# Patient Record
Sex: Male | Born: 1945 | Race: White | Hispanic: No | Marital: Married | Smoking: Current every day smoker
Health system: Southern US, Community
[De-identification: ages and names within clinical notes are randomized; demographics above are authoritative.]

## PROBLEM LIST (undated history)

## (undated) DIAGNOSIS — I2510 Atherosclerotic heart disease of native coronary artery without angina pectoris: Secondary | ICD-10-CM

## (undated) DIAGNOSIS — E785 Hyperlipidemia, unspecified: Secondary | ICD-10-CM

## (undated) DIAGNOSIS — R011 Cardiac murmur, unspecified: Secondary | ICD-10-CM

## (undated) DIAGNOSIS — I739 Peripheral vascular disease, unspecified: Secondary | ICD-10-CM

## (undated) DIAGNOSIS — R42 Dizziness and giddiness: Secondary | ICD-10-CM

## (undated) DIAGNOSIS — I10 Essential (primary) hypertension: Secondary | ICD-10-CM

## (undated) DIAGNOSIS — J449 Chronic obstructive pulmonary disease, unspecified: Secondary | ICD-10-CM

## (undated) DIAGNOSIS — R251 Tremor, unspecified: Secondary | ICD-10-CM

## (undated) DIAGNOSIS — M1990 Unspecified osteoarthritis, unspecified site: Secondary | ICD-10-CM

## (undated) DIAGNOSIS — T8859XA Other complications of anesthesia, initial encounter: Secondary | ICD-10-CM

## (undated) DIAGNOSIS — C61 Malignant neoplasm of prostate: Secondary | ICD-10-CM

## (undated) DIAGNOSIS — G629 Polyneuropathy, unspecified: Secondary | ICD-10-CM

## (undated) DIAGNOSIS — L570 Actinic keratosis: Secondary | ICD-10-CM

## (undated) HISTORY — DX: Actinic keratosis: L57.0

## (undated) HISTORY — DX: Dizziness and giddiness: R42

## (undated) HISTORY — DX: Tremor, unspecified: R25.1

## (undated) HISTORY — DX: Atherosclerotic heart disease of native coronary artery without angina pectoris: I25.10

## (undated) HISTORY — DX: Essential (primary) hypertension: I10

## (undated) HISTORY — PX: CORONARY ARTERY BYPASS GRAFT: SHX141

## (undated) HISTORY — DX: Unspecified osteoarthritis, unspecified site: M19.90

## (undated) HISTORY — DX: Peripheral vascular disease, unspecified: I73.9

## (undated) HISTORY — DX: Hyperlipidemia, unspecified: E78.5

## (undated) HISTORY — DX: Chronic obstructive pulmonary disease, unspecified: J44.9

## (undated) HISTORY — DX: Malignant neoplasm of prostate: C61

---

## 1998-11-06 ENCOUNTER — Ambulatory Visit (HOSPITAL_COMMUNITY): Admission: RE | Admit: 1998-11-06 | Discharge: 1998-11-06 | Payer: Self-pay | Admitting: Gastroenterology

## 1998-11-06 ENCOUNTER — Encounter: Payer: Self-pay | Admitting: Gastroenterology

## 2000-11-19 ENCOUNTER — Emergency Department (HOSPITAL_COMMUNITY): Admission: EM | Admit: 2000-11-19 | Discharge: 2000-11-19 | Payer: Self-pay | Admitting: Emergency Medicine

## 2000-11-19 ENCOUNTER — Encounter: Payer: Self-pay | Admitting: Emergency Medicine

## 2002-04-26 ENCOUNTER — Ambulatory Visit (HOSPITAL_COMMUNITY): Admission: RE | Admit: 2002-04-26 | Discharge: 2002-04-26 | Payer: Self-pay | Admitting: Pulmonary Disease

## 2002-04-26 ENCOUNTER — Encounter: Payer: Self-pay | Admitting: Pulmonary Disease

## 2002-06-20 ENCOUNTER — Inpatient Hospital Stay (HOSPITAL_COMMUNITY): Admission: AD | Admit: 2002-06-20 | Discharge: 2002-07-02 | Payer: Self-pay | Admitting: Internal Medicine

## 2002-06-20 ENCOUNTER — Encounter: Payer: Self-pay | Admitting: Internal Medicine

## 2002-06-27 ENCOUNTER — Encounter: Payer: Self-pay | Admitting: Cardiothoracic Surgery

## 2002-06-28 ENCOUNTER — Encounter: Payer: Self-pay | Admitting: Cardiothoracic Surgery

## 2002-06-29 ENCOUNTER — Encounter: Payer: Self-pay | Admitting: Cardiothoracic Surgery

## 2002-06-30 ENCOUNTER — Encounter: Payer: Self-pay | Admitting: Cardiothoracic Surgery

## 2002-10-02 ENCOUNTER — Emergency Department (HOSPITAL_COMMUNITY): Admission: EM | Admit: 2002-10-02 | Discharge: 2002-10-03 | Payer: Self-pay | Admitting: Emergency Medicine

## 2002-10-03 ENCOUNTER — Encounter: Payer: Self-pay | Admitting: Emergency Medicine

## 2002-10-19 ENCOUNTER — Encounter: Payer: Self-pay | Admitting: Emergency Medicine

## 2002-10-19 ENCOUNTER — Emergency Department (HOSPITAL_COMMUNITY): Admission: EM | Admit: 2002-10-19 | Discharge: 2002-10-19 | Payer: Self-pay | Admitting: Emergency Medicine

## 2003-12-15 ENCOUNTER — Ambulatory Visit (HOSPITAL_COMMUNITY): Admission: RE | Admit: 2003-12-15 | Discharge: 2003-12-15 | Payer: Self-pay | Admitting: Internal Medicine

## 2003-12-28 ENCOUNTER — Encounter: Admission: RE | Admit: 2003-12-28 | Discharge: 2003-12-28 | Payer: Self-pay | Admitting: Gastroenterology

## 2004-01-05 ENCOUNTER — Emergency Department (HOSPITAL_COMMUNITY): Admission: EM | Admit: 2004-01-05 | Discharge: 2004-01-05 | Payer: Self-pay | Admitting: Emergency Medicine

## 2004-01-31 ENCOUNTER — Ambulatory Visit (HOSPITAL_COMMUNITY): Admission: RE | Admit: 2004-01-31 | Discharge: 2004-01-31 | Payer: Self-pay | Admitting: Gastroenterology

## 2004-02-15 ENCOUNTER — Ambulatory Visit (HOSPITAL_COMMUNITY): Admission: RE | Admit: 2004-02-15 | Discharge: 2004-02-16 | Payer: Self-pay | Admitting: Neurosurgery

## 2004-03-04 ENCOUNTER — Encounter: Admission: RE | Admit: 2004-03-04 | Discharge: 2004-03-04 | Payer: Self-pay | Admitting: Neurosurgery

## 2004-07-07 ENCOUNTER — Ambulatory Visit: Payer: Self-pay | Admitting: Internal Medicine

## 2005-02-18 ENCOUNTER — Ambulatory Visit: Payer: Self-pay | Admitting: Cardiology

## 2005-06-24 ENCOUNTER — Ambulatory Visit: Payer: Self-pay | Admitting: Gastroenterology

## 2005-07-21 ENCOUNTER — Encounter (INDEPENDENT_AMBULATORY_CARE_PROVIDER_SITE_OTHER): Payer: Self-pay | Admitting: Specialist

## 2005-07-21 ENCOUNTER — Ambulatory Visit: Payer: Self-pay | Admitting: Gastroenterology

## 2005-08-05 ENCOUNTER — Ambulatory Visit: Payer: Self-pay | Admitting: Cardiology

## 2005-10-01 ENCOUNTER — Ambulatory Visit: Payer: Self-pay | Admitting: Endocrinology

## 2005-10-12 ENCOUNTER — Ambulatory Visit: Payer: Self-pay | Admitting: Internal Medicine

## 2005-10-21 ENCOUNTER — Ambulatory Visit: Payer: Self-pay | Admitting: Internal Medicine

## 2006-02-10 ENCOUNTER — Ambulatory Visit: Payer: Self-pay | Admitting: Internal Medicine

## 2006-02-25 ENCOUNTER — Ambulatory Visit: Payer: Self-pay | Admitting: Internal Medicine

## 2006-03-20 ENCOUNTER — Emergency Department (HOSPITAL_COMMUNITY): Admission: EM | Admit: 2006-03-20 | Discharge: 2006-03-20 | Payer: Self-pay | Admitting: Emergency Medicine

## 2006-04-08 ENCOUNTER — Ambulatory Visit: Payer: Self-pay | Admitting: Internal Medicine

## 2006-07-01 ENCOUNTER — Ambulatory Visit: Payer: Self-pay | Admitting: Internal Medicine

## 2006-08-11 ENCOUNTER — Ambulatory Visit: Payer: Self-pay | Admitting: Cardiology

## 2006-10-04 ENCOUNTER — Ambulatory Visit: Payer: Self-pay | Admitting: Internal Medicine

## 2007-01-05 ENCOUNTER — Ambulatory Visit: Payer: Self-pay | Admitting: Internal Medicine

## 2007-01-05 ENCOUNTER — Ambulatory Visit: Payer: Self-pay | Admitting: Gastroenterology

## 2007-01-05 LAB — CONVERTED CEMR LAB
AST: 21 units/L (ref 0–37)
BUN: 9 mg/dL (ref 6–23)
CO2: 29 meq/L (ref 19–32)
Calcium: 9.5 mg/dL (ref 8.4–10.5)
Chloride: 109 meq/L (ref 96–112)
Cholesterol: 242 mg/dL (ref 0–200)
Creatinine, Ser: 1 mg/dL (ref 0.4–1.5)
Direct LDL: 184.3 mg/dL
GFR calc non Af Amer: 82 mL/min
HDL: 33.9 mg/dL — ABNORMAL LOW (ref >39.0)
PSA: 1.88 ng/mL (ref 0.10–4.00)
Potassium: 4.6 meq/L (ref 3.5–5.1)
TSH: 1.04 microintl units/mL (ref 0.35–5.50)
Triglycerides: 180 mg/dL — ABNORMAL HIGH (ref 0–149)
VLDL: 36 mg/dL (ref 0–40)

## 2007-01-07 ENCOUNTER — Ambulatory Visit: Payer: Self-pay | Admitting: Internal Medicine

## 2007-04-27 ENCOUNTER — Ambulatory Visit: Payer: Self-pay | Admitting: Cardiology

## 2007-05-04 ENCOUNTER — Ambulatory Visit: Payer: Self-pay

## 2007-05-11 ENCOUNTER — Encounter: Payer: Self-pay | Admitting: *Deleted

## 2007-05-11 DIAGNOSIS — J309 Allergic rhinitis, unspecified: Secondary | ICD-10-CM | POA: Insufficient documentation

## 2007-05-11 DIAGNOSIS — J449 Chronic obstructive pulmonary disease, unspecified: Secondary | ICD-10-CM

## 2007-05-11 DIAGNOSIS — M1990 Unspecified osteoarthritis, unspecified site: Secondary | ICD-10-CM | POA: Insufficient documentation

## 2007-05-16 ENCOUNTER — Ambulatory Visit: Payer: Self-pay | Admitting: Internal Medicine

## 2007-05-18 ENCOUNTER — Ambulatory Visit: Payer: Self-pay

## 2007-06-30 ENCOUNTER — Ambulatory Visit: Payer: Self-pay | Admitting: Internal Medicine

## 2007-06-30 ENCOUNTER — Telehealth: Payer: Self-pay | Admitting: Internal Medicine

## 2007-06-30 DIAGNOSIS — L570 Actinic keratosis: Secondary | ICD-10-CM

## 2007-06-30 DIAGNOSIS — J209 Acute bronchitis, unspecified: Secondary | ICD-10-CM

## 2007-09-12 ENCOUNTER — Ambulatory Visit: Payer: Self-pay | Admitting: Internal Medicine

## 2007-09-12 DIAGNOSIS — R0989 Other specified symptoms and signs involving the circulatory and respiratory systems: Secondary | ICD-10-CM

## 2007-09-12 DIAGNOSIS — G250 Essential tremor: Secondary | ICD-10-CM

## 2007-09-12 DIAGNOSIS — I739 Peripheral vascular disease, unspecified: Secondary | ICD-10-CM | POA: Insufficient documentation

## 2007-09-12 DIAGNOSIS — G5600 Carpal tunnel syndrome, unspecified upper limb: Secondary | ICD-10-CM

## 2007-09-12 DIAGNOSIS — R0609 Other forms of dyspnea: Secondary | ICD-10-CM

## 2007-09-12 DIAGNOSIS — R42 Dizziness and giddiness: Secondary | ICD-10-CM

## 2007-09-12 DIAGNOSIS — I2510 Atherosclerotic heart disease of native coronary artery without angina pectoris: Secondary | ICD-10-CM

## 2007-10-10 ENCOUNTER — Ambulatory Visit: Payer: Self-pay | Admitting: Internal Medicine

## 2007-10-10 DIAGNOSIS — F411 Generalized anxiety disorder: Secondary | ICD-10-CM | POA: Insufficient documentation

## 2007-10-28 ENCOUNTER — Ambulatory Visit: Payer: Self-pay | Admitting: Cardiology

## 2007-11-09 ENCOUNTER — Ambulatory Visit: Payer: Self-pay

## 2007-12-12 ENCOUNTER — Encounter (INDEPENDENT_AMBULATORY_CARE_PROVIDER_SITE_OTHER): Payer: Self-pay | Admitting: *Deleted

## 2007-12-12 ENCOUNTER — Ambulatory Visit: Payer: Self-pay | Admitting: Internal Medicine

## 2007-12-12 DIAGNOSIS — Z860100 Personal history of colon polyps, unspecified: Secondary | ICD-10-CM | POA: Insufficient documentation

## 2008-01-02 ENCOUNTER — Ambulatory Visit: Payer: Self-pay | Admitting: Internal Medicine

## 2008-01-02 DIAGNOSIS — K625 Hemorrhage of anus and rectum: Secondary | ICD-10-CM

## 2008-01-02 DIAGNOSIS — R198 Other specified symptoms and signs involving the digestive system and abdomen: Secondary | ICD-10-CM

## 2008-01-24 ENCOUNTER — Encounter: Payer: Self-pay | Admitting: Internal Medicine

## 2008-01-24 ENCOUNTER — Ambulatory Visit: Payer: Self-pay | Admitting: Internal Medicine

## 2008-01-29 ENCOUNTER — Encounter: Payer: Self-pay | Admitting: Internal Medicine

## 2008-03-02 ENCOUNTER — Encounter: Payer: Self-pay | Admitting: Internal Medicine

## 2008-03-09 ENCOUNTER — Ambulatory Visit: Payer: Self-pay | Admitting: Internal Medicine

## 2008-03-09 LAB — CONVERTED CEMR LAB
ALT: 28 units/L (ref 0–53)
AST: 23 units/L (ref 0–37)
Albumin: 3.8 g/dL (ref 3.5–5.2)
Basophils Absolute: 0.1 10*3/uL (ref 0.0–0.1)
Basophils Relative: 1.1 % (ref 0.0–3.0)
Bilirubin Urine: NEGATIVE
Chloride: 103 meq/L (ref 96–112)
Eosinophils Absolute: 0.3 10*3/uL (ref 0.0–0.7)
Eosinophils Relative: 3.8 % (ref 0.0–5.0)
Folate: 7.2 ng/mL
GFR calc Af Amer: 88 mL/min
GFR calc non Af Amer: 73 mL/min
HDL: 32.1 mg/dL — ABNORMAL LOW (ref >39.0)
LDL Cholesterol: 141 mg/dL — ABNORMAL HIGH (ref 0–99)
Lymphocytes Relative: 32.4 % (ref 12.0–46.0)
MCV: 91.9 fL (ref 78.0–100.0)
Monocytes Relative: 8.2 % (ref 3.0–12.0)
Neutrophils Relative %: 54.5 % (ref 43.0–77.0)
Nitrite: NEGATIVE
RBC: 4.91 M/uL (ref 4.22–5.81)
RDW: 12.3 % (ref 11.5–14.6)
Sodium: 137 meq/L (ref 135–145)
Specific Gravity, Urine: >=1.03 (ref 1.000–1.03)
TSH: 0.44 microintl units/mL (ref 0.35–5.50)
Total Bilirubin: 1.1 mg/dL (ref 0.3–1.2)
Total Protein: 6.9 g/dL (ref 6.0–8.3)
Triglycerides: 103 mg/dL (ref 0–149)
Urine Glucose: NEGATIVE mg/dL
Urobilinogen, UA: 0.2 (ref 0.0–1.0)
Vit D, 1,25-Dihydroxy: 25 — ABNORMAL LOW (ref 30–89)

## 2008-03-13 ENCOUNTER — Ambulatory Visit: Payer: Self-pay | Admitting: Internal Medicine

## 2008-03-13 DIAGNOSIS — E559 Vitamin D deficiency, unspecified: Secondary | ICD-10-CM | POA: Insufficient documentation

## 2008-03-13 DIAGNOSIS — M2550 Pain in unspecified joint: Secondary | ICD-10-CM

## 2008-06-18 ENCOUNTER — Ambulatory Visit: Payer: Self-pay | Admitting: Internal Medicine

## 2008-06-20 ENCOUNTER — Ambulatory Visit: Payer: Self-pay | Admitting: Cardiology

## 2008-06-21 LAB — CONVERTED CEMR LAB
Calcium: 8.9 mg/dL (ref 8.4–10.5)
Chloride: 106 meq/L (ref 96–112)
Creatinine, Ser: 1.1 mg/dL (ref 0.4–1.5)
Sodium: 139 meq/L (ref 135–145)

## 2008-10-15 ENCOUNTER — Ambulatory Visit: Payer: Self-pay | Admitting: Internal Medicine

## 2008-10-15 DIAGNOSIS — I10 Essential (primary) hypertension: Secondary | ICD-10-CM

## 2008-10-17 LAB — CONVERTED CEMR LAB
Basophils Relative: 0.6 % (ref 0.0–3.0)
CO2: 27 meq/L (ref 19–32)
Creatinine, Ser: 1 mg/dL (ref 0.4–1.5)
Eosinophils Relative: 2.8 % (ref 0.0–5.0)
GFR calc non Af Amer: 81 mL/min
Glucose, Bld: 104 mg/dL — ABNORMAL HIGH (ref 70–99)
HCT: 44.4 % (ref 39.0–52.0)
Hemoglobin: 15.7 g/dL (ref 13.0–17.0)
Lymphocytes Relative: 33.7 % (ref 12.0–46.0)
MCHC: 35.5 g/dL (ref 30.0–36.0)
MCV: 93 fL (ref 78.0–100.0)
Monocytes Relative: 6.9 % (ref 3.0–12.0)
Neutrophils Relative %: 56 % (ref 43.0–77.0)
RBC: 4.77 M/uL (ref 4.22–5.81)
RDW: 12.5 % (ref 11.5–14.6)
Sodium: 137 meq/L (ref 135–145)
Vitamin B-12: 291 pg/mL (ref 211–911)

## 2008-10-23 ENCOUNTER — Encounter: Payer: Self-pay | Admitting: Internal Medicine

## 2009-01-17 ENCOUNTER — Telehealth: Payer: Self-pay | Admitting: Cardiology

## 2009-02-18 ENCOUNTER — Ambulatory Visit: Payer: Self-pay | Admitting: Internal Medicine

## 2009-02-18 LAB — CONVERTED CEMR LAB
ALT: 25 units/L (ref 0–53)
Chloride: 108 meq/L (ref 96–112)
Cholesterol: 191 mg/dL (ref 0–200)
GFR calc non Af Amer: 80.84 mL/min (ref >60)
Glucose, Bld: 102 mg/dL — ABNORMAL HIGH (ref 70–99)
LDL Cholesterol: 136 mg/dL — ABNORMAL HIGH (ref 0–99)
Potassium: 4.2 meq/L (ref 3.5–5.1)
Total CHOL/HDL Ratio: 6
VLDL: 21.2 mg/dL (ref 0.0–40.0)
Vit D, 25-Hydroxy: 33 ng/mL (ref 30–89)

## 2009-02-21 ENCOUNTER — Ambulatory Visit: Payer: Self-pay | Admitting: Internal Medicine

## 2009-02-21 DIAGNOSIS — E538 Deficiency of other specified B group vitamins: Secondary | ICD-10-CM

## 2009-07-03 ENCOUNTER — Ambulatory Visit: Payer: Self-pay | Admitting: Internal Medicine

## 2009-07-03 LAB — CONVERTED CEMR LAB
Albumin: 3.8 g/dL (ref 3.5–5.2)
Alkaline Phosphatase: 55 units/L (ref 39–117)
BUN: 8 mg/dL (ref 6–23)
CO2: 24 meq/L (ref 19–32)
Calcium: 8.9 mg/dL (ref 8.4–10.5)
GFR calc non Af Amer: 80.74 mL/min (ref >60)
Sodium: 137 meq/L (ref 135–145)
TSH: 0.36 microintl units/mL (ref 0.35–5.50)

## 2009-07-08 ENCOUNTER — Ambulatory Visit: Payer: Self-pay | Admitting: Internal Medicine

## 2009-07-08 DIAGNOSIS — Z87891 Personal history of nicotine dependence: Secondary | ICD-10-CM

## 2009-10-04 ENCOUNTER — Ambulatory Visit: Payer: Self-pay | Admitting: Internal Medicine

## 2009-10-04 LAB — CONVERTED CEMR LAB
ALT: 27 units/L (ref 0–53)
Albumin: 4.1 g/dL (ref 3.5–5.2)
Creatinine, Ser: 0.9 mg/dL (ref 0.4–1.5)
Eosinophils Relative: 2.6 % (ref 0.0–5.0)
Glucose, Bld: 93 mg/dL (ref 70–99)
HCT: 46.5 % (ref 39.0–52.0)
HDL: 40.4 mg/dL (ref >39.00)
Hemoglobin: 16.2 g/dL (ref 13.0–17.0)
MCHC: 34.7 g/dL (ref 30.0–36.0)
MCV: 94.9 fL (ref 78.0–100.0)
Monocytes Relative: 6.4 % (ref 3.0–12.0)
Neutrophils Relative %: 54.4 % (ref 43.0–77.0)
Platelets: 260 10*3/uL (ref 150.0–400.0)
RBC: 4.9 M/uL (ref 4.22–5.81)
Sodium: 137 meq/L (ref 135–145)
Total Bilirubin: 1.1 mg/dL (ref 0.3–1.2)
Total Protein: 7 g/dL (ref 6.0–8.3)
Triglycerides: 93 mg/dL (ref 0.0–149.0)
VLDL: 18.6 mg/dL (ref 0.0–40.0)
WBC: 6 10*3/uL (ref 4.5–10.5)

## 2009-10-07 ENCOUNTER — Ambulatory Visit: Payer: Self-pay | Admitting: Internal Medicine

## 2009-10-07 DIAGNOSIS — E291 Testicular hypofunction: Secondary | ICD-10-CM | POA: Insufficient documentation

## 2010-01-08 ENCOUNTER — Ambulatory Visit: Payer: Self-pay | Admitting: Internal Medicine

## 2010-01-09 LAB — CONVERTED CEMR LAB
ALT: 28 units/L (ref 0–53)
AST: 19 units/L (ref 0–37)
Alkaline Phosphatase: 50 units/L (ref 39–117)
Bilirubin, Direct: 0.1 mg/dL (ref 0.0–0.3)
CO2: 27 meq/L (ref 19–32)
Creatinine, Ser: 1 mg/dL (ref 0.4–1.5)
Hgb A1c MFr Bld: 5.7 % (ref 4.6–6.5)
Potassium: 4.7 meq/L (ref 3.5–5.1)
Sodium: 140 meq/L (ref 135–145)
TSH: 1.23 microintl units/mL (ref 0.35–5.50)
Total Bilirubin: 0.5 mg/dL (ref 0.3–1.2)
Total Protein: 6.8 g/dL (ref 6.0–8.3)
Vitamin B-12: 472 pg/mL (ref 211–911)

## 2010-05-05 ENCOUNTER — Ambulatory Visit: Payer: Self-pay | Admitting: Internal Medicine

## 2010-05-05 LAB — CONVERTED CEMR LAB
ALT: 29 units/L (ref 0–53)
AST: 24 units/L (ref 0–37)
Albumin: 4.1 g/dL (ref 3.5–5.2)
Alkaline Phosphatase: 49 units/L (ref 39–117)
BUN: 14 mg/dL (ref 6–23)
Basophils Absolute: 0 10*3/uL (ref 0.0–0.1)
Basophils Relative: 0.6 % (ref 0.0–3.0)
Bilirubin, Direct: 0.1 mg/dL (ref 0.0–0.3)
CO2: 28 meq/L (ref 19–32)
Calcium: 9.8 mg/dL (ref 8.4–10.5)
Chloride: 104 meq/L (ref 96–112)
Creatinine, Ser: 1 mg/dL (ref 0.4–1.5)
Eosinophils Absolute: 0.2 10*3/uL (ref 0.0–0.7)
Eosinophils Relative: 2.8 % (ref 0.0–5.0)
GFR calc non Af Amer: 77.82 mL/min (ref >60)
Glucose, Bld: 93 mg/dL (ref 70–99)
HCT: 44.4 % (ref 39.0–52.0)
Hemoglobin: 15.8 g/dL (ref 13.0–17.0)
Lymphocytes Relative: 37.4 % (ref 12.0–46.0)
Lymphs Abs: 2.8 10*3/uL (ref 0.7–4.0)
MCHC: 35.4 g/dL (ref 30.0–36.0)
MCV: 94.3 fL (ref 78.0–100.0)
Monocytes Absolute: 0.6 10*3/uL (ref 0.1–1.0)
Monocytes Relative: 7.9 % (ref 3.0–12.0)
Neutro Abs: 3.8 10*3/uL (ref 1.4–7.7)
Neutrophils Relative %: 51.3 % (ref 43.0–77.0)
Platelets: 236 10*3/uL (ref 150.0–400.0)
Potassium: 4.8 meq/L (ref 3.5–5.1)
RBC: 4.72 M/uL (ref 4.22–5.81)
RDW: 13.6 % (ref 11.5–14.6)
Sodium: 139 meq/L (ref 135–145)
Total Bilirubin: 1 mg/dL (ref 0.3–1.2)
Total Protein: 6.9 g/dL (ref 6.0–8.3)
Vit D, 25-Hydroxy: 40 ng/mL (ref 30–89)
Vitamin B-12: 640 pg/mL (ref 211–911)
WBC: 7.4 10*3/uL (ref 4.5–10.5)

## 2010-06-08 ENCOUNTER — Encounter: Payer: Self-pay | Admitting: Internal Medicine

## 2010-06-11 ENCOUNTER — Telehealth: Payer: Self-pay | Admitting: Internal Medicine

## 2010-06-19 ENCOUNTER — Encounter: Payer: Self-pay | Admitting: Internal Medicine

## 2010-06-19 ENCOUNTER — Ambulatory Visit: Payer: Self-pay | Admitting: Internal Medicine

## 2010-06-19 DIAGNOSIS — R209 Unspecified disturbances of skin sensation: Secondary | ICD-10-CM

## 2010-06-19 DIAGNOSIS — R51 Headache: Secondary | ICD-10-CM | POA: Insufficient documentation

## 2010-06-19 DIAGNOSIS — M25539 Pain in unspecified wrist: Secondary | ICD-10-CM

## 2010-06-19 DIAGNOSIS — S060X9A Concussion with loss of consciousness of unspecified duration, initial encounter: Secondary | ICD-10-CM | POA: Insufficient documentation

## 2010-06-19 DIAGNOSIS — M79609 Pain in unspecified limb: Secondary | ICD-10-CM | POA: Insufficient documentation

## 2010-06-19 DIAGNOSIS — R519 Headache, unspecified: Secondary | ICD-10-CM | POA: Insufficient documentation

## 2010-06-19 DIAGNOSIS — M542 Cervicalgia: Secondary | ICD-10-CM | POA: Insufficient documentation

## 2010-06-20 ENCOUNTER — Encounter: Payer: Self-pay | Admitting: Internal Medicine

## 2010-06-24 ENCOUNTER — Encounter: Payer: Self-pay | Admitting: Internal Medicine

## 2010-07-16 ENCOUNTER — Ambulatory Visit: Payer: Self-pay | Admitting: Internal Medicine

## 2010-07-16 DIAGNOSIS — M25519 Pain in unspecified shoulder: Secondary | ICD-10-CM

## 2010-07-28 ENCOUNTER — Encounter: Payer: Self-pay | Admitting: Internal Medicine

## 2010-08-01 ENCOUNTER — Ambulatory Visit: Payer: Self-pay | Admitting: Internal Medicine

## 2010-08-08 ENCOUNTER — Ambulatory Visit: Payer: Self-pay | Admitting: Internal Medicine

## 2010-08-08 DIAGNOSIS — L259 Unspecified contact dermatitis, unspecified cause: Secondary | ICD-10-CM

## 2010-08-08 DIAGNOSIS — D485 Neoplasm of uncertain behavior of skin: Secondary | ICD-10-CM

## 2010-08-28 ENCOUNTER — Ambulatory Visit
Admission: RE | Admit: 2010-08-28 | Discharge: 2010-08-28 | Payer: Self-pay | Source: Home / Self Care | Attending: Internal Medicine | Admitting: Internal Medicine

## 2010-09-25 NOTE — Letter (Signed)
Summary: [facility]  [facility]   Imported By: Lennie Odor 07/09/2010 11:16:21  _____________________________________________________________________  External Attachment:    Type:   Image     Comment:   External Document
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
## 2010-09-25 NOTE — Assessment & Plan Note (Signed)
Summary: 3 MO ROV /NWS  #   Vital Signs:  Patient profile:   65 year old male Weight:      248 pounds Temp:     98 degrees F oral Pulse rate:   85 / minute BP sitting:   106 / 60  (left arm)  Vitals Entered By: Tora Perches (October 07, 2009 8:15 AM) CC: f/u Is Patient Diabetic? No   Primary Care Provider:  Ransom Nickson  CC:  f/u.  History of Present Illness: The patient presents for a follow up of hypertension, OA, hyperlipidemia   Preventive Screening-Counseling & Management  Alcohol-Tobacco     Smoking Status: quit  Current Medications (verified): 1)  Benicar 20 Mg  Tabs (Olmesartan Medoxomil) .... Take One Tablet Daily 2)  Aciphex 20 Mg  Tbec (Rabeprazole Sodium) .... Take One Tablet Once Daily 3)  Combivent 103-18 Mcg/act  Aero (Albuterol-Ipratropium) .... Use As Directed 4)  Zyrtec Allergy 10 Mg  Tabs (Cetirizine Hcl) .... Take As Needed 5)  Meclizine Hcl 25 Mg  Tabs (Meclizine Hcl) .... Take 1/2-3 Tabs By Mouth Qd 6)  Adult Aspirin Low Strength 81 Mg  Tbdp (Aspirin) .... Take 1 By Mouth Qd 7)  Diazepam 5 Mg Tabs (Diazepam) .... 1/2 or 1 By Mouth Two Times A Day - Three Times A Day As Needed Tremor or Insomnia 8)  Vitamin B-6 500 Mg Tabs (Pyridoxine Hcl) .... Once Daily 9)  Vitamin D3 1000 Unit  Tabs (Cholecalciferol) .[name].. 1 By Mouth Daily 10)  Norvasc 5 Mg Tabs (Amlodipine Besylate) .... Once Daily 11)  Vitamin B-12 1000 Mcg Tabs (Cyanocobalamin) .[name].. 1 By Mouth Qd 12)  Miralax   Powd (Polyethylene Glycol 3350) .[name].. 17g By Mouth Once Daily As Needed Constipation 13)  Ibuprofen 600 Mg  Tabs (Ibuprofen) .[name].. 1 By Mouth Two Times A Day As Needed Pain  Allergies: 1)  ! Pepcid 2)  ! * Nexium 3)  ! Ticlid 4)  ! * Flu Vaccination 5)  ! Axid 6)  ! Lipitor 7)  ! 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 OSA/snorring Colonic polyps, hx of  Dr Leone Payor Vit D def Low Vit B12 2009 Hypertension Migraines w/aura  Social History: Last updated: 01/02/2008 Retired Married Current Smoker Patient has been counseled to quit.  Alcohol Use - no  Physical Exam  General:  overweight-appearing.   Nose:  External nasal examination shows no deformity or inflammation. Nasal mucosa are pink and moist without lesions or exudates. Mouth:  Oral mucosa and oropharynx without lesions or exudates.  Teeth in good repair. Heart:  Normal rate and regular rhythm. S1 and S2 normal without gallop, murmur, click, rub or other extra sounds.   Impression & Recommendations:  Problem # 1:  HYPERTENSION (ICD-401.9)  His updated medication list for this problem includes:    Benicar 20 Mg Tabs (Olmesartan medoxomil) .[name]... Take one tablet daily    Norvasc 5 Mg Tabs (Amlodipine besylate) ..... Once daily  Problem # 2:  HYPOGONADISM (ICD-257.2) Assessment: Comment Only Discussed. Not interested in Rx now - will try to loose wt  Problem # 3:  ARTHRALGIA (ICD-719.40) Assessment: Unchanged  On prescription therapy   Problem # 4:  ANXIETY (ICD-300.00) Assessment: Unchanged  His updated medication list for this problem includes:    Diazepam 5 Mg Tabs (Diazepam) .[name]... 1/2 or 1 by mouth two times a day - three times a day as needed tremor or insomnia  Problem # 5:  OSTEOARTHRITIS (ICD-715.90) Assessment: Unchanged  His updated medication list for this problem includes:    Adult Aspirin Low Strength 81 Mg Tbdp (Aspirin) .[name]... Take 1 by mouth qd    Ibuprofen 600 Mg Tabs (Ibuprofen) .[name]... 1 by mouth two times a day as needed pain  Complete Medication List: 1)  Benicar 20 Mg Tabs (Olmesartan medoxomil) .... Take  one tablet daily 2)  Combivent 103-18 Mcg/act Aero (Albuterol-ipratropium) .... Use as directed 3)  Zyrtec Allergy 10 Mg Tabs (Cetirizine hcl) .... Take as needed 4)  Meclizine Hcl 25 Mg Tabs (Meclizine hcl) .... Take 1/2-3 tabs by mouth qd 5)  Adult Aspirin Low Strength 81 Mg Tbdp (Aspirin) .... Take 1 by mouth qd 6)  Diazepam 5 Mg Tabs (Diazepam) .... 1/2 or 1 by mouth two times a day - three times a day as needed tremor or insomnia 7)  Vitamin B-6 500 Mg Tabs (Pyridoxine hcl) .... Once daily 8)  Vitamin D3 1000 Unit Tabs (Cholecalciferol) .[name].. 1 by mouth daily 9)  Norvasc 5 Mg Tabs (Amlodipine besylate) .... Once daily 10)  Vitamin B-12 1000 Mcg Tabs (Cyanocobalamin) .[name].. 1 by mouth qd 11)  Miralax Powd (Polyethylene glycol 3350) .[name].. 17g by mouth once daily as needed constipation 12)  Ibuprofen 600 Mg Tabs (Ibuprofen) .[name].. 1 by mouth two times a day as needed pain 13)  Ranitidine Hcl 300 Mg Tabs (Ranitidine hcl) .[name].. 1 by mouth once daily for indigestion  Patient Instructions: 1)  Please schedule a follow-up appointment in 3 months. 2)  BMP prior to visit, ICD-9: 3)  testost 4)  Vit B12 266.20 995.20  780.79  Contraindications/Deferment of Procedures/Staging:    Test/Procedure: FLU VAX    Reason for deferment: patient declined     Test/Procedure: Pneumovax vaccine    Reason for deferment: patient declined  Prescriptions: RANITIDINE HCL 300 MG TABS (RANITIDINE HCL) 1 by mouth once daily for indigestion  #90 x 3   Entered and Authorized by:   Tresa Garter MD   Signed by:   Tresa Garter MD on 10/08/2009   Method used:   Electronically to        [facility]  [address] [phone]* (retail)       [address]       [place], [place]  [address]       Ph: [phone] or [phone]       Fax: [phone]   RxID:   [id number]
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
## 2010-09-25 NOTE — Assessment & Plan Note (Signed)
Summary: SKIN BX/NWS  #   Vital Signs:  Patient profile:   65 year old male Height:      69 inches Weight:      271 pounds BMI:     40.16 Temp:     97.8 degrees F oral Pulse rate:   80 / minute Pulse rhythm:   regular Resp:     16 per minute BP sitting:   140 / 60  (left arm) Cuff size:   regular  Vitals Entered By: Lanier Prude, CMA(AAMA) (August 28, 2010 9:23 AM)  Procedure Note Last Tetanus: Tdap (06/19/2010)  Mole Biopsy/Removal: The patient complains of changing mole. Indication: suspicious lesion Consent signed: yes  Procedure # 1: shave biopsy    Size (in cm): 0.5 x 0.4    Region: anterior    Location: under L eye    Comment: Risks including but not limited by incomplete procedure, bleeding, infection, recurrence were discussed with the patient. Consent form was signed. Tolerated well. Complicatons - none.     Instrument used: dermablade    Anesthesia: 0.5 ml 1% lidocaine w/epinephrine  Procedure # 2: shave biopsy    Size (in cm): 0.6 x 0.5    Region: lateral    Location: R temple    Comment: The wounds were treated aggressively w/a hyfercator    Instrument used: same    Anesthesia: 0.5 ml 1% lidocaine w/epinephrine  Cleaned and prepped with: alcohol and betadine Wound dressing: neosporin and bandaid Instructions: daily dressing changes  CC: multiple skin lesions Is Patient Diabetic? No   Primary Care Provider:  Liron Eissler  CC:  multiple skin lesions.  History of Present Illness: He is here for skin biopsy and cryo  Current Medications (verified): 1)  Benicar 20 Mg  Tabs (Olmesartan Medoxomil) .... Take One Tablet Daily 2)  Norvasc 5 Mg Tabs (Amlodipine Besylate) .... Once Daily 3)  Combivent 103-18 Mcg/act  Aero (Albuterol-Ipratropium) .... Use As Directed 4)  Zyrtec Allergy 10 Mg  Tabs (Cetirizine Hcl) .... Take As Needed 5)  Meclizine Hcl 25 Mg  Tabs (Meclizine Hcl) .... Take 1/2-3 Tabs By Mouth Qd 6)  Ranitidine Hcl 300 Mg Tabs (Ranitidine  Hcl) .[name].. 1 By Mouth Once Daily For Indigestion 7)  Adult Aspirin Low Strength 81 Mg  Tbdp (Aspirin) .... Take 1 By Mouth Qd 8)  Vitamin D3 1000 Unit  Tabs (Cholecalciferol) .[name].. 1 By Mouth Daily 9)  Vitamin B-12 1000 Mcg Tabs (Cyanocobalamin) .... 1.5 Tabs  By Mouth Once Daily 10)  Vitamin B-6 500 Mg Tabs (Pyridoxine Hcl) .... Once Daily 11)  Ibuprofen 600 Mg  Tabs (Ibuprofen) .[name].. 1 By Mouth Two Times A Day As Needed Pain 12)  Diazepam 5 Mg Tabs (Diazepam) .... 1/2 or 1 By Mouth Two Times A Day - Three Times A Day As Needed Tremor or Insomnia 13)  Tramadol Hcl 50 Mg Tabs (Tramadol Hcl) .[name].. 1 Once Daily As Needed Pain 14)  Amitiza 24 Mcg Caps (Lubiprostone) .[name].. 1 By Mouth Once Daily As Needed Constipation 15)  Triamcinolone Acetonide 0.5 % Crea (Triamcinolone Acetonide) .... Use Two Times A Day Prn  Allergies (verified): 1)  ! Pepcid 2)  ! * Nexium 3)  ! Ticlid 4)  ! * Flu Vaccination 5)  ! Axid 6)  ! Lipitor 7)  ! * Vytorin 8)  Allegra-D 24 Hour 9)  Codeine Phosphate (Codeine Phosphate) 10)  Nabumetone (Nabumetone) 11)  Primidone (Primidone) 12)  Hydrocodone 13)  Oxycodone Hcl  Physical Exam  General:  NAD, overweight-appearing.   Skin:  AKs on L hand, forearm and the scull skin 2 moles on face   Impression & Recommendations:  Problem # 1:  NEOPLASM OF UNCERTAIN BEHAVIOR OF SKIN (ICD-238.2) Assessment New  Orders: Shave Skin Lesion 0.6-1.0cm face/ears/eyelids/nose/lips/mm (11311) Shave Skin Lesion < 0.5cm face/ears/eyelids/nose/lips/mm (11310)  Problem # 2:  ACTINIC KERATOSIS (ICD-702.0) Assessment: New  Procedure: cryo Indication: AK(s) Risks incl. scar(s), incomplete removal, ect.  and benefits discussed      2 lesion(s) on L dorsal hand, 1 on L dorsal forearm and 2 on the scalp was/were treated with liqid N2 in usual fasion.  Tolerated well. Compl. none. Wound care instructions given.   Orders: Cryotherapy/Destruction benign or premalignant lesion (1st  lesion)  (17000) Cryotherapy/Destruction benign or premalignant lesion (2nd-14th lesions) (17003)  Complete Medication List: 1)  Benicar 20 Mg Tabs (Olmesartan medoxomil) .... Take one tablet daily 2)  Norvasc 5 Mg Tabs (Amlodipine besylate) .... Once daily 3)  Combivent 103-18 Mcg/act Aero (Albuterol-ipratropium) .... Use as directed 4)  Zyrtec Allergy 10 Mg Tabs (Cetirizine hcl) .... Take as needed 5)  Meclizine Hcl 25 Mg Tabs (Meclizine hcl) .... Take 1/2-3 tabs by mouth qd 6)  Ranitidine Hcl 300 Mg Tabs (Ranitidine hcl) .[name].. 1 by mouth once daily for indigestion 7)  Adult Aspirin Low Strength 81 Mg Tbdp (Aspirin) .... Take 1 by mouth qd 8)  Vitamin D3 1000 Unit Tabs (Cholecalciferol) .[name].. 1 by mouth daily 9)  Vitamin B-12 1000 Mcg Tabs (Cyanocobalamin) .... 1.5 tabs  by mouth once daily 10)  Vitamin B-6 500 Mg Tabs (Pyridoxine hcl) .... Once daily 11)  Ibuprofen 600 Mg Tabs (Ibuprofen) .[name].. 1 by mouth two times a day as needed pain 12)  Diazepam 5 Mg Tabs (Diazepam) .... 1/2 or 1 by mouth two times a day - three times a day as needed tremor or insomnia 13)  Tramadol Hcl 50 Mg Tabs (Tramadol hcl) .[name].. 1 once daily as needed pain 14)  Amitiza 24 Mcg Caps (Lubiprostone) .[name].. 1 by mouth once daily as needed constipation 15)  Triamcinolone Acetonide 0.5 % Crea (Triamcinolone acetonide) .... Use two times a day prn   Orders Added: 1)  Shave Skin Lesion 0.6-1.0cm face/ears/eyelids/nose/lips/mm [11311] 2)  Shave Skin Lesion < 0.5cm face/ears/eyelids/nose/lips/mm [11310] 3)  Cryotherapy/Destruction benign or premalignant lesion (1st lesion)  [17000] 4)  Cryotherapy/Destruction benign or premalignant lesion (2nd-14th lesions) [17003]
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
## 2010-09-25 NOTE — Progress Notes (Signed)
  Phone Note Call from Patient Call back at Home Phone [phone]   Summary of Call: Patient wants a call back, left vm regarding medical records request.  Initial call taken by: Lamar Sprinkles, CMA,  June 11, 2010 1:58 PM  Follow-up for Phone Call        Left detailed vm for pt that he must sign release form to have records sent Follow-up by: Lamar Sprinkles, CMA,  June 11, 2010 5:21 PM

## 2010-09-25 NOTE — Consult Note (Signed)
Summary: [facility]  [facility]   Imported By: Lester Strathmere 07/08/2010 10:18:27  _____________________________________________________________________  External Attachment:    Type:   Image     Comment:   External Document

## 2010-09-25 NOTE — Miscellaneous (Signed)
Summary: Multiple Skin Bx/[facility]  Multiple Skin Bx/[facility]   Imported By: Sherian Rein 09/02/2010 14:50:57  _____________________________________________________________________  External Attachment:    Type:   Image     Comment:   External Document

## 2010-09-25 NOTE — Assessment & Plan Note (Signed)
Summary: 3 MO ROV /NWS  #  RS'D PER PT/NWS   Vital Signs:  Patient profile:   65 year old male Height:      69 inches Weight:      258 pounds BMI:     38.24 O2 Sat:      94 % on Room air Temp:     97.3 degrees F oral Pulse rate:   74 / minute Pulse rhythm:   regular Resp:     16 per minute BP sitting:   108 / 66  (left arm) Cuff size:   regular  Vitals Entered By: Lanier Prude, CMA(AAMA) (May 05, 2010 7:58 AM)  O2 Flow:  Room air CC: f/u c/o constipation, joint pain and Lt knee swelling Is Patient Diabetic? No Comments pt is not taking Primidone 50 mg   Primary Care Provider:  Plotnikov  CC:  f/u c/o constipation and joint pain and Lt knee swelling.  History of Present Illness: The patient presents for a follow up of hypertension, OA, LBP, hyperlipidemia   Current Medications (verified): 1)  Benicar 20 Mg  Tabs (Olmesartan Medoxomil) .... Take One Tablet Daily 2)  Norvasc 5 Mg Tabs (Amlodipine Besylate) .... Once Daily 3)  Combivent 103-18 Mcg/act  Aero (Albuterol-Ipratropium) .... Use As Directed 4)  Zyrtec Allergy 10 Mg  Tabs (Cetirizine Hcl) .... Take As Needed 5)  Meclizine Hcl 25 Mg  Tabs (Meclizine Hcl) .... Take 1/2-3 Tabs By Mouth Qd 6)  Ranitidine Hcl 300 Mg Tabs (Ranitidine Hcl) .[name].. 1 By Mouth Once Daily For Indigestion 7)  Adult Aspirin Low Strength 81 Mg  Tbdp (Aspirin) .... Take 1 By Mouth Qd 8)  Vitamin D3 1000 Unit  Tabs (Cholecalciferol) .[name].. 1 By Mouth Daily 9)  Vitamin B-12 1000 Mcg Tabs (Cyanocobalamin) .... 1.5 Tabs  By Mouth Once Daily 10)  Vitamin B-6 500 Mg Tabs (Pyridoxine Hcl) .... Once Daily 11)  Ibuprofen 600 Mg  Tabs (Ibuprofen) .[name].. 1 By Mouth Two Times A Day As Needed Pain 12)  Diazepam 5 Mg Tabs (Diazepam) .... 1/2 or 1 By Mouth Two Times A Day - Three Times A Day As Needed Tremor or Insomnia 13)  Tramadol Hcl 50 Mg Tabs (Tramadol Hcl) .[name].. 1 Once Daily As Needed Pain 14)  Primidone 50 Mg Tabs (Primidone) .... Start With 1/2 Tab  At Bedtime, Then Take 1 At Bedtime If Tolerated For Tremor  Allergies (verified): 1)  ! Pepcid 2)  ! * Nexium 3)  ! Ticlid 4)  ! * Flu Vaccination 5)  ! Axid 6)  ! Lipitor 7)  ! 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 OSA/snorring Colonic polyps, hx of  Dr Leone Payor Vit D def Low Vit B12 2009 Hypertension Migraines w/aura Tremor  Social History: Last updated: 01/02/2008 Retired Married Current Smoker Patient has been counseled to quit.  Alcohol Use - no  Review of Systems  The patient denies fever, weight loss, weight gain, chest pain, abdominal pain, melena, and depression.    Physical Exam  General:  overweight-appearing.   Nose:  External nasal examination shows no deformity or inflammation. Nasal mucosa are pink and moist without lesions or exudates. Mouth:  Oral mucosa and oropharynx without lesions  or exudates.  Teeth in good repair. Lungs:  Normal respiratory effort, chest expands symmetrically. Lungs are clear to auscultation, no crackles or wheezes. Heart:  Normal rate and regular rhythm. S1 and S2 normal without gallop, murmur, click, rub or other extra sounds. Abdomen:  Bowel sounds positive,abdomen soft and non-tender without masses, organomegaly or hernias noted. Msk:  Lumbar-sacral spine is tender to palpation over paraspinal muscles and painfull with the ROM   B knees tender w/ROM Extremities:  No edema B Neurologic:  No cranial nerve deficits noted. Station and gait are normal. Plantar reflexes are down-going bilaterally. DTRs are symmetrical throughout. Sensory, motor and coordinative functions appear intact. Tremor is  present Skin:  Intact without suspicious lesions or rashes Psych:  Oriented X3.  normally interactive and good eye contact.     Impression & Recommendations:  Problem # 1:  HYPERTENSION (ICD-401.9) Assessment Improved  His updated medication list for this problem includes:    Benicar 20 Mg Tabs (Olmesartan medoxomil) .[name]... Take one tablet daily    Norvasc 5 Mg Tabs (Amlodipine besylate) ..... Once daily  BP today: 108/66 Prior BP: 142/72 (01/08/2010)  Labs Reviewed: K+: 4.7 (01/08/2010) Creat: : 1.0 (01/08/2010)   Chol: 206 (10/04/2009)   HDL: 40.40 (10/04/2009)   LDL: 136 (02/18/2009)   TG: 93.0 (10/04/2009)  Orders: T-Vitamin D (25-Hydroxy) (63875-64332) TLB-B12, Serum-Total ONLY (95188-C16) TLB-BMP (Basic Metabolic Panel-BMET) (80048-METABOL) TLB-CBC Platelet - w/Differential (85025-CBCD) TLB-Hepatic/Liver Function Pnl (80076-HEPATIC)  Problem # 2:  VITAMIN B12 DEFICIENCY (ICD-266.2) Assessment: Unchanged  On the regimen of medicine(s) reflected in the chart    Orders: T-Vitamin D (25-Hydroxy) (60630-16010) TLB-B12, Serum-Total ONLY (93235-T73) TLB-BMP (Basic Metabolic Panel-BMET) (80048-METABOL) TLB-CBC Platelet - w/Differential (85025-CBCD) TLB-Hepatic/Liver Function Pnl (80076-HEPATIC)  Problem # 3:  ARTHRALGIA (ICD-719.40) Assessment: Unchanged On the regimen of medicine(s) reflected in the chart    Problem # 4:  CORONARY ARTERY DISEASE (ICD-414.00) Assessment: Unchanged  His updated medication list for this problem includes:    Benicar 20 Mg Tabs (Olmesartan medoxomil) .[name]... Take one tablet daily    Norvasc 5 Mg Tabs (Amlodipine besylate) ..... Once daily    Adult Aspirin Low Strength 81 Mg Tbdp (Aspirin) .[name]... Take 1 by mouth qd  Problem # 5:  TREMOR, ESSENTIAL (ICD-333.1) Assessment: Unchanged D/c Primidone de to side effects  Complete Medication List: 1)  Benicar 20 Mg Tabs (Olmesartan medoxomil) .... Take one tablet daily 2)  Norvasc 5 Mg Tabs  (Amlodipine besylate) .... Once daily 3)  Combivent 103-18 Mcg/act Aero (Albuterol-ipratropium) .... Use as directed 4)  Zyrtec Allergy 10 Mg Tabs (Cetirizine hcl) .... Take as needed 5)  Meclizine Hcl 25 Mg Tabs (Meclizine hcl) .... Take 1/2-3 tabs by mouth qd 6)  Ranitidine Hcl 300 Mg Tabs (Ranitidine hcl) .[name].. 1 by mouth once daily for indigestion 7)  Adult Aspirin Low Strength 81 Mg Tbdp (Aspirin) .... Take 1 by mouth qd 8)  Vitamin D3 1000 Unit Tabs (Cholecalciferol) .[name].. 1 by mouth daily 9)  Vitamin B-12 1000 Mcg Tabs (Cyanocobalamin) .... 1.5 tabs  by mouth once daily 10)  Vitamin B-6 500 Mg Tabs (Pyridoxine hcl) .... Once daily 11)  Ibuprofen 600 Mg Tabs (Ibuprofen) .[name].. 1 by mouth two times a day as needed pain 12)  Diazepam 5 Mg Tabs (Diazepam) .... 1/2 or 1 by mouth two times a day - three times a day as needed tremor or insomnia 13)  Tramadol Hcl 50 Mg Tabs (Tramadol hcl) .[name].. 1 once daily as needed pain  Other Orders: Flu Vaccine 30yrs + MEDICARE PATIENTS (U9811) Administration Flu vaccine - MCR (B1478)  Patient Instructions: 1)  Please schedule a follow-up appointment in 3 months well w/labs.  Marland Kitchenlbmedflu   Flu Vaccine Consent Questions     Do you have a history of severe allergic reactions to this vaccine? no    Any prior history of allergic reactions to egg and/or gelatin? no    Do you have a sensitivity to the preservative Thimersol? no    Do you have a past history of Guillan-Barre Syndrome? no    Do you currently have an acute febrile illness? no    Have you ever had a severe reaction to latex? no    Vaccine information given and explained to patient? yes    Are you currently pregnant? no    Lot Number:[id number]   Exp Date:02/21/2011   Site: Left Deltoid IM Lanier Prude, [facility])  May 05, 2010 8:36 AM

## 2010-09-25 NOTE — Assessment & Plan Note (Signed)
Summary: 3 MTH FU  STC   Vital Signs:  Patient profile:   65 year old male Weight:      258 pounds BMI:     38.24 Pulse rate:   62 / minute BP sitting:   142 / 72  (left arm) Cuff size:   regular  Vitals Entered By: Lamar Sprinkles, CMA (Jan 08, 2010 8:02 AM) CC: 3 mth f/u. [place]are[place] Seat off a house recently, only c/o soreness and bruising. Also had "food posioning" w/in the past week. Does not take valium, wants to talk about xanax.   Comments Pt states he has rx for tramadol, causes sleepyness but takes as needed pain. Tramadol removed from allergy list.   Primary Care Provider:  Javier Mamone  CC:  3 mth f/u. [place] Seat off a house recently, only c/o soreness and bruising. Also had "food posioning" w/in the past week. Does not take valium, and wants to talk about xanax.  [name]  History of Present Illness: The patient presents for a follow up of hypertension, diabetes, hyperlipidemia C/o fall 16 ft 2 wks ago cleaning gutters - hit his back and head - no LOC He had food poisoning the other wk C/o CTS and trembling  Current Medications (verified): 1)  Benicar 20 Mg  Tabs (Olmesartan Medoxomil) .... Take One Tablet Daily 2)  Norvasc 5 Mg Tabs (Amlodipine Besylate) .... Once Daily 3)  Combivent 103-18 Mcg/act  Aero (Albuterol-Ipratropium) .... Use As Directed 4)  Zyrtec Allergy 10 Mg  Tabs (Cetirizine Hcl) .... Take As Needed 5)  Meclizine Hcl 25 Mg  Tabs (Meclizine Hcl) .... Take 1/2-3 Tabs By Mouth Qd 6)  Ranitidine Hcl 300 Mg Tabs (Ranitidine Hcl) .[name].. 1 By Mouth Once Daily For Indigestion 7)  Adult Aspirin Low Strength 81 Mg  Tbdp (Aspirin) .... Take 1 By Mouth Qd 8)  Vitamin D3 1000 Unit  Tabs (Cholecalciferol) .[name].. 1 By Mouth Daily 9)  Vitamin B-12 1000 Mcg Tabs (Cyanocobalamin) .... 1.5 Tabs  By Mouth Once Daily 10)  Vitamin B-6 500 Mg Tabs (Pyridoxine Hcl) .... Once Daily 11)  Ibuprofen 600 Mg  Tabs (Ibuprofen) .[name].. 1 By Mouth Two Times A Day As Needed Pain 12)  Diazepam 5 Mg Tabs (Diazepam) ....  1/2 or 1 By Mouth Two Times A Day - Three Times A Day As Needed Tremor or Insomnia 13)  Tramadol Hcl 50 Mg Tabs (Tramadol Hcl) .[name].. 1 Once Daily As Needed Pain  Allergies: 1)  ! Pepcid 2)  ! * Nexium 3)  ! Ticlid 4)  ! * Flu Vaccination 5)  ! Axid 6)  ! Lipitor 7)  ! * Vytorin 8)  Allegra-D 24 Hour (Fexofenadine-Pseudoephedrine) 9)  Codeine Phosphate (Codeine Phosphate) 10)  Nabumetone (Nabumetone)  Past History:  Social History: Last updated: 01/02/2008 Retired Married Current Smoker Patient has been counseled to quit.  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 OSA/snorring Colonic polyps, hx of  Dr Leone Payor Vit D def Low Vit B12 2009 Hypertension Migraines w/aura Tremor  Review of Systems       The patient complains of weight gain.  The patient denies fever and abdominal pain.    Physical Exam  General:  overweight-appearing.   Nose:  External nasal examination shows no deformity or inflammation. Nasal mucosa are pink and moist without lesions or exudates. Mouth:  Oral mucosa and oropharynx  without lesions or exudates.  Teeth in good repair. Lungs:  Normal respiratory effort, chest expands symmetrically. Lungs are clear to auscultation, no crackles or wheezes. Heart:  Normal rate and regular rhythm. S1 and S2 normal without gallop, murmur, click, rub or other extra sounds. Abdomen:  Bowel sounds positive,abdomen soft and non-tender without masses, organomegaly or hernias noted. Msk:  No deformity or scoliosis noted of thoracic or lumbar spine.  Lumbar-sacral spine is tender to palpation over paraspinal muscles and painfull with the ROM  B knees tender w/ROM Neurologic:  No cranial nerve deficits noted. Station and gait are normal. Plantar reflexes are down-going bilaterally. DTRs are symmetrical  throughout. Sensory, motor and coordinative functions appear intact. Skin:  Intact without suspicious lesions or rashes Psych:  Oriented X3.  normally interactive and good eye contact.     Impression & Recommendations:  Problem # 1:  VITAMIN B12 DEFICIENCY (ICD-266.2) Assessment Improved  Orders: TLB-B12, Serum-Total ONLY (16109-U04) TLB-BMP (Basic Metabolic Panel-BMET) (80048-METABOL) TLB-Hepatic/Liver Function Pnl (80076-HEPATIC) TLB-A1C / Hgb A1C (Glycohemoglobin) (83036-A1C) TLB-TSH (Thyroid Stimulating Hormone) (84443-TSH)  Problem # 2:  HYPERTENSION (ICD-401.9) Assessment: Deteriorated  His updated medication list for this problem includes:    Benicar 20 Mg Tabs (Olmesartan medoxomil) .[name]... Take one tablet daily    Norvasc 5 Mg Tabs (Amlodipine besylate) ..... Once daily  BP today: 142/72 Prior BP: 106/60 (10/07/2009)  Labs Reviewed: K+: 3.9 (10/04/2009) Creat: : 0.9 (10/04/2009)   Chol: 206 (10/04/2009)   HDL: 40.40 (10/04/2009)   LDL: 136 (02/18/2009)   TG: 93.0 (10/04/2009)  Orders: TLB-B12, Serum-Total ONLY (54098-J19) TLB-BMP (Basic Metabolic Panel-BMET) (80048-METABOL) TLB-Hepatic/Liver Function Pnl (80076-HEPATIC) TLB-A1C / Hgb A1C (Glycohemoglobin) (83036-A1C) TLB-TSH (Thyroid Stimulating Hormone) (84443-TSH)  Problem # 3:  ANXIETY (ICD-300.00) Assessment: Unchanged  His updated medication list for this problem includes:    Diazepam 5 Mg Tabs (Diazepam) .[name]... 1/2 or 1 by mouth two times a day - three times a day as needed tremor or insomnia  Problem # 4:  OSTEOARTHRITIS (ICD-715.90) Assessment: Deteriorated  His updated medication list for this problem includes:    Adult Aspirin Low Strength 81 Mg Tbdp (Aspirin) .[name]... Take 1 by mouth qd    Ibuprofen 600 Mg Tabs (Ibuprofen) .[name]... 1 by mouth two times a day as needed pain    Tramadol Hcl 50 Mg Tabs (Tramadol hcl) .[name]... 1 once daily as needed pain  Orders: TLB-B12, Serum-Total ONLY (14782-N56) TLB-BMP  (Basic Metabolic Panel-BMET) (80048-METABOL) TLB-Hepatic/Liver Function Pnl (80076-HEPATIC) TLB-A1C / Hgb A1C (Glycohemoglobin) (83036-A1C) TLB-TSH (Thyroid Stimulating Hormone) (84443-TSH)  Problem # 5:  CORONARY ARTERY DISEASE (ICD-414.00) Assessment: Unchanged  His updated medication list for this problem includes:    Benicar 20 Mg Tabs (Olmesartan medoxomil) .[name]... Take one tablet daily    Norvasc 5 Mg Tabs (Amlodipine besylate) ..... Once daily    Adult Aspirin Low Strength 81 Mg Tbdp (Aspirin) .[name]... Take 1 by mouth qd  Problem # 6:  ALLERGIC RHINITIS (ICD-477.9) Assessment: Unchanged  His updated medication list for this problem includes:    Zyrtec Allergy 10 Mg Tabs (Cetirizine hcl) .[name]... Take as needed  Complete Medication List: 1)  Benicar 20 Mg Tabs (Olmesartan medoxomil) .... Take one tablet daily 2)  Norvasc 5 Mg Tabs (Amlodipine besylate) .... Once daily 3)  Combivent 103-18 Mcg/act Aero (Albuterol-ipratropium) .... Use as directed 4)  Zyrtec Allergy 10 Mg Tabs (Cetirizine hcl) .... Take as needed 5)  Meclizine Hcl 25 Mg Tabs (Meclizine hcl) .... Take 1/2-3 tabs by mouth qd  6)  Ranitidine Hcl 300 Mg Tabs (Ranitidine hcl) .[name].. 1 by mouth once daily for indigestion 7)  Adult Aspirin Low Strength 81 Mg Tbdp (Aspirin) .... Take 1 by mouth qd 8)  Vitamin D3 1000 Unit Tabs (Cholecalciferol) .[name].. 1 by mouth daily 9)  Vitamin B-12 1000 Mcg Tabs (Cyanocobalamin) .... 1.5 tabs  by mouth once daily 10)  Vitamin B-6 500 Mg Tabs (Pyridoxine hcl) .... Once daily 11)  Ibuprofen 600 Mg Tabs (Ibuprofen) .[name].. 1 by mouth two times a day as needed pain 12)  Diazepam 5 Mg Tabs (Diazepam) .... 1/2 or 1 by mouth two times a day - three times a day as needed tremor or insomnia 13)  Tramadol Hcl 50 Mg Tabs (Tramadol hcl) .[name].. 1 once daily as needed pain 14)  Primidone 50 Mg Tabs (Primidone) .... Start with 1/2 tab at bedtime, then take 1 at bedtime if tolerated for tremor  Patient  Instructions: 1)  Please schedule a follow-up appointment in 3 months. Prescriptions: PRIMIDONE 50 MG TABS (PRIMIDONE) Start with 1/2 tab at bedtime, then take 1 at bedtime if tolerated for tremor  #30 x 6   Entered and Authorized by:   Tresa Garter MD   Signed by:   Tresa Garter MD on 01/08/2010   Method used:   Print then Give to Patient   RxID:   [id number] PRIMIDONE 50 MG TABS (PRIMIDONE) Start with 1/2 tab at bedtime, then take 1 at bedtime if Tolerated well. Complicatons - none. Good pain relief following the procedure. for tremor  #30 x 6   Entered and Authorized by:   Tresa Garter MD   Signed by:   Tresa Garter MD on 01/08/2010   Method used:   Print then Give to Patient   RxID:   [id number]

## 2010-10-23 ENCOUNTER — Ambulatory Visit (INDEPENDENT_AMBULATORY_CARE_PROVIDER_SITE_OTHER): Payer: Self-pay | Admitting: Internal Medicine

## 2010-10-23 ENCOUNTER — Encounter: Payer: Self-pay | Admitting: Internal Medicine

## 2010-10-23 DIAGNOSIS — K5909 Other constipation: Secondary | ICD-10-CM

## 2010-10-23 DIAGNOSIS — K5900 Constipation, unspecified: Secondary | ICD-10-CM | POA: Insufficient documentation

## 2010-10-23 DIAGNOSIS — J019 Acute sinusitis, unspecified: Secondary | ICD-10-CM

## 2010-10-23 DIAGNOSIS — M25539 Pain in unspecified wrist: Secondary | ICD-10-CM

## 2010-11-04 ENCOUNTER — Other Ambulatory Visit: Payer: Self-pay

## 2010-11-04 NOTE — Assessment & Plan Note (Signed)
Summary: 3 MO FU/NWS#   Vital Signs:  Patient profile:   65 year old male Height:      69 inches Weight:      275 pounds BMI:     40.76 Temp:     98.8 degrees F oral Pulse rate:   84 / minute Pulse rhythm:   regular Resp:     16 per minute BP sitting:   150 / 70  (left arm) Cuff size:   large  Vitals Entered By: Lanier Prude, CMA(AAMA) (October 23, 2010 8:01 AM) CC: 3 mo f/u c/o sinus pressure & drainage Is Patient Diabetic? No Comments pt is not taking Ranitidine or Amitiza   Primary Care Provider:  Jesusa Stenerson  CC:  3 mo f/u c/o sinus pressure & drainage.  History of Present Illness: The patient presents with complaints of sore throat, fever, cough, sinus congestion and drainge of several days duration. Not better with OTC meds.  Muscle aches are present.  The mucus is colored.  C/o L eye rednessand pain x 2 wks C/o R wrist pain and R hand cramping F/u LBP - better Knees a re swollen C/o constipation  Current Medications (verified): 1)  Benicar 20 Mg  Tabs (Olmesartan Medoxomil) .... Take One Tablet Daily 2)  Norvasc 5 Mg Tabs (Amlodipine Besylate) .... Once Daily 3)  Combivent 103-18 Mcg/act  Aero (Albuterol-Ipratropium) .... Use As Directed 4)  Zyrtec Allergy 10 Mg  Tabs (Cetirizine Hcl) .... Take As Needed 5)  Meclizine Hcl 25 Mg  Tabs (Meclizine Hcl) .... Take 1/2-3 Tabs By Mouth Qd 6)  Ranitidine Hcl 300 Mg Tabs (Ranitidine Hcl) .[name].. 1 By Mouth Once Daily For Indigestion 7)  Adult Aspirin Low Strength 81 Mg  Tbdp (Aspirin) .... Take 1 By Mouth Qd 8)  Vitamin D3 1000 Unit  Tabs (Cholecalciferol) .[name].. 1 By Mouth Daily 9)  Vitamin B-12 1000 Mcg Tabs (Cyanocobalamin) .... 1.5 Tabs  By Mouth Once Daily 10)  Vitamin B-6 500 Mg Tabs (Pyridoxine Hcl) .... Once Daily 11)  Ibuprofen 600 Mg  Tabs (Ibuprofen) .[name].. 1 By Mouth Two Times A Day As Needed Pain 12)  Diazepam 5 Mg Tabs (Diazepam) .... 1/2 or 1 By Mouth Two Times A Day - Three Times A Day As Needed Tremor or  Insomnia 13)  Tramadol Hcl 50 Mg Tabs (Tramadol Hcl) .[name].. 1 Once Daily As Needed Pain 14)  Amitiza 24 Mcg Caps (Lubiprostone) .[name].. 1 By Mouth Once Daily As Needed Constipation 15)  Triamcinolone Acetonide 0.5 % Crea (Triamcinolone Acetonide) .... Use Two Times A Day Prn  Allergies (verified): 1)  ! Pepcid 2)  ! * Nexium 3)  ! Ticlid 4)  ! * Flu Vaccination 5)  ! Axid 6)  ! Lipitor 7)  ! * Vytorin 8)  Allegra-D 24 Hour 9)  Codeine Phosphate (Codeine Phosphate) 10)  Nabumetone (Nabumetone) 11)  Primidone (Primidone) 12)  Hydrocodone 13)  Oxycodone Hcl 14)  Amitiza (Lubiprostone)  Past History:  Past Medical History: Last updated: 06/19/2010 Allergic rhinitis COPD Osteoarthritis Dr Renae Fickle [facility] Keratoses Peripheral vascular disease - carotids CTS B Coronary artery disease Vertigo Tremor Dr Jay Schlichter. OSA/snorring Colonic polyps, hx of  Dr Leone Payor Vit D def Low Vit B12 2009 Hypertension Migraines w/aura Tremor  Social History: Last updated: 01/02/2008 Retired Married Current Smoker Patient has been counseled to quit.  Alcohol Use - no  Review of Systems       The patient complains of fever and prolonged cough.  The patient denies chest pain and dyspnea on exertion.    Physical Exam  General:  NAD, overweight-appearing.   Eyes:  No corneal or conjunctival inflammation noted. EOMI. Perrla. Ears:  B TMs are retracted Nose:  External nasal examination shows no deformity or inflammation.  Mouth:  Erythematous throat and intranasal mucosa c/w URI  Neck:  No masses Lungs:  Normal respiratory effort, chest expands symmetrically. Lungs are clear to auscultation, no crackles or wheezes. Heart:  Normal rate and regular rhythm. S1 and S2 normal without gallop, murmur, click, rub or other extra sounds. Msk:  Cervical spine is  less tender to palpation over paraspinal muscles and with the ROM  Lumbar-sacral spine is less tender to palpation over paraspinal muscles and  less painfull with the ROM  B dorsal wrists are  less swollen dorsally, still stiff and painful w/ROM Hips are less tender B Knees are  tender B R>>L shoulder is tender w/ROM Neurologic:  No cranial nerve deficits noted. Station and gait are normal. Plantar reflexes are down-going bilaterally. DTRs are symmetrical throughout. Sensory, motor and coordinative functions appear intact. Tremor is present - mild Skin:  AKs on L hand, forearm and the scull skin 2 moles on face Psych:  Oriented X3.  normally interactive and good eye contact.     Impression & Recommendations:  Problem # 1:  HAND PAIN (ICD-729.5) Assessment Unchanged F/u w/ortho  Problem # 2:  SINUSITIS, ACUTE (ICD-461.9) Assessment: New  His updated medication list for this problem includes:    Augmentin 875-125 Mg Tabs (Amoxicillin-pot clavulanate) .[name]... 1 by mouth bid  Problem # 3:  HYPERTENSION (ICD-401.9) Assessment: Deteriorated  His updated medication list for this problem includes:    Benicar 20 Mg Tabs (Olmesartan medoxomil) .[name]... Take one tablet daily    Norvasc 5 Mg Tabs (Amlodipine besylate) ..... Once daily Risks of noncompliance with treatment discussed. Compliance encouraged.  BP today: 150/70 Prior BP: 140/60 (08/28/2010)  Labs Reviewed: K+: 4.8 (05/05/2010) Creat: : 1.0 (05/05/2010)   Chol: 206 (10/04/2009)   HDL: 40.40 (10/04/2009)   LDL: 136 (02/18/2009)   TG: 93.0 (10/04/2009)  Problem # 4:  CONSTIPATION, CHRONIC (ICD-564.09) - meds are contributing Assessment: Deteriorated  His updated medication list for this problem includes:    Senokot S 8.6-50 Mg Tabs (Sennosides-docusate sodium) .[name]... 1-2 a day  Complete Medication List: 1)  Benicar 20 Mg Tabs (Olmesartan medoxomil) .... Take one tablet daily 2)  Norvasc 5 Mg Tabs (Amlodipine besylate) .... Once daily 3)  Combivent 103-18 Mcg/act Aero (Albuterol-ipratropium) .... Use as directed 4)  Zyrtec Allergy 10 Mg Tabs (Cetirizine hcl) .... Take as  needed 5)  Meclizine Hcl 25 Mg Tabs (Meclizine hcl) .... Take 1/2-3 tabs by mouth qd 6)  Ranitidine Hcl 300 Mg Tabs (Ranitidine hcl) .[name].. 1 by mouth once daily for indigestion 7)  Adult Aspirin Low Strength 81 Mg Tbdp (Aspirin) .... Take 1 by mouth qd 8)  Vitamin D3 1000 Unit Tabs (Cholecalciferol) .[name].. 1 by mouth daily 9)  Vitamin B-12 1000 Mcg Tabs (Cyanocobalamin) .... 1.5 tabs  by mouth once daily 10)  Vitamin B-6 500 Mg Tabs (Pyridoxine hcl) .... Once daily 11)  Ibuprofen 600 Mg Tabs (Ibuprofen) .[name].. 1 by mouth two times a day as needed pain 12)  Diazepam 5 Mg Tabs (Diazepam) .... 1/2 or 1 by mouth two times a day - three times a day as needed tremor or insomnia 13)  Tramadol Hcl 50 Mg Tabs (Tramadol hcl) .Marland KitchenMarland KitchenMarland Kitchen 1  once daily as needed pain 14)  Amitiza 24 Mcg Caps (Lubiprostone) .[name].. 1 by mouth once daily as needed constipation 15)  Triamcinolone Acetonide 0.5 % Crea (Triamcinolone acetonide) .... Use two times a day prn 16)  Senokot S 8.6-50 Mg Tabs (Sennosides-docusate sodium) .[name].. 1-2 a day 17)  Augmentin 875-125 Mg Tabs (Amoxicillin-pot clavulanate) .[name].. 1 by mouth bid 18)  Align Caps (Probiotic product) .[name].. 1 by mouth once daily for your intesinal flora restoraion 19)  Optivar 0.05 % Soln (Azelastine hcl) .[name].. 1 gtt each eye two times a day for allergies  Patient Instructions: 1)  Please schedule a follow-up appointment in 3 months. 2)  BMP prior to visit, ICD-9: 3)  Hepatic Panel prior to visit, ICD-9: 4)  TSH prior to visit, ICD-9: 5)  CBC w/ Diff prior to visit, ICD-9:596.0 6)  PSA prior to visit, ICD-9: 7)  HbgA1C prior to visit, ICD-9:790.29  Prescriptions: OPTIVAR 0.05 % SOLN (AZELASTINE HCL) 1 gtt each eye two times a day for allergies  #1 x 6   Entered and Authorized by:   Tresa Garter MD   Signed by:   Tresa Garter MD on 10/23/2010   Method used:   Print then Give to Patient   RxID:   [id number] ALIGN  CAPS (PROBIOTIC PRODUCT) 1 by mouth once daily  for your intesinal flora restoraion  #30 x 1   Entered and Authorized by:   Tresa Garter MD   Signed by:   Tresa Garter MD on 10/23/2010   Method used:   Print then Give to Patient   RxID:   [id number] AUGMENTIN 875-125 MG TABS (AMOXICILLIN-POT CLAVULANATE) 1 by mouth bid  #20 x 0   Entered and Authorized by:   Tresa Garter MD   Signed by:   Tresa Garter MD on 10/23/2010   Method used:   Print then Give to Patient   RxID:   [id number] SENOKOT S 8.6-50 MG TABS (SENNOSIDES-DOCUSATE SODIUM) 1-2 a day  #100 x 3   Entered and Authorized by:   Tresa Garter MD   Signed by:   Tresa Garter MD on 10/23/2010   Method used:   Print then Give to Patient   RxID:   [id number]    Orders Added: 1)  Est. Patient Level IV [66063]

## 2010-11-25 ENCOUNTER — Other Ambulatory Visit: Payer: Self-pay | Admitting: Cardiology

## 2010-12-02 ENCOUNTER — Encounter: Payer: Self-pay | Admitting: Cardiology

## 2010-12-03 ENCOUNTER — Encounter: Payer: Self-pay | Admitting: Cardiology

## 2010-12-03 ENCOUNTER — Ambulatory Visit (INDEPENDENT_AMBULATORY_CARE_PROVIDER_SITE_OTHER): Payer: Medicare Other | Admitting: Cardiology

## 2010-12-03 DIAGNOSIS — I10 Essential (primary) hypertension: Secondary | ICD-10-CM

## 2010-12-03 DIAGNOSIS — Z720 Tobacco use: Secondary | ICD-10-CM | POA: Insufficient documentation

## 2010-12-03 DIAGNOSIS — E785 Hyperlipidemia, unspecified: Secondary | ICD-10-CM

## 2010-12-03 DIAGNOSIS — I2510 Atherosclerotic heart disease of native coronary artery without angina pectoris: Secondary | ICD-10-CM

## 2010-12-03 DIAGNOSIS — F17200 Nicotine dependence, unspecified, uncomplicated: Secondary | ICD-10-CM
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
## 2010-12-03 NOTE — Assessment & Plan Note (Signed)
Intolerant to statins. Continue diet. Lipids monitored by primary care.

## 2010-12-03 NOTE — Assessment & Plan Note (Signed)
Patient counseled on discontinuing. 

## 2010-12-03 NOTE — Assessment & Plan Note (Signed)
Continue aspirin and ARB. Intolerant to statins. Schedule Myoview for risk stratification.

## 2010-12-03 NOTE — Assessment & Plan Note (Signed)
Blood pressure controlled. Continue present medications. Renal function monitored by primary care.

## 2010-12-03 NOTE — Patient Instructions (Signed)
Your physician has requested that you have a lexiscan myoview. For further information please visit [email or web address] Please follow instruction sheet, as given.   Your physician recommends that you schedule a follow-up appointment in: one year

## 2010-12-22 ENCOUNTER — Ambulatory Visit: Payer: Medicare Other | Admitting: Cardiology

## 2011-01-06 NOTE — Letter (Signed)
October 11, 2008     RE:  JOSEALBERTO, MONTALTO  MRN:  [id number]  /  DOB:  1946/07/09   To Whom It May Concern:   Mr. Laufer is a patient of mine at [facility], [place],  [place].  He has a history of coronary artery disease status post  coronary bypass graft as well as hypertension.  He is doing well with  Cardizem for his antianginal.  He cannot take beta-blockers due to  severe COPD.  He requires a long-acting Cardizem CD instead of generic.  Please feel free to contact me for any concerns or questions about this  issue.    Sincerely,      Madolyn Frieze. Jens Som, MD, [facility]  Electronically Signed    BSC/MedQ  DD: 10/11/2008  DT: 10/12/2008  Job #: [id number]

## 2011-01-06 NOTE — Assessment & Plan Note (Signed)
[facility]                            CARDIOLOGY OFFICE NOTE   ARLEY, SALAMONE                     MRN:          [id number]  DATE:04/27/2007                            DOB:          [birth year]    Joe [name] is a 65 year old gentleman who is status post coronary  artery bypassing graft in 2003.  He had a LIMA to the LAD, saphenous  vein graft to the first obtuse marginal, and a sequential saphenous vein  graft to the PDA and posterolateral.  Since I last saw him, he is  complaining of significant sinus drainage.  He also is complaining of  occasional vertigo.  He also has had some pain in his chest with  coughing.  He has mild dyspnea on exertion.  There is no orthopnea or  PND.  He occasionally has mild pedal edema.   MEDICATIONS AT PRESENT:  1. Bayer aspirin 81 mg p.o. daily.  2. AcipHex.  3. Zyrtec.  4. Benicar 20 mg p.o. daily.  5. Cartia XT 180 mg p.o. daily.  6. Fish oil.  7. Combivent.   PHYSICAL EXAMINATION:  VITAL SIGNS:  Blood pressure 134/80, , pulse 70.  He weighs 264 pounds.  HEENT:  Normal.  NECK:  Supple, with no bruits.  CHEST:  Clear.  CARDIOVASCULAR:  Reveals a regular rate and rhythm.  ABDOMEN:  Shows some tenderness to deep palpation.  There is no bruit  noted.  EXTREMITIES:  Show trace edema.   Electrocardiogram shows a sinus rhythm at a rate of 65.  There is left  axis deviation, as well as an incomplete right bundle branch block.  There are no ST changes noted.   DIAGNOSES:  1. Chest pain.  His symptoms seem most consistent with musculoskeletal      pain related to his cough.  However, it has been 5 years since his      surgery, and we will plan to risk stratify with an adenosine      Myoview.  2. Coronary artery disease, status post coronary artery bypassing      graft.  We will continue with his aspirin, Cardizem, and Benicar.      Note, he has not tolerated statins in the past.  3. Hyperlipidemia.  He  will continue on his fish oil.  He has not      tolerated statins or Zetia in the past and is unwilling to try      these.  4. History of chronic obstructive pulmonary disease.  5. Hypertension.  His blood pressure is well controlled.  6. Gastroesophageal reflux disease.  7. Bronchitis.  He has requested a Z-PAK, and I have provided that      today.  8. Pain on deep palpation in his abdomen.  We will check an abdominal      ultrasound to exclude aneurysm.  9. Tobacco abuse.  We discussed the importance of discontinuing this,      as he has resumed.  10.I will see him back in 6 months.     Madolyn Frieze Jens Som, MD, [facility]  Electronically Signed  BSC/MedQ  DD: 04/27/2007  DT: 04/27/2007  Job #: [id number]

## 2011-01-06 NOTE — Assessment & Plan Note (Signed)
[facility]                            CARDIOLOGY OFFICE NOTE   BYRANT, VALENT                     MRN:          [id number]  DATE:10/28/2007                            DOB:          [birth year]    [name] is a very pleasant gentleman who has a history of coronary  disease status post bypassing graft.  When I last saw him, we scheduled  him to have a Myoview.  This was performed on May 04, 2007.  There  is no sign of scar or ischemia and ejection fraction was 78%.  He is  under significant amount of stress right now as his mother has terminal  cancer.  He does have some dyspnea on exertion.  There is no orthopnea  or PND.  There is no pedal edema.  Has not had exertional chest pain.  He does have some pain in his lower extremities.  This is in the thighs  with ambulation, but also in his knees.  His present medication include  fish oil, Cartia 180 mg daily, Benicar 20 mg p.o. daily, aspirin 81 mg  daily.   PHYSICAL EXAM:  Today shows a blood pressure 127/80 and his heart rate  71.  HEENT:  Normal.  NECK:  Supple.  CHEST:  Clear.  CARDIOVASCULAR:  Regular rate.  ABDOMEN:  Exam shows no tenderness.  EXTREMITIES:  Show no edema.  Note he has 2+ posterior tibial pulses  bilaterally.   His electrocardiogram shows a sinus rhythm at a rate of 71.  There is  left axis deviation.  There is poor R wave progression.   DIAGNOSES:  1. Coronary disease status post coronary artery bypass graft - Mr.      Heintzelman is not having significant exertional chest pain.  His      recent Myoview was negative.  We will continue with medical therapy      including his aspirin, Benicar, and Cartia.  He has not tolerated      statins in the past.  2. Hyperlipidemia - he will continue on his fish oil.  Has not      tolerated statins as a in the past.  3. Chronic obstructive pulmonary disease.  4. Hypertension - his blood pressure is adequately controlled.  5. Gastroesophageal reflux disease.  6. Tobacco abuse - we discussed importance of discontinuing this.  7. Possible lower extremity claudication - we will check ABIs with      Dopplers.   I will see him back in 9 months.  Of note, previous abdominal ultrasound  showed no aneurysm.     Madolyn Frieze Jens Som, MD, [facility]  Electronically Signed    BSC/MedQ  DD: 10/28/2007  DT: 10/28/2007  Job #: [id number]

## 2011-01-06 NOTE — Assessment & Plan Note (Signed)
[facility]                         GASTROENTEROLOGY OFFICE NOTE   DECARLO, RIVET                     MRN:          [id number]  DATE:01/05/2007                            DOB:          [birth year]    Alazar comes in today and says he has been doing all right, he just  wanted to see me before I left and get a referral.  He says his bowel  movements have been a little sluggish at times so we talked about some  prunes and Senokot at bedtime and the prunes in the morning.  He says he  has got a little bloating at times.  He is having problems with his  cholesterol.  We talked about his hyperlipidemia and statins and so on.  He has seen Shelby Dubin and all the cardiologists.  Unfortunately, this  has been a problem for him and he is going to follow up with Dr.  Posey Rea for this.  I told him dietary discretion, so on, and he says  he tries but sometimes he just cannot control himself, and I can  understand that.  In any case, he does have some IBS symptoms and he  takes AcipHex daily.   PHYSICAL EXAMINATION:  He weighed 204.  Blood pressure 132/72, pulse 84  and regular.  His oropharynx was negative, neck was negative, chest was  clear, heart revealed a regular rhythm.  Abdomen was obese, soft, no  mass or organomegaly, nontender.  Extremities unremarkable.   IMPRESSION:  1. Gastroesophageal reflux disease, controlled with proton pump      inhibitor.  2. Irritable bowel syndrome in a patient who has known previous polyps      of the colon and some mild diverticular disease.   RECOMMENDATIONS:  Continue on same medication, take the medications as  above, and he is to follow up with one of my colleagues in the near  future.     Ulyess Mort, MD  Electronically Signed    SML/MedQ  DD: 01/05/2007  DT: 01/05/2007  Job #: [id number]

## 2011-01-09 NOTE — Op Note (Signed)
NAME:  Joe [name], Joe [name] NO.:  [id number]   MEDICAL RECORD NO.:  [id number]                   PATIENT TYPE:  INP   LOCATION:  2307                                 FACILITY:  [facility]   PHYSICIAN:  Gwenith Daily. Tyrone Sage, M.D.            DATE OF BIRTH:  [birth year]   DATE OF PROCEDURE:  06/27/2002  DATE OF DISCHARGE:                                 OPERATIVE REPORT   PREOPERATIVE DIAGNOSES:  New onset of angina and three-vessel coronary  artery disease.   POSTOPERATIVE DIAGNOSES:  New onset of angina and three-vessel coronary  artery disease.   OPERATION PERFORMED:  Coronary artery bypass grafting times four with the  left internal mammary artery to the left anterior descending coronary  artery, sequential reversed saphenous vein graft to the posterior descending  and posterolateral branches of the right coronary artery, reversed saphenous  vein to the first obtuse marginal coronary artery with Endo vein harvesting  from the left thigh.   SURGEON:  Gwenith Daily. Tyrone Sage, M.D.   ASSISTANT:  Coral Ceo, P.A.   ANESTHESIA:   INDICATIONS FOR PROCEDURE:  The patient is a 65 year old male who presented  with new onset of anginal symptoms.  He underwent cardiac catheterization  which demonstrated evidence of ostial left main disease of 80% and disease  diffusely throughout the right coronary artery involving the takeoff of the  posterior descending coronary artery.  Overall ventricular function was  preserved.  Coronary artery bypass grafting was recommended to the patient,  who agreed and signed informed consent.   DESCRIPTION OF PROCEDURE:  With Swann-Ganz and arterial line monitors in  place, the patient underwent general endotracheal anesthesia without  incident.  The skin of the chest and legs was prepped with Betadine and  draped in the usual sterile manner.  Initially, attempt to harvest endo vein  from the right thigh was unsuccessful.  A small  incision was made in the  vicinity of the knee but vein was never located.  For this reason, we  performed incision at the left knee and used the Guidant Endo vein harvest  system to satisfactorily harvest left thigh vein.  A median sternotomy was  performed.  The left internal mammary artery was dissected down as a pedicle  graft.  The distal artery was divided and had good free flow.  The  pericardium was opened.  Overall ventricular function appeared preserved.  The patient was systemically heparinized.  The ascending aorta and the right  atrium were cannulated in the aortic root.  A bent cardioplegia needle was  introduced into the ascending aorta.  The patient was placed on  cardiopulmonary bypass at 2.4L per minute per meter squared.  Sites for  anastomosis were selected and dissected out of the epicardium.  Attention  was turned first to the circumflex.  This was divided into a larger first  obtuse marginal branch.  This vessel was opened.  The vessel itself was  relatively small, admitting a 1 mm probe distally, but not a 1.5.  Using a  segment of reversed saphenous vein graft, a distal anastomosis was  performed.  Attention was then turned to the posterior descending coronary  artery which was opened and admitted a 1.5 mm probe but was diffusely  diseased.  Using a short-Y, an end-to-side anastomosis was carried out.  The  distal extent of the same vein was carried a short distance through a  diffusely diseased posterolateral branch.  This vessel was opened and was a  thin-walled poor quality vessel but adequate for bypass.  Attention was then  turned to the left anterior descending coronary artery.  The LAD was a small  vessel and the proximal two thirds of the vessel were deeply  intramyocardial.  The vessel was located distally and opened.  A 1.5 mm  probe passed proximally and a 1 mm probe passed distally.  Using a running 8-  0 Prolene, the left internal mammary artery was  anastomosed to the left  anterior descending coronary artery.   With release of the [employer], there were several areas of bleeding.  Attempted repair of this was not successful, so we decided to redo the  anastomosis.  The bulldog was placed back on the mammary artery and the  anastomosis was taken down.  The end of the mammary was retrimmed and the  left anterior descending coronary artery anastomosis to the mammary artery  was then carried.  With release of the Edwards bulldog on the mammary  artery, there was appropriate rise in myocardial septal temperature.  Overall, the left anterior descending coronary artery was of poor quality.  The aortic crossclamp was removed.  Total crossclamp time was 86 minutes.  The patient required electrical defibrillation and initially returned in  complete heart block but developed sinus rhythm.  A partial occlusion clamp  was placed on the ascending aorta.  Two punch aortotomies were performed and  each of the two vein grafts were anastomosed to the ascending aorta.  Air  was evacuated from the grafts and the partial occlusion clamp was removed.  The sites of anastomosis were inspected and were free of bleeding.  The  patient was then ventilated and weaned from cardiopulmonary bypass without  difficulty.  He remained hemodynamically stable, was decannulated in the  usual fashion.  Protamine sulfate was administered.  With the operative  field hemostatic, two atrial and two ventricular pacing wires were applied.  Graft markers were applied.  A left pleural tube and two mediastinal tubes  were left in place.  Sternum was closed with #6 stainless steel wire.  Fascia closed with interrupted 0 Vicryl, running 3-0 Vicryl in the  subcutaneous tissues and 4-0 subcuticular stitch in the skin edges.  Dry  dressings were applied.  Sponge and needle counts were reported as correct  at the completion of the procedure.  The patient tolerated the  procedure without obvious complication and was transferred to the surgical intensive  care unit for further postoperative care.   Because of the patient's moderate obesity and especially because of the  diffuse nature of his distal disease, redo coronary artery bypass grafting  will be difficult.  Gwenith Daily Tyrone Sage, M.D.    EBG/MEDQ  D:  06/27/2002  T:  06/28/2002  Job:  [id number]   cc:   [facility]

## 2011-01-09 NOTE — Op Note (Signed)
NAME:  Joe [name], Joe [name] NO.:  [id number]   MEDICAL RECORD NO.:  [id number]                   PATIENT TYPE:  OIB   LOCATION:  3014                                 FACILITY:  [facility]   PHYSICIAN:  Reinaldo Meeker, M.D.              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   PROCEDURE:  C5-6 anterior cervical diskectomy with bone bank fusion,  followed by Zephyr anterior cervical plating.   SURGEON:  Reinaldo Meeker, M.D.   ASSISTANT:  Donalee Citrin, M.D.   PROCEDURE IN DETAIL:  After being placed in the supine position in 10 pounds  Holter traction, the patient's neck was prepped and draped in the usual  sterile fashion.  A localizing x-ray was taken prior to incision to identify  the appropriate level.  A transverse incision was made in the right anterior  neck starting at the midline and headed toward the medial aspect of the  sternocleidomastoid muscle.  The deep platysma muscle was then incised  transversely.  The natural fascial plane between the strap muscles medially  and the sternocleidomastoid laterally was identified and followed down to  the anterior aspect of the cervical spine.  The longus colli muscles were  identified and split in the midline and stripped away bilaterally with the  Optician, dispensing.  A second x-ray showed approach to the  appropriate level.  Using a 15 blade the annulus of the disk at C5-6 was  incised.  Using pituitary rongeurs and curettes, approximately 90% of disk  material was removed.  A high-speed drill was used to widen the interspace.  The microscope was draped and brought into the field and used for the  remainder of the case.  Using microdissection technique, the remainder of  disk material down to the posterior longitudinal ligament was removed.  The  ligament was then incised transversely.  A large fragment of disk was  identified pressing on the spinal cord just paracentral to the left.  This  was removed and the underlying dura was then found to be well-decompressed.  Proximal foraminal decompression was carried out bilaterally.  At this point  inspection was carried out in all directions for any evidence of residual  compression, and none could be identified.  Large amounts of irrigation were  carried out.  Bleeding was controlled with bipolar coagulation and Gelfoam.  Measurements were taken and an 8 mm plug was reconstituted and impacted  without difficulty.  Fluoroscopy showed it to be in good position.  Large  amounts of irrigation were then carried out at this time and any bleeding  controlled with bipolar coagulation.  The wound was then closed using  interrupted  Vicryl on the platysma muscle, inverted 5-0 PDS on the  subcuticular layer, and Steri-Strips on the skin.  A sterile dressing and  soft collar were applied and the patient was extubated, taken to the  recovery room in stable condition.                                               Reinaldo Meeker, M.D.    ROK/MEDQ  D:  02/15/2004  T:  02/17/2004  Job:  [id number]

## 2011-01-09 NOTE — Consult Note (Signed)
NAME:  Joe [name], Joe [name] NO.:  [id number]   MEDICAL RECORD NO.:  [id number]                   PATIENT TYPE:  EMS   LOCATION:  MAJO                                 FACILITY:  [facility]   PHYSICIAN:  Learta Codding, M.D. [facility]             DATE OF BIRTH:  [birth year]   DATE OF CONSULTATION:  10/19/2002  DATE OF DISCHARGE:                                   CONSULTATION   REFERRING PHYSICIAN:  Dr. Georgina Quint. Plotnikov.   CARDIOLOGIST:  Dr. Olga Millers.   CURRENT COMPLAINTS:  Referred to the ER after phone call to the office when  the patient complained of some nausea and tingling in the left arm.   HISTORY OF PRESENT ILLNESS:  The patient is a 65 year old white male with a  history of coronary artery disease, status post coronary artery bypass graft  on June 27, 2002.  The patient stated that for the last several weeks,  since September 28, 2002, he had had problems with vertigo and significant  sinus drainage and congestion.  He has been treated with antibiotics.  He  did feel significantly fatigued.  He also went back to work on February 1st,  but felt that his work was still too much for him.  At times he felt dizzy  and lightheaded, particularly with periods of nausea that appeared to be  related to significant sinus drainage.  He also reports symptoms of reflux  with heartburn.  Earlier today, he felt anxious and felt that his heart was  racing.  He was taking decongestants up until yesterday.  He called into the  office earlier today and reported some coughing associated with chest  tightness.  Recommendations were given for the patient to come to the  emergency room.  In the ER, the patient is quite comfortable.  He reports no  recurrent substernal chest pain.  His EKG shows no electrocardiographic  changes suggestive of ischemia.  He does relate to me significant symptoms  of reflux as well as nausea and vertigo.  He states that he has been taking  Prilosec over the counter but it did not appear to be working.   ALLERGIES:  AXID and PEPCID, STATINS.   MEDICATIONS:  1. Zetia 10 mg a day.  2. Aggrenox 200/25 mg one tablet a day.  3. Cartia XT 180 mg a day.  4. Enteric-coated aspirin 81 mg.  5. Altace 5 mg a day.  6. Prilosec over the counter.   PAST MEDICAL HISTORY:  1. PCI at [facility] in 1991.  2. Status post CABG on June 27, 2002 by Dr. Ramon Dredge B. Gerhardt with LIMA     to the LAD, saphenous vein graft to OM, question of saphenous vein graft     to PDA and PL.  3. COPD.  4. Hyperlipidemia.  5. Irritable bowel syndrome.  6. GERD.  7. Hypertension.  8.  Atherosclerotic peripheral vascular disease.   SOCIAL HISTORY:  The patient lives in [place], is divorced, has three  children, quit tobacco June 20, 2002, does not drink alcohol and works as  a Curator.   FAMILY HISTORY:  Mother is alive in her 61s.  Father died at age 63 of  coronary artery disease.  Mother has hypertension.   REVIEW OF SYSTEMS:  Occasional fever and chills and sweats, positive for  headache and symptoms of blurred vision and ringing in the ears, mild  shortness of breath, cough and wheezing.  No frequency or dysuria.  Positive  for nausea and diarrhea.  Positive for GERD.   PHYSICAL EXAMINATION:  VITAL SIGNS:  Blood pressure is 133/78, heart rate is  98.  GENERAL:  A 65 year old white male in no apparent distress.  HEENT/NECK:  No bruits or JVD.  HEART:  Heart is regular rate and rhythm, normal S1 and S2.  LUNGS:  Clear breath sounds.  SKIN:  Skin warm and dry.  ABDOMEN:  Abdomen soft and nontender.  GU:  Deferred.  RECTAL:  Deferred.  EXTREMITIES:  Pulses intact and no edema.   LABORATORY AND ACCESSORY CLINICAL DATA:  Chest x-ray:  Status post CABG but  no acute abnormalities.   EKG:  Normal sinus rhythm, rate 75 beats per minute, old septal MI and T  wave inversions which are not new.   CBC:  Hemoglobin 16.1,  white count is 8.3, platelet count 280.  BUN is 10,  creatinine is 0.9. Troponin is less than 0.01, CK-MB is 1.7.   IMPRESSION AND PLAN:  1. Nausea.  The patient appears to have symptoms of reflux disease.     Prilosec over the counter has not been helping him.  I have given him a     prescription for Prevacid, however, this does not appear to be an acute     coronary syndrome.  He has no electrocardiographic changes.  Cardiac     enzymes are negative.  2. Sinus infection.  The patient has significant sinus drainage and we have     given him a prescription for Augmentin.  3. Status post coronary artery bypass grafting.  Again, as outlined in #1,     no evidence of ischemia.  Chest pain is very atypical and appears to be     related to cough.  4. Chronic obstructive pulmonary disease, stable.    DISPOSITION:  The patient has been referred to our office for a followup  with a Cardiolite study.  He was also given recommendation if he has return  of substernal chest pain, that he can call Dr. Learta Codding or present to  the emergency room.  It does appear that his symptoms are mainly related to  sinus congestion and reflux.  He has been given the appropriate medications  outlined above.                                               Learta Codding, M.D. [facility]    GED/MEDQ  D:  10/19/2002  T:  10/20/2002  Job:  [id number]   cc:   Georgina Quint. Plotnikov, M.D. [facility]   Olga Millers, M.D. [facility]

## 2011-01-09 NOTE — H&P (Signed)
NAME:  Joe [name], Joe [name] NO.:  [id number]   MEDICAL RECORD NO.:  [id number]                   PATIENT TYPE:   LOCATION:                                       FACILITY:   PHYSICIAN:  Georgina Quint. Plotnikov, M.D. [facility]      DATE OF BIRTH:  21-Feb-1946   DATE OF ADMISSION:  06/20/2002  DATE OF DISCHARGE:                                HISTORY & PHYSICAL   CHIEF COMPLAINT:  Chest pain, sweats.   HISTORY OF PRESENT ILLNESS:  The patient is a 65 year old male who has been  having episodes of exertional chest pain with shortness of breath  accompanied by tingling and numbness in the left hand and left side of his  face and discomfort in the left axilla area. The episodes last for several  minutes, maybe accompanied by diaphoresis. He had the last episode earlier  this morning.   PAST MEDICAL HISTORY:  1. Coronary artery disease status post angioplasty.  2. History of peripheral vascular disease.  3. Hypertension.  4. COPD.  5. Hyperlipidemia.  6. Colon polyps.  7. GERD with esophagitis.   FAMILY HISTORY:  Positive for coronary disease.   SOCIAL HISTORY:  He continues to smoke 10 cigarettes a day. He is divorced.  No alcohol.   ALLERGIES:  Pepcid and Axid.   CURRENT MEDICATIONS:  1. Benicar 20 mg a day.  2. Meclizine p.r.n.  3. Cartia XT 80 mg daily.  4. Aspirin 81 mg daily.  5. Benicar 20 mg daily.  6. Aggrenox one twice a day.  7. Zetia 10 mg a day.  8. Baby aspirin one a day.   REVIEW OF SYMPTOMS:  Prior to this episode, no previous exertional symptoms.  No syncope. Chronic cough. Chronic GI problems. The rest is negative.   PHYSICAL EXAMINATION:  VITAL SIGNS:  Blood pressure 130/84, pulse 68,  temperature 98.7.  GENERAL:  The patient is no acute distress.  HEENT:  With moist mucosa.  NECK:  Supple, no bruit, no thyromegaly.  LUNGS:  Clear, no wheezes or rales. No dullness to percussion.  HEART:  S1 and S2, no gallop, no murmur.  ABDOMEN:   Obese, soft, nontender, no organomegaly, no masses felt.  EXTREMITIES:  Without edema, peripheral pulses symmetric.  NEUROLOGIC EXAM:  He is alert, oriented, and cooperative. Denies being  depressed.  SKIN:  Clear.   LABORATORY DATA:  EKG with normal sinus rhythm. On 05/16/02, he had EGD which  showed esophagitis and colonoscopy with colon polyps.   ASSESSMENT/PLAN:  1. Chest pain, likely unstable angina. Admit for subcu Lovenox, telemetry,     continue current therapy, cardiology consult to continue intervention.  2. Hypertension, seems to be in control. Continue with current therapy.  3. Hyperlipidemia with a history of intolerance to statins. Will continue     ____________, check lipids and homocystine.  4. Chronic obstructive pulmonary disease, continue current management. He     needs to stop smoking.  5. Esophagitis status post recent EGD by Dr. Victorino Dike.                                               Georgina Quint. Plotnikov, M.D. [facility]    AVP/MEDQ  D:  06/20/2002  T:  06/20/2002  Job:  [id number]   cc:   Ulyess Mort, M.D. [facility]

## 2011-01-09 NOTE — Discharge Summary (Signed)
NAME:  Joe [name], Joe [name] NO.:  [id number]   MEDICAL RECORD NO.:  [id number]                   PATIENT TYPE:  INP   LOCATION:  2032                                 FACILITY:  [facility]   PHYSICIAN:  Gwenith Daily. Tyrone Sage, M.D.            DATE OF BIRTH:  [birth year]   DATE OF ADMISSION:  06/20/2002  DATE OF DISCHARGE:  07/02/2002                                 DISCHARGE SUMMARY   PRIMARY ADMITTING DIAGNOSES:  1. Unstable angina.  2. Coronary artery disease with left main coronary disease.   ADDITIONAL/DISCHARGE DIAGNOSES:  1. Unstable angina.  2. Coronary artery disease with left main coronary disease.  3. Hypertension.  4. Peripheral vascular disease.  5. History of tobacco use.  6. Gastroesophageal reflux disease.   PROCEDURES PERFORMED:  1. Cardiac catheterization.  2. Coronary artery bypass grafting x4 (left internal mammary artery to the     left anterior descending, saphenous vein graft to the first obtuse     marginal, sequential saphenous vein graft to posterior descending and     posterolateral arteries).  3. Endoscopic vein harvest, left thigh.   HISTORY:  The patient is a 65 year old white male with a history of coronary  artery disease.  Approximately three days prior to this admission, he began  to develop intermittent substernal chest pain on exertion associated with  shortness of breath, nausea and diaphoresis.  He has been taking over-the-  counter Tums and aspirin at home without complete relief.  He was admitted  for further evaluation.   HOSPITAL COURSE:  He underwent cardiac catheterization which showed  significant coronary artery disease including left main stenosis on the  order of 75%.  He was not felt to be a good candidate for percutaneous  intervention.  Cardiothoracic surgery consultation was obtained and it was  felt that he would benefit from surgical revascularization.  He was  maintained on a heparin drip and remained  pain-free until the time of  surgery.  He was taken to the operating room on June 27, 2002 and  underwent CABG x4 with the above-noted grafts.  He tolerated the procedure  well and was transferred to the SICU in stable condition.  He was extubated  shortly after surgery.  He was hemodynamically stable and doing well, postop  day 1.  At that time, his dressings were removed and he was transferred to  the floor.  Postoperatively, he has done well with the exception of some  pulmonary difficulties.  He initially developed a low-grade fever as well as  productive cough with yellow sputum.  He was started empirically on Tequin.  His white blood count has gradually trended back downward and at present is  9.9.  He has remained afebrile and vital signs have been stable.  He has  been weaned off supplemental oxygen and is maintaining 02 saturations of  greater than 90% on room air.  His cough  has improved.  Otherwise, he has  done well.  He has been ambulating in the halls without difficulty.  He has  been diuresed back down to within 3 pounds of his preoperative weight.  His  hemoglobin and hematocrit have dropped slightly and at present are 8.6 and  24.3, respectively.  He has no active signs of bleeding and has been started  on iron replacement therapy.  His incisions are healing well.  He has  remained in normal sinus rhythm.  It is felt that if he continues to remain  stable, he will be ready for discharge home on July 02, 2002.   DISCHARGE MEDICATIONS:  1. Enteric-coated aspirin 325 mg every day.  2. Lopressor 25 mg b.i.d.  3. Tequin 400 mg every day x7 days.  4. Combivent metered-dose inhaler two puffs q.i.d.  5. Folic acid 1 mg every day.  6. Niferex 150 mg every day.  7. Colace 100 mg b.i.d.  8. Zetia 10 mg every day.  9. Altace 2.5 mg every day.  10.      Prilosec 20 mg b.i.d.  11.      Tylox one to two q.4h. p.r.n. for pain.   DISCHARGE INSTRUCTIONS:  He is to refrain from  driving, heavy lifting or  strenuous activity.   DIET:  He may continue a low-fat, low-sodium diet.   WOUND CARE:  He has been asked to shower daily and clean his incisions with  soap and water.   FOLLOWUP:  He will follow up with Dr. Jonelle Sidle in the office in  two weeks and have a chest x-ray at that time.  He will then see Dr. Ramon Dredge  B. Gerhardt in three weeks and our office will call and schedule this  appointment.  He is asked to bring his chest x-ray to the [facility] office.       Coral Ceo, P.A.                        Gwenith Daily Tyrone Sage, M.D.    GC/MEDQ  D:  07/01/2002  T:  07/03/2002  Job:  [id number]   cc:   Georgina Quint. Plotnikov, M.D. [facility]   Jonelle Sidle, M.D. [facility]

## 2011-01-09 NOTE — Cardiovascular Report (Signed)
NAME:  Joe [name], Joe [name] NO.:  [id number]   MEDICAL RECORD NO.:  [id number]                   PATIENT TYPE:  INP   LOCATION:  3709                                 FACILITY:  [facility]   PHYSICIAN:  Jonelle Sidle, M.D. [facility]        DATE OF BIRTH:  [birth year]   DATE OF PROCEDURE:  DATE OF DISCHARGE:                              CARDIAC CATHETERIZATION   [place] CARDIOLOGIST:  Learta Codding, M.D.   INDICATIONS FOR PROCEDURE:  The patient is a 65 year old male with a history  of hypertension, dyslipidemia, tobacco use, and known coronary artery  disease status post remote two-vessel angioplasty at [facility]  approximately 10 years ago.  The patient apparently underwent intervention  to the right coronary artery and potentially, the circumflex, although  records are sparse in this regard.  He had cardiac catheterization at this  facility in 1993 which revealed no significant restenosis with only minor  atherosclerotic burden.  He presents now with a two-day history of  intermittent chest discomfort suggestive of potential unstable angina and  has ruled out for myocardial infarction.  Coronary angiography is requested  to outline the coronary anatomy and assess for revascularization options.   PROCEDURE PERFORMED:  1. Left heart catheterization.  2. Selective coronary angiography.  3. Left ventriculography.   ACCESS AND EQUIPMENT:  The area about the right femoral artery was  anesthetized with 1% lidocaine and a #6 French sheath was placed in the  right femoral artery via the modified Seldinger technique.  Standard  preformed #6 [place] JL4, JL5, JL5 guide, and JR4 catheters were used for  selective coronary angiography.  A #6 French angled pigtail catheter was  used for left heart catheterization and left ventriculography.  All  exchanges were made over a wire.  There were no immediate complications.   HEMODYNAMICS:  1. Left ventricle:  120/18  mmHg.  2. Aorta:  120/71 mmHg.   ANGIOGRAPHIC FINDINGS:  1. The left main coronary artery has an ostial stenosis of approximately     75%.  Engagement of the left main ostium resulted in ventricularization     consistently despite use of a JL4 and JL5 catheter for more optimal     placement.  Intracoronary nitroglycerin was also given (150 mcg) with     persistence of this stenosis.  A cusp shot was also obtained using a JL5     guide with persistence of the stenosis as well following an additional     dose of sublingual nitroglycerin suggesting spasm is not a component.  2. The left anterior descending is a medium caliber vessel with two diagonal     branches.  There are 20% stenoses in this system without flow-limiting     obstruction.  3. The circumflex coronary artery is a medium caliber vessel as well with a     reasonable sized obtuse marginal branch.  There is 20% proximal narrowing  within the circumflex as well as a 40-50% bifurcation stenosis at the mid     vessel level and a 60-70% stenosis in the obtuse marginal branch.  4. The right coronary artery is dominant and there is 30% proximal narrowing     with a 50% ostial PDA stenosis and a 60-70% mid PDA stenosis.  5. A ventriculogram was performed in the RAO projection revealing an     ejection fraction of 60-65% with no focal wall motion abnormalities and     trace mitral regurgitation noted in the setting of ventricular ectopy.   DIAGNOSES:  1. Left main disease with an approximately 75% ostial stenosis that persists     following intracoronary and sublingual nitroglycerin and is noted as well     on cusp shot.  There is also moderate two-vessel coronary artery disease     involving the circumflex and RCA systems.  2. Normal left ventricular ejection fraction of 65%.   RECOMMENDATIONS:  I have initially reviewed the films with Dr. Riley Kill and  will plan to review these as well with Dr. Andee Lineman.  We will most likely ask   for CVTS input for coronary artery bypass grafting.  We will plan on  restarting heparin six hours after sheath has been removed.                                               Jonelle Sidle, M.D. [facility]    SGM/MEDQ  D:  06/22/2002  T:  06/22/2002  Job:  [id number]

## 2011-01-09 NOTE — Assessment & Plan Note (Signed)
[facility]                            CARDIOLOGY OFFICE NOTE   BRAZOS, SANDOVAL                     MRN:          [id number]  DATE:08/11/2006                            DOB:          Jun 23, 1946    Joe [name] is a pleasant gentleman, who has a history of coronary  disease, status post coronary artery bypass and graft.  Since I last saw  him, there is no dyspnea or chest pain.  He does occasionally have some  tingling in his hands and eyes and also complains of arthritis.  He has  not had other neurological symptoms.   MEDICATIONS INCLUDE:  1. Aspirin 81 mg daily.  2. AcipHex 40 mg p.o. daily.  3. Zyrtec 10 mg p.o. daily.  4. Benicar 20 mg p.o. daily.  5. Cartia XT 180 mg p.o. daily.  6. Potassium 20 mEq p.o. daily.  7. Asmanex.  8. Lasix 40 mg p.o. b.i.d.  9. Fish oil.  10.Zantac.   PHYSICAL EXAMINATION:  Physical exam today shows a blood pressure of  126/78 and his pulse is 70.  He weighs 267 pounds.  He is well-developed  and obese.  NECK:  His neck is supple with no bruits.  CHEST:  His chest is clear.  CARDIOVASCULAR EXAM:  Reveals a regular rate and rhythm.  EXTREMITIES:  Show trace edema.   His electrocardiogram shows a sinus rhythm at a rate of 70.  There is  left axis deviation and an RV conduction delay, but there are no ST  changes.   DIAGNOSES:  1. Coronary artery disease, status post coronary bypass and graft.  2. Preserved LV function.  3. Hyperlipidemia.  4. History of intolerance to statins and Zetia.  5. History of COPD.  6. Hypertension.  7. Gastroesophageal reflux disease.   PLAN:  Joe [name] is doing well from symptomatic standpoint.  We  discussed risk factor modification today, including diet and exercise.  He is not willing to try any other medications for  his cholesterol, as he has not tolerated statins or Zetia.  He did  discontinue his tobacco use.  I discussed the importance of exercise.  He will  see me back in nine months.     Joe [name] Joe Som, MD, [facility]  Electronically Signed    BSC/MedQ  DD: 08/11/2006  DT: 08/11/2006  Job #: [id number]

## 2011-01-16 ENCOUNTER — Other Ambulatory Visit: Payer: Self-pay | Admitting: Internal Medicine

## 2011-01-16 ENCOUNTER — Other Ambulatory Visit: Payer: Medicare Other

## 2011-01-16 DIAGNOSIS — T887XXA Unspecified adverse effect of drug or medicament, initial encounter: Secondary | ICD-10-CM

## 2011-01-16 DIAGNOSIS — E119 Type 2 diabetes mellitus without complications: Secondary | ICD-10-CM

## 2011-01-23 ENCOUNTER — Other Ambulatory Visit (INDEPENDENT_AMBULATORY_CARE_PROVIDER_SITE_OTHER): Payer: Medicare Other

## 2011-01-23 ENCOUNTER — Ambulatory Visit (INDEPENDENT_AMBULATORY_CARE_PROVIDER_SITE_OTHER): Payer: Medicare Other | Admitting: Internal Medicine

## 2011-01-23 ENCOUNTER — Encounter: Payer: Self-pay | Admitting: Internal Medicine

## 2011-01-23 DIAGNOSIS — E559 Vitamin D deficiency, unspecified: Secondary | ICD-10-CM

## 2011-01-23 DIAGNOSIS — M25539 Pain in unspecified wrist: Secondary | ICD-10-CM

## 2011-01-23 DIAGNOSIS — T887XXA Unspecified adverse effect of drug or medicament, initial encounter: Secondary | ICD-10-CM

## 2011-01-23 DIAGNOSIS — E119 Type 2 diabetes mellitus without complications: Secondary | ICD-10-CM

## 2011-01-23 DIAGNOSIS — M25519 Pain in unspecified shoulder: Secondary | ICD-10-CM

## 2011-01-23 DIAGNOSIS — Z125 Encounter for screening for malignant neoplasm of prostate: Secondary | ICD-10-CM

## 2011-01-23 DIAGNOSIS — E538 Deficiency of other specified B group vitamins: Secondary | ICD-10-CM

## 2011-01-23 LAB — CBC WITH DIFFERENTIAL/PLATELET
Basophils Relative: 0.4 % (ref 0.0–3.0)
Eosinophils Absolute: 0.2 10*3/uL (ref 0.0–0.7)
Lymphs Abs: 2.6 10*3/uL (ref 0.7–4.0)
MCHC: 35 g/dL (ref 30.0–36.0)
MCV: 93.9 fl (ref 78.0–100.0)
Monocytes Absolute: 0.6 10*3/uL (ref 0.1–1.0)
Neutrophils Relative %: 56.1 % (ref 43.0–77.0)
Platelets: 270 10*3/uL (ref 150.0–400.0)
RBC: 4.89 Mil/uL (ref 4.22–5.81)

## 2011-01-23 LAB — HEPATIC FUNCTION PANEL
ALT: 39 U/L (ref 0–53)
Alkaline Phosphatase: 55 U/L (ref 39–117)
Bilirubin, Direct: 0.2 mg/dL (ref 0.0–0.3)
Total Protein: 6.9 g/dL (ref 6.0–8.3)

## 2011-01-23 LAB — BASIC METABOLIC PANEL
CO2: 28 mEq/L (ref 19–32)
Chloride: 104 mEq/L (ref 96–112)
Sodium: 137 mEq/L (ref 135–145)

## 2011-01-23 LAB — HEMOGLOBIN A1C: Hgb A1c MFr Bld: 6.1 % (ref 4.6–6.5)

## 2011-01-23 NOTE — Assessment & Plan Note (Signed)
R shoulder is tender w/ROM>L

## 2011-01-23 NOTE — Progress Notes (Signed)
  Subjective:    Patient ID: Joe [name], male    DOB: Oct 27, 1945, 65 y.o.   MRN: [id number]  HPI  The patient presents for a follow-up of  chronic hypertension, chronic dyslipidemia, type 2 diabetes controlled with medicines and OA. He lost wt on diet. Pain in R arm is 6/10 Wt Readings from Last 3 Encounters:  01/23/11 270 lb (122.471 kg)  12/03/10 278 lb (126.1 kg)  10/23/10 275 lb (124.739 kg)       Review of Systems  Constitutional: Negative for chills and fatigue.  HENT: Positive for neck pain. Negative for ear pain and postnasal drip.   Eyes: Negative for itching.  Gastrointestinal: Negative for blood in stool.  Musculoskeletal: Positive for myalgias, back pain, joint swelling and arthralgias.  Neurological: Positive for numbness (hands). Negative for tremors, syncope and weakness.  Psychiatric/Behavioral: Positive for sleep disturbance. Negative for suicidal ideas and behavioral problems. The patient is nervous/anxious.        Objective:   Physical Exam  Constitutional: He is oriented to person, place, and time. He appears well-developed.  HENT:  Mouth/Throat: Oropharynx is clear and moist.  Eyes: Conjunctivae are normal. Pupils are equal, round, and reactive to light. Right eye exhibits discharge.  Neck: Normal range of motion. No JVD present. No thyromegaly present.  Cardiovascular: Normal rate, regular rhythm, normal heart sounds and intact distal pulses.  Exam reveals no gallop and no friction rub.   No murmur heard. Pulmonary/Chest: Effort normal and breath sounds normal. No respiratory distress. He has no wheezes. He has no rales. He exhibits no tenderness.  Abdominal: Soft. Bowel sounds are normal. He exhibits no distension and no mass. There is no tenderness. There is no rebound and no guarding.  Musculoskeletal: Normal range of motion. He exhibits edema (trace edema LE) and tenderness (R shoulder is very tender and w/decr ROM).       Cervical and LS spine  is tender w/ROM  Lymphadenopathy:    He has no cervical adenopathy.  Neurological: He is alert and oriented to person, place, and time. He has normal reflexes. No cranial nerve deficit. He exhibits normal muscle tone. Coordination normal.  Skin: Skin is warm and dry. No rash noted.  Psychiatric: He has a normal mood and affect. His behavior is normal. Judgment and thought content normal.          Assessment & Plan:

## 2011-01-23 NOTE — Assessment & Plan Note (Signed)
On Rx 

## 2011-01-23 NOTE — Progress Notes (Signed)
  Subjective:    Patient ID: Joe [name], male    DOB: Oct 11, 1945, 65 y.o.   MRN: [id number]  HPI    Review of Systems     Objective:   Physical Exam      Lab Results  Component Value Date   WBC 7.4 05/05/2010   HGB 15.8 05/05/2010   HCT 44.4 05/05/2010   PLT 236.0 05/05/2010   CHOL 206* 10/04/2009   TRIG 93.0 10/04/2009   HDL 40.40 10/04/2009   LDLDIRECT 152.5 10/04/2009   ALT 29 05/05/2010   AST 24 05/05/2010   NA 139 05/05/2010   K 4.8 05/05/2010   CL 104 05/05/2010   CREATININE 1.0 05/05/2010   BUN 14 05/05/2010   CO2 28 05/05/2010   TSH 1.23 01/08/2010   PSA 1.88 01/05/2007   HGBA1C 5.7 01/08/2010    Assessment & Plan:

## 2011-01-23 NOTE — Assessment & Plan Note (Signed)
On  Rx  Worse in R ulnar region

## 2011-03-27 ENCOUNTER — Other Ambulatory Visit: Payer: Self-pay | Admitting: *Deleted

## 2011-03-27 MED ORDER — AMLODIPINE BESYLATE 5 MG PO TABS: 5.0000 mg | ORAL_TABLET | Freq: Every day | ORAL | Status: DC

## 2011-05-01 ENCOUNTER — Ambulatory Visit (INDEPENDENT_AMBULATORY_CARE_PROVIDER_SITE_OTHER): Payer: Medicare Other | Admitting: Internal Medicine

## 2011-05-01 ENCOUNTER — Encounter: Payer: Self-pay | Admitting: Internal Medicine

## 2011-05-01 DIAGNOSIS — J449 Chronic obstructive pulmonary disease, unspecified: Secondary | ICD-10-CM

## 2011-05-01 DIAGNOSIS — I10 Essential (primary) hypertension: Secondary | ICD-10-CM

## 2011-05-01 DIAGNOSIS — F17200 Nicotine dependence, unspecified, uncomplicated: Secondary | ICD-10-CM

## 2011-05-01 DIAGNOSIS — I2510 Atherosclerotic heart disease of native coronary artery without angina pectoris: Secondary | ICD-10-CM

## 2011-05-01 DIAGNOSIS — J019 Acute sinusitis, unspecified: Secondary | ICD-10-CM | POA: Insufficient documentation

## 2011-05-01 DIAGNOSIS — E785 Hyperlipidemia, unspecified: Secondary | ICD-10-CM

## 2011-05-01 DIAGNOSIS — E291 Testicular hypofunction: Secondary | ICD-10-CM

## 2011-05-01 DIAGNOSIS — Z720 Tobacco use: Secondary | ICD-10-CM

## 2011-05-01 MED ORDER — AMOXICILLIN 500 MG PO CAPS: 1000.0000 mg | ORAL_CAPSULE | Freq: Two times a day (BID) | ORAL | Status: AC

## 2011-05-01 NOTE — Assessment & Plan Note (Signed)
He is on DHEA

## 2011-05-01 NOTE — Assessment & Plan Note (Signed)
Continue with current prescription therapy as reflected on the Med list.  

## 2011-05-01 NOTE — Assessment & Plan Note (Signed)
He restarted to smoke 2012

## 2011-05-01 NOTE — Assessment & Plan Note (Signed)
Restarted to smoke in 2012. Discussed

## 2011-05-01 NOTE — Progress Notes (Signed)
  Subjective:    Patient ID: Joe [name], male    DOB: 12-25-1945, 65 y.o.   MRN: [id number]  HPI  The patient presents for a follow-up of  chronic hypertension, chronic dyslipidemia, type 2 diabetes controlled with medicines  C/o sinus sx C/o sweats  Review of Systems  Constitutional: Negative for appetite change, fatigue and unexpected weight change.  HENT: Positive for congestion. Negative for nosebleeds, sore throat, sneezing, trouble swallowing and neck pain.   Eyes: Negative for itching and visual disturbance.  Respiratory: Negative for cough.   Cardiovascular: Negative for chest pain, palpitations and leg swelling.  Gastrointestinal: Negative for nausea, diarrhea, blood in stool and abdominal distention.  Genitourinary: Negative for frequency and hematuria.  Musculoskeletal: Positive for back pain and gait problem. Negative for joint swelling.  Skin: Negative for rash.  Neurological: Negative for dizziness, tremors, speech difficulty and weakness.  Psychiatric/Behavioral: Negative for sleep disturbance, dysphoric mood and agitation. The patient is not nervous/anxious.        Objective:   Physical Exam  Constitutional: He is oriented to person, place, and time. He appears well-developed.       Obese  HENT:  Mouth/Throat: Oropharynx is clear and moist.  Eyes: Conjunctivae are normal. Pupils are equal, round, and reactive to light.  Neck: Normal range of motion. No JVD present. No thyromegaly present.  Cardiovascular: Normal rate, regular rhythm, normal heart sounds and intact distal pulses.  Exam reveals no gallop and no friction rub.   No murmur heard. Pulmonary/Chest: Effort normal and breath sounds normal. No respiratory distress. He has no wheezes. He has no rales. He exhibits no tenderness.  Abdominal: Soft. Bowel sounds are normal. He exhibits no distension and no mass. There is no tenderness. There is no rebound and no guarding.  Musculoskeletal: Normal range of  motion. He exhibits tenderness. He exhibits no edema.       B SI joints and LS is tender  Lymphadenopathy:    He has no cervical adenopathy.  Neurological: He is alert and oriented to person, place, and time. He has normal reflexes. No cranial nerve deficit. He exhibits normal muscle tone. Coordination normal.  Skin: Skin is warm and dry. No rash noted.  Psychiatric: He has a normal mood and affect. His behavior is normal. Judgment and thought content normal.    Lab Results  Component Value Date   WBC 7.9 01/23/2011   HGB 16.0 01/23/2011   HCT 45.9 01/23/2011   PLT 270.0 01/23/2011   CHOL 206* 10/04/2009   TRIG 93.0 10/04/2009   HDL 40.40 10/04/2009   LDLDIRECT 152.5 10/04/2009   ALT 39 01/23/2011   AST 26 01/23/2011   NA 137 01/23/2011   K 4.6 01/23/2011   CL 104 01/23/2011   CREATININE 1.2 01/23/2011   BUN 8 01/23/2011   CO2 28 01/23/2011   TSH 0.94 01/23/2011   PSA 2.17 01/23/2011   HGBA1C 6.1 01/23/2011         Assessment & Plan:    He had a coccyx x ray by ortho

## 2011-05-01 NOTE — Assessment & Plan Note (Signed)
Amoxicillin x10d 

## 2011-07-12 ENCOUNTER — Other Ambulatory Visit: Payer: Self-pay | Admitting: Cardiology

## 2011-08-05 ENCOUNTER — Ambulatory Visit (INDEPENDENT_AMBULATORY_CARE_PROVIDER_SITE_OTHER): Payer: Medicare Other | Admitting: Internal Medicine

## 2011-08-05 ENCOUNTER — Encounter: Payer: Self-pay | Admitting: Internal Medicine

## 2011-08-05 ENCOUNTER — Other Ambulatory Visit (INDEPENDENT_AMBULATORY_CARE_PROVIDER_SITE_OTHER): Payer: Medicare Other

## 2011-08-05 VITALS — BP 142/76 | HR 80 | Temp 98.5°F | Resp 16 | Wt 274.0 lb

## 2011-08-05 DIAGNOSIS — J449 Chronic obstructive pulmonary disease, unspecified: Secondary | ICD-10-CM

## 2011-08-05 DIAGNOSIS — J4489 Other specified chronic obstructive pulmonary disease: Secondary | ICD-10-CM

## 2011-08-05 DIAGNOSIS — Z720 Tobacco use: Secondary | ICD-10-CM

## 2011-08-05 DIAGNOSIS — IMO0001 Reserved for inherently not codable concepts without codable children: Secondary | ICD-10-CM

## 2011-08-05 DIAGNOSIS — F17200 Nicotine dependence, unspecified, uncomplicated: Secondary | ICD-10-CM

## 2011-08-05 DIAGNOSIS — R61 Generalized hyperhidrosis: Secondary | ICD-10-CM

## 2011-08-05 DIAGNOSIS — J019 Acute sinusitis, unspecified: Secondary | ICD-10-CM

## 2011-08-05 DIAGNOSIS — E291 Testicular hypofunction: Secondary | ICD-10-CM

## 2011-08-05 DIAGNOSIS — R7309 Other abnormal glucose: Secondary | ICD-10-CM

## 2011-08-05 DIAGNOSIS — R739 Hyperglycemia, unspecified: Secondary | ICD-10-CM

## 2011-08-05 DIAGNOSIS — I10 Essential (primary) hypertension: Secondary | ICD-10-CM

## 2011-08-05 DIAGNOSIS — E538 Deficiency of other specified B group vitamins: Secondary | ICD-10-CM

## 2011-08-05 LAB — URINALYSIS
Bilirubin Urine: NEGATIVE
Hgb urine dipstick: NEGATIVE
Ketones, ur: NEGATIVE
Nitrite: NEGATIVE
Total Protein, Urine: NEGATIVE
Urine Glucose: NEGATIVE
pH: 6 (ref 5.0–8.0)

## 2011-08-05 LAB — BASIC METABOLIC PANEL
CO2: 26 mEq/L (ref 19–32)
Chloride: 104 mEq/L (ref 96–112)
Creatinine, Ser: 1 mg/dL (ref 0.4–1.5)
Glucose, Bld: 102 mg/dL — ABNORMAL HIGH (ref 70–99)

## 2011-08-05 LAB — CBC WITH DIFFERENTIAL/PLATELET
Basophils Absolute: 0.1 10*3/uL (ref 0.0–0.1)
Basophils Relative: 0.7 % (ref 0.0–3.0)
Eosinophils Absolute: 0.2 10*3/uL (ref 0.0–0.7)
MCHC: 34.7 g/dL (ref 30.0–36.0)
MCV: 94.7 fl (ref 78.0–100.0)
Monocytes Absolute: 0.6 10*3/uL (ref 0.1–1.0)
Neutro Abs: 4.3 10*3/uL (ref 1.4–7.7)
Neutrophils Relative %: 55.8 % (ref 43.0–77.0)
RBC: 4.68 Mil/uL (ref 4.22–5.81)
RDW: 13.5 % (ref 11.5–14.6)

## 2011-08-05 MED ORDER — AZITHROMYCIN 250 MG PO TABS
ORAL_TABLET | ORAL | Status: AC
Start: 2011-08-05 — End: 2011-08-10

## 2011-08-05 MED ORDER — IPRATROPIUM-ALBUTEROL 20-100 MCG/ACT IN AERS: 2.0000 | INHALATION_SPRAY | Freq: Three times a day (TID) | RESPIRATORY_TRACT | Status: AC

## 2011-08-05 NOTE — Assessment & Plan Note (Signed)
See meds - zpac

## 2011-08-05 NOTE — Assessment & Plan Note (Signed)
Restarted to smoke in 2012. Discussed 12/12 - 1/2 ppd Advised to stop

## 2011-08-05 NOTE — Progress Notes (Signed)
  Subjective:    Patient ID: Joe [name], male    DOB: 19-Apr-1946, 65 y.o.   MRN: [id number]  HPI  The patient presents for a follow-up of  chronic hypertension, chronic dyslipidemia, CAD, COPD controlled with medicines.   HPI  C/o URI sx's x  5 days. C/o ST, cough, weakness. Not better with OTC medicines. Actually, the patient is getting worse. The patient did not sleep last night due to cough.  Review of Systems  Constitutional: Positive for fever, chills and fatigue.  HENT: Positive for congestion, rhinorrhea, sneezing and postnasal drip.   Eyes: Positive for photophobia and pain. Negative for discharge and visual disturbance.  Respiratory: Positive for cough and wheezing.   Positive for chest pain.  Gastrointestinal: Negative for vomiting, abdominal pain, diarrhea and abdominal distention.  Genitourinary: Negative for dysuria and difficulty urinating.  Skin: Negative for rash.  Neurological: Positive for dizziness, weakness and light-headedness.        Review of Systems     Objective:   Physical Exam  Constitutional: He is oriented to person, place, and time. He appears well-developed.  HENT:       eryth throat  Eyes: Conjunctivae are normal. Pupils are equal, round, and reactive to light.  Neck: Normal range of motion. No JVD present. No thyromegaly present.  Cardiovascular: Normal rate, regular rhythm, normal heart sounds and intact distal pulses.  Exam reveals no gallop and no friction rub.   No murmur heard. Pulmonary/Chest: Effort normal and breath sounds normal. No respiratory distress. He has no wheezes. He has no rales. He exhibits no tenderness.  Abdominal: Soft. Bowel sounds are normal. He exhibits no distension and no mass. There is no tenderness. There is no rebound and no guarding.  Musculoskeletal: Normal range of motion. He exhibits no edema and no tenderness.  Lymphadenopathy:    He has no cervical adenopathy.  Neurological: He is alert and  oriented to person, place, and time. He has normal reflexes. No cranial nerve deficit. He exhibits normal muscle tone. Coordination normal.  Skin: Skin is warm and dry. No rash noted.  Psychiatric: He has a normal mood and affect. His behavior is normal. Judgment and thought content normal.          Assessment & Plan:

## 2011-08-05 NOTE — Assessment & Plan Note (Signed)
Continue with current prescription therapy as reflected on the Med list.  

## 2011-08-05 NOTE — Assessment & Plan Note (Signed)
Start Combivent Respimat I personally provided the respimat inhaler use teaching. After the teaching patient was able to demonstrate it's use effectively. All questions were answered

## 2011-08-05 NOTE — Assessment & Plan Note (Signed)
Recheck testosterone 

## 2011-08-30 ENCOUNTER — Other Ambulatory Visit: Payer: Self-pay | Admitting: Internal Medicine

## 2011-10-07 ENCOUNTER — Ambulatory Visit (INDEPENDENT_AMBULATORY_CARE_PROVIDER_SITE_OTHER)
Admission: RE | Admit: 2011-10-07 | Discharge: 2011-10-07 | Disposition: A | Discharge status: Home / Self Care | Payer: Medicare Other | Source: Ambulatory Visit | Attending: Internal Medicine | Admitting: Internal Medicine

## 2011-10-07 ENCOUNTER — Encounter: Payer: Self-pay | Admitting: Internal Medicine

## 2011-10-07 ENCOUNTER — Ambulatory Visit (INDEPENDENT_AMBULATORY_CARE_PROVIDER_SITE_OTHER): Payer: Medicare Other | Admitting: Internal Medicine

## 2011-10-07 VITALS — BP 130/90 | HR 80 | Temp 98.6°F | Resp 16 | Wt 274.0 lb

## 2011-10-07 DIAGNOSIS — I10 Essential (primary) hypertension: Secondary | ICD-10-CM

## 2011-10-07 DIAGNOSIS — E538 Deficiency of other specified B group vitamins: Secondary | ICD-10-CM

## 2011-10-07 DIAGNOSIS — R1032 Left lower quadrant pain: Secondary | ICD-10-CM | POA: Insufficient documentation

## 2011-10-07 DIAGNOSIS — E291 Testicular hypofunction: Secondary | ICD-10-CM

## 2011-10-07 MED ORDER — CELECOXIB 100 MG PO CAPS: 100.0000 mg | ORAL_CAPSULE | Freq: Two times a day (BID) | ORAL | Status: AC

## 2011-10-07 NOTE — Assessment & Plan Note (Signed)
2/13 - severe - strain and contusion Stretch X ray Vicodin prn

## 2011-10-07 NOTE — Progress Notes (Signed)
  Subjective:    Patient ID: Joe [name], male    DOB: Sep 17, 1945, 66 y.o.   MRN: [id number]  HPI  Tripped over dog carrying 60 lbs of tools and fell down the steps (4) on [place] C/o L groin pain  Review of Systems  Constitutional: Negative for appetite change, fatigue and unexpected weight change.  HENT: Negative for nosebleeds, congestion, sore throat, sneezing, trouble swallowing and neck pain.   Eyes: Negative for itching and visual disturbance.  Respiratory: Negative for cough.   Cardiovascular: Negative for chest pain, palpitations and leg swelling.  Gastrointestinal: Negative for nausea, diarrhea, blood in stool and abdominal distention.  Genitourinary: Negative for frequency and hematuria.  Musculoskeletal: Positive for back pain and gait problem. Negative for joint swelling.  Skin: Negative for rash.  Neurological: Negative for dizziness, tremors, speech difficulty and weakness.  Psychiatric/Behavioral: Negative for sleep disturbance, dysphoric mood and agitation. The patient is not nervous/anxious.        Objective:   Physical Exam  Constitutional: He is oriented to person, place, and time. He appears well-developed.  HENT:  Mouth/Throat: Oropharynx is clear and moist.  Eyes: Conjunctivae are normal. Pupils are equal, round, and reactive to light.  Neck: Normal range of motion. No JVD present. No thyromegaly present.  Cardiovascular: Normal rate, regular rhythm, normal heart sounds and intact distal pulses.  Exam reveals no gallop and no friction rub.   No murmur heard. Pulmonary/Chest: Effort normal and breath sounds normal. No respiratory distress. He has no wheezes. He has no rales. He exhibits no tenderness.  Abdominal: Soft. Bowel sounds are normal. He exhibits no distension and no mass. There is no tenderness. There is no rebound and no guarding.  Musculoskeletal: Normal range of motion. He exhibits tenderness (L groin pain w/palpation; pelvis and hip tender w/ROM;  LS is tender). He exhibits no edema.  Lymphadenopathy:    He has no cervical adenopathy.  Neurological: He is alert and oriented to person, place, and time. He has normal reflexes. No cranial nerve deficit. He exhibits normal muscle tone. Coordination normal.  Skin: Skin is warm and dry. No rash noted.  Psychiatric: He has a normal mood and affect. His behavior is normal. Judgment and thought content normal.          Assessment & Plan:

## 2011-10-09 NOTE — Assessment & Plan Note (Signed)
We discussed available prescription therapy

## 2011-10-09 NOTE — Assessment & Plan Note (Signed)
Continue with current prescription therapy as reflected on the Med list.  

## 2011-10-12 ENCOUNTER — Telehealth: Payer: Self-pay | Admitting: *Deleted

## 2011-10-12 NOTE — Telephone Encounter (Signed)
Message copied by Merrilyn Puma on Mon Oct 12, 2011  8:28 AM ------      Message from: Janeal Holmes      Created: Thu Oct 08, 2011  1:09 PM       Misty Stanley, please, inform patient that all labs are OK      Thank you!

## 2011-10-12 NOTE — Telephone Encounter (Signed)
Left detailed mess informing pt of below.  

## 2011-10-20 ENCOUNTER — Other Ambulatory Visit: Payer: Self-pay | Admitting: Cardiology

## 2011-11-02 ENCOUNTER — Ambulatory Visit (INDEPENDENT_AMBULATORY_CARE_PROVIDER_SITE_OTHER): Payer: Medicare Other | Admitting: Internal Medicine

## 2011-11-02 ENCOUNTER — Encounter: Payer: Self-pay | Admitting: Internal Medicine

## 2011-11-02 DIAGNOSIS — R1032 Left lower quadrant pain: Secondary | ICD-10-CM

## 2011-11-02 DIAGNOSIS — F17200 Nicotine dependence, unspecified, uncomplicated: Secondary | ICD-10-CM

## 2011-11-02 DIAGNOSIS — K5909 Other constipation: Secondary | ICD-10-CM

## 2011-11-02 DIAGNOSIS — E559 Vitamin D deficiency, unspecified: Secondary | ICD-10-CM

## 2011-11-02 DIAGNOSIS — E538 Deficiency of other specified B group vitamins: Secondary | ICD-10-CM

## 2011-11-02 DIAGNOSIS — Z720 Tobacco use: Secondary | ICD-10-CM

## 2011-11-02 MED ORDER — LINACLOTIDE 145 MCG PO CAPS: 1.0000 | ORAL_CAPSULE | ORAL | Status: DC

## 2011-11-02 MED ORDER — FUROSEMIDE 20 MG PO TABS: 20.0000 mg | ORAL_TABLET | Freq: Every day | ORAL | Status: DC | PRN

## 2011-11-02 NOTE — Assessment & Plan Note (Signed)
Start Linzess. 

## 2011-11-02 NOTE — Assessment & Plan Note (Signed)
Discussed again 

## 2011-11-02 NOTE — Assessment & Plan Note (Signed)
Re-start Rx 

## 2011-11-02 NOTE — Assessment & Plan Note (Signed)
Better - X ray - no fx

## 2011-11-02 NOTE — Progress Notes (Signed)
Patient ID: Joe [name], male   DOB: 31-Mar-1946, 66 y.o.   MRN: [id number]  Subjective:    Patient ID: Joe [name], male    DOB: [birth year], 66 y.o.   MRN: [id number]  HPI   C/o L groin pain following his fall 1 mo ago - better F/u HTN, OA, constipation. Amitiza did cause pain in lower abd. Senakot makes it too soft...  Review of Systems  Constitutional: Negative for appetite change, fatigue and unexpected weight change.  HENT: Negative for nosebleeds, congestion, sore throat, sneezing, trouble swallowing and neck pain.   Eyes: Negative for itching and visual disturbance.  Respiratory: Negative for cough.   Cardiovascular: Negative for chest pain, palpitations and leg swelling.  Gastrointestinal: Negative for nausea, diarrhea, blood in stool and abdominal distention.  Genitourinary: Negative for frequency and hematuria.  Musculoskeletal: Positive for back pain and gait problem. Negative for joint swelling.  Skin: Negative for rash.  Neurological: Negative for dizziness, tremors, speech difficulty and weakness.  Psychiatric/Behavioral: Negative for sleep disturbance, dysphoric mood and agitation. The patient is not nervous/anxious.    Wt Readings from Last 3 Encounters:  11/02/11 273 lb (123.832 kg)  10/07/11 274 lb (124.286 kg)  08/05/11 274 lb (124.286 kg)   BP Readings from Last 3 Encounters:  11/02/11 124/72  10/07/11 130/90  08/05/11 142/76        Objective:   Physical Exam  Constitutional: He is oriented to person, place, and time. He appears well-developed.  HENT:  Mouth/Throat: Oropharynx is clear and moist.  Eyes: Conjunctivae are normal. Pupils are equal, round, and reactive to light.  Neck: Normal range of motion. No JVD present. No thyromegaly present.  Cardiovascular: Normal rate, regular rhythm, normal heart sounds and intact distal pulses.  Exam reveals no gallop and no friction rub.   No murmur heard. Pulmonary/Chest: Effort normal and breath  sounds normal. No respiratory distress. He has no wheezes. He has no rales. He exhibits no tenderness.  Abdominal: Soft. Bowel sounds are normal. He exhibits no distension and no mass. There is no tenderness. There is no rebound and no guarding.  Musculoskeletal: Normal range of motion. He exhibits tenderness (L groin pain w/palpation; pelvis and hip tender w/ROM; LS is tender). He exhibits no edema.  Lymphadenopathy:    He has no cervical adenopathy.  Neurological: He is alert and oriented to person, place, and time. He has normal reflexes. No cranial nerve deficit. He exhibits normal muscle tone. Coordination normal.  Skin: Skin is warm and dry. No rash noted.  Psychiatric: He has a normal mood and affect. His behavior is normal. Judgment and thought content normal.   Lab Results  Component Value Date   WBC 7.8 08/05/2011   HGB 15.4 08/05/2011   HCT 44.4 08/05/2011   PLT 259.0 08/05/2011   GLUCOSE 102* 08/05/2011   CHOL 206* 10/04/2009   TRIG 93.0 10/04/2009   HDL 40.40 10/04/2009   LDLDIRECT 152.5 10/04/2009   LDLCALC 136* 02/18/2009   ALT 39 01/23/2011   AST 26 01/23/2011   NA 138 08/05/2011   K 4.2 08/05/2011   CL 104 08/05/2011   CREATININE 1.0 08/05/2011   BUN 13 08/05/2011   CO2 26 08/05/2011   TSH 0.94 01/23/2011   PSA 2.17 01/23/2011   HGBA1C 5.9 08/05/2011     X ray hip OA      Assessment & Plan:

## 2011-12-01 ENCOUNTER — Encounter: Payer: Self-pay | Admitting: Cardiology

## 2011-12-01 ENCOUNTER — Ambulatory Visit (INDEPENDENT_AMBULATORY_CARE_PROVIDER_SITE_OTHER): Payer: Medicare Other | Admitting: Cardiology

## 2011-12-01 VITALS — BP 119/70 | HR 74 | Ht 69.0 in | Wt 269.0 lb

## 2011-12-01 DIAGNOSIS — I10 Essential (primary) hypertension: Secondary | ICD-10-CM

## 2011-12-01 DIAGNOSIS — Z720 Tobacco use: Secondary | ICD-10-CM

## 2011-12-01 DIAGNOSIS — E785 Hyperlipidemia, unspecified: Secondary | ICD-10-CM

## 2011-12-01 DIAGNOSIS — F17200 Nicotine dependence, unspecified, uncomplicated: Secondary | ICD-10-CM

## 2011-12-01 DIAGNOSIS — I2510 Atherosclerotic heart disease of native coronary artery without angina pectoris: Secondary | ICD-10-CM
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
## 2011-12-01 NOTE — Assessment & Plan Note (Signed)
Continue diet. Intolerant to statins. 

## 2011-12-01 NOTE — Assessment & Plan Note (Signed)
Patient counseled on discontinuing. 

## 2011-12-01 NOTE — Assessment & Plan Note (Signed)
Continue aspirin. Intolerant to statins. We discussed a Myoview but he declined.

## 2011-12-01 NOTE — Assessment & Plan Note (Signed)
Blood pressure controlled. Continue present medications. Potassium and renal function monitored by primary care. 

## 2011-12-01 NOTE — Patient Instructions (Signed)
The current medical regimen is effective;  continue present plan and medications.  Follow up in 1 year with Dr Crenshaw.  You will receive a letter in the mail 2 months before you are due.  Please call us when you receive this letter to schedule your follow up appointment.  

## 2012-02-01 ENCOUNTER — Ambulatory Visit (INDEPENDENT_AMBULATORY_CARE_PROVIDER_SITE_OTHER): Payer: Medicare Other | Admitting: Internal Medicine

## 2012-02-01 ENCOUNTER — Encounter: Payer: Self-pay | Admitting: Internal Medicine

## 2012-02-01 VITALS — BP 130/64 | HR 84 | Temp 97.9°F | Resp 16 | Wt 274.0 lb

## 2012-02-01 DIAGNOSIS — E291 Testicular hypofunction: Secondary | ICD-10-CM

## 2012-02-01 DIAGNOSIS — M2550 Pain in unspecified joint: Secondary | ICD-10-CM

## 2012-02-01 DIAGNOSIS — E538 Deficiency of other specified B group vitamins: Secondary | ICD-10-CM

## 2012-02-01 DIAGNOSIS — E559 Vitamin D deficiency, unspecified: Secondary | ICD-10-CM

## 2012-02-01 DIAGNOSIS — I10 Essential (primary) hypertension: Secondary | ICD-10-CM

## 2012-02-01 DIAGNOSIS — M1990 Unspecified osteoarthritis, unspecified site: Secondary | ICD-10-CM

## 2012-02-01 DIAGNOSIS — I2510 Atherosclerotic heart disease of native coronary artery without angina pectoris: Secondary | ICD-10-CM

## 2012-02-01 NOTE — Assessment & Plan Note (Signed)
Not using testosterone 

## 2012-02-01 NOTE — Patient Instructions (Signed)
Try Amlodipine 1/2 tab a day

## 2012-02-01 NOTE — Assessment & Plan Note (Signed)
Continue with current prescription therapy as reflected on the Med list.  

## 2012-02-01 NOTE — Progress Notes (Signed)
  Subjective:    Patient ID: Joe [name], male    DOB: 02/17/1946, 66 y.o.   MRN: [id number]  HPI   C/o L groin pain following his fall 1 mo ago - resolved  F/u HTN, OA, constipation. Amitiza did cause pain in lower abd. Senakot makes it too soft.Marland KitchenMarland KitchenLinzess is working well.Marland KitchenMarland KitchenC/o edema lately  Review of Systems  Constitutional: Negative for appetite change, fatigue and unexpected weight change.  HENT: Negative for nosebleeds, congestion, sore throat, sneezing, trouble swallowing and neck pain.   Eyes: Negative for itching and visual disturbance.  Respiratory: Negative for cough.   Cardiovascular: Negative for chest pain, palpitations and leg swelling.  Gastrointestinal: Negative for nausea, diarrhea, blood in stool and abdominal distention.  Genitourinary: Negative for frequency and hematuria.  Musculoskeletal: Positive for back pain and gait problem. Negative for joint swelling.  Skin: Negative for rash.  Neurological: Negative for dizziness, tremors, speech difficulty and weakness.  Psychiatric/Behavioral: Negative for sleep disturbance, dysphoric mood and agitation. The patient is not nervous/anxious.    Wt Readings from Last 3 Encounters:  02/01/12 274 lb (124.286 kg)  12/01/11 269 lb (122.018 kg)  11/02/11 273 lb (123.832 kg)   BP Readings from Last 3 Encounters:  02/01/12 130/64  12/01/11 119/70  11/02/11 124/72        Objective:   Physical Exam  Constitutional: He is oriented to person, place, and time. He appears well-developed. No distress.       Obese   HENT:  Mouth/Throat: Oropharynx is clear and moist.  Eyes: Conjunctivae are normal. Pupils are equal, round, and reactive to light.  Neck: Normal range of motion. No JVD present. No thyromegaly present.  Cardiovascular: Normal rate, regular rhythm, normal heart sounds and intact distal pulses.  Exam reveals no gallop and no friction rub.   No murmur heard. Pulmonary/Chest: Effort normal and breath sounds  normal. No respiratory distress. He has no wheezes. He has no rales. He exhibits no tenderness.  Abdominal: Soft. Bowel sounds are normal. He exhibits no distension and no mass. There is no tenderness. There is no rebound and no guarding.  Musculoskeletal: Normal range of motion. He exhibits edema (1+ B ankles) and tenderness ( LS is tender w/ROM).  Lymphadenopathy:    He has no cervical adenopathy.  Neurological: He is alert and oriented to person, place, and time. He has normal reflexes. No cranial nerve deficit. He exhibits normal muscle tone. Coordination normal.  Skin: Skin is warm and dry. No rash noted.  Psychiatric: He has a normal mood and affect. His behavior is normal. Judgment and thought content normal.   Lab Results  Component Value Date   WBC 7.8 08/05/2011   HGB 15.4 08/05/2011   HCT 44.4 08/05/2011   PLT 259.0 08/05/2011   GLUCOSE 102* 08/05/2011   CHOL 206* 10/04/2009   TRIG 93.0 10/04/2009   HDL 40.40 10/04/2009   LDLDIRECT 152.5 10/04/2009   LDLCALC 136* 02/18/2009   ALT 39 01/23/2011   AST 26 01/23/2011   NA 138 08/05/2011   K 4.2 08/05/2011   CL 104 08/05/2011   CREATININE 1.0 08/05/2011   BUN 13 08/05/2011   CO2 26 08/05/2011   TSH 0.94 01/23/2011   PSA 2.17 01/23/2011   HGBA1C 5.9 08/05/2011          Assessment & Plan:

## 2012-03-29 ENCOUNTER — Other Ambulatory Visit: Payer: Self-pay | Admitting: *Deleted

## 2012-03-29 MED ORDER — OLMESARTAN MEDOXOMIL 20 MG PO TABS: 20.0000 mg | ORAL_TABLET | Freq: Every day | ORAL | Status: DC

## 2012-03-29 NOTE — Telephone Encounter (Signed)
Fax Received. Refill Completed. Josh Nicolosi Chowoe (R.M.A)   

## 2012-04-28 ENCOUNTER — Emergency Department (HOSPITAL_COMMUNITY)
Admission: EM | Admit: 2012-04-28 | Discharge: 2012-04-28 | Disposition: A | Discharge status: Home / Self Care | Payer: Medicare Other | Attending: Emergency Medicine | Admitting: Emergency Medicine

## 2012-04-28 ENCOUNTER — Encounter (HOSPITAL_COMMUNITY): Payer: Self-pay | Admitting: Emergency Medicine

## 2012-04-28 ENCOUNTER — Emergency Department (HOSPITAL_COMMUNITY): Payer: Medicare Other

## 2012-04-28 DIAGNOSIS — I2510 Atherosclerotic heart disease of native coronary artery without angina pectoris: Secondary | ICD-10-CM | POA: Insufficient documentation

## 2012-04-28 DIAGNOSIS — R109 Unspecified abdominal pain: Secondary | ICD-10-CM | POA: Insufficient documentation

## 2012-04-28 DIAGNOSIS — I10 Essential (primary) hypertension: Secondary | ICD-10-CM | POA: Insufficient documentation

## 2012-04-28 DIAGNOSIS — M1990 Unspecified osteoarthritis, unspecified site: Secondary | ICD-10-CM | POA: Insufficient documentation

## 2012-04-28 DIAGNOSIS — J449 Chronic obstructive pulmonary disease, unspecified: Secondary | ICD-10-CM | POA: Insufficient documentation

## 2012-04-28 DIAGNOSIS — R1013 Epigastric pain: Secondary | ICD-10-CM

## 2012-04-28 DIAGNOSIS — E785 Hyperlipidemia, unspecified: Secondary | ICD-10-CM | POA: Insufficient documentation

## 2012-04-28 DIAGNOSIS — J4489 Other specified chronic obstructive pulmonary disease: Secondary | ICD-10-CM | POA: Insufficient documentation

## 2012-04-28 DIAGNOSIS — F17200 Nicotine dependence, unspecified, uncomplicated: Secondary | ICD-10-CM | POA: Insufficient documentation

## 2012-04-28 LAB — CBC WITH DIFFERENTIAL/PLATELET
Basophils Absolute: 0.1 10*3/uL (ref 0.0–0.1)
Basophils Relative: 1 % (ref 0–1)
Eosinophils Absolute: 0.2 10*3/uL (ref 0.0–0.7)
Eosinophils Relative: 3 % (ref 0–5)
MCH: 32.2 pg (ref 26.0–34.0)
MCV: 92.1 fL (ref 78.0–100.0)
Neutrophils Relative %: 57 % (ref 43–77)
Platelets: 254 10*3/uL (ref 150–400)
RDW: 13.5 % (ref 11.5–15.5)

## 2012-04-28 LAB — BASIC METABOLIC PANEL
CO2: 26 mEq/L (ref 19–32)
Glucose, Bld: 104 mg/dL — ABNORMAL HIGH (ref 70–99)
Potassium: 4.4 mEq/L (ref 3.5–5.1)
Sodium: 136 mEq/L (ref 135–145)

## 2012-04-28 LAB — POCT I-STAT TROPONIN I: Troponin i, poc: 0.01 ng/mL (ref 0.00–0.08)

## 2012-04-28 NOTE — ED Notes (Signed)
PA at bedside.

## 2012-04-28 NOTE — ED Notes (Signed)
Pt ambulatory at discharge, alert and oriented x 4.

## 2012-04-28 NOTE — ED Notes (Signed)
Pt ambulated to restroom independently.

## 2012-04-28 NOTE — ED Notes (Signed)
MD at bedside. 
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
## 2012-04-28 NOTE — ED Notes (Signed)
PT. REPORTS EPIGASTRIC PAIN " BURNING" / " ACID"  ONSET THIS MORNING WITH NAUSEA AND DIAPHORESIS , STATES HISTORY OF CAD / CABG . DENIES SOB .

## 2012-04-28 NOTE — ED Notes (Signed)
Pt. Reports GERD pain. States "i have a hiatal hernia and sometimes I get acid reflux that mimics a MI. I get sweaty and burning chest pain. I know this is my acid reflux". Hx of GERD and MI.

## 2012-04-30 NOTE — ED Provider Notes (Signed)
Medical screening examination/treatment/procedure(s) were performed by non-physician practitioner and as supervising physician I was immediately available for consultation/collaboration.  Burlene Montecalvo, MD 04/30/12 0048 

## 2012-05-05 ENCOUNTER — Other Ambulatory Visit (INDEPENDENT_AMBULATORY_CARE_PROVIDER_SITE_OTHER): Payer: Medicare Other

## 2012-05-05 DIAGNOSIS — M1990 Unspecified osteoarthritis, unspecified site: Secondary | ICD-10-CM

## 2012-05-05 DIAGNOSIS — E291 Testicular hypofunction: Secondary | ICD-10-CM

## 2012-05-05 DIAGNOSIS — Z79899 Other long term (current) drug therapy: Secondary | ICD-10-CM

## 2012-05-05 DIAGNOSIS — I10 Essential (primary) hypertension: Secondary | ICD-10-CM

## 2012-05-05 DIAGNOSIS — E559 Vitamin D deficiency, unspecified: Secondary | ICD-10-CM

## 2012-05-05 DIAGNOSIS — I2510 Atherosclerotic heart disease of native coronary artery without angina pectoris: Secondary | ICD-10-CM

## 2012-05-05 DIAGNOSIS — E538 Deficiency of other specified B group vitamins: Secondary | ICD-10-CM

## 2012-05-05 DIAGNOSIS — Z7901 Long term (current) use of anticoagulants: Secondary | ICD-10-CM

## 2012-05-05 DIAGNOSIS — M2550 Pain in unspecified joint: Secondary | ICD-10-CM

## 2012-05-05 LAB — BASIC METABOLIC PANEL
BUN: 12 mg/dL (ref 6–23)
CO2: 24 mEq/L (ref 19–32)
Calcium: 9.3 mg/dL (ref 8.4–10.5)
Creatinine, Ser: 1.1 mg/dL (ref 0.4–1.5)
Glucose, Bld: 101 mg/dL — ABNORMAL HIGH (ref 70–99)

## 2012-05-09 ENCOUNTER — Ambulatory Visit (INDEPENDENT_AMBULATORY_CARE_PROVIDER_SITE_OTHER): Payer: Medicare Other | Admitting: Internal Medicine

## 2012-05-09 ENCOUNTER — Encounter: Payer: Self-pay | Admitting: Internal Medicine

## 2012-05-09 VITALS — BP 128/70 | HR 80 | Temp 98.4°F | Resp 16 | Wt 271.0 lb

## 2012-05-09 DIAGNOSIS — E538 Deficiency of other specified B group vitamins: Secondary | ICD-10-CM

## 2012-05-09 DIAGNOSIS — I10 Essential (primary) hypertension: Secondary | ICD-10-CM

## 2012-05-09 DIAGNOSIS — E559 Vitamin D deficiency, unspecified: Secondary | ICD-10-CM

## 2012-05-09 DIAGNOSIS — M79604 Pain in right leg: Secondary | ICD-10-CM | POA: Insufficient documentation

## 2012-05-09 DIAGNOSIS — M79609 Pain in unspecified limb: Secondary | ICD-10-CM

## 2012-05-09 DIAGNOSIS — F17200 Nicotine dependence, unspecified, uncomplicated: Secondary | ICD-10-CM

## 2012-05-09 DIAGNOSIS — M79605 Pain in left leg: Secondary | ICD-10-CM | POA: Insufficient documentation

## 2012-05-09 DIAGNOSIS — I2510 Atherosclerotic heart disease of native coronary artery without angina pectoris: Secondary | ICD-10-CM

## 2012-05-09 DIAGNOSIS — M1990 Unspecified osteoarthritis, unspecified site: Secondary | ICD-10-CM

## 2012-05-09 DIAGNOSIS — E291 Testicular hypofunction: Secondary | ICD-10-CM

## 2012-05-09 DIAGNOSIS — G250 Essential tremor: Secondary | ICD-10-CM

## 2012-05-09 DIAGNOSIS — M2550 Pain in unspecified joint: Secondary | ICD-10-CM

## 2012-05-09 DIAGNOSIS — M542 Cervicalgia: Secondary | ICD-10-CM

## 2012-05-09 DIAGNOSIS — Z720 Tobacco use: Secondary | ICD-10-CM

## 2012-05-09 MED ORDER — PRIMIDONE 50 MG PO TABS: 50.0000 mg | ORAL_TABLET | Freq: Three times a day (TID) | ORAL | Status: DC

## 2012-05-09 MED ORDER — GABAPENTIN 100 MG PO CAPS: ORAL_CAPSULE | ORAL | Status: DC

## 2012-05-09 NOTE — Assessment & Plan Note (Signed)
Continue with current prescription therapy as reflected on the Med list.  

## 2012-05-09 NOTE — Assessment & Plan Note (Signed)
Chronic. He has sweats. Not using testosterone

## 2012-05-09 NOTE — Assessment & Plan Note (Signed)
Needs to stop 

## 2012-05-09 NOTE — Assessment & Plan Note (Signed)
He is using Aleve prn

## 2012-05-09 NOTE — Assessment & Plan Note (Signed)
Will try Primidone 
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
## 2012-05-09 NOTE — Assessment & Plan Note (Signed)
9/13 - poss MSK. He declined arterial doppler at the moment. Stretch Tramadol prn

## 2012-05-09 NOTE — Assessment & Plan Note (Signed)
Worse on the L (9/13) Tramadol prn

## 2012-05-27 ENCOUNTER — Other Ambulatory Visit: Payer: Self-pay | Admitting: Cardiology

## 2012-05-30 NOTE — Telephone Encounter (Signed)
..   Requested Prescriptions   Pending Prescriptions Disp Refills  . amLODipine (NORVASC) 5 MG tablet [Pharmacy Med Name: AMLODIPINE BESYLATE 5 MG TAB] 30 tablet 6    Sig: TAKE 1 TABLET BY MOUTH EVERY DAY

## 2012-08-22 ENCOUNTER — Ambulatory Visit (INDEPENDENT_AMBULATORY_CARE_PROVIDER_SITE_OTHER): Payer: Medicare Other | Admitting: Internal Medicine

## 2012-08-22 ENCOUNTER — Encounter: Payer: Self-pay | Admitting: Internal Medicine

## 2012-08-22 VITALS — BP 120/60 | HR 76 | Temp 98.4°F | Resp 16 | Wt 267.0 lb

## 2012-08-22 DIAGNOSIS — I10 Essential (primary) hypertension: Secondary | ICD-10-CM

## 2012-08-22 DIAGNOSIS — L570 Actinic keratosis: Secondary | ICD-10-CM

## 2012-08-22 DIAGNOSIS — Z23 Encounter for immunization: Secondary | ICD-10-CM

## 2012-08-22 DIAGNOSIS — R51 Headache: Secondary | ICD-10-CM

## 2012-08-22 DIAGNOSIS — E538 Deficiency of other specified B group vitamins: Secondary | ICD-10-CM

## 2012-08-22 DIAGNOSIS — M542 Cervicalgia: Secondary | ICD-10-CM

## 2012-08-22 DIAGNOSIS — I2510 Atherosclerotic heart disease of native coronary artery without angina pectoris: Secondary | ICD-10-CM

## 2012-08-22 DIAGNOSIS — G252 Other specified forms of tremor: Secondary | ICD-10-CM

## 2012-08-22 DIAGNOSIS — J209 Acute bronchitis, unspecified: Secondary | ICD-10-CM

## 2012-08-22 DIAGNOSIS — G250 Essential tremor: Secondary | ICD-10-CM

## 2012-08-22 MED ORDER — AZITHROMYCIN 250 MG PO TABS: ORAL_TABLET | ORAL | Status: DC

## 2012-08-22 MED ORDER — DIAZEPAM 2 MG PO TABS: 2.0000 mg | ORAL_TABLET | Freq: Three times a day (TID) | ORAL | Status: DC | PRN

## 2012-08-22 MED ORDER — FUROSEMIDE 20 MG PO TABS: 20.0000 mg | ORAL_TABLET | Freq: Every day | ORAL | Status: DC | PRN

## 2012-08-22 NOTE — Assessment & Plan Note (Signed)
Cryo x 2

## 2012-08-22 NOTE — Assessment & Plan Note (Signed)
Continue with current prescription therapy as reflected on the Med list.  

## 2012-08-22 NOTE — Assessment & Plan Note (Signed)
No change 

## 2012-08-22 NOTE — Assessment & Plan Note (Signed)
Discussed - trial of Primidone has failed Trial of a low dose Valium prn

## 2012-08-22 NOTE — Patient Instructions (Addendum)
   Postprocedure instructions :     Keep the wounds clean. You can wash them with liquid soap and water. Pat dry with gauze or a Kleenex tissue  Before applying antibiotic ointment and a Band-Aid.   You need to report immediately  if  any signs of infection develop.    
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
## 2012-08-22 NOTE — Assessment & Plan Note (Signed)
See meds 

## 2012-08-22 NOTE — Assessment & Plan Note (Signed)
Worse due to sinusitis - treat sinusitis

## 2012-10-22 ENCOUNTER — Other Ambulatory Visit: Payer: Self-pay | Admitting: Cardiology

## 2012-11-21 ENCOUNTER — Ambulatory Visit (INDEPENDENT_AMBULATORY_CARE_PROVIDER_SITE_OTHER): Payer: Medicare Other | Admitting: Internal Medicine

## 2012-11-21 ENCOUNTER — Encounter: Payer: Self-pay | Admitting: Internal Medicine

## 2012-11-21 ENCOUNTER — Other Ambulatory Visit (INDEPENDENT_AMBULATORY_CARE_PROVIDER_SITE_OTHER): Payer: Medicare Other

## 2012-11-21 VITALS — BP 130/80 | HR 88 | Temp 98.0°F | Resp 16 | Wt 271.0 lb

## 2012-11-21 DIAGNOSIS — I2510 Atherosclerotic heart disease of native coronary artery without angina pectoris: Secondary | ICD-10-CM

## 2012-11-21 DIAGNOSIS — J329 Chronic sinusitis, unspecified: Secondary | ICD-10-CM

## 2012-11-21 DIAGNOSIS — E538 Deficiency of other specified B group vitamins: Secondary | ICD-10-CM

## 2012-11-21 DIAGNOSIS — G250 Essential tremor: Secondary | ICD-10-CM

## 2012-11-21 DIAGNOSIS — G252 Other specified forms of tremor: Secondary | ICD-10-CM

## 2012-11-21 DIAGNOSIS — M2550 Pain in unspecified joint: Secondary | ICD-10-CM

## 2012-11-21 DIAGNOSIS — E785 Hyperlipidemia, unspecified: Secondary | ICD-10-CM

## 2012-11-21 DIAGNOSIS — E559 Vitamin D deficiency, unspecified: Secondary | ICD-10-CM

## 2012-11-21 DIAGNOSIS — F411 Generalized anxiety disorder: Secondary | ICD-10-CM

## 2012-11-21 LAB — BASIC METABOLIC PANEL
BUN: 13 mg/dL (ref 6–23)
Calcium: 8.9 mg/dL (ref 8.4–10.5)
Creatinine, Ser: 0.9 mg/dL (ref 0.4–1.5)
GFR: 87.93 mL/min (ref >60.00)
Glucose, Bld: 101 mg/dL — ABNORMAL HIGH (ref 70–99)
Potassium: 4.1 mEq/L (ref 3.5–5.1)

## 2012-11-21 MED ORDER — CEFUROXIME AXETIL 500 MG PO TABS: 500.0000 mg | ORAL_TABLET | Freq: Two times a day (BID) | ORAL | Status: DC

## 2012-11-21 MED ORDER — METHYLPREDNISOLONE ACETATE 80 MG/ML IJ SUSP
80.0000 mg | Freq: Once | INTRAMUSCULAR | Status: AC
  Administered 2012-11-21: 80 mg via INTRAMUSCULAR

## 2012-11-21 NOTE — Assessment & Plan Note (Signed)
Re-start Vit D 

## 2012-11-21 NOTE — Assessment & Plan Note (Signed)
Continue with current prescription therapy as reflected on the Med list.  

## 2012-11-21 NOTE — Assessment & Plan Note (Signed)
Primidone caused sedation. Diazepam helped ok

## 2012-11-21 NOTE — Assessment & Plan Note (Signed)
Aleve prn Gabapentin prn Vit D Opioid intolerant

## 2012-11-21 NOTE — Assessment & Plan Note (Signed)
Chronic  Statin intolerant 
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
## 2012-11-21 NOTE — Assessment & Plan Note (Signed)
Diazepam prn 

## 2012-11-21 NOTE — Assessment & Plan Note (Signed)
Ceftin

## 2012-12-01 ENCOUNTER — Encounter: Payer: Self-pay | Admitting: Cardiology

## 2012-12-01 ENCOUNTER — Ambulatory Visit (INDEPENDENT_AMBULATORY_CARE_PROVIDER_SITE_OTHER): Payer: Medicare Other | Admitting: Cardiology

## 2012-12-01 VITALS — BP 121/71 | HR 84 | Ht 69.0 in | Wt 264.2 lb

## 2012-12-01 DIAGNOSIS — Z720 Tobacco use: Secondary | ICD-10-CM

## 2012-12-01 DIAGNOSIS — I2510 Atherosclerotic heart disease of native coronary artery without angina pectoris: Secondary | ICD-10-CM

## 2012-12-01 DIAGNOSIS — E785 Hyperlipidemia, unspecified: Secondary | ICD-10-CM

## 2012-12-01 DIAGNOSIS — F17200 Nicotine dependence, unspecified, uncomplicated: Secondary | ICD-10-CM

## 2012-12-01 DIAGNOSIS — I10 Essential (primary) hypertension: Secondary | ICD-10-CM

## 2012-12-01 NOTE — Assessment & Plan Note (Signed)
Continue diet. Intolerant to statins. 

## 2012-12-01 NOTE — Assessment & Plan Note (Signed)
BP controlled; continue present meds. 

## 2012-12-01 NOTE — Assessment & Plan Note (Signed)
Continue aspirin. Intolerant to statins. Schedule Myoview for risk stratification. 

## 2012-12-01 NOTE — Patient Instructions (Addendum)
Your physician wants you to follow-up in: ONE YEAR WITH DR CRENSHAW You will receive a reminder letter in the mail two months in advance. If you don't receive a letter, please call our office to schedule the follow-up appointment.   Your physician has requested that you have a lexiscan myoview. For further information please visit [email or web address]. Please follow instruction sheet, as given.   
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
## 2012-12-01 NOTE — Assessment & Plan Note (Signed)
Patient counseled on discontinuing. 

## 2012-12-13 ENCOUNTER — Ambulatory Visit (HOSPITAL_COMMUNITY): Payer: Medicare Other | Attending: Cardiovascular Disease | Admitting: Radiology

## 2012-12-13 VITALS — BP 118/66 | HR 63 | Ht 69.5 in | Wt 260.0 lb

## 2012-12-13 DIAGNOSIS — I739 Peripheral vascular disease, unspecified: Secondary | ICD-10-CM | POA: Insufficient documentation

## 2012-12-13 DIAGNOSIS — Z8249 Family history of ischemic heart disease and other diseases of the circulatory system: Secondary | ICD-10-CM | POA: Insufficient documentation

## 2012-12-13 DIAGNOSIS — I252 Old myocardial infarction: Secondary | ICD-10-CM | POA: Insufficient documentation

## 2012-12-13 DIAGNOSIS — I10 Essential (primary) hypertension: Secondary | ICD-10-CM | POA: Insufficient documentation

## 2012-12-13 DIAGNOSIS — I2510 Atherosclerotic heart disease of native coronary artery without angina pectoris: Secondary | ICD-10-CM

## 2012-12-13 DIAGNOSIS — I4510 Unspecified right bundle-branch block: Secondary | ICD-10-CM | POA: Insufficient documentation

## 2012-12-13 DIAGNOSIS — Z9861 Coronary angioplasty status: Secondary | ICD-10-CM | POA: Insufficient documentation

## 2012-12-13 DIAGNOSIS — R42 Dizziness and giddiness: Secondary | ICD-10-CM | POA: Insufficient documentation

## 2012-12-13 DIAGNOSIS — R0609 Other forms of dyspnea: Secondary | ICD-10-CM | POA: Insufficient documentation

## 2012-12-13 DIAGNOSIS — R55 Syncope and collapse: Secondary | ICD-10-CM | POA: Insufficient documentation

## 2012-12-13 DIAGNOSIS — Z951 Presence of aortocoronary bypass graft: Secondary | ICD-10-CM | POA: Insufficient documentation

## 2012-12-13 DIAGNOSIS — F17200 Nicotine dependence, unspecified, uncomplicated: Secondary | ICD-10-CM | POA: Insufficient documentation

## 2012-12-13 DIAGNOSIS — E785 Hyperlipidemia, unspecified: Secondary | ICD-10-CM | POA: Insufficient documentation

## 2012-12-13 DIAGNOSIS — R0989 Other specified symptoms and signs involving the circulatory and respiratory systems: Secondary | ICD-10-CM | POA: Insufficient documentation

## 2012-12-13 DIAGNOSIS — R0602 Shortness of breath: Secondary | ICD-10-CM

## 2012-12-13 MED ORDER — REGADENOSON 0.4 MG/5ML IV SOLN
0.4000 mg | Freq: Once | INTRAVENOUS | Status: AC
  Administered 2012-12-13: 0.4 mg via INTRAVENOUS

## 2012-12-13 MED ORDER — TECHNETIUM TC 99M SESTAMIBI GENERIC - CARDIOLITE
11.0000 | Freq: Once | INTRAVENOUS | Status: AC | PRN
  Administered 2012-12-13: 11 via INTRAVENOUS

## 2012-12-13 MED ORDER — TECHNETIUM TC 99M SESTAMIBI GENERIC - CARDIOLITE
33.0000 | Freq: Once | INTRAVENOUS | Status: AC | PRN
  Administered 2012-12-13: 33 via INTRAVENOUS

## 2012-12-13 NOTE — Progress Notes (Signed)
[facility] SITE 3 NUCLEAR MED [address] [place], [place] [address] [phone]    Cardiology Nuclear Med Study  Joe [name] is a 67 y.o. male     MRN : [id number]     DOB: [birth year]  Procedure Date: 12/13/2012  Nuclear Med Background Indication for Stress Test:  Evaluation for Ischemia and Graft Patency History:  '91 MI>PTCA; '03 CABG; '08 OZH:YQMVHQ, EF=78% Cardiac Risk Factors: Family History - CAD, Hypertension, Lipids, PVD, RBBB and Smoker  Symptoms:  Dizziness, DOE and Near Syncope   Nuclear Pre-Procedure Caffeine/Decaff Intake:  None > 12 hrs NPO After: 6:30pm   Lungs:  Clear. O2 Sat: 98% on room air. IV 0.9% NS with Angio Cath:  20g  IV Site: L Antecubital x 1, tolerated well IV Started by:  Irean Hong, RN  Chest Size (in):  52 Cup Size: n/a  Height: 5' 9.5" (1.765 m)  Weight:  260 lb (117.935 kg)  BMI:  Body mass index is 37.86 kg/(m^2). Tech Comments:  Administrator and Norvasc this am    Nuclear Med Study 1 or 2 day study: 1 day  Stress Test Type:  Lexiscan  Reading MD: Kristeen Miss, MD  Order Authorizing Provider:  Olga Millers, MD  Resting Radionuclide: Technetium 2m Sestamibi  Resting Radionuclide Dose: 11.0 mCi   Stress Radionuclide:  Technetium 38m Sestamibi  Stress Radionuclide Dose: 33.0 mCi           Stress Protocol Rest HR: 63 Stress HR: 105  Rest BP: 118/66 Stress BP: 158/55  Exercise Time (min): n/a METS: n/a   Predicted Max HR: 156 bpm % Max HR: 67.31 bpm Rate Pressure Product: 46962   Dose of Adenosine (mg):  n/a Dose of Lexiscan: 0.4 mg  Dose of Atropine (mg): n/a Dose of Dobutamine: n/a mcg/kg/min (at max HR)  Stress Test Technologist: Smiley Houseman, CMA-N  Nuclear Technologist:  Domenic Polite, CNMT     Rest Procedure:  Myocardial perfusion imaging was performed at rest 45 minutes following the intravenous administration of Technetium 55m Sestamibi.  Rest ECG: NSR - Normal EKG  Stress Procedure:  The  patient received IV Lexiscan 0.4 mg over 15-seconds.  Technetium 47m Sestamibi injected at 30-seconds.  He did c/o chest tightness, shortness of breath and nausea with lexiscan.  Quantitative spect images were obtained after a 45 minute delay.  Stress ECG: No significant change from baseline ECG  QPS Raw Data Images:  Normal; no motion artifact; normal heart/lung ratio. Stress Images:  Normal homogeneous uptake in all areas of the myocardium. Rest Images:  Normal homogeneous uptake in all areas of the myocardium. Subtraction (SDS):  No evidence of ischemia. Transient Ischemic Dilatation (Normal <1.22):  1.09 Lung/Heart Ratio (Normal <0.45):  0.40  Quantitative Gated Spect Images QGS EDV:  84 ml QGS ESV:  24 ml  Impression Exercise Capacity:  Lexiscan with no exercise. BP Response:  Normal blood pressure response. Clinical Symptoms:  No significant symptoms noted. ECG Impression:  No significant ST segment change suggestive of ischemia. Comparison with Prior Nuclear Study: No images to compare  Overall Impression:  Normal stress nuclear study.  No ischemia.  Normal LV function.  LV Ejection Fraction: 71%.  LV Wall Motion:  NL LV Function; NL Wall Motion.   Vesta Mixer, Montez Hageman., MD, [facility] 12/13/2012, 6:14 PM Office - [phone] Pager [phone]

## 2013-02-13 ENCOUNTER — Encounter: Payer: Self-pay | Admitting: Internal Medicine

## 2013-03-22 ENCOUNTER — Other Ambulatory Visit (INDEPENDENT_AMBULATORY_CARE_PROVIDER_SITE_OTHER): Payer: Medicare Other

## 2013-03-22 ENCOUNTER — Ambulatory Visit (INDEPENDENT_AMBULATORY_CARE_PROVIDER_SITE_OTHER): Payer: Medicare Other | Admitting: Internal Medicine

## 2013-03-22 ENCOUNTER — Encounter: Payer: Self-pay | Admitting: Internal Medicine

## 2013-03-22 VITALS — BP 130/66 | HR 72 | Temp 98.7°F | Resp 16 | Wt 268.0 lb

## 2013-03-22 DIAGNOSIS — I10 Essential (primary) hypertension: Secondary | ICD-10-CM

## 2013-03-22 DIAGNOSIS — J329 Chronic sinusitis, unspecified: Secondary | ICD-10-CM

## 2013-03-22 DIAGNOSIS — E291 Testicular hypofunction: Secondary | ICD-10-CM

## 2013-03-22 DIAGNOSIS — R739 Hyperglycemia, unspecified: Secondary | ICD-10-CM

## 2013-03-22 DIAGNOSIS — I2510 Atherosclerotic heart disease of native coronary artery without angina pectoris: Secondary | ICD-10-CM

## 2013-03-22 DIAGNOSIS — G252 Other specified forms of tremor: Secondary | ICD-10-CM

## 2013-03-22 DIAGNOSIS — R7309 Other abnormal glucose: Secondary | ICD-10-CM

## 2013-03-22 DIAGNOSIS — M542 Cervicalgia: Secondary | ICD-10-CM

## 2013-03-22 DIAGNOSIS — M2550 Pain in unspecified joint: Secondary | ICD-10-CM

## 2013-03-22 DIAGNOSIS — G250 Essential tremor: Secondary | ICD-10-CM

## 2013-03-22 DIAGNOSIS — E785 Hyperlipidemia, unspecified: Secondary | ICD-10-CM

## 2013-03-22 DIAGNOSIS — E559 Vitamin D deficiency, unspecified: Secondary | ICD-10-CM

## 2013-03-22 DIAGNOSIS — F411 Generalized anxiety disorder: Secondary | ICD-10-CM

## 2013-03-22 DIAGNOSIS — E538 Deficiency of other specified B group vitamins: Secondary | ICD-10-CM

## 2013-03-22 LAB — BASIC METABOLIC PANEL
CO2: 25 mEq/L (ref 19–32)
Calcium: 9.3 mg/dL (ref 8.4–10.5)
GFR: 74.59 mL/min (ref >60.00)
Sodium: 140 mEq/L (ref 135–145)

## 2013-03-22 LAB — HEPATIC FUNCTION PANEL
ALT: 33 U/L (ref 0–53)
AST: 29 U/L (ref 0–37)
Albumin: 4 g/dL (ref 3.5–5.2)
Alkaline Phosphatase: 55 U/L (ref 39–117)
Total Protein: 7.3 g/dL (ref 6.0–8.3)

## 2013-03-22 LAB — LIPID PANEL
Cholesterol: 191 mg/dL (ref 0–200)
LDL Cholesterol: 139 mg/dL — ABNORMAL HIGH (ref 0–99)
VLDL: 19 mg/dL (ref 0.0–40.0)

## 2013-03-22 LAB — VITAMIN B12: Vitamin B-12: 363 pg/mL (ref 211–911)

## 2013-03-22 LAB — HEMOGLOBIN A1C: Hgb A1c MFr Bld: 6 % (ref 4.6–6.5)

## 2013-03-22 MED ORDER — CYCLOBENZAPRINE HCL 5 MG PO TABS: 5.0000 mg | ORAL_TABLET | Freq: Every day | ORAL | Status: DC

## 2013-03-22 NOTE — Assessment & Plan Note (Signed)
Chronic. He has sweats. Not using testosterone. He wants to restart DHEA

## 2013-03-22 NOTE — Assessment & Plan Note (Signed)
Continue with current prescription therapy as reflected on the Med list.  
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
## 2013-03-22 NOTE — Assessment & Plan Note (Signed)
7/14 worse

## 2013-03-22 NOTE — Assessment & Plan Note (Addendum)
Worse - labs ordered  Gluten free trial (no wheat products) x4-6 weeks. OK to use Gluten-free bread and pasta. Milk free trial (no milk, ice cream and yogurt) x4 weeks. OK to use almond or soy milk.

## 2013-03-22 NOTE — Patient Instructions (Signed)
Gluten free trial (no wheat products) x4-6 weeks. OK to use Gluten-free bread and pasta. Milk free trial (no milk, ice cream and yogurt) x4 weeks. OK to use almond or soy milk. 

## 2013-03-22 NOTE — Assessment & Plan Note (Signed)
Flexeril at hs 

## 2013-03-23 LAB — NMR LIPOPROFILE WITHOUT LIPIDS
HDL Particle Number: 24.9 umol/L — ABNORMAL LOW (ref >=30.5)
HDL Size: 8.2 nm — ABNORMAL LOW (ref >=9.2)
LDL Size: 20.9 nm (ref >20.5)
LP-IR Score: 74 — ABNORMAL HIGH (ref <=45)

## 2013-05-10 ENCOUNTER — Other Ambulatory Visit: Payer: Self-pay | Admitting: Internal Medicine

## 2013-05-19 ENCOUNTER — Other Ambulatory Visit: Payer: Self-pay | Admitting: Cardiology

## 2013-06-07 ENCOUNTER — Other Ambulatory Visit: Payer: Self-pay | Admitting: Cardiology

## 2013-06-23 ENCOUNTER — Encounter: Payer: Self-pay | Admitting: Internal Medicine

## 2013-06-23 ENCOUNTER — Ambulatory Visit (INDEPENDENT_AMBULATORY_CARE_PROVIDER_SITE_OTHER): Payer: Medicare Other | Admitting: Internal Medicine

## 2013-06-23 VITALS — BP 138/60 | HR 80 | Temp 98.4°F | Resp 16 | Wt 276.0 lb

## 2013-06-23 DIAGNOSIS — F411 Generalized anxiety disorder: Secondary | ICD-10-CM

## 2013-06-23 DIAGNOSIS — Z23 Encounter for immunization: Secondary | ICD-10-CM

## 2013-06-23 DIAGNOSIS — E538 Deficiency of other specified B group vitamins: Secondary | ICD-10-CM

## 2013-06-23 DIAGNOSIS — I10 Essential (primary) hypertension: Secondary | ICD-10-CM

## 2013-06-23 DIAGNOSIS — I2510 Atherosclerotic heart disease of native coronary artery without angina pectoris: Secondary | ICD-10-CM

## 2013-06-23 DIAGNOSIS — M2550 Pain in unspecified joint: Secondary | ICD-10-CM

## 2013-06-23 DIAGNOSIS — E559 Vitamin D deficiency, unspecified: Secondary | ICD-10-CM

## 2013-06-23 MED ORDER — AMOXICILLIN 500 MG PO CAPS: 1000.0000 mg | ORAL_CAPSULE | Freq: Two times a day (BID) | ORAL | Status: DC

## 2013-06-23 NOTE — Assessment & Plan Note (Signed)
Amoxicillin
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
## 2013-06-23 NOTE — Assessment & Plan Note (Signed)
Continue with current prescription therapy as reflected on the Med list.  

## 2013-06-23 NOTE — Patient Instructions (Signed)
Please loose weight    Milk free trial (no milk, ice cream, cheese and yogurt) for 4-6 weeks. OK to use almond, coconut, rice or soy milk. "Almond breeze" brand tastes good.

## 2013-06-23 NOTE — Assessment & Plan Note (Signed)
Continue with current prn prescription therapy as reflected on the Med list.  

## 2013-09-20 ENCOUNTER — Encounter: Payer: Self-pay | Admitting: Internal Medicine

## 2013-09-25 ENCOUNTER — Encounter: Payer: Self-pay | Admitting: Internal Medicine

## 2013-09-25 ENCOUNTER — Ambulatory Visit (INDEPENDENT_AMBULATORY_CARE_PROVIDER_SITE_OTHER): Payer: Medicare Other | Admitting: Internal Medicine

## 2013-09-25 ENCOUNTER — Other Ambulatory Visit (INDEPENDENT_AMBULATORY_CARE_PROVIDER_SITE_OTHER): Payer: Medicare Other

## 2013-09-25 VITALS — BP 124/80 | HR 76 | Temp 98.2°F | Resp 16 | Wt 280.0 lb

## 2013-09-25 DIAGNOSIS — E559 Vitamin D deficiency, unspecified: Secondary | ICD-10-CM

## 2013-09-25 DIAGNOSIS — H547 Unspecified visual loss: Secondary | ICD-10-CM

## 2013-09-25 DIAGNOSIS — K5909 Other constipation: Secondary | ICD-10-CM

## 2013-09-25 DIAGNOSIS — H538 Other visual disturbances: Secondary | ICD-10-CM

## 2013-09-25 DIAGNOSIS — I2510 Atherosclerotic heart disease of native coronary artery without angina pectoris: Secondary | ICD-10-CM

## 2013-09-25 DIAGNOSIS — J019 Acute sinusitis, unspecified: Secondary | ICD-10-CM

## 2013-09-25 DIAGNOSIS — E538 Deficiency of other specified B group vitamins: Secondary | ICD-10-CM

## 2013-09-25 DIAGNOSIS — I10 Essential (primary) hypertension: Secondary | ICD-10-CM

## 2013-09-25 DIAGNOSIS — M2550 Pain in unspecified joint: Secondary | ICD-10-CM

## 2013-09-25 DIAGNOSIS — J309 Allergic rhinitis, unspecified: Secondary | ICD-10-CM

## 2013-09-25 LAB — BASIC METABOLIC PANEL
BUN: 8 mg/dL (ref 6–23)
CHLORIDE: 104 meq/L (ref 96–112)
CO2: 24 meq/L (ref 19–32)
Calcium: 9.3 mg/dL (ref 8.4–10.5)
Creatinine, Ser: 1.1 mg/dL (ref 0.4–1.5)
GFR: 74.47 mL/min (ref >60.00)
GLUCOSE: 89 mg/dL (ref 70–99)
POTASSIUM: 4 meq/L (ref 3.5–5.1)
SODIUM: 137 meq/L (ref 135–145)

## 2013-09-25 MED ORDER — AZITHROMYCIN 250 MG PO TABS: ORAL_TABLET | ORAL | Status: DC

## 2013-09-25 MED ORDER — LINACLOTIDE 145 MCG PO CAPS: 145.0000 ug | ORAL_CAPSULE | ORAL | Status: DC

## 2013-09-25 NOTE — Assessment & Plan Note (Signed)
Continue with current prescription therapy as reflected on the Med list.  

## 2013-09-25 NOTE — Assessment & Plan Note (Signed)
Recurrent daily- he relates it to his meds

## 2013-09-25 NOTE — Progress Notes (Signed)
Pre visit review using our clinic review tool, if applicable. No additional management support is needed unless otherwise documented below in the visit note. 
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
## 2013-09-25 NOTE — Assessment & Plan Note (Signed)
Zpac 

## 2013-09-25 NOTE — Assessment & Plan Note (Signed)
Ophth ref dviced

## 2013-09-25 NOTE — Assessment & Plan Note (Signed)
linzess prn  

## 2013-11-07 ENCOUNTER — Telehealth: Payer: Self-pay | Admitting: Internal Medicine

## 2013-11-07 NOTE — Telephone Encounter (Signed)
3.16.15  Pt is in lobby requesting Benicar 20mg  samples.  thanks

## 2013-11-25 ENCOUNTER — Other Ambulatory Visit: Payer: Self-pay | Admitting: Internal Medicine

## 2013-12-28 ENCOUNTER — Encounter: Payer: Self-pay | Admitting: Cardiology

## 2013-12-28 ENCOUNTER — Ambulatory Visit (INDEPENDENT_AMBULATORY_CARE_PROVIDER_SITE_OTHER): Payer: Medicare Other | Admitting: Cardiology

## 2013-12-28 VITALS — BP 122/72 | HR 73 | Ht 69.5 in | Wt 275.0 lb

## 2013-12-28 DIAGNOSIS — F17200 Nicotine dependence, unspecified, uncomplicated: Secondary | ICD-10-CM

## 2013-12-28 DIAGNOSIS — Z720 Tobacco use: Secondary | ICD-10-CM

## 2013-12-28 DIAGNOSIS — I2510 Atherosclerotic heart disease of native coronary artery without angina pectoris: Secondary | ICD-10-CM

## 2013-12-28 DIAGNOSIS — E785 Hyperlipidemia, unspecified: Secondary | ICD-10-CM

## 2013-12-28 DIAGNOSIS — I10 Essential (primary) hypertension: Secondary | ICD-10-CM

## 2013-12-28 NOTE — Patient Instructions (Signed)
Your physician wants you to follow-up in: ONE YEAR WITH DR CRENSHAW You will receive a reminder letter in the mail two months in advance. If you don't receive a letter, please call our office to schedule the follow-up appointment.  

## 2013-12-28 NOTE — Assessment & Plan Note (Signed)
Continue aspirin. Last nuclear study low risk.

## 2013-12-28 NOTE — Assessment & Plan Note (Signed)
Continue diet. Intolerant to statins. 
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
## 2013-12-28 NOTE — Assessment & Plan Note (Signed)
Blood pressure controlled. Continue present medications. 

## 2013-12-28 NOTE — Assessment & Plan Note (Signed)
Patient counseled on discontinuing. 

## 2014-01-01 ENCOUNTER — Ambulatory Visit (INDEPENDENT_AMBULATORY_CARE_PROVIDER_SITE_OTHER): Payer: Medicare Other | Admitting: Internal Medicine

## 2014-01-01 ENCOUNTER — Encounter: Payer: Self-pay | Admitting: Internal Medicine

## 2014-01-01 ENCOUNTER — Other Ambulatory Visit (INDEPENDENT_AMBULATORY_CARE_PROVIDER_SITE_OTHER): Payer: Medicare Other

## 2014-01-01 ENCOUNTER — Ambulatory Visit (INDEPENDENT_AMBULATORY_CARE_PROVIDER_SITE_OTHER)
Admission: RE | Admit: 2014-01-01 | Discharge: 2014-01-01 | Disposition: A | Discharge status: Home / Self Care | Payer: Medicare Other | Source: Ambulatory Visit | Attending: Internal Medicine | Admitting: Internal Medicine

## 2014-01-01 VITALS — BP 120/62 | HR 76 | Temp 99.4°F | Resp 16 | Wt 276.0 lb

## 2014-01-01 DIAGNOSIS — E291 Testicular hypofunction: Secondary | ICD-10-CM

## 2014-01-01 DIAGNOSIS — M79604 Pain in right leg: Secondary | ICD-10-CM

## 2014-01-01 DIAGNOSIS — M79609 Pain in unspecified limb: Secondary | ICD-10-CM

## 2014-01-01 DIAGNOSIS — M79605 Pain in left leg: Secondary | ICD-10-CM

## 2014-01-01 DIAGNOSIS — I2510 Atherosclerotic heart disease of native coronary artery without angina pectoris: Secondary | ICD-10-CM

## 2014-01-01 DIAGNOSIS — R7309 Other abnormal glucose: Secondary | ICD-10-CM

## 2014-01-01 DIAGNOSIS — E538 Deficiency of other specified B group vitamins: Secondary | ICD-10-CM

## 2014-01-01 DIAGNOSIS — I10 Essential (primary) hypertension: Secondary | ICD-10-CM

## 2014-01-01 DIAGNOSIS — R739 Hyperglycemia, unspecified: Secondary | ICD-10-CM

## 2014-01-01 DIAGNOSIS — E559 Vitamin D deficiency, unspecified: Secondary | ICD-10-CM

## 2014-01-01 DIAGNOSIS — R109 Unspecified abdominal pain: Secondary | ICD-10-CM

## 2014-01-01 DIAGNOSIS — R1032 Left lower quadrant pain: Secondary | ICD-10-CM

## 2014-01-01 LAB — BASIC METABOLIC PANEL
BUN: 12 mg/dL (ref 6–23)
CO2: 27 mEq/L (ref 19–32)
Calcium: 9.4 mg/dL (ref 8.4–10.5)
Chloride: 104 mEq/L (ref 96–112)
Creatinine, Ser: 1.1 mg/dL (ref 0.4–1.5)
GFR: 70.56 mL/min (ref >60.00)
Glucose, Bld: 101 mg/dL — ABNORMAL HIGH (ref 70–99)
POTASSIUM: 4.2 meq/L (ref 3.5–5.1)
SODIUM: 137 meq/L (ref 135–145)

## 2014-01-01 LAB — SEDIMENTATION RATE: SED RATE: 17 mm/h (ref 0–22)

## 2014-01-01 LAB — HEPATIC FUNCTION PANEL
ALBUMIN: 4 g/dL (ref 3.5–5.2)
ALT: 30 U/L (ref 0–53)
AST: 26 U/L (ref 0–37)
Alkaline Phosphatase: 54 U/L (ref 39–117)
Bilirubin, Direct: 0.1 mg/dL (ref 0.0–0.3)
TOTAL PROTEIN: 7.3 g/dL (ref 6.0–8.3)
Total Bilirubin: 0.9 mg/dL (ref 0.2–1.2)

## 2014-01-01 LAB — LIPID PANEL
CHOLESTEROL: 186 mg/dL (ref 0–200)
HDL: 32.5 mg/dL — AB (ref >39.00)
LDL Cholesterol: 118 mg/dL — ABNORMAL HIGH (ref 0–99)
Total CHOL/HDL Ratio: 6
Triglycerides: 178 mg/dL — ABNORMAL HIGH (ref 0.0–149.0)
VLDL: 35.6 mg/dL (ref 0.0–40.0)

## 2014-01-01 LAB — TSH: TSH: 0.6 u[IU]/mL (ref 0.35–4.50)

## 2014-01-01 LAB — VITAMIN B12: Vitamin B-12: 307 pg/mL (ref 211–911)

## 2014-01-01 LAB — HEMOGLOBIN A1C: Hgb A1c MFr Bld: 6.1 % (ref 4.6–6.5)

## 2014-01-01 NOTE — Assessment & Plan Note (Signed)
Continue with current prescription therapy as reflected on the Med list.  

## 2014-01-01 NOTE — Assessment & Plan Note (Signed)
5/15 poss hip OA L>R [facility]

## 2014-01-01 NOTE — Progress Notes (Signed)
Pre visit review using our clinic review tool, if applicable. No additional management support is needed unless otherwise documented below in the visit note. 

## 2014-01-01 NOTE — Assessment & Plan Note (Signed)
Loose wt, better diet

## 2014-01-01 NOTE — Assessment & Plan Note (Signed)
5/15 knees, hips

## 2014-03-08 ENCOUNTER — Other Ambulatory Visit: Payer: Self-pay | Admitting: Internal Medicine

## 2014-03-28 ENCOUNTER — Other Ambulatory Visit: Payer: Self-pay

## 2014-03-28 MED ORDER — OLMESARTAN MEDOXOMIL 20 MG PO TABS: ORAL_TABLET | ORAL | Status: DC

## 2014-03-28 MED ORDER — AMLODIPINE BESYLATE 5 MG PO TABS: ORAL_TABLET | ORAL | Status: DC

## 2014-04-09 ENCOUNTER — Ambulatory Visit: Payer: Medicare Other | Admitting: Internal Medicine

## 2014-04-16 ENCOUNTER — Ambulatory Visit (INDEPENDENT_AMBULATORY_CARE_PROVIDER_SITE_OTHER): Payer: Medicare Other | Admitting: Internal Medicine

## 2014-04-16 ENCOUNTER — Encounter: Payer: Self-pay | Admitting: Internal Medicine

## 2014-04-16 ENCOUNTER — Other Ambulatory Visit (INDEPENDENT_AMBULATORY_CARE_PROVIDER_SITE_OTHER): Payer: Medicare Other

## 2014-04-16 VITALS — BP 140/64 | HR 68 | Temp 98.3°F | Resp 16 | Ht 68.5 in | Wt 272.0 lb

## 2014-04-16 DIAGNOSIS — Z23 Encounter for immunization: Secondary | ICD-10-CM

## 2014-04-16 DIAGNOSIS — I10 Essential (primary) hypertension: Secondary | ICD-10-CM

## 2014-04-16 DIAGNOSIS — G252 Other specified forms of tremor: Secondary | ICD-10-CM

## 2014-04-16 DIAGNOSIS — G250 Essential tremor: Secondary | ICD-10-CM

## 2014-04-16 DIAGNOSIS — M79604 Pain in right leg: Secondary | ICD-10-CM

## 2014-04-16 DIAGNOSIS — N320 Bladder-neck obstruction: Secondary | ICD-10-CM

## 2014-04-16 DIAGNOSIS — Z0000 Encounter for general adult medical examination without abnormal findings: Secondary | ICD-10-CM

## 2014-04-16 DIAGNOSIS — E538 Deficiency of other specified B group vitamins: Secondary | ICD-10-CM

## 2014-04-16 DIAGNOSIS — E119 Type 2 diabetes mellitus without complications: Secondary | ICD-10-CM

## 2014-04-16 DIAGNOSIS — F411 Generalized anxiety disorder: Secondary | ICD-10-CM

## 2014-04-16 DIAGNOSIS — I2510 Atherosclerotic heart disease of native coronary artery without angina pectoris: Secondary | ICD-10-CM

## 2014-04-16 DIAGNOSIS — M2550 Pain in unspecified joint: Secondary | ICD-10-CM

## 2014-04-16 DIAGNOSIS — J0100 Acute maxillary sinusitis, unspecified: Secondary | ICD-10-CM

## 2014-04-16 DIAGNOSIS — R42 Dizziness and giddiness: Secondary | ICD-10-CM

## 2014-04-16 DIAGNOSIS — M79605 Pain in left leg: Principal | ICD-10-CM

## 2014-04-16 DIAGNOSIS — J0101 Acute recurrent maxillary sinusitis: Secondary | ICD-10-CM

## 2014-04-16 LAB — BASIC METABOLIC PANEL
BUN: 17 mg/dL (ref 6–23)
CALCIUM: 9.7 mg/dL (ref 8.4–10.5)
CO2: 25 mEq/L (ref 19–32)
CREATININE: 1.2 mg/dL (ref 0.4–1.5)
Chloride: 102 mEq/L (ref 96–112)
GFR: 65.05 mL/min (ref >60.00)
Glucose, Bld: 91 mg/dL (ref 70–99)
Potassium: 4.1 mEq/L (ref 3.5–5.1)
Sodium: 137 mEq/L (ref 135–145)

## 2014-04-16 LAB — CBC WITH DIFFERENTIAL/PLATELET
BASOS ABS: 0.1 10*3/uL (ref 0.0–0.1)
Basophils Relative: 0.9 % (ref 0.0–3.0)
EOS ABS: 0.2 10*3/uL (ref 0.0–0.7)
Eosinophils Relative: 1.5 % (ref 0.0–5.0)
HEMATOCRIT: 48.8 % (ref 39.0–52.0)
HEMOGLOBIN: 16.7 g/dL (ref 13.0–17.0)
LYMPHS ABS: 2.7 10*3/uL (ref 0.7–4.0)
Lymphocytes Relative: 25.9 % (ref 12.0–46.0)
MCHC: 34.3 g/dL (ref 30.0–36.0)
MCV: 93.8 fl (ref 78.0–100.0)
Monocytes Absolute: 0.8 10*3/uL (ref 0.1–1.0)
Monocytes Relative: 7.9 % (ref 3.0–12.0)
NEUTROS ABS: 6.5 10*3/uL (ref 1.4–7.7)
Neutrophils Relative %: 63.8 % (ref 43.0–77.0)
Platelets: 281 10*3/uL (ref 150.0–400.0)
RBC: 5.2 Mil/uL (ref 4.22–5.81)
RDW: 13.6 % (ref 11.5–15.5)
WBC: 10.3 10*3/uL (ref 4.0–10.5)

## 2014-04-16 LAB — LIPID PANEL
CHOL/HDL RATIO: 6
Cholesterol: 198 mg/dL (ref 0–200)
HDL: 31.6 mg/dL — ABNORMAL LOW (ref >39.00)
LDL Cholesterol: 141 mg/dL — ABNORMAL HIGH (ref 0–99)
NONHDL: 166.4
TRIGLYCERIDES: 128 mg/dL (ref 0.0–149.0)
VLDL: 25.6 mg/dL (ref 0.0–40.0)

## 2014-04-16 LAB — HEPATIC FUNCTION PANEL
ALT: 29 U/L (ref 0–53)
AST: 26 U/L (ref 0–37)
Albumin: 4.1 g/dL (ref 3.5–5.2)
Alkaline Phosphatase: 58 U/L (ref 39–117)
BILIRUBIN DIRECT: 0.2 mg/dL (ref 0.0–0.3)
TOTAL PROTEIN: 7.5 g/dL (ref 6.0–8.3)
Total Bilirubin: 1.6 mg/dL — ABNORMAL HIGH (ref 0.2–1.2)

## 2014-04-16 LAB — HEMOGLOBIN A1C: Hgb A1c MFr Bld: 6 % (ref 4.6–6.5)

## 2014-04-16 LAB — URINALYSIS
HGB URINE DIPSTICK: NEGATIVE
Ketones, ur: 15 — AB
LEUKOCYTES UA: NEGATIVE
Nitrite: NEGATIVE
PH: 5.5 (ref 5.0–8.0)
Specific Gravity, Urine: 1.015 (ref 1.000–1.030)
Total Protein, Urine: NEGATIVE
Urine Glucose: NEGATIVE
Urobilinogen, UA: 0.2 (ref 0.0–1.0)

## 2014-04-16 LAB — TSH: TSH: 0.89 u[IU]/mL (ref 0.35–4.50)

## 2014-04-16 LAB — RHEUMATOID FACTOR

## 2014-04-16 LAB — PSA: PSA: 2.88 ng/mL (ref 0.10–4.00)

## 2014-04-16 MED ORDER — MECLIZINE HCL 25 MG PO TABS: 25.0000 mg | ORAL_TABLET | Freq: Three times a day (TID) | ORAL | Status: DC | PRN

## 2014-04-16 MED ORDER — AZITHROMYCIN 250 MG PO TABS: ORAL_TABLET | ORAL | Status: DC

## 2014-04-16 NOTE — Assessment & Plan Note (Signed)
Recurrent BPV. Meclizine prn 

## 2014-04-16 NOTE — Assessment & Plan Note (Signed)
Continue with current prescription therapy as reflected on the Med list.  
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
## 2014-04-16 NOTE — Progress Notes (Deleted)
Pre visit review using our clinic review tool, if applicable. No additional management support is needed unless otherwise documented below in the visit note. 

## 2014-04-16 NOTE — Assessment & Plan Note (Signed)

## 2014-04-16 NOTE — Patient Instructions (Signed)

## 2014-04-16 NOTE — Assessment & Plan Note (Signed)
Continue with current prescription prn therapy as reflected on the Med list.  

## 2014-04-16 NOTE — Assessment & Plan Note (Signed)
PO Abx 

## 2014-05-02 ENCOUNTER — Telehealth: Payer: Self-pay | Admitting: *Deleted

## 2014-05-02 NOTE — Telephone Encounter (Signed)
Pt states he had labs done 2-3 weeks ago requesting results...Joe [name]

## 2014-05-02 NOTE — Telephone Encounter (Signed)
Labs were good Thx

## 2014-05-03 NOTE — Telephone Encounter (Signed)
Notified pt with md response.../lmb 

## 2014-07-17 ENCOUNTER — Ambulatory Visit (INDEPENDENT_AMBULATORY_CARE_PROVIDER_SITE_OTHER): Payer: Medicare Other | Admitting: Internal Medicine

## 2014-07-17 ENCOUNTER — Encounter: Payer: Self-pay | Admitting: Internal Medicine

## 2014-07-17 VITALS — BP 130/74 | HR 87 | Temp 98.2°F | Wt 281.0 lb

## 2014-07-17 DIAGNOSIS — I10 Essential (primary) hypertension: Secondary | ICD-10-CM

## 2014-07-17 DIAGNOSIS — M25532 Pain in left wrist: Secondary | ICD-10-CM | POA: Insufficient documentation

## 2014-07-17 DIAGNOSIS — J309 Allergic rhinitis, unspecified: Secondary | ICD-10-CM

## 2014-07-17 DIAGNOSIS — I2510 Atherosclerotic heart disease of native coronary artery without angina pectoris: Secondary | ICD-10-CM

## 2014-07-17 DIAGNOSIS — M2550 Pain in unspecified joint: Secondary | ICD-10-CM

## 2014-07-17 DIAGNOSIS — Z720 Tobacco use: Secondary | ICD-10-CM
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
## 2014-07-17 NOTE — Assessment & Plan Note (Signed)
Continue with current prescription therapy as reflected on the Med list.  
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
## 2014-07-17 NOTE — Assessment & Plan Note (Signed)
Continue with current prn prescription therapy as reflected on the Med list. Aleve prn

## 2014-07-17 NOTE — Assessment & Plan Note (Signed)
1/2 ppd Discussed  

## 2014-07-17 NOTE — Progress Notes (Signed)
Pre visit review using our clinic review tool, if applicable. No additional management support is needed unless otherwise documented below in the visit note. 

## 2014-07-18 ENCOUNTER — Telehealth: Payer: Self-pay | Admitting: Internal Medicine

## 2014-07-18 NOTE — Telephone Encounter (Signed)
emmi emailed °

## 2014-10-16 ENCOUNTER — Ambulatory Visit (INDEPENDENT_AMBULATORY_CARE_PROVIDER_SITE_OTHER): Payer: Medicare Other | Admitting: Internal Medicine

## 2014-10-16 ENCOUNTER — Encounter: Payer: Self-pay | Admitting: Internal Medicine

## 2014-10-16 ENCOUNTER — Other Ambulatory Visit (INDEPENDENT_AMBULATORY_CARE_PROVIDER_SITE_OTHER): Payer: Medicare Other

## 2014-10-16 VITALS — BP 148/64 | HR 88 | Temp 98.8°F | Wt 279.0 lb

## 2014-10-16 DIAGNOSIS — I10 Essential (primary) hypertension: Secondary | ICD-10-CM

## 2014-10-16 DIAGNOSIS — F411 Generalized anxiety disorder: Secondary | ICD-10-CM

## 2014-10-16 DIAGNOSIS — E538 Deficiency of other specified B group vitamins: Secondary | ICD-10-CM | POA: Diagnosis not present

## 2014-10-16 DIAGNOSIS — M79604 Pain in right leg: Secondary | ICD-10-CM | POA: Diagnosis not present

## 2014-10-16 DIAGNOSIS — I2510 Atherosclerotic heart disease of native coronary artery without angina pectoris: Secondary | ICD-10-CM | POA: Diagnosis not present

## 2014-10-16 DIAGNOSIS — M79605 Pain in left leg: Secondary | ICD-10-CM

## 2014-10-16 LAB — BASIC METABOLIC PANEL
BUN: 13 mg/dL (ref 6–23)
CALCIUM: 9.4 mg/dL (ref 8.4–10.5)
CHLORIDE: 102 meq/L (ref 96–112)
CO2: 27 meq/L (ref 19–32)
CREATININE: 1.08 mg/dL (ref 0.40–1.50)
GFR: 72.64 mL/min (ref >60.00)
Glucose, Bld: 114 mg/dL — ABNORMAL HIGH (ref 70–99)
Potassium: 4.4 mEq/L (ref 3.5–5.1)
SODIUM: 136 meq/L (ref 135–145)
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
## 2014-10-16 NOTE — Assessment & Plan Note (Signed)
Continue with current prescription therapy as reflected on the Med list.  

## 2014-10-16 NOTE — Patient Instructions (Signed)
[email or web address]: IT band stretch

## 2014-10-16 NOTE — Assessment & Plan Note (Signed)
Stretch IT bands Use Gabapentin prn

## 2014-10-16 NOTE — Progress Notes (Signed)
Pre visit review using our clinic review tool, if applicable. No additional management support is needed unless otherwise documented below in the visit note. 

## 2015-01-15 ENCOUNTER — Ambulatory Visit (INDEPENDENT_AMBULATORY_CARE_PROVIDER_SITE_OTHER): Payer: Medicare Other | Admitting: Internal Medicine

## 2015-01-15 ENCOUNTER — Encounter: Payer: Self-pay | Admitting: Internal Medicine

## 2015-01-15 DIAGNOSIS — M2550 Pain in unspecified joint: Secondary | ICD-10-CM

## 2015-01-15 DIAGNOSIS — E785 Hyperlipidemia, unspecified: Secondary | ICD-10-CM | POA: Diagnosis not present

## 2015-01-15 MED ORDER — OLMESARTAN MEDOXOMIL 20 MG PO TABS: ORAL_TABLET | ORAL | Status: DC

## 2015-01-15 MED ORDER — GABAPENTIN 100 MG PO CAPS: 100.0000 mg | ORAL_CAPSULE | Freq: Three times a day (TID) | ORAL | Status: DC

## 2015-01-15 MED ORDER — DIAZEPAM 2 MG PO TABS: 2.0000 mg | ORAL_TABLET | Freq: Three times a day (TID) | ORAL | Status: DC | PRN

## 2015-01-15 MED ORDER — FUROSEMIDE 20 MG PO TABS: 20.0000 mg | ORAL_TABLET | Freq: Every day | ORAL | Status: DC | PRN

## 2015-01-15 MED ORDER — PHENTERMINE HCL 37.5 MG PO TABS: 37.5000 mg | ORAL_TABLET | Freq: Every day | ORAL | Status: DC
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
## 2015-01-15 NOTE — Assessment & Plan Note (Signed)
On low fat diet

## 2015-01-15 NOTE — Progress Notes (Signed)
Pre visit review using our clinic review tool, if applicable. No additional management support is needed unless otherwise documented below in the visit note. 

## 2015-01-15 NOTE — Assessment & Plan Note (Signed)
Discussed low carb diet, surgery, wt watchers

## 2015-01-15 NOTE — Assessment & Plan Note (Signed)
Chronic. 

## 2015-03-22 ENCOUNTER — Other Ambulatory Visit (INDEPENDENT_AMBULATORY_CARE_PROVIDER_SITE_OTHER): Payer: Medicare Other

## 2015-03-22 ENCOUNTER — Encounter: Payer: Self-pay | Admitting: Internal Medicine

## 2015-03-22 ENCOUNTER — Ambulatory Visit (INDEPENDENT_AMBULATORY_CARE_PROVIDER_SITE_OTHER): Payer: Medicare Other | Admitting: Internal Medicine

## 2015-03-22 VITALS — BP 150/84 | HR 85 | Wt 284.0 lb

## 2015-03-22 DIAGNOSIS — E559 Vitamin D deficiency, unspecified: Secondary | ICD-10-CM

## 2015-03-22 DIAGNOSIS — I2510 Atherosclerotic heart disease of native coronary artery without angina pectoris: Secondary | ICD-10-CM

## 2015-03-22 DIAGNOSIS — J0100 Acute maxillary sinusitis, unspecified: Secondary | ICD-10-CM

## 2015-03-22 DIAGNOSIS — E538 Deficiency of other specified B group vitamins: Secondary | ICD-10-CM

## 2015-03-22 LAB — HEPATIC FUNCTION PANEL
ALT: 34 U/L (ref 0–53)
AST: 22 U/L (ref 0–37)
Albumin: 4.2 g/dL (ref 3.5–5.2)
Alkaline Phosphatase: 58 U/L (ref 39–117)
BILIRUBIN TOTAL: 0.9 mg/dL (ref 0.2–1.2)
Bilirubin, Direct: 0.1 mg/dL (ref 0.0–0.3)
TOTAL PROTEIN: 7.6 g/dL (ref 6.0–8.3)

## 2015-03-22 LAB — BASIC METABOLIC PANEL
BUN: 11 mg/dL (ref 6–23)
CALCIUM: 9.6 mg/dL (ref 8.4–10.5)
CHLORIDE: 101 meq/L (ref 96–112)
CO2: 27 mEq/L (ref 19–32)
Creatinine, Ser: 1.17 mg/dL (ref 0.40–1.50)
GFR: 66.15 mL/min (ref >60.00)
Glucose, Bld: 90 mg/dL (ref 70–99)
POTASSIUM: 4 meq/L (ref 3.5–5.1)
Sodium: 137 mEq/L (ref 135–145)

## 2015-03-22 MED ORDER — CEFUROXIME AXETIL 500 MG PO TABS: 500.0000 mg | ORAL_TABLET | Freq: Two times a day (BID) | ORAL | Status: DC

## 2015-03-22 NOTE — Assessment & Plan Note (Signed)
On B12 

## 2015-03-22 NOTE — Assessment & Plan Note (Signed)
Continue aspirin. Intolerant to statins. Chronic. No angina 

## 2015-03-22 NOTE — Assessment & Plan Note (Signed)
Discussed low-carb diet 
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
## 2015-03-22 NOTE — Progress Notes (Signed)
Pre visit review using our clinic review tool, if applicable. No additional management support is needed unless otherwise documented below in the visit note. 

## 2015-03-22 NOTE — Assessment & Plan Note (Signed)
Ceftin x10 d 

## 2015-03-22 NOTE — Assessment & Plan Note (Signed)
On Vit D 

## 2015-03-27 ENCOUNTER — Other Ambulatory Visit: Payer: Self-pay

## 2015-03-27 MED ORDER — OLMESARTAN MEDOXOMIL 20 MG PO TABS: ORAL_TABLET | ORAL | Status: DC

## 2015-04-25 ENCOUNTER — Telehealth: Payer: Self-pay

## 2015-04-25 NOTE — Telephone Encounter (Signed)
Called and spoke with pt and he stated he has been taking 1/2 tablet of Amlodipine daily and that he does not need a refill at this time. He stated he knows he was supposed to make an appointment with Dr. Stanford Breed a few months ago but he doesn't have the money for the co-pay. I explained to call us whenever he needs anything and to not let himself run out of medication. He stated he will call and schedule follow up appointment as soon as he can get the co-pay money together.

## 2015-05-14 ENCOUNTER — Other Ambulatory Visit: Payer: Self-pay

## 2015-05-14 MED ORDER — AMLODIPINE BESYLATE 5 MG PO TABS: ORAL_TABLET | ORAL | Status: DC

## 2015-05-14 NOTE — Telephone Encounter (Signed)
Ok to fill norvasc [facility]

## 2015-05-14 NOTE — Telephone Encounter (Signed)
Candice A Price, CMA at 04/25/2015 11:55 AM     Status: Signed       Expand All Collapse All   Called and spoke with pt and he stated he has been taking 1/2 tablet of Amlodipine daily and that he does not need a refill at this time. He stated he knows he was supposed to make an appointment with Dr. Stanford Breed a few months ago but he doesn't have the money for the co-pay. I explained to call us whenever he needs anything and to not let himself run out of medication. He stated he will call and schedule follow up appointment as soon as he can get the co-pay money together.

## 2015-05-14 NOTE — Telephone Encounter (Signed)
amLODipine (NORVASC) 5 MG tablet TAKE 1 TABLET BY MOUTH EVERY DAY

## 2015-05-27 ENCOUNTER — Telehealth: Payer: Self-pay | Admitting: Internal Medicine

## 2015-05-27 MED ORDER — LOSARTAN POTASSIUM 100 MG PO TABS: 100.0000 mg | ORAL_TABLET | Freq: Every day | ORAL | Status: DC

## 2015-05-27 NOTE — Telephone Encounter (Signed)
OK Losartan instead Thx

## 2015-05-27 NOTE — Telephone Encounter (Signed)
Pt states the pharmacy doesn't have the olmesartan (BENICAR) 20 MG tablet [[id number] and not sure when they will getting it back in. He would like for you to give him a call asap

## 2015-05-28 NOTE — Telephone Encounter (Signed)
Notified pt with md recommendation. Pt states pharmacy ended up calling him back late yesterday and stated that they will have the benicar today @ 4. Pt states he rather stay on Benicar...Joe [name]

## 2015-06-06 ENCOUNTER — Telehealth: Payer: Self-pay

## 2015-06-06 NOTE — Telephone Encounter (Signed)
FYI  Outreach to address AWV; Patient mentioned he tried new BP med, but made him very tired. He is now taking 1/2 pill (Losartan 50 instead of 100mg ) qd and BP is 117/72 FYI; to fup at CPE scheduled for 11/01  Did not restart Benicar but remains on Losartan 50mg  po qd

## 2015-06-06 NOTE — Telephone Encounter (Signed)
Call to discuss AWV and the patient prefers to just come in one time. Apt is at 8:15 and will defer to MD due to schedule and time conflict with doing after CPE,

## 2015-06-25 ENCOUNTER — Ambulatory Visit (INDEPENDENT_AMBULATORY_CARE_PROVIDER_SITE_OTHER): Payer: Medicare Other | Admitting: Internal Medicine

## 2015-06-25 ENCOUNTER — Other Ambulatory Visit (INDEPENDENT_AMBULATORY_CARE_PROVIDER_SITE_OTHER): Payer: Medicare Other

## 2015-06-25 ENCOUNTER — Encounter: Payer: Self-pay | Admitting: Internal Medicine

## 2015-06-25 VITALS — BP 125/80 | HR 86 | Ht 68.0 in | Wt 286.0 lb

## 2015-06-25 DIAGNOSIS — E538 Deficiency of other specified B group vitamins: Secondary | ICD-10-CM

## 2015-06-25 DIAGNOSIS — I2510 Atherosclerotic heart disease of native coronary artery without angina pectoris: Secondary | ICD-10-CM

## 2015-06-25 DIAGNOSIS — I10 Essential (primary) hypertension: Secondary | ICD-10-CM | POA: Diagnosis not present

## 2015-06-25 DIAGNOSIS — Z0000 Encounter for general adult medical examination without abnormal findings: Secondary | ICD-10-CM

## 2015-06-25 DIAGNOSIS — E559 Vitamin D deficiency, unspecified: Secondary | ICD-10-CM

## 2015-06-25 DIAGNOSIS — R7309 Other abnormal glucose: Secondary | ICD-10-CM | POA: Diagnosis not present

## 2015-06-25 DIAGNOSIS — M25512 Pain in left shoulder: Secondary | ICD-10-CM | POA: Diagnosis not present

## 2015-06-25 DIAGNOSIS — N320 Bladder-neck obstruction: Secondary | ICD-10-CM

## 2015-06-25 DIAGNOSIS — Z23 Encounter for immunization: Secondary | ICD-10-CM

## 2015-06-25 LAB — VITAMIN B12: Vitamin B-12: 282 pg/mL (ref 211–911)

## 2015-06-25 LAB — CBC WITH DIFFERENTIAL/PLATELET
Basophils Absolute: 0.1 10*3/uL (ref 0.0–0.1)
Basophils Relative: 0.9 % (ref 0.0–3.0)
EOS ABS: 0.2 10*3/uL (ref 0.0–0.7)
Eosinophils Relative: 2.6 % (ref 0.0–5.0)
HCT: 47.9 % (ref 39.0–52.0)
Hemoglobin: 16.3 g/dL (ref 13.0–17.0)
LYMPHS PCT: 35.4 % (ref 12.0–46.0)
Lymphs Abs: 2.9 10*3/uL (ref 0.7–4.0)
MCHC: 34 g/dL (ref 30.0–36.0)
MCV: 93.1 fl (ref 78.0–100.0)
MONO ABS: 0.7 10*3/uL (ref 0.1–1.0)
Monocytes Relative: 9.1 % (ref 3.0–12.0)
NEUTROS PCT: 52 % (ref 43.0–77.0)
Neutro Abs: 4.2 10*3/uL (ref 1.4–7.7)
PLATELETS: 310 10*3/uL (ref 150.0–400.0)
RBC: 5.15 Mil/uL (ref 4.22–5.81)
RDW: 13.7 % (ref 11.5–15.5)
WBC: 8.1 10*3/uL (ref 4.0–10.5)

## 2015-06-25 LAB — BASIC METABOLIC PANEL
BUN: 12 mg/dL (ref 6–23)
CALCIUM: 9.6 mg/dL (ref 8.4–10.5)
CO2: 27 mEq/L (ref 19–32)
CREATININE: 1.07 mg/dL (ref 0.40–1.50)
Chloride: 101 mEq/L (ref 96–112)
GFR: 73.27 mL/min (ref >60.00)
Glucose, Bld: 111 mg/dL — ABNORMAL HIGH (ref 70–99)
Potassium: 4.5 mEq/L (ref 3.5–5.1)
Sodium: 136 mEq/L (ref 135–145)

## 2015-06-25 LAB — LIPID PANEL
CHOLESTEROL: 177 mg/dL (ref 0–200)
HDL: 34.9 mg/dL — ABNORMAL LOW (ref >39.00)
LDL CALC: 123 mg/dL — AB (ref 0–99)
NonHDL: 141.77
TRIGLYCERIDES: 96 mg/dL (ref 0.0–149.0)
Total CHOL/HDL Ratio: 5
VLDL: 19.2 mg/dL (ref 0.0–40.0)

## 2015-06-25 LAB — URINALYSIS
Bilirubin Urine: NEGATIVE
HGB URINE DIPSTICK: NEGATIVE
Ketones, ur: NEGATIVE
LEUKOCYTES UA: NEGATIVE
NITRITE: NEGATIVE
PH: 6 (ref 5.0–8.0)
Specific Gravity, Urine: 1.02 (ref 1.000–1.030)
TOTAL PROTEIN, URINE-UPE24: NEGATIVE
Urine Glucose: NEGATIVE
Urobilinogen, UA: 1 (ref 0.0–1.0)

## 2015-06-25 LAB — HEPATIC FUNCTION PANEL
ALT: 32 U/L (ref 0–53)
AST: 22 U/L (ref 0–37)
Albumin: 4.2 g/dL (ref 3.5–5.2)
Alkaline Phosphatase: 62 U/L (ref 39–117)
BILIRUBIN DIRECT: 0.1 mg/dL (ref 0.0–0.3)
BILIRUBIN TOTAL: 0.8 mg/dL (ref 0.2–1.2)
Total Protein: 7.2 g/dL (ref 6.0–8.3)

## 2015-06-25 LAB — TSH: TSH: 0.4 u[IU]/mL (ref 0.35–4.50)

## 2015-06-25 LAB — PSA: PSA: 2.51 ng/mL (ref 0.10–4.00)

## 2015-06-25 LAB — HEMOGLOBIN A1C: HEMOGLOBIN A1C: 6 % (ref 4.6–6.5)

## 2015-06-25 NOTE — Patient Instructions (Signed)

## 2015-06-25 NOTE — Assessment & Plan Note (Signed)
Wt Readings from Last 3 Encounters:  06/25/15 286 lb (129.729 kg)  03/22/15 284 lb (128.822 kg)  01/15/15 282 lb (127.914 kg)

## 2015-06-25 NOTE — Assessment & Plan Note (Signed)
Here for medicare wellness/physical  Diet: heart healthy  Physical activity: sedentary  Depression/mood screen: negative  Hearing: intact to whispered voice  Visual acuity: grossly normal, performs annual eye exam  ADLs: capable  Fall risk: low Home safety: good  Cognitive evaluation: intact to orientation, naming, recall and repetition  EOL planning: adv directives, full code/ I agree  I have personally reviewed and have noted  1. The patient's medical, surgical and social history  2. Their use of alcohol, tobacco or illicit drugs  3. Their current medications and supplements  4. The patient's functional ability including ADL's, fall risks, home safety risks and hearing or visual impairment.  5. Diet and physical activities  6. Evidence for depression or mood disorders 7. The roster of all physicians providing medical care to patient - is listed in the Snapshot section of the chart and reviewed today.    Today patient counseled on age appropriate routine health concerns for screening and prevention, each reviewed and up to date or declined. Immunizations reviewed and up to date or declined. Labs ordered and reviewed. Risk factors for depression reviewed and negative. Hearing function and visual acuity are intact. ADLs screened and addressed as needed. Functional ability and level of safety reviewed and appropriate. Education, counseling and referrals performed based on assessed risks today. Patient provided with a copy of personalized plan for preventive services.

## 2015-06-25 NOTE — Assessment & Plan Note (Signed)
On B12 

## 2015-06-25 NOTE — Progress Notes (Signed)
Pre visit review using our clinic review tool, if applicable. No additional management support is needed unless otherwise documented below in the visit note. 

## 2015-06-25 NOTE — Assessment & Plan Note (Signed)
On Amlodipine, Benicar

## 2015-06-25 NOTE — Assessment & Plan Note (Signed)
10/16 L - chronic Ortho consult - pt will arrange

## 2015-06-25 NOTE — Assessment & Plan Note (Signed)
On Vit D 

## 2015-06-25 NOTE — Progress Notes (Signed)
Subjective:  Patient ID: Joe [name], male    DOB: [birth year]  Age: 69 y.o. MRN: [id number]  CC: No chief complaint on file.   HPI Joe [name] presents for a well exam. C/o L shoulder pain. F/u HTN, obesity, CAD f/u  Outpatient Prescriptions Prior to Visit  Medication Sig Dispense Refill  . amLODipine (NORVASC) 5 MG tablet TAKE 1 TABLET BY MOUTH EVERY DAY 30 tablet 3  . aspirin 81 MG tablet Take 81 mg by mouth daily.      [name] azelastine (OPTIVAR) 0.05 % ophthalmic solution 1 drop 2 (two) times daily.      [name] bismuth subsalicylate (PEPTO BISMOL) 262 MG/15ML suspension Take 15 mLs by mouth every 6 (six) hours as needed. For acid reflux or stomach upset    . cetirizine (ZYRTEC ALLERGY) 10 MG tablet Take 10 mg by mouth as needed.      . Cholecalciferol (VITAMIN D) 1000 UNITS capsule Take 1,000 Units by mouth daily.      . diazepam (VALIUM) 2 MG tablet Take 1 tablet (2 mg total) by mouth every 8 (eight) hours as needed. 90 tablet 1  . furosemide (LASIX) 20 MG tablet Take 1 tablet (20 mg total) by mouth daily as needed for edema. 30 tablet 11  . gabapentin (NEURONTIN) 100 MG capsule Take 1 capsule (100 mg total) by mouth 3 (three) times daily. 90 capsule 1  . ibuprofen (ADVIL,MOTRIN) 600 MG tablet Take 600 mg by mouth every 6 (six) hours as needed.      . Ipratropium-Albuterol (COMBIVENT RESPIMAT) 20-100 MCG/ACT AERS Inhale 2 Act into the lungs 4 (four) times daily - after meals and at bedtime. 1 Inhaler 11  . Linaclotide (LINZESS) 145 MCG CAPS capsule Take 1 capsule (145 mcg total) by mouth 1 day or 1 dose. 30 capsule 11  . meclizine (ANTIVERT) 25 MG tablet Take 1 tablet (25 mg total) by mouth 3 (three) times daily as needed. 30 tablet 1  . phentermine (ADIPEX-P) 37.5 MG tablet Take 1 tablet (37.5 mg total) by mouth daily before breakfast. 30 tablet 2  . primidone (MYSOLINE) 50 MG tablet Take 1 tablet (50 mg total) by mouth 3 (three) times daily. For tremor 90 tablet 3  . Pyridoxine  HCl (VITAMIN B-6) 500 MG tablet Take 500 mg by mouth daily.      . ranitidine (ZANTAC) 300 MG capsule Take 300 mg by mouth every evening.      . vitamin B-12 (CYANOCOBALAMIN) 1000 MCG tablet 1 1/2 tab po qd     . BENICAR 20 MG tablet Take 20 mg by mouth daily. Take 1 by mouth daily  8  . cefUROXime (CEFTIN) 500 MG tablet Take 1 tablet (500 mg total) by mouth 2 (two) times daily. 20 tablet 0   No facility-administered medications prior to visit.    ROS Review of Systems  Constitutional: Positive for unexpected weight change. Negative for appetite change and fatigue.  HENT: Negative for congestion, nosebleeds, sneezing, sore throat and trouble swallowing.   Eyes: Negative for itching and visual disturbance.  Respiratory: Negative for cough.   Cardiovascular: Negative for chest pain, palpitations and leg swelling.  Gastrointestinal: Negative for nausea, diarrhea, blood in stool and abdominal distention.  Genitourinary: Negative for frequency and hematuria.  Musculoskeletal: Positive for back pain, arthralgias and gait problem. Negative for joint swelling and neck pain.  Skin: Negative for rash.  Neurological: Negative for dizziness, tremors, speech difficulty and weakness.  Psychiatric/Behavioral: Negative for  sleep disturbance, dysphoric mood and agitation. The patient is not nervous/anxious.     Objective:  BP 125/80 mmHg  Pulse 86  Ht 5\' 8"  (1.727 m)  Wt 286 lb (129.729 kg)  BMI 43.50 kg/m2  SpO2 95%  BP Readings from Last 3 Encounters:  06/25/15 125/80  03/22/15 150/84  01/15/15 160/78    Wt Readings from Last 3 Encounters:  06/25/15 286 lb (129.729 kg)  03/22/15 284 lb (128.822 kg)  01/15/15 282 lb (127.914 kg)    Physical Exam  Constitutional: He is oriented to person, place, and time. He appears well-developed and well-nourished. No distress.  HENT:  Head: Normocephalic and atraumatic.  Right Ear: External ear normal.  Left Ear: External ear normal.  Nose: Nose  normal.  Mouth/Throat: Oropharynx is clear and moist. No oropharyngeal exudate.  Eyes: Conjunctivae and EOM are normal. Pupils are equal, round, and reactive to light. Right eye exhibits no discharge. Left eye exhibits no discharge. No scleral icterus.  Neck: Normal range of motion. Neck supple. No JVD present. No tracheal deviation present. No thyromegaly present.  Cardiovascular: Normal rate, regular rhythm, normal heart sounds and intact distal pulses.  Exam reveals no gallop and no friction rub.   No murmur heard. Pulmonary/Chest: Effort normal and breath sounds normal. No stridor. No respiratory distress. He has no wheezes. He has no rales. He exhibits no tenderness.  Abdominal: Soft. Bowel sounds are normal. He exhibits no distension and no mass. There is no tenderness. There is no rebound and no guarding.  Genitourinary: Guaiac negative stool. No penile tenderness.  Musculoskeletal: Normal range of motion. He exhibits no edema or tenderness.  Lymphadenopathy:    He has no cervical adenopathy.  Neurological: He is alert and oriented to person, place, and time. He has normal reflexes. No cranial nerve deficit. He exhibits normal muscle tone. Coordination normal.  Skin: Skin is warm and dry. No rash noted. He is not diaphoretic. No erythema. No pallor.  Psychiatric: He has a normal mood and affect. His behavior is normal. Judgment and thought content normal.  Pt declined rectal  Lab Results  Component Value Date   WBC 10.3 04/16/2014   HGB 16.7 04/16/2014   HCT 48.8 04/16/2014   PLT 281.0 04/16/2014   GLUCOSE 90 03/22/2015   CHOL 198 04/16/2014   TRIG 128.0 04/16/2014   HDL 31.60* 04/16/2014   LDLDIRECT 152.5 10/04/2009   LDLCALC 141* 04/16/2014   ALT 34 03/22/2015   AST 22 03/22/2015   NA 137 03/22/2015   K 4.0 03/22/2015   CL 101 03/22/2015   CREATININE 1.17 03/22/2015   BUN 11 03/22/2015   CO2 27 03/22/2015   TSH 0.89 04/16/2014   PSA 2.88 04/16/2014   HGBA1C 6.0  04/16/2014    Dg Hip Complete Left  01/01/2014  CLINICAL DATA:  Left hip pain worsening for 1 week EXAM: LEFT HIP - COMPLETE 2+ VIEW COMPARISON:  10/07/2011 FINDINGS: Five views of the left hip submitted. No acute fracture or subluxation. Mild degenerative changes with mild narrowing of superior joint space. Minimal spurring of left superior acetabulum. Mild spurring of humeral head. Mild degenerative changes right hip joint. Degenerative changes lower lumbar spine. IMPRESSION: No acute fracture or subluxation.  Mild degenerative changes. Electronically Signed   By: Lahoma Crocker M.D.   On: 01/01/2014 08:57    Assessment & Plan:   Diagnoses and all orders for this visit:  Well adult exam -     Basic metabolic panel; Future -  CBC with Differential/Platelet; Future -     Hepatic function panel; Future -     Hemoglobin A1c; Future -     Lipid panel; Future -     Vitamin B12; Future -     Urinalysis; Future -     TSH; Future -     PSA; Future  B12 deficiency -     Basic metabolic panel; Future -     CBC with Differential/Platelet; Future -     Hepatic function panel; Future -     Hemoglobin A1c; Future -     Lipid panel; Future -     Vitamin B12; Future -     Urinalysis; Future -     TSH; Future -     PSA; Future  Pain in joint of left shoulder -     Basic metabolic panel; Future -     CBC with Differential/Platelet; Future -     Hepatic function panel; Future -     Hemoglobin A1c; Future -     Lipid panel; Future -     Vitamin B12; Future -     Urinalysis; Future -     TSH; Future -     PSA; Future  Vitamin D deficiency -     Basic metabolic panel; Future -     CBC with Differential/Platelet; Future -     Hepatic function panel; Future -     Hemoglobin A1c; Future -     Lipid panel; Future -     Vitamin B12; Future -     Urinalysis; Future -     TSH; Future -     PSA; Future  Essential hypertension -     Basic metabolic panel; Future -     CBC with  Differential/Platelet; Future -     Hepatic function panel; Future -     Hemoglobin A1c; Future -     Lipid panel; Future -     Vitamin B12; Future -     Urinalysis; Future -     TSH; Future -     PSA; Future  Atherosclerosis of native coronary artery of native heart without angina pectoris -     Basic metabolic panel; Future -     CBC with Differential/Platelet; Future -     Hepatic function panel; Future -     Hemoglobin A1c; Future -     Lipid panel; Future -     Vitamin B12; Future -     Urinalysis; Future -     TSH; Future -     PSA; Future  Morbid obesity, unspecified obesity type ([place]) -     Basic metabolic panel; Future -     CBC with Differential/Platelet; Future -     Hepatic function panel; Future -     Hemoglobin A1c; Future -     Lipid panel; Future -     Vitamin B12; Future -     Urinalysis; Future -     TSH; Future -     PSA; Future  Elevated glucose -     Basic metabolic panel; Future -     CBC with Differential/Platelet; Future -     Hepatic function panel; Future -     Hemoglobin A1c; Future -     Lipid panel; Future -     Vitamin B12; Future -     Urinalysis; Future -     TSH; Future -  PSA; Future  Bladder neck obstruction -     Basic metabolic panel; Future -     CBC with Differential/Platelet; Future -     Hepatic function panel; Future -     Hemoglobin A1c; Future -     Lipid panel; Future -     Vitamin B12; Future -     Urinalysis; Future -     TSH; Future -     PSA; Future   I have discontinued Joe [name]'s cefUROXime and BENICAR. I am also having him maintain his cetirizine, Vitamin D, vitamin B-6, vitamin B-12, ranitidine, azelastine, ibuprofen, aspirin, Ipratropium-Albuterol, bismuth subsalicylate, primidone, Linaclotide, meclizine, diazepam, furosemide, gabapentin, phentermine, amLODipine, and losartan.  Meds ordered this encounter  Medications  . losartan (COZAAR) 100 MG tablet    Sig: Take 1 tablet by mouth daily.      Follow-up: Return in about 4 months (around 10/23/2015) for a follow-up visit.  Walker Kehr, MD

## 2015-06-25 NOTE — Assessment & Plan Note (Signed)
Continue aspirin. Intolerant to statins. Chronic. No angina 

## 2015-10-01 ENCOUNTER — Other Ambulatory Visit: Payer: Self-pay | Admitting: *Deleted

## 2015-10-01 MED ORDER — AMLODIPINE BESYLATE 5 MG PO TABS: ORAL_TABLET | ORAL | Status: DC

## 2015-10-02 ENCOUNTER — Other Ambulatory Visit: Payer: Self-pay | Admitting: *Deleted

## 2015-10-02 MED ORDER — AMLODIPINE BESYLATE 5 MG PO TABS: ORAL_TABLET | ORAL | Status: DC

## 2015-10-04 ENCOUNTER — Other Ambulatory Visit: Payer: Self-pay

## 2015-10-04 MED ORDER — LOSARTAN POTASSIUM 100 MG PO TABS: 100.0000 mg | ORAL_TABLET | Freq: Every day | ORAL | Status: DC

## 2015-10-24 ENCOUNTER — Ambulatory Visit: Payer: Medicare Other | Admitting: Internal Medicine

## 2015-11-20 ENCOUNTER — Ambulatory Visit: Payer: Medicare Other | Admitting: Internal Medicine

## 2015-11-30 ENCOUNTER — Other Ambulatory Visit: Payer: Self-pay | Admitting: Cardiology

## 2015-12-03 ENCOUNTER — Ambulatory Visit (INDEPENDENT_AMBULATORY_CARE_PROVIDER_SITE_OTHER): Payer: Medicare Other | Admitting: Internal Medicine

## 2015-12-03 ENCOUNTER — Encounter: Payer: Self-pay | Admitting: Internal Medicine

## 2015-12-03 ENCOUNTER — Other Ambulatory Visit (INDEPENDENT_AMBULATORY_CARE_PROVIDER_SITE_OTHER): Payer: Medicare Other

## 2015-12-03 VITALS — BP 140/70 | HR 69 | Temp 98.4°F | Wt 286.0 lb

## 2015-12-03 DIAGNOSIS — J0100 Acute maxillary sinusitis, unspecified: Secondary | ICD-10-CM | POA: Diagnosis not present

## 2015-12-03 DIAGNOSIS — J309 Allergic rhinitis, unspecified: Secondary | ICD-10-CM

## 2015-12-03 DIAGNOSIS — I10 Essential (primary) hypertension: Secondary | ICD-10-CM

## 2015-12-03 DIAGNOSIS — L570 Actinic keratosis: Secondary | ICD-10-CM

## 2015-12-03 DIAGNOSIS — M25512 Pain in left shoulder: Secondary | ICD-10-CM

## 2015-12-03 LAB — BASIC METABOLIC PANEL
BUN: 13 mg/dL (ref 6–23)
CHLORIDE: 103 meq/L (ref 96–112)
CO2: 25 mEq/L (ref 19–32)
Calcium: 9.4 mg/dL (ref 8.4–10.5)
Creatinine, Ser: 1.08 mg/dL (ref 0.40–1.50)
GFR: 72.4 mL/min (ref >60.00)
Glucose, Bld: 95 mg/dL (ref 70–99)
POTASSIUM: 4 meq/L (ref 3.5–5.1)
Sodium: 137 mEq/L (ref 135–145)

## 2015-12-03 MED ORDER — LORATADINE 10 MG PO TABS: 10.0000 mg | ORAL_TABLET | Freq: Every day | ORAL | Status: DC

## 2015-12-03 MED ORDER — METHYLPREDNISOLONE ACETATE 80 MG/ML IJ SUSP
80.0000 mg | Freq: Once | INTRAMUSCULAR | Status: AC
  Administered 2015-12-03: 80 mg via INTRAMUSCULAR

## 2015-12-03 MED ORDER — FLUTICASONE PROPIONATE 50 MCG/ACT NA SUSP: 2.0000 | Freq: Every day | NASAL | Status: DC

## 2015-12-03 MED ORDER — CEFDINIR 300 MG PO CAPS: 300.0000 mg | ORAL_CAPSULE | Freq: Two times a day (BID) | ORAL | Status: DC

## 2015-12-03 NOTE — Assessment & Plan Note (Addendum)
Worse Claritin/Flonase Depomedrol IM 80 mg

## 2015-12-03 NOTE — Assessment & Plan Note (Signed)
Cefdinir x10 d 

## 2015-12-03 NOTE — Assessment & Plan Note (Signed)
Cryo  

## 2015-12-03 NOTE — Progress Notes (Signed)
Pre visit review using our clinic review tool, if applicable. No additional management support is needed unless otherwise documented below in the visit note. 

## 2015-12-03 NOTE — Patient Instructions (Signed)
   Postprocedure instructions :     Keep the wounds clean. You can wash them with liquid soap and water. Pat dry with gauze or a Kleenex tissue  Before applying antibiotic ointment and a Band-Aid.   You need to report immediately  if  any signs of infection develop.    

## 2015-12-03 NOTE — Progress Notes (Signed)
Subjective:  Patient ID: Joe [name], male    DOB: October 17, 1945  Age: 70 y.o. MRN: [id number]  CC: No chief complaint on file.   HPI Joe [name] presents for HTN, OA, allergies f/u. C/o allergies. C/o URI sx's - thick nasal d/c. C/o L shoulder pain - worse. C/o skin lesions  Outpatient Prescriptions Prior to Visit  Medication Sig Dispense Refill  . amLODipine (NORVASC) 5 MG tablet TAKE 1 TABLET BY MOUTH EVERY DAY. NEED OV. 30 tablet 0  . aspirin 81 MG tablet Take 81 mg by mouth daily.      [name] azelastine (OPTIVAR) 0.05 % ophthalmic solution 1 drop 2 (two) times daily.      [name] bismuth subsalicylate (PEPTO BISMOL) 262 MG/15ML suspension Take 15 mLs by mouth every 6 (six) hours as needed. For acid reflux or stomach upset    . cetirizine (ZYRTEC ALLERGY) 10 MG tablet Take 10 mg by mouth as needed.      . Cholecalciferol (VITAMIN D) 1000 UNITS capsule Take 1,000 Units by mouth daily.      . diazepam (VALIUM) 2 MG tablet Take 1 tablet (2 mg total) by mouth every 8 (eight) hours as needed. 90 tablet 1  . furosemide (LASIX) 20 MG tablet Take 1 tablet (20 mg total) by mouth daily as needed for edema. 30 tablet 11  . gabapentin (NEURONTIN) 100 MG capsule Take 1 capsule (100 mg total) by mouth 3 (three) times daily. 90 capsule 1  . ibuprofen (ADVIL,MOTRIN) 600 MG tablet Take 600 mg by mouth every 6 (six) hours as needed.      . Ipratropium-Albuterol (COMBIVENT RESPIMAT) 20-100 MCG/ACT AERS Inhale 2 Act into the lungs 4 (four) times daily - after meals and at bedtime. 1 Inhaler 11  . Linaclotide (LINZESS) 145 MCG CAPS capsule Take 1 capsule (145 mcg total) by mouth 1 day or 1 dose. 30 capsule 11  . losartan (COZAAR) 100 MG tablet Take 1 tablet (100 mg total) by mouth daily. 90 tablet 1  . meclizine (ANTIVERT) 25 MG tablet Take 1 tablet (25 mg total) by mouth 3 (three) times daily as needed. 30 tablet 1  . phentermine (ADIPEX-P) 37.5 MG tablet Take 1 tablet (37.5 mg total) by mouth daily before  breakfast. 30 tablet 2  . primidone (MYSOLINE) 50 MG tablet Take 1 tablet (50 mg total) by mouth 3 (three) times daily. For tremor 90 tablet 3  . Pyridoxine HCl (VITAMIN B-6) 500 MG tablet Take 500 mg by mouth daily.      . ranitidine (ZANTAC) 300 MG capsule Take 300 mg by mouth every evening.      . vitamin B-12 (CYANOCOBALAMIN) 1000 MCG tablet 1 1/2 tab po qd      No facility-administered medications prior to visit.    ROS Review of Systems  Constitutional: Positive for fatigue. Negative for appetite change and unexpected weight change.  HENT: Positive for congestion, postnasal drip and sinus pressure. Negative for nosebleeds, sneezing, sore throat and trouble swallowing.   Eyes: Negative for itching and visual disturbance.  Respiratory: Negative for cough.   Cardiovascular: Negative for chest pain, palpitations and leg swelling.  Gastrointestinal: Negative for nausea, diarrhea, blood in stool and abdominal distention.  Genitourinary: Negative for frequency and hematuria.  Musculoskeletal: Positive for back pain, arthralgias and neck pain. Negative for joint swelling and gait problem.  Skin: Negative for rash.  Neurological: Negative for dizziness, tremors, speech difficulty and weakness.  Psychiatric/Behavioral: Negative for sleep disturbance, dysphoric  mood and agitation. The patient is not nervous/anxious.     Objective:  BP 160/70 mmHg  Pulse 69  Temp(Src) 98.4 F (36.9 C) (Oral)  Wt 286 lb (129.729 kg)  SpO2 97%  BP Readings from Last 3 Encounters:  12/03/15 160/70  06/25/15 125/80  03/22/15 150/84    Wt Readings from Last 3 Encounters:  12/03/15 286 lb (129.729 kg)  06/25/15 286 lb (129.729 kg)  03/22/15 284 lb (128.822 kg)    Physical Exam  Constitutional: He is oriented to person, place, and time. He appears well-developed. No distress.  NAD  Eyes: Conjunctivae are normal. Pupils are equal, round, and reactive to light.  Neck: Normal range of motion. No JVD  present. No thyromegaly present.  Cardiovascular: Normal rate, regular rhythm, normal heart sounds and intact distal pulses.  Exam reveals no gallop and no friction rub.   No murmur heard. Pulmonary/Chest: Effort normal and breath sounds normal. No respiratory distress. He has no wheezes. He has no rales. He exhibits no tenderness.  Abdominal: Soft. Bowel sounds are normal. He exhibits no distension and no mass. There is no tenderness. There is no rebound and no guarding.  Musculoskeletal: Normal range of motion. He exhibits tenderness. He exhibits no edema.  Lymphadenopathy:    He has no cervical adenopathy.  Neurological: He is alert and oriented to person, place, and time. He has normal reflexes. No cranial nerve deficit. He exhibits normal muscle tone. He displays a negative Romberg sign. Coordination abnormal. Gait normal.  Skin: Skin is warm and dry. No rash noted.  Psychiatric: He has a normal mood and affect. His behavior is normal. Judgment and thought content normal.  swollen nasal mucosa LS, knees - tender L shoulder w/decr ROM - painful AKs on skin Obese    Procedure Note :     Procedure : Cryosurgery   Indication:   Actinic keratosis(es) x2   Risks including unsuccessful procedure , bleeding, infection, bruising, scar, a need for a repeat  procedure and others were explained to the patient in detail as well as the benefits. Informed consent was obtained verbally.    2 lesion(s)  on face/leg  was/were treated with liquid nitrogen on a Q-tip in a usual fasion . Band-Aid was applied and antibiotic ointment was given for a later use.   Tolerated well. Complications none.     Lab Results  Component Value Date   WBC 8.1 06/25/2015   HGB 16.3 06/25/2015   HCT 47.9 06/25/2015   PLT 310.0 06/25/2015   GLUCOSE 111* 06/25/2015   CHOL 177 06/25/2015   TRIG 96.0 06/25/2015   HDL 34.90* 06/25/2015   LDLDIRECT 152.5 10/04/2009   LDLCALC 123* 06/25/2015   ALT 32 06/25/2015     AST 22 06/25/2015   NA 136 06/25/2015   K 4.5 06/25/2015   CL 101 06/25/2015   CREATININE 1.07 06/25/2015   BUN 12 06/25/2015   CO2 27 06/25/2015   TSH 0.40 06/25/2015   PSA 2.51 06/25/2015   HGBA1C 6.0 06/25/2015    Dg Hip Complete Left  01/01/2014  CLINICAL DATA:  Left hip pain worsening for 1 week EXAM: LEFT HIP - COMPLETE 2+ VIEW COMPARISON:  10/07/2011 FINDINGS: Five views of the left hip submitted. No acute fracture or subluxation. Mild degenerative changes with mild narrowing of superior joint space. Minimal spurring of left superior acetabulum. Mild spurring of humeral head. Mild degenerative changes right hip joint. Degenerative changes lower lumbar spine. IMPRESSION: No acute fracture or  subluxation.  Mild degenerative changes. Electronically Signed   By: Lahoma Crocker M.D.   On: 01/01/2014 08:57    Assessment & Plan:   There are no diagnoses linked to this encounter. I am having Mr. Borsuk maintain his cetirizine, Vitamin D, vitamin B-6, vitamin B-12, ranitidine, azelastine, ibuprofen, aspirin, Ipratropium-Albuterol, bismuth subsalicylate, primidone, Linaclotide, meclizine, diazepam, furosemide, gabapentin, phentermine, amLODipine, and losartan.  No orders of the defined types were placed in this encounter.     Follow-up: No Follow-up on file.  Walker Kehr, MD

## 2015-12-03 NOTE — Assessment & Plan Note (Signed)
On Amlodipine, Benicar 

## 2015-12-03 NOTE — Assessment & Plan Note (Signed)
Dr Gladstone Lighter

## 2015-12-03 NOTE — Addendum Note (Signed)
Addended by: Cresenciano Lick on: 12/03/2015 11:51 AM   Modules accepted: Orders

## 2015-12-09 ENCOUNTER — Other Ambulatory Visit: Payer: Self-pay | Admitting: Cardiology

## 2015-12-09 NOTE — Telephone Encounter (Signed)
Rx(s) sent to pharmacy electronically.  

## 2015-12-18 ENCOUNTER — Encounter: Payer: Self-pay | Admitting: Internal Medicine

## 2015-12-23 ENCOUNTER — Other Ambulatory Visit: Payer: Self-pay

## 2015-12-23 ENCOUNTER — Telehealth: Payer: Self-pay | Admitting: Cardiology

## 2015-12-23 MED ORDER — AMLODIPINE BESYLATE 5 MG PO TABS: 5.0000 mg | ORAL_TABLET | Freq: Every day | ORAL | Status: DC

## 2015-12-23 NOTE — Telephone Encounter (Signed)
Pt made past due appt for 01-02-16 needs refills until then amlodipine to [facility] in [place]

## 2016-01-02 ENCOUNTER — Ambulatory Visit (INDEPENDENT_AMBULATORY_CARE_PROVIDER_SITE_OTHER): Payer: Medicare Other | Admitting: Physician Assistant

## 2016-01-02 ENCOUNTER — Encounter: Payer: Self-pay | Admitting: Physician Assistant

## 2016-01-02 VITALS — BP 142/86 | HR 85 | Ht 68.0 in | Wt 282.0 lb

## 2016-01-02 DIAGNOSIS — Z720 Tobacco use: Secondary | ICD-10-CM | POA: Diagnosis not present

## 2016-01-02 DIAGNOSIS — I10 Essential (primary) hypertension: Secondary | ICD-10-CM | POA: Diagnosis not present

## 2016-01-02 DIAGNOSIS — E785 Hyperlipidemia, unspecified: Secondary | ICD-10-CM

## 2016-01-02 DIAGNOSIS — I2510 Atherosclerotic heart disease of native coronary artery without angina pectoris: Secondary | ICD-10-CM | POA: Diagnosis not present

## 2016-01-02 MED ORDER — AMLODIPINE BESYLATE 5 MG PO TABS: 5.0000 mg | ORAL_TABLET | Freq: Every day | ORAL | Status: DC

## 2016-01-02 NOTE — Patient Instructions (Signed)
Medication Instructions:   Your physician recommends that you continue on your current medications as directed. Please refer to the Current Medication list given to you today.     If you need a refill on your cardiac medications before your next appointment, please call your pharmacy.  Labwork: NONE ORDER TODAY    Testing/Procedures: NONE ORDER TODAY    Follow-Up:  Your physician wants you to follow-up in: ONE YEAR WITH [facility]  You will receive a reminder letter in the mail two months in advance. If you don't receive a letter, please call our office to schedule the follow-up appointment.     Any Other Special Instructions Will Be Listed Below (If Applicable).                                                                                                                                                   
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
## 2016-01-11 DIAGNOSIS — G8929 Other chronic pain: Secondary | ICD-10-CM | POA: Diagnosis not present

## 2016-01-11 DIAGNOSIS — M25512 Pain in left shoulder: Secondary | ICD-10-CM | POA: Diagnosis not present

## 2016-01-25 DIAGNOSIS — M25512 Pain in left shoulder: Secondary | ICD-10-CM | POA: Diagnosis not present

## 2016-01-25 DIAGNOSIS — G8929 Other chronic pain: Secondary | ICD-10-CM | POA: Diagnosis not present

## 2016-01-30 DIAGNOSIS — M25512 Pain in left shoulder: Secondary | ICD-10-CM | POA: Diagnosis not present

## 2016-03-03 ENCOUNTER — Other Ambulatory Visit (INDEPENDENT_AMBULATORY_CARE_PROVIDER_SITE_OTHER): Payer: Medicare Other

## 2016-03-03 ENCOUNTER — Ambulatory Visit (INDEPENDENT_AMBULATORY_CARE_PROVIDER_SITE_OTHER): Payer: Medicare Other | Admitting: Internal Medicine

## 2016-03-03 ENCOUNTER — Encounter: Payer: Self-pay | Admitting: Internal Medicine

## 2016-03-03 VITALS — BP 140/70 | HR 81 | Wt 287.0 lb

## 2016-03-03 DIAGNOSIS — E538 Deficiency of other specified B group vitamins: Secondary | ICD-10-CM | POA: Diagnosis not present

## 2016-03-03 DIAGNOSIS — Z720 Tobacco use: Secondary | ICD-10-CM

## 2016-03-03 DIAGNOSIS — R7309 Other abnormal glucose: Secondary | ICD-10-CM

## 2016-03-03 DIAGNOSIS — I2510 Atherosclerotic heart disease of native coronary artery without angina pectoris: Secondary | ICD-10-CM

## 2016-03-03 DIAGNOSIS — I10 Essential (primary) hypertension: Secondary | ICD-10-CM

## 2016-03-03 DIAGNOSIS — R635 Abnormal weight gain: Secondary | ICD-10-CM

## 2016-03-03 DIAGNOSIS — J309 Allergic rhinitis, unspecified: Secondary | ICD-10-CM | POA: Diagnosis not present

## 2016-03-03 LAB — BASIC METABOLIC PANEL
BUN: 10 mg/dL (ref 6–23)
CALCIUM: 9.5 mg/dL (ref 8.4–10.5)
CO2: 27 mEq/L (ref 19–32)
CREATININE: 1.1 mg/dL (ref 0.40–1.50)
Chloride: 104 mEq/L (ref 96–112)
GFR: 70.83 mL/min (ref >60.00)
GLUCOSE: 110 mg/dL — AB (ref 70–99)
Potassium: 4.4 mEq/L (ref 3.5–5.1)
SODIUM: 138 meq/L (ref 135–145)

## 2016-03-03 LAB — HEMOGLOBIN A1C: HEMOGLOBIN A1C: 6 % (ref 4.6–6.5)

## 2016-03-03 LAB — TSH: TSH: 0.74 u[IU]/mL (ref 0.35–4.50)

## 2016-03-03 NOTE — Progress Notes (Signed)
Subjective:  Patient ID: Joe [name], male    DOB: 1946-05-15  Age: 70 y.o. MRN: [id number]  CC: No chief complaint on file.   HPI Joe [name] presents for CAD, HTN, DM, allergies f/u. C/o wt gain  Outpatient Prescriptions Prior to Visit  Medication Sig Dispense Refill  . amLODipine (NORVASC) 5 MG tablet Take 1 tablet (5 mg total) by mouth daily. 90 tablet 3  . aspirin 81 MG tablet Take 81 mg by mouth daily.      [name] bismuth subsalicylate (PEPTO BISMOL) 262 MG/15ML suspension Take 15 mLs by mouth every 6 (six) hours as needed. For acid reflux or stomach upset    . Cholecalciferol (VITAMIN D) 1000 UNITS capsule Take 1,000 Units by mouth daily.      . diazepam (VALIUM) 2 MG tablet Take 1 tablet (2 mg total) by mouth every 8 (eight) hours as needed. 90 tablet 1  . fluticasone (FLONASE) 50 MCG/ACT nasal spray Place 2 sprays into both nostrils daily. 16 g 6  . furosemide (LASIX) 20 MG tablet Take 1 tablet (20 mg total) by mouth daily as needed for edema. 30 tablet 11  . Ipratropium-Albuterol (COMBIVENT RESPIMAT) 20-100 MCG/ACT AERS Inhale 2 Act into the lungs 4 (four) times daily - after meals and at bedtime. 1 Inhaler 11  . loratadine (CLARITIN) 10 MG tablet Take 1 tablet (10 mg total) by mouth daily. 100 tablet 3  . losartan (COZAAR) 100 MG tablet Take 1 tablet (100 mg total) by mouth daily. 90 tablet 1  . meclizine (ANTIVERT) 25 MG tablet Take 1 tablet (25 mg total) by mouth 3 (three) times daily as needed. 30 tablet 1  . Pyridoxine HCl (VITAMIN B-6) 500 MG tablet Take 500 mg by mouth daily.      . vitamin B-12 (CYANOCOBALAMIN) 1000 MCG tablet 1 1/2 tab po qd      No facility-administered medications prior to visit.    ROS Review of Systems  Constitutional: Positive for fatigue and unexpected weight change. Negative for appetite change.  HENT: Negative for congestion, nosebleeds, sneezing, sore throat and trouble swallowing.   Eyes: Negative for itching and visual  disturbance.  Respiratory: Negative for cough.   Cardiovascular: Negative for chest pain, palpitations and leg swelling.  Gastrointestinal: Negative for nausea, diarrhea, blood in stool and abdominal distention.  Genitourinary: Negative for frequency and hematuria.  Musculoskeletal: Positive for back pain and arthralgias. Negative for joint swelling, gait problem and neck pain.  Skin: Negative for rash.  Neurological: Negative for dizziness, tremors, speech difficulty and weakness.  Psychiatric/Behavioral: Negative for suicidal ideas, sleep disturbance, dysphoric mood and agitation. The patient is not nervous/anxious.     Objective:  BP 140/70 mmHg  Pulse 81  Wt 287 lb (130.182 kg)  SpO2 95%  BP Readings from Last 3 Encounters:  03/03/16 140/70  01/02/16 142/86  12/03/15 140/70    Wt Readings from Last 3 Encounters:  03/03/16 287 lb (130.182 kg)  01/02/16 282 lb (127.914 kg)  12/03/15 286 lb (129.729 kg)    Physical Exam  Constitutional: He is oriented to person, place, and time. He appears well-developed. No distress.  NAD  HENT:  Mouth/Throat: Oropharynx is clear and moist.  Eyes: Conjunctivae are normal. Pupils are equal, round, and reactive to light.  Neck: Normal range of motion. No JVD present. No thyromegaly present.  Cardiovascular: Normal rate, regular rhythm, normal heart sounds and intact distal pulses.  Exam reveals no gallop and no friction rub.  No murmur heard. Pulmonary/Chest: Effort normal and breath sounds normal. No respiratory distress. He has no wheezes. He has no rales. He exhibits no tenderness.  Abdominal: Soft. Bowel sounds are normal. He exhibits no distension and no mass. There is no tenderness. There is no rebound and no guarding.  Musculoskeletal: Normal range of motion. He exhibits tenderness. He exhibits no edema.  Lymphadenopathy:    He has no cervical adenopathy.  Neurological: He is alert and oriented to person, place, and time. He has  normal reflexes. No cranial nerve deficit. He exhibits normal muscle tone. He displays a negative Romberg sign. Coordination and gait normal.  Skin: Skin is warm and dry. No rash noted.  Psychiatric: He has a normal mood and affect. His behavior is normal. Judgment and thought content normal.  LS tender Obese  Lab Results  Component Value Date   WBC 8.1 06/25/2015   HGB 16.3 06/25/2015   HCT 47.9 06/25/2015   PLT 310.0 06/25/2015   GLUCOSE 95 12/03/2015   CHOL 177 06/25/2015   TRIG 96.0 06/25/2015   HDL 34.90* 06/25/2015   LDLDIRECT 152.5 10/04/2009   LDLCALC 123* 06/25/2015   ALT 32 06/25/2015   AST 22 06/25/2015   NA 137 12/03/2015   K 4.0 12/03/2015   CL 103 12/03/2015   CREATININE 1.08 12/03/2015   BUN 13 12/03/2015   CO2 25 12/03/2015   TSH 0.40 06/25/2015   PSA 2.51 06/25/2015   HGBA1C 6.0 06/25/2015    Dg Hip Complete Left  01/01/2014  CLINICAL DATA:  Left hip pain worsening for 1 week EXAM: LEFT HIP - COMPLETE 2+ VIEW COMPARISON:  10/07/2011 FINDINGS: Five views of the left hip submitted. No acute fracture or subluxation. Mild degenerative changes with mild narrowing of superior joint space. Minimal spurring of left superior acetabulum. Mild spurring of humeral head. Mild degenerative changes right hip joint. Degenerative changes lower lumbar spine. IMPRESSION: No acute fracture or subluxation.  Mild degenerative changes. Electronically Signed   By: Lahoma Crocker M.D.   On: 01/01/2014 08:57    Assessment & Plan:   There are no diagnoses linked to this encounter. I am having Mr. Amory maintain his Vitamin D, vitamin B-6, vitamin B-12, aspirin, Ipratropium-Albuterol, bismuth subsalicylate, meclizine, diazepam, furosemide, losartan, loratadine, fluticasone, and amLODipine.  No orders of the defined types were placed in this encounter.     Follow-up: No Follow-up on file.  Walker Kehr, MD

## 2016-03-03 NOTE — Assessment & Plan Note (Signed)
On ASA 

## 2016-03-03 NOTE — Assessment & Plan Note (Signed)
Smoking discussed <1 ppd

## 2016-03-03 NOTE — Assessment & Plan Note (Signed)
Cont with Claritin/Flonase

## 2016-03-03 NOTE — Assessment & Plan Note (Signed)
Cont w/Amlodipine, Benicar

## 2016-03-03 NOTE — Assessment & Plan Note (Signed)
On B12 

## 2016-03-03 NOTE — Assessment & Plan Note (Signed)
Better diet was discussed

## 2016-03-03 NOTE — Progress Notes (Signed)
Pre visit review using our clinic review tool, if applicable. No additional management support is needed unless otherwise documented below in the visit note. 

## 2016-06-09 ENCOUNTER — Ambulatory Visit: Payer: Medicare Other | Admitting: Internal Medicine

## 2016-06-29 ENCOUNTER — Ambulatory Visit (INDEPENDENT_AMBULATORY_CARE_PROVIDER_SITE_OTHER): Payer: Medicare Other | Admitting: Internal Medicine

## 2016-06-29 ENCOUNTER — Telehealth: Payer: Self-pay

## 2016-06-29 ENCOUNTER — Encounter: Payer: Self-pay | Admitting: Internal Medicine

## 2016-06-29 ENCOUNTER — Other Ambulatory Visit (INDEPENDENT_AMBULATORY_CARE_PROVIDER_SITE_OTHER): Payer: Medicare Other

## 2016-06-29 VITALS — BP 158/72 | HR 88 | Temp 98.8°F | Wt 286.0 lb

## 2016-06-29 DIAGNOSIS — I10 Essential (primary) hypertension: Secondary | ICD-10-CM

## 2016-06-29 DIAGNOSIS — H547 Unspecified visual loss: Secondary | ICD-10-CM

## 2016-06-29 DIAGNOSIS — R7309 Other abnormal glucose: Secondary | ICD-10-CM | POA: Diagnosis not present

## 2016-06-29 DIAGNOSIS — K5904 Chronic idiopathic constipation: Secondary | ICD-10-CM

## 2016-06-29 DIAGNOSIS — I2510 Atherosclerotic heart disease of native coronary artery without angina pectoris: Secondary | ICD-10-CM | POA: Diagnosis not present

## 2016-06-29 DIAGNOSIS — Z23 Encounter for immunization: Secondary | ICD-10-CM

## 2016-06-29 DIAGNOSIS — J449 Chronic obstructive pulmonary disease, unspecified: Secondary | ICD-10-CM

## 2016-06-29 LAB — BASIC METABOLIC PANEL
BUN: 13 mg/dL (ref 6–23)
CALCIUM: 9.7 mg/dL (ref 8.4–10.5)
CO2: 27 mEq/L (ref 19–32)
Chloride: 102 mEq/L (ref 96–112)
Creatinine, Ser: 1.06 mg/dL (ref 0.40–1.50)
GFR: 73.85 mL/min (ref >60.00)
GLUCOSE: 102 mg/dL — AB (ref 70–99)
Potassium: 4.3 mEq/L (ref 3.5–5.1)
SODIUM: 136 meq/L (ref 135–145)

## 2016-06-29 LAB — HEMOGLOBIN A1C: HEMOGLOBIN A1C: 6.1 % (ref 4.6–6.5)

## 2016-06-29 NOTE — Telephone Encounter (Signed)
Per dr Alain Marion and patient, patient has requested regular dose flu even though he is at age for high dose----regular dose flu given per dr Alain Marion

## 2016-06-29 NOTE — Assessment & Plan Note (Signed)
Combivent Do not smoke

## 2016-06-29 NOTE — Assessment & Plan Note (Signed)
Linzess prn 

## 2016-06-29 NOTE — Assessment & Plan Note (Signed)
Wt Readings from Last 3 Encounters:  06/29/16 286 lb (129.7 kg)  03/03/16 287 lb (130.2 kg)  01/02/16 282 lb (127.9 kg)

## 2016-06-29 NOTE — Assessment & Plan Note (Signed)
Ophth appt pending

## 2016-06-29 NOTE — Assessment & Plan Note (Signed)
ASA

## 2016-06-29 NOTE — Progress Notes (Signed)
Subjective:  Patient ID: Joe [name], male    DOB: 05-26-1946  Age: 69 y.o. MRN: [id number]  CC: No chief complaint on file.   HPI Joe [name] presents for constipation - Linzess helps; HTN, OA f/u. C/o double vision at times.   Outpatient Medications Prior to Visit  Medication Sig Dispense Refill  . amLODipine (NORVASC) 5 MG tablet Take 1 tablet (5 mg total) by mouth daily. 90 tablet 3  . aspirin 81 MG tablet Take 81 mg by mouth daily.      [name] bismuth subsalicylate (PEPTO BISMOL) 262 MG/15ML suspension Take 15 mLs by mouth every 6 (six) hours as needed. For acid reflux or stomach upset    . Cholecalciferol (VITAMIN D) 1000 UNITS capsule Take 1,000 Units by mouth daily.      . diazepam (VALIUM) 2 MG tablet Take 1 tablet (2 mg total) by mouth every 8 (eight) hours as needed. 90 tablet 1  . fluticasone (FLONASE) 50 MCG/ACT nasal spray Place 2 sprays into both nostrils daily. 16 g 6  . furosemide (LASIX) 20 MG tablet Take 1 tablet (20 mg total) by mouth daily as needed for edema. 30 tablet 11  . Ipratropium-Albuterol (COMBIVENT RESPIMAT) 20-100 MCG/ACT AERS Inhale 2 Act into the lungs 4 (four) times daily - after meals and at bedtime. 1 Inhaler 11  . loratadine (CLARITIN) 10 MG tablet Take 1 tablet (10 mg total) by mouth daily. 100 tablet 3  . losartan (COZAAR) 100 MG tablet Take 1 tablet (100 mg total) by mouth daily. 90 tablet 1  . meclizine (ANTIVERT) 25 MG tablet Take 1 tablet (25 mg total) by mouth 3 (three) times daily as needed. 30 tablet 1  . Pyridoxine HCl (VITAMIN B-6) 500 MG tablet Take 500 mg by mouth daily.      . vitamin B-12 (CYANOCOBALAMIN) 1000 MCG tablet 1 1/2 tab po qd      No facility-administered medications prior to visit.     ROS Review of Systems  Constitutional: Negative for appetite change, fatigue and unexpected weight change.  HENT: Negative for congestion, nosebleeds, sneezing, sore throat and trouble swallowing.   Eyes: Positive for visual  disturbance. Negative for itching.  Respiratory: Positive for shortness of breath. Negative for cough.   Cardiovascular: Negative for chest pain, palpitations and leg swelling.  Gastrointestinal: Positive for constipation. Negative for abdominal distention, blood in stool, diarrhea and nausea.  Genitourinary: Negative for frequency and hematuria.  Musculoskeletal: Positive for arthralgias, back pain and myalgias. Negative for gait problem, joint swelling and neck pain.  Skin: Negative for rash.  Neurological: Negative for dizziness, tremors, speech difficulty and weakness.  Psychiatric/Behavioral: Negative for agitation, dysphoric mood, sleep disturbance and suicidal ideas. The patient is not nervous/anxious.     Objective:  BP (!) 158/72   Pulse 88   Temp 98.8 F (37.1 C)   Wt 286 lb (129.7 kg)   SpO2 95%   BMI 43.49 kg/m   BP Readings from Last 3 Encounters:  06/29/16 (!) 158/72  03/03/16 140/70  01/02/16 (!) 142/86    Wt Readings from Last 3 Encounters:  06/29/16 286 lb (129.7 kg)  03/03/16 287 lb (130.2 kg)  01/02/16 282 lb (127.9 kg)    Physical Exam  Constitutional: He is oriented to person, place, and time. He appears well-developed. No distress.  NAD  HENT:  Mouth/Throat: Oropharynx is clear and moist.  Eyes: Conjunctivae are normal. Pupils are equal, round, and reactive to light.  Neck:  Normal range of motion. No JVD present. No thyromegaly present.  Cardiovascular: Normal rate, regular rhythm, normal heart sounds and intact distal pulses.  Exam reveals no gallop and no friction rub.   No murmur heard. Pulmonary/Chest: Effort normal and breath sounds normal. No respiratory distress. He has no wheezes. He has no rales. He exhibits no tenderness.  Abdominal: Soft. Bowel sounds are normal. He exhibits no distension and no mass. There is no tenderness. There is no rebound and no guarding.  Musculoskeletal: Normal range of motion. He exhibits no edema or tenderness.    Lymphadenopathy:    He has no cervical adenopathy.  Neurological: He is alert and oriented to person, place, and time. He has normal reflexes. No cranial nerve deficit. He exhibits normal muscle tone. He displays a negative Romberg sign. Coordination and gait normal.  Skin: Skin is warm and dry. No rash noted.  Psychiatric: He has a normal mood and affect. His behavior is normal. Judgment and thought content normal.  Obese Moves w/difficulty  Lab Results  Component Value Date   WBC 8.1 06/25/2015   HGB 16.3 06/25/2015   HCT 47.9 06/25/2015   PLT 310.0 06/25/2015   GLUCOSE 110 (H) 03/03/2016   CHOL 177 06/25/2015   TRIG 96.0 06/25/2015   HDL 34.90 (L) 06/25/2015   LDLDIRECT 152.5 10/04/2009   LDLCALC 123 (H) 06/25/2015   ALT 32 06/25/2015   AST 22 06/25/2015   NA 138 03/03/2016   K 4.4 03/03/2016   CL 104 03/03/2016   CREATININE 1.10 03/03/2016   BUN 10 03/03/2016   CO2 27 03/03/2016   TSH 0.74 03/03/2016   PSA 2.51 06/25/2015   HGBA1C 6.0 03/03/2016    Dg Hip Complete Left  Result Date: 01/01/2014 CLINICAL DATA:  Left hip pain worsening for 1 week EXAM: LEFT HIP - COMPLETE 2+ VIEW COMPARISON:  10/07/2011 FINDINGS: Five views of the left hip submitted. No acute fracture or subluxation. Mild degenerative changes with mild narrowing of superior joint space. Minimal spurring of left superior acetabulum. Mild spurring of humeral head. Mild degenerative changes right hip joint. Degenerative changes lower lumbar spine. IMPRESSION: No acute fracture or subluxation.  Mild degenerative changes. Electronically Signed   By: Lahoma Crocker M.D.   On: 01/01/2014 08:57    Assessment & Plan:   There are no diagnoses linked to this encounter. I am having Mr. Grimm maintain his Vitamin D, vitamin B-6, vitamin B-12, aspirin, Ipratropium-Albuterol, bismuth subsalicylate, meclizine, diazepam, furosemide, losartan, loratadine, fluticasone, and amLODipine.  No orders of the defined types were  placed in this encounter.    Follow-up: No Follow-up on file.  Walker Kehr, MD

## 2016-06-29 NOTE — Assessment & Plan Note (Signed)
Risks associated with treatment noncompliance were discussed. Compliance was encouraged. Take amlodipine and benicar when you get back home

## 2016-06-29 NOTE — Progress Notes (Signed)
Pre visit review using our clinic review tool, if applicable. No additional management support is needed unless otherwise documented below in the visit note. 

## 2016-09-07 ENCOUNTER — Encounter (HOSPITAL_COMMUNITY): Payer: Self-pay

## 2016-09-07 ENCOUNTER — Emergency Department (HOSPITAL_COMMUNITY): Payer: Medicare Other

## 2016-09-07 ENCOUNTER — Telehealth: Payer: Self-pay | Admitting: Internal Medicine

## 2016-09-07 ENCOUNTER — Emergency Department (HOSPITAL_COMMUNITY)
Admission: EM | Admit: 2016-09-07 | Discharge: 2016-09-07 | Disposition: A | Discharge status: Home / Self Care | Payer: Medicare Other | Attending: Emergency Medicine | Admitting: Emergency Medicine

## 2016-09-07 DIAGNOSIS — J322 Chronic ethmoidal sinusitis: Secondary | ICD-10-CM | POA: Diagnosis not present

## 2016-09-07 DIAGNOSIS — J449 Chronic obstructive pulmonary disease, unspecified: Secondary | ICD-10-CM | POA: Diagnosis not present

## 2016-09-07 DIAGNOSIS — Z85828 Personal history of other malignant neoplasm of skin: Secondary | ICD-10-CM | POA: Insufficient documentation

## 2016-09-07 DIAGNOSIS — I2510 Atherosclerotic heart disease of native coronary artery without angina pectoris: Secondary | ICD-10-CM | POA: Diagnosis not present

## 2016-09-07 DIAGNOSIS — R42 Dizziness and giddiness: Secondary | ICD-10-CM | POA: Insufficient documentation

## 2016-09-07 DIAGNOSIS — Z79899 Other long term (current) drug therapy: Secondary | ICD-10-CM | POA: Diagnosis not present

## 2016-09-07 DIAGNOSIS — F17200 Nicotine dependence, unspecified, uncomplicated: Secondary | ICD-10-CM | POA: Insufficient documentation

## 2016-09-07 DIAGNOSIS — I10 Essential (primary) hypertension: Secondary | ICD-10-CM | POA: Diagnosis not present

## 2016-09-07 DIAGNOSIS — G629 Polyneuropathy, unspecified: Secondary | ICD-10-CM

## 2016-09-07 DIAGNOSIS — Z951 Presence of aortocoronary bypass graft: Secondary | ICD-10-CM | POA: Diagnosis not present

## 2016-09-07 DIAGNOSIS — R202 Paresthesia of skin: Secondary | ICD-10-CM | POA: Diagnosis present

## 2016-09-07 DIAGNOSIS — Z7982 Long term (current) use of aspirin: Secondary | ICD-10-CM | POA: Diagnosis not present

## 2016-09-07 DIAGNOSIS — R200 Anesthesia of skin: Secondary | ICD-10-CM | POA: Diagnosis not present

## 2016-09-07 LAB — I-STAT TROPONIN, ED
Troponin i, poc: 0 ng/mL (ref 0.00–0.08)
Troponin i, poc: 0.01 ng/mL (ref 0.00–0.08)

## 2016-09-07 LAB — BASIC METABOLIC PANEL
Anion gap: 11 (ref 5–15)
BUN: 6 mg/dL (ref 6–20)
CHLORIDE: 102 mmol/L (ref 101–111)
CO2: 24 mmol/L (ref 22–32)
CREATININE: 0.87 mg/dL (ref 0.61–1.24)
Calcium: 9.7 mg/dL (ref 8.9–10.3)
GFR calc Af Amer: >60 mL/min (ref >60)
GFR calc non Af Amer: >60 mL/min (ref >60)
Glucose, Bld: 102 mg/dL — ABNORMAL HIGH (ref 65–99)
Potassium: 3.8 mmol/L (ref 3.5–5.1)
SODIUM: 137 mmol/L (ref 135–145)

## 2016-09-07 LAB — URINALYSIS, ROUTINE W REFLEX MICROSCOPIC
BILIRUBIN URINE: NEGATIVE
GLUCOSE, UA: NEGATIVE mg/dL
HGB URINE DIPSTICK: NEGATIVE
Ketones, ur: NEGATIVE mg/dL
LEUKOCYTES UA: NEGATIVE
Nitrite: NEGATIVE
PROTEIN: NEGATIVE mg/dL
Specific Gravity, Urine: 1.012 (ref 1.005–1.030)
pH: 5 (ref 5.0–8.0)

## 2016-09-07 LAB — MAGNESIUM: Magnesium: 2.1 mg/dL (ref 1.7–2.4)

## 2016-09-07 LAB — CBC
HCT: 47 % (ref 39.0–52.0)
Hemoglobin: 16.2 g/dL (ref 13.0–17.0)
MCH: 32.3 pg (ref 26.0–34.0)
MCHC: 34.5 g/dL (ref 30.0–36.0)
MCV: 93.6 fL (ref 78.0–100.0)
Platelets: 324 10*3/uL (ref 150–400)
RBC: 5.02 MIL/uL (ref 4.22–5.81)
RDW: 13.6 % (ref 11.5–15.5)
WBC: 9.1 10*3/uL (ref 4.0–10.5)

## 2016-09-07 LAB — TSH: TSH: 0.567 u[IU]/mL (ref 0.350–4.500)

## 2016-09-07 MED ORDER — GABAPENTIN 100 MG PO CAPS: 100.0000 mg | ORAL_CAPSULE | Freq: Three times a day (TID) | ORAL | 0 refills | Status: DC

## 2016-09-07 MED ORDER — ONDANSETRON HCL 4 MG PO TABS: 4.0000 mg | ORAL_TABLET | Freq: Three times a day (TID) | ORAL | 0 refills | Status: DC | PRN

## 2016-09-07 MED ORDER — AMOXICILLIN-POT CLAVULANATE 875-125 MG PO TABS: 1.0000 | ORAL_TABLET | Freq: Two times a day (BID) | ORAL | 0 refills | Status: AC

## 2016-09-07 MED ORDER — HYDROCODONE-ACETAMINOPHEN 5-325 MG PO TABS: 1.0000 | ORAL_TABLET | ORAL | 0 refills | Status: DC | PRN

## 2016-09-07 NOTE — Telephone Encounter (Signed)
Patient Name: Joe [name]  DOB: July 03, 1946    Initial Comment Caller states he may have Peripheral Neuropathy. His hands are numb, no upper body strength. Hips are catching when he walks.   Nurse Assessment  Nurse: Leilani Merl, RN, Heather Date/Time (Eastern Time): 09/07/2016 2:29:57 PM  Confirm and document reason for call. If symptomatic, describe symptoms. ---Caller states he may have Peripheral Neuropathy. His hands are numb, no upper body strength. Hips are catching when he walks. This started when he did not have heat for 3 days, he has had this since.  Does the patient have any new or worsening symptoms? ---Yes  Will a triage be completed? ---Yes  Related visit to physician within the last 2 weeks? ---No  Does the PT have any chronic conditions? (i.e. diabetes, asthma, etc.) ---Yes  List chronic conditions. ---See MR  Is this a behavioral health or substance abuse call? ---No     Guidelines    Guideline Title Affirmed Question Affirmed Notes  Weakness (Generalized) and Fatigue Patient sounds very sick or weak to the triager    Final Disposition User   Go to ED Now (or PCP triage) Leilani Merl, RN, [facility] - ED   Disagree/Comply: Comply

## 2016-09-07 NOTE — ED Notes (Signed)
Patient transported to X-ray 
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
## 2016-09-07 NOTE — ED Notes (Signed)
EDP at bedside  

## 2016-09-07 NOTE — ED Triage Notes (Signed)
Pt complaining of numbness and tingling in extremities. Pt states hx of peripheral neuropathy. Pt states increased dizziness 4-5 days ago. Pt a/o x 4 at triage. Pt with no focal neuro deficits at triage.

## 2016-09-17 ENCOUNTER — Other Ambulatory Visit: Payer: Self-pay | Admitting: Internal Medicine

## 2016-09-28 ENCOUNTER — Telehealth: Payer: Self-pay | Admitting: Internal Medicine

## 2016-09-28 NOTE — Telephone Encounter (Signed)
Spoke with patient regarding awv. Patient stated that he would like to wait until later this year (2018), to schedule his awv.

## 2016-09-29 ENCOUNTER — Ambulatory Visit: Payer: Medicare Other | Admitting: Internal Medicine

## 2016-10-06 ENCOUNTER — Encounter: Payer: Self-pay | Admitting: Internal Medicine

## 2016-10-06 ENCOUNTER — Encounter (HOSPITAL_COMMUNITY): Payer: Self-pay

## 2016-10-06 ENCOUNTER — Emergency Department (HOSPITAL_COMMUNITY): Payer: Medicare Other

## 2016-10-06 ENCOUNTER — Ambulatory Visit (INDEPENDENT_AMBULATORY_CARE_PROVIDER_SITE_OTHER): Payer: Medicare Other | Admitting: Internal Medicine

## 2016-10-06 ENCOUNTER — Inpatient Hospital Stay (HOSPITAL_COMMUNITY)
Admission: EM | Admit: 2016-10-06 | Discharge: 2016-10-11 | DRG: 472 | Disposition: A | Discharge status: Home Health | Payer: Medicare Other | Attending: Internal Medicine | Admitting: Internal Medicine

## 2016-10-06 VITALS — BP 136/60 | HR 73 | Temp 98.1°F | Resp 16 | Ht 69.0 in | Wt 284.0 lb

## 2016-10-06 DIAGNOSIS — Z888 Allergy status to other drugs, medicaments and biological substances status: Secondary | ICD-10-CM | POA: Diagnosis not present

## 2016-10-06 DIAGNOSIS — R32 Unspecified urinary incontinence: Secondary | ICD-10-CM | POA: Diagnosis not present

## 2016-10-06 DIAGNOSIS — W19XXXA Unspecified fall, initial encounter: Secondary | ICD-10-CM | POA: Diagnosis present

## 2016-10-06 DIAGNOSIS — R29898 Other symptoms and signs involving the musculoskeletal system: Secondary | ICD-10-CM | POA: Diagnosis not present

## 2016-10-06 DIAGNOSIS — L570 Actinic keratosis: Secondary | ICD-10-CM | POA: Diagnosis present

## 2016-10-06 DIAGNOSIS — R627 Adult failure to thrive: Secondary | ICD-10-CM | POA: Insufficient documentation

## 2016-10-06 DIAGNOSIS — F17200 Nicotine dependence, unspecified, uncomplicated: Secondary | ICD-10-CM | POA: Diagnosis not present

## 2016-10-06 DIAGNOSIS — R251 Tremor, unspecified: Secondary | ICD-10-CM | POA: Diagnosis present

## 2016-10-06 DIAGNOSIS — M5001 Cervical disc disorder with myelopathy,  high cervical region: Secondary | ICD-10-CM | POA: Diagnosis not present

## 2016-10-06 DIAGNOSIS — Z6841 Body Mass Index (BMI) 40.0 and over, adult: Secondary | ICD-10-CM | POA: Diagnosis not present

## 2016-10-06 DIAGNOSIS — R531 Weakness: Secondary | ICD-10-CM | POA: Diagnosis not present

## 2016-10-06 DIAGNOSIS — E785 Hyperlipidemia, unspecified: Secondary | ICD-10-CM | POA: Diagnosis present

## 2016-10-06 DIAGNOSIS — E559 Vitamin D deficiency, unspecified: Secondary | ICD-10-CM | POA: Diagnosis not present

## 2016-10-06 DIAGNOSIS — Z79899 Other long term (current) drug therapy: Secondary | ICD-10-CM

## 2016-10-06 DIAGNOSIS — E291 Testicular hypofunction: Secondary | ICD-10-CM | POA: Diagnosis present

## 2016-10-06 DIAGNOSIS — G589 Mononeuropathy, unspecified: Secondary | ICD-10-CM | POA: Diagnosis not present

## 2016-10-06 DIAGNOSIS — Z885 Allergy status to narcotic agent status: Secondary | ICD-10-CM

## 2016-10-06 DIAGNOSIS — J449 Chronic obstructive pulmonary disease, unspecified: Secondary | ICD-10-CM | POA: Diagnosis present

## 2016-10-06 DIAGNOSIS — I739 Peripheral vascular disease, unspecified: Secondary | ICD-10-CM | POA: Diagnosis not present

## 2016-10-06 DIAGNOSIS — I119 Hypertensive heart disease without heart failure: Secondary | ICD-10-CM | POA: Diagnosis present

## 2016-10-06 DIAGNOSIS — Z8249 Family history of ischemic heart disease and other diseases of the circulatory system: Secondary | ICD-10-CM | POA: Diagnosis not present

## 2016-10-06 DIAGNOSIS — M2578 Osteophyte, vertebrae: Secondary | ICD-10-CM | POA: Diagnosis not present

## 2016-10-06 DIAGNOSIS — M4322 Fusion of spine, cervical region: Secondary | ICD-10-CM | POA: Diagnosis not present

## 2016-10-06 DIAGNOSIS — Z951 Presence of aortocoronary bypass graft: Secondary | ICD-10-CM | POA: Diagnosis not present

## 2016-10-06 DIAGNOSIS — G250 Essential tremor: Secondary | ICD-10-CM | POA: Diagnosis present

## 2016-10-06 DIAGNOSIS — E784 Other hyperlipidemia: Secondary | ICD-10-CM | POA: Diagnosis not present

## 2016-10-06 DIAGNOSIS — Z981 Arthrodesis status: Secondary | ICD-10-CM

## 2016-10-06 DIAGNOSIS — R296 Repeated falls: Secondary | ICD-10-CM | POA: Diagnosis not present

## 2016-10-06 DIAGNOSIS — I2510 Atherosclerotic heart disease of native coronary artery without angina pectoris: Secondary | ICD-10-CM | POA: Diagnosis not present

## 2016-10-06 DIAGNOSIS — G629 Polyneuropathy, unspecified: Secondary | ICD-10-CM | POA: Diagnosis not present

## 2016-10-06 DIAGNOSIS — Z7982 Long term (current) use of aspirin: Secondary | ICD-10-CM

## 2016-10-06 DIAGNOSIS — M4802 Spinal stenosis, cervical region: Secondary | ICD-10-CM | POA: Diagnosis not present

## 2016-10-06 DIAGNOSIS — G959 Disease of spinal cord, unspecified: Secondary | ICD-10-CM | POA: Diagnosis not present

## 2016-10-06 DIAGNOSIS — G8250 Quadriplegia, unspecified: Secondary | ICD-10-CM | POA: Diagnosis not present

## 2016-10-06 DIAGNOSIS — I10 Essential (primary) hypertension: Secondary | ICD-10-CM | POA: Diagnosis not present

## 2016-10-06 DIAGNOSIS — M6281 Muscle weakness (generalized): Secondary | ICD-10-CM | POA: Diagnosis not present

## 2016-10-06 DIAGNOSIS — N39498 Other specified urinary incontinence: Secondary | ICD-10-CM | POA: Diagnosis not present

## 2016-10-06 DIAGNOSIS — J9811 Atelectasis: Secondary | ICD-10-CM | POA: Diagnosis not present

## 2016-10-06 DIAGNOSIS — Z419 Encounter for procedure for purposes other than remedying health state, unspecified: Secondary | ICD-10-CM

## 2016-10-06 DIAGNOSIS — E538 Deficiency of other specified B group vitamins: Secondary | ICD-10-CM | POA: Diagnosis not present

## 2016-10-06 DIAGNOSIS — R200 Anesthesia of skin: Secondary | ICD-10-CM | POA: Diagnosis not present

## 2016-10-06 DIAGNOSIS — M4712 Other spondylosis with myelopathy, cervical region: Secondary | ICD-10-CM | POA: Diagnosis not present

## 2016-10-06 LAB — URINALYSIS, ROUTINE W REFLEX MICROSCOPIC
Bilirubin Urine: NEGATIVE
Glucose, UA: NEGATIVE mg/dL
HGB URINE DIPSTICK: NEGATIVE
Ketones, ur: NEGATIVE mg/dL
LEUKOCYTES UA: NEGATIVE
Nitrite: NEGATIVE
Protein, ur: NEGATIVE mg/dL
SPECIFIC GRAVITY, URINE: 1.012 (ref 1.005–1.030)
pH: 5 (ref 5.0–8.0)

## 2016-10-06 LAB — COMPREHENSIVE METABOLIC PANEL
ALK PHOS: 58 U/L (ref 38–126)
ALT: 27 U/L (ref 17–63)
AST: 25 U/L (ref 15–41)
Albumin: 3.8 g/dL (ref 3.5–5.0)
Anion gap: 12 (ref 5–15)
BUN: 11 mg/dL (ref 6–20)
CALCIUM: 9.2 mg/dL (ref 8.9–10.3)
CO2: 26 mmol/L (ref 22–32)
CREATININE: 0.98 mg/dL (ref 0.61–1.24)
Chloride: 99 mmol/L — ABNORMAL LOW (ref 101–111)
Glucose, Bld: 97 mg/dL (ref 65–99)
Potassium: 3.8 mmol/L (ref 3.5–5.1)
Sodium: 137 mmol/L (ref 135–145)
Total Bilirubin: 0.9 mg/dL (ref 0.3–1.2)
Total Protein: 6.7 g/dL (ref 6.5–8.1)

## 2016-10-06 LAB — I-STAT CHEM 8, ED
BUN: 12 mg/dL (ref 6–20)
CALCIUM ION: 1.16 mmol/L (ref 1.15–1.40)
CREATININE: 0.8 mg/dL (ref 0.61–1.24)
Chloride: 99 mmol/L — ABNORMAL LOW (ref 101–111)
Glucose, Bld: 95 mg/dL (ref 65–99)
HEMATOCRIT: 43 % (ref 39.0–52.0)
HEMOGLOBIN: 14.6 g/dL (ref 13.0–17.0)
Potassium: 3.7 mmol/L (ref 3.5–5.1)
SODIUM: 140 mmol/L (ref 135–145)
TCO2: 27 mmol/L (ref 0–100)

## 2016-10-06 LAB — CBC WITH DIFFERENTIAL/PLATELET
Basophils Absolute: 0 10*3/uL (ref 0.0–0.1)
Basophils Relative: 1 %
EOS PCT: 0 %
Eosinophils Absolute: 0 10*3/uL (ref 0.0–0.7)
HEMATOCRIT: 43 % (ref 39.0–52.0)
HEMOGLOBIN: 14.5 g/dL (ref 13.0–17.0)
LYMPHS ABS: 2.5 10*3/uL (ref 0.7–4.0)
LYMPHS PCT: 29 %
MCH: 31.7 pg (ref 26.0–34.0)
MCHC: 33.7 g/dL (ref 30.0–36.0)
MCV: 94.1 fL (ref 78.0–100.0)
Monocytes Absolute: 0.6 10*3/uL (ref 0.1–1.0)
Monocytes Relative: 7 %
NEUTROS ABS: 5.4 10*3/uL (ref 1.7–7.7)
NEUTROS PCT: 63 %
Platelets: 265 10*3/uL (ref 150–400)
RBC: 4.57 MIL/uL (ref 4.22–5.81)
RDW: 13.3 % (ref 11.5–15.5)
WBC: 8.5 10*3/uL (ref 4.0–10.5)

## 2016-10-06 LAB — RETICULOCYTES
RBC.: 4.69 MIL/uL (ref 4.22–5.81)
Retic Count, Absolute: 61 10*3/uL (ref 19.0–186.0)
Retic Ct Pct: 1.3 % (ref 0.4–3.1)

## 2016-10-06 LAB — CSF CELL COUNT WITH DIFFERENTIAL
RBC Count, CSF: 835 /mm3 — ABNORMAL HIGH
TUBE #: 2
WBC, CSF: 2 /mm3 (ref 0–5)

## 2016-10-06 LAB — FERRITIN: FERRITIN: 230 ng/mL (ref 24–336)

## 2016-10-06 LAB — TSH: TSH: 0.464 u[IU]/mL (ref 0.350–4.500)

## 2016-10-06 LAB — C-REACTIVE PROTEIN: CRP: <0.8 mg/dL (ref <1.0)

## 2016-10-06 LAB — IRON AND TIBC
Iron: 121 ug/dL (ref 45–182)
SATURATION RATIOS: 32 % (ref 17.9–39.5)
TIBC: 374 ug/dL (ref 250–450)
UIBC: 253 ug/dL

## 2016-10-06 LAB — FOLATE: FOLATE: 12.1 ng/mL (ref >5.9)

## 2016-10-06 LAB — LACTATE DEHYDROGENASE: LDH: 159 U/L (ref 98–192)

## 2016-10-06 LAB — SEDIMENTATION RATE: Sed Rate: 18 mm/hr — ABNORMAL HIGH (ref 0–16)

## 2016-10-06 LAB — PROTEIN AND GLUCOSE, CSF
GLUCOSE CSF: 57 mg/dL (ref 40–70)
Total  Protein, CSF: 94 mg/dL — ABNORMAL HIGH (ref 15–45)

## 2016-10-06 LAB — VITAMIN B12: Vitamin B-12: 254 pg/mL (ref 180–914)

## 2016-10-06 LAB — CK: CK TOTAL: 187 U/L (ref 49–397)

## 2016-10-06 MED ORDER — VITAMIN D 1000 UNITS PO TABS
1000.0000 [IU] | ORAL_TABLET | Freq: Every day | ORAL | Status: DC
Start: 2016-10-07 — End: 2016-10-08
  Administered 2016-10-07 – 2016-10-08 (×2): 1000 [IU] via ORAL
  Filled 2016-10-06 (×2): qty 1

## 2016-10-06 MED ORDER — LIDOCAINE-EPINEPHRINE (PF) 2 %-1:200000 IJ SOLN
20.0000 mL | Freq: Once | INTRAMUSCULAR | Status: AC
  Administered 2016-10-06: 20 mL via INTRADERMAL
  Filled 2016-10-06: qty 20

## 2016-10-06 MED ORDER — VITAMIN B-12 1000 MCG PO TABS
1500.0000 ug | ORAL_TABLET | Freq: Every day | ORAL | Status: DC
  Administered 2016-10-07 – 2016-10-09 (×3): 1500 ug via ORAL
  Filled 2016-10-06 (×2): qty 5
  Filled 2016-10-06: qty 1

## 2016-10-06 MED ORDER — IPRATROPIUM-ALBUTEROL 0.5-2.5 (3) MG/3ML IN SOLN: 3.0000 mL | Freq: Four times a day (QID) | RESPIRATORY_TRACT | Status: DC | PRN

## 2016-10-06 MED ORDER — AMLODIPINE BESYLATE 5 MG PO TABS
5.0000 mg | ORAL_TABLET | Freq: Every day | ORAL | Status: DC
  Administered 2016-10-07 – 2016-10-11 (×4): 5 mg via ORAL
  Filled 2016-10-06 (×5): qty 1

## 2016-10-06 MED ORDER — ONDANSETRON HCL 4 MG PO TABS
4.0000 mg | ORAL_TABLET | Freq: Four times a day (QID) | ORAL | Status: DC | PRN
Start: 2016-10-06 — End: 2016-10-09

## 2016-10-06 MED ORDER — GABAPENTIN 100 MG PO CAPS
100.0000 mg | ORAL_CAPSULE | Freq: Three times a day (TID) | ORAL | Status: DC
  Administered 2016-10-06 – 2016-10-11 (×11): 100 mg via ORAL
  Filled 2016-10-06 (×12): qty 1

## 2016-10-06 MED ORDER — ACETAMINOPHEN 325 MG PO TABS
650.0000 mg | ORAL_TABLET | Freq: Four times a day (QID) | ORAL | Status: DC | PRN
Start: 2016-10-06 — End: 2016-10-09

## 2016-10-06 MED ORDER — ENOXAPARIN SODIUM 60 MG/0.6ML ~~LOC~~ SOLN
60.0000 mg | SUBCUTANEOUS | Status: DC
  Administered 2016-10-06 – 2016-10-07 (×2): 60 mg via SUBCUTANEOUS
  Filled 2016-10-06 (×4): qty 0.6

## 2016-10-06 MED ORDER — ASPIRIN EC 81 MG PO TBEC
81.0000 mg | DELAYED_RELEASE_TABLET | Freq: Every day | ORAL | Status: DC
  Administered 2016-10-07 – 2016-10-11 (×4): 81 mg via ORAL
  Filled 2016-10-06 (×5): qty 1

## 2016-10-06 MED ORDER — HYDROCODONE-ACETAMINOPHEN 5-325 MG PO TABS
1.0000 | ORAL_TABLET | ORAL | Status: DC | PRN
  Filled 2016-10-06: qty 2

## 2016-10-06 MED ORDER — ONDANSETRON HCL 4 MG/2ML IJ SOLN: 4.0000 mg | Freq: Four times a day (QID) | INTRAMUSCULAR | Status: DC | PRN

## 2016-10-06 MED ORDER — ACETAMINOPHEN 650 MG RE SUPP: 650.0000 mg | Freq: Four times a day (QID) | RECTAL | Status: DC | PRN

## 2016-10-06 MED ORDER — FUROSEMIDE 20 MG PO TABS
20.0000 mg | ORAL_TABLET | Freq: Two times a day (BID) | ORAL | Status: DC
  Administered 2016-10-06 – 2016-10-11 (×6): 20 mg via ORAL
  Filled 2016-10-06 (×8): qty 1

## 2016-10-06 MED ORDER — LOSARTAN POTASSIUM 50 MG PO TABS
100.0000 mg | ORAL_TABLET | Freq: Every day | ORAL | Status: DC
  Administered 2016-10-07 – 2016-10-09 (×3): 100 mg via ORAL
  Filled 2016-10-06 (×3): qty 2

## 2016-10-06 MED ORDER — IPRATROPIUM-ALBUTEROL 20-100 MCG/ACT IN AERS: 2.0000 | INHALATION_SPRAY | Freq: Four times a day (QID) | RESPIRATORY_TRACT | Status: DC | PRN

## 2016-10-06 MED ORDER — VITAMIN B-6 100 MG PO TABS
100.0000 mg | ORAL_TABLET | Freq: Every day | ORAL | Status: DC
  Administered 2016-10-07 – 2016-10-11 (×4): 100 mg via ORAL
  Filled 2016-10-06 (×5): qty 1

## 2016-10-06 NOTE — ED Notes (Signed)
Report attempted 

## 2016-10-06 NOTE — Assessment & Plan Note (Signed)
Due to poss cervical myelopathy FTT: lives alone The pt was sent to [facility] ER for ER/inpatient care and w/up

## 2016-10-06 NOTE — ED Notes (Signed)
Pt remains in CT/Xray

## 2016-10-06 NOTE — ED Notes (Signed)
Meal tray ordered for pt at this time.

## 2016-10-06 NOTE — Assessment & Plan Note (Addendum)
Worsening sx's - poss cervical myelopathy FTT The pt was sent to [facility] ER for ER/inpatient care and w/up

## 2016-10-06 NOTE — ED Notes (Signed)
Per Dr. Tyrone Nine cell count is more important then culture.  Printed out rec's except for culture, reported to lab when dropped off.  Spoke with Thayer Jew in the lab and reiterated Dr. Sandi Mealy request. Redirected Thayer Jew to call Dr. Tyrone Nine for additional clarification as needed.

## 2016-10-06 NOTE — Progress Notes (Signed)
[employer] Admission paged to notify of patient arrival to unit.

## 2016-10-06 NOTE — ED Notes (Signed)
Lab to add on Vit D and Testosterone labs
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
## 2016-10-06 NOTE — Progress Notes (Signed)
Patient admitted to room 5M05 at 1845. Alert and in stable condition. Oriented to room, call light within reach and bed in lowest position for safety. Patient defiant at this time. States he is here to eat, sleep, and go home in the morning. Will notify oncoming shift.

## 2016-10-06 NOTE — Patient Instructions (Signed)
[place] now

## 2016-10-06 NOTE — ED Notes (Signed)
Pt is aware urine is needed, pt stated that they are unable to give a sample at this moment because they have not had anything to drink since 10am. Urinal at bedside.
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
## 2016-10-06 NOTE — Progress Notes (Signed)
Pre-visit discussion using our clinic review tool. No additional management support is needed unless otherwise documented below in the visit note.  

## 2016-10-06 NOTE — ED Triage Notes (Signed)
Patient transported via [place] from [facility] where the patient was being seen for worsening neuropathy and self care deficits. PTAR reports that the patient has had worsening weakness associated with neuropathy for the past 1-2 months. Vitals BP 136/60, HR 73, SPO2 98%.
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
## 2016-10-06 NOTE — ED Notes (Signed)
Obtained consent for LP

## 2016-10-07 ENCOUNTER — Observation Stay (HOSPITAL_BASED_OUTPATIENT_CLINIC_OR_DEPARTMENT_OTHER): Payer: Medicare Other

## 2016-10-07 DIAGNOSIS — G250 Essential tremor: Secondary | ICD-10-CM | POA: Diagnosis not present

## 2016-10-07 DIAGNOSIS — I739 Peripheral vascular disease, unspecified: Secondary | ICD-10-CM | POA: Diagnosis not present

## 2016-10-07 DIAGNOSIS — I10 Essential (primary) hypertension: Secondary | ICD-10-CM

## 2016-10-07 DIAGNOSIS — G629 Polyneuropathy, unspecified: Secondary | ICD-10-CM | POA: Diagnosis not present

## 2016-10-07 DIAGNOSIS — R29898 Other symptoms and signs involving the musculoskeletal system: Secondary | ICD-10-CM

## 2016-10-07 LAB — COMPREHENSIVE METABOLIC PANEL
ALBUMIN: 3.8 g/dL (ref 3.5–5.0)
ALT: 28 U/L (ref 17–63)
AST: 29 U/L (ref 15–41)
Alkaline Phosphatase: 63 U/L (ref 38–126)
Anion gap: 13 (ref 5–15)
BILIRUBIN TOTAL: 1.4 mg/dL — AB (ref 0.3–1.2)
BUN: 9 mg/dL (ref 6–20)
CHLORIDE: 98 mmol/L — AB (ref 101–111)
CO2: 27 mmol/L (ref 22–32)
Calcium: 9.2 mg/dL (ref 8.9–10.3)
Creatinine, Ser: 0.98 mg/dL (ref 0.61–1.24)
GFR calc Af Amer: >60 mL/min (ref >60)
GFR calc non Af Amer: >60 mL/min (ref >60)
Glucose, Bld: 94 mg/dL (ref 65–99)
POTASSIUM: 4.1 mmol/L (ref 3.5–5.1)
Sodium: 138 mmol/L (ref 135–145)
Total Protein: 6.9 g/dL (ref 6.5–8.1)

## 2016-10-07 LAB — CBC
HEMATOCRIT: 43.8 % (ref 39.0–52.0)
Hemoglobin: 14.8 g/dL (ref 13.0–17.0)
MCH: 31.9 pg (ref 26.0–34.0)
MCHC: 33.8 g/dL (ref 30.0–36.0)
MCV: 94.4 fL (ref 78.0–100.0)
Platelets: 273 10*3/uL (ref 150–400)
RBC: 4.64 MIL/uL (ref 4.22–5.81)
RDW: 13.4 % (ref 11.5–15.5)
WBC: 8.4 10*3/uL (ref 4.0–10.5)

## 2016-10-07 LAB — B. BURGDORFI ANTIBODIES: B burgdorferi Ab IgG+IgM: <0.91 {ISR} (ref 0.00–0.90)

## 2016-10-07 LAB — EPSTEIN-BARR VIRUS VCA, IGM: EBV VCA IgM: <36 U/mL (ref 0.0–35.9)

## 2016-10-07 LAB — RPR: RPR Ser Ql: NONREACTIVE

## 2016-10-07 LAB — TESTOSTERONE,FREE AND TOTAL
Testosterone, Free: 4.2 pg/mL — ABNORMAL LOW (ref 6.6–18.1)
Testosterone: 199 ng/dL — ABNORMAL LOW (ref 264–916)

## 2016-10-07 LAB — VITAMIN D 25 HYDROXY (VIT D DEFICIENCY, FRACTURES): Vit D, 25-Hydroxy: 9.5 ng/mL — ABNORMAL LOW (ref 30.0–100.0)

## 2016-10-07 LAB — EPSTEIN-BARR VIRUS VCA, IGG: EBV VCA IgG: >600 U/mL — ABNORMAL HIGH (ref 0.0–17.9)

## 2016-10-07 LAB — CMV IGM: CMV IgM: <30 AU/mL (ref 0.0–29.9)

## 2016-10-07 LAB — HIV ANTIBODY (ROUTINE TESTING W REFLEX): HIV Screen 4th Generation wRfx: NONREACTIVE

## 2016-10-07 MED ORDER — CYCLOBENZAPRINE HCL 10 MG PO TABS
10.0000 mg | ORAL_TABLET | Freq: Once | ORAL | Status: AC
  Administered 2016-10-07: 10 mg via ORAL
  Filled 2016-10-07: qty 1

## 2016-10-07 NOTE — Progress Notes (Signed)
PT Cancellation Note  Patient Details Name: Joe [name] MRN: [id number] DOB: Dec 06, 1945   Cancelled Treatment:    Reason Eval/Treat Not Completed: Fatigue/lethargy limiting ability to participate; patient just s/p OT session and reports fatigued due to needing to sleep.  Agreed to return later as able to complete PT eval.    Reginia Naas 10/07/2016, 10:01 AM  Magda Kiel, PT [phone] 10/07/2016

## 2016-10-07 NOTE — Progress Notes (Signed)
Received report from off-going nurse. Pt. Does not have an IV order but is already telling this Probation officer he is "absolutely not getting an IV." Pt. Refusing Lasix; clothing smells like urine and he refuses to change out of it. Pt. Also not compliant with fall prevention measures, alarm on, Unit AD made aware. Pt. States "I'm only here to sleep and I am leaving today." Per report pt. Has been dropping urine on the floor all night.  Jonelle Sidle, NT will offer the patient a bath.   Also, pt. Was walking independently for the NT staff last night but when the RNs discussed this with him he c/o his "legs not holding him up." Independent status removed.

## 2016-10-07 NOTE — Progress Notes (Signed)
Pt. Has order for medical record request; unclear what needs to be requested (?labs, xrays, notes.) Also, office currently closed so unable to obtain tonight. Paged Dr. Eliseo Squires to clarify request, no return call received. Will notify next shift to pass on to 1st shift tomorrow.

## 2016-10-07 NOTE — Evaluation (Signed)
Physical Therapy Evaluation Patient Details Name: Joe [name] MRN: [id number] DOB: 1946/03/13 Today's Date: 10/07/2016   History of Present Illness  Pt is a 71 y.o. male who presented to the ED with B hand numbness and B upper and lower body weakness with difficulty ambulating. MD concerned for Cuillain-Barre syndrome. Pt has a PMH significant for COPD, tobacco abuse, hypertension, hypogonadism, vitamin D deficiency, essential tremot, CAD s/p CABG, peripheral vascular disease, obesity, and hx of prior cervical surgery.  Clinical Impression  Patient presents with limited independence and safety due to imbalance, ataxia and general weakness.  Reports tingling in digits as well.  Feel he needs 24 hour assist, but he denies need for SNF at this time.  Has aide 6 days a week and family close by.  Would benefit from HHPT as plans d/c home when ready.  Will follow acutely.    Follow Up Recommendations Home health PT    Equipment Recommendations  None recommended by PT    Recommendations for Other Services       Precautions / Restrictions Precautions Precautions: Fall      Mobility  Bed Mobility Overal bed mobility: Modified Independent             General bed mobility comments: sit to supine  Transfers Overall transfer level: Needs assistance Equipment used: None Transfers: Sit to/from Stand Sit to Stand: Min guard         General transfer comment: for balance  Ambulation/Gait Ambulation/Gait assistance: Min assist Ambulation Distance (Feet): 125 Feet Assistive device: Rolling walker (2 wheeled) Gait Pattern/deviations: Step-through pattern;Wide base of support;Trunk flexed;Decreased stride length     General Gait Details: some ataxia noted with ambulation (initially tried without walker)  some improvement with UE support on walker, but pt frustrated that walker doesn't roll that well  Stairs            Wheelchair Mobility    Modified Rankin (Stroke  Patients Only)       Balance Overall balance assessment: Needs assistance Sitting-balance support: Feet supported Sitting balance-Leahy Scale: Good     Standing balance support: No upper extremity supported Standing balance-Leahy Scale: Fair Standing balance comment: can stand static without UE support, but with ambulation needs walker for balance/safety                             Pertinent Vitals/Pain Pain Assessment: Faces Faces Pain Scale: Hurts little more Pain Location: generalized Pain Descriptors / Indicators: Aching Pain Intervention(s): Monitored during session;Repositioned    Home Living Family/patient expects to be discharged to:: Private residence Living Arrangements: Alone Available Help at Discharge: Family;Available PRN/intermittently Type of Home: House Home Access: Stairs to enter   Entrance Stairs-Number of Steps: 2 Home Layout: One level Home Equipment: Walker - 4 wheels;Wheelchair - manual      Prior Function Level of Independence: Independent with assistive device(s)         Comments: Reports independence initially but later reporting LPN comes to assist with IADL and home management tasks. helps about 5 hr/day      Hand Dominance   Dominant Hand: Right    Extremity/Trunk Assessment   Upper Extremity Assessment Upper Extremity Assessment: Defer to OT evaluation    Lower Extremity Assessment Lower Extremity Assessment: Generalized weakness       Communication   Communication: No difficulties  Cognition Arousal/Alertness: Awake/alert Behavior During Therapy: WFL for tasks assessed/performed Overall Cognitive Status: No family/caregiver  present to determine baseline cognitive functioning Area of Impairment: Safety/judgement;Memory     Memory: Decreased short-term memory   Safety/Judgement: Decreased awareness of deficits          General Comments General comments (skin integrity, edema, etc.): Discussed pain,  reports only takes Alevel at home, as cannot tolerate Codiene and felt Flexeril last night made his legs jump; also discussed HHPT and need for walker at home    Exercises     Assessment/Plan    PT Assessment Patient needs continued PT services  PT Problem List Decreased balance;Decreased activity tolerance;Decreased mobility;Decreased coordination;Decreased knowledge of use of DME;Pain;Decreased safety awareness          PT Treatment Interventions DME instruction;Gait training;Balance training;Therapeutic exercise;Therapeutic activities;Cognitive remediation;Patient/family education;Stair training    PT Goals (Current goals can be found in the Care Plan section)  Acute Rehab PT Goals Patient Stated Goal: to go home with his dog PT Goal Formulation: With patient Time For Goal Achievement: 10/14/16 Potential to Achieve Goals: Good    Frequency Min 3X/week   Barriers to discharge        Co-evaluation               End of Session Equipment Utilized During Treatment: Gait belt Activity Tolerance: Patient tolerated treatment well Patient left: in bed;with call bell/phone within reach      Functional Assessment Tool Used: Clinical judgement Functional Limitation: Mobility: Walking and moving around Mobility: Walking and Moving Around Current Status [id number]): At least 20 percent but less than 40 percent impaired, limited or restricted Mobility: Walking and Moving Around Goal Status [phone]): At least 1 percent but less than 20 percent impaired, limited or restricted    Time: 1315-1340 PT Time Calculation (min) (ACUTE ONLY): 25 min   Charges:   PT Evaluation $PT Eval Moderate Complexity: 1 Procedure PT Treatments $Gait Training: 8-22 mins   PT G Codes:   PT G-Codes **NOT FOR INPATIENT CLASS** Functional Assessment Tool Used: Clinical judgement Functional Limitation: Mobility: Walking and moving around Mobility: Walking and Moving Around Current Status [id number]): At  least 20 percent but less than 40 percent impaired, limited or restricted Mobility: Walking and Moving Around Goal Status [phone]): At least 1 percent but less than 20 percent impaired, limited or restricted    Joe [name] 10/07/2016, 2:05 PM  Joe [name], [place] 10/07/2016

## 2016-10-07 NOTE — Care Management Note (Signed)
Case Management Note  Patient Details  Name: TAQUAN STAVROPOULOS MRN: [id number] Date of Birth: Mar 09, 1946  Subjective/Objective:    Pt in with polyneuropathy. He is from home alone but states he has a lot of assistance as needed. Pt has walker and wheelchair. 3 in 1 recommended but patient refusing.                 Action/Plan: PT/OT recommending HH services and 24 hour supervision. Pt states he will not go anywhere but home and is open to having [facility] services. CM following for d/c needs and MD orders.  Expected Discharge Date:  10/07/16               Expected Discharge Plan:  [facility]  In-House Referral:     Discharge planning Services     Post Acute Care Choice:    Choice offered to:     DME Arranged:    DME Agency:     HH Arranged:    HH Agency:     Status of Service:  In process, will continue to follow  If discussed at Long Length of Stay Meetings, dates discussed:    Additional Comments:  Pollie Friar, RN 10/07/2016, 3:55 PM

## 2016-10-07 NOTE — Progress Notes (Signed)
Patient refusing to comply with fall precautions. Patient requiring additional education for medication, treatment regimen, reasoning for IV access for MRI, etc.  Requesting to have MD further explain reasoning for all testing/medications and would like an "itemized report of everything I have received or has been ordered for me and by who."  Patient did agree after education for placement of IV (by IV team) so that MRI could be obtained.  Will continue to attempt education and monitor to patient.

## 2016-10-07 NOTE — Progress Notes (Signed)
*  PRELIMINARY RESULTS* Vascular Ultrasound Lower Extremity Arterial Duplex has been completed.  Preliminary findings: Mild to moderate atherosclerotic changes bilaterally. Triphasic waveforms seen in the common femoral artery through the popliteal artery.  The calf arteries of the right lower extremity; posterior tibial, peroneal, anterior tibial and dorsalas pedis arteries all demonstrate monophasic flow indicating arterial disease in the tibioperoneal trunk.  The left lower extremities all demonstrate monophasic flow, suggestive of aorta/iliac disease.  Everrett Coombe 10/07/2016, 3:54 PM

## 2016-10-07 NOTE — Evaluation (Signed)
Occupational Therapy Evaluation Patient Details Name: Joe [name] MRN: [id number] DOB: May 17, 1946 Today's Date: 10/07/2016    History of Present Illness Pt is a 70 y.o. male who presented to the ED with B hand numbness and B upper and lower body weakness with difficulty ambulating. MD concerned for Cuillain-Barre syndrome. Pt has a PMH significant for COPD, tobacco abuse, hypertension, hypogonadism, vitamin D deficiency, essential tremot, CAD s/p CABG, peripheral vascular disease, obesity, and hx of prior cervical surgery.   Clinical Impression   PTA, pt reports utilizing Rollator for functional mobility and does have an LPN who assists with IADL and home management tasks 6 days per week. Pt currently requires min-mod assist with ADL overall and min assist with toilet transfers. He presents with decreased sensation, coordination, and strength in B UE's impacting his independence with ADL. Pt would benefit from continued OT services while admitted to improve independence with ADL. Feel pt will need 24 hour assistance if able to go home and he reports family may be able to provide this. He is insistent on going home. Recommend home health OT and home health aide. OT will continue to follow acutely.    Follow Up Recommendations  Home health OT;Supervision/Assistance - 24 hour (home health aide; pt insists on going home rather than SNF)    Equipment Recommendations  3 in 1 bedside commode    Recommendations for Other Services       Precautions / Restrictions Precautions Precautions: Fall Restrictions Weight Bearing Restrictions: No      Mobility Bed Mobility               General bed mobility comments: OOB in chair on arrival.  Transfers Overall transfer level: Needs assistance Equipment used: Rolling walker (2 wheeled) Transfers: Sit to/from Stand Sit to Stand: Min assist              Balance Overall balance assessment: Needs assistance Sitting-balance support:  No upper extremity supported;Feet supported Sitting balance-Leahy Scale: Fair     Standing balance support: Bilateral upper extremity supported;No upper extremity supported;During functional activity Standing balance-Leahy Scale: Fair Standing balance comment: Able to stand at sink without UE support for grooming tasks but requires B UE support for dynamic activity.                            ADL Overall ADL's : Needs assistance/impaired Eating/Feeding: Minimal assistance;Sitting Eating/Feeding Details (indicate cue type and reason): Min assist to open containers. Grooming: Oral care;Standing;Moderate assistance Grooming Details (indicate cue type and reason): Min assist to maintain balance but mod assist to manage containers due to fine motor deficits. Upper Body Bathing: Set up;Supervision/ safety;Sitting   Lower Body Bathing: Moderate assistance;Sit to/from stand   Upper Body Dressing : Supervision/safety;Set up;Sitting   Lower Body Dressing: Moderate assistance;Sit to/from stand   Toilet Transfer: Minimal assistance;Ambulation;RW   Toileting- Clothing Manipulation and Hygiene: Moderate assistance;Sit to/from stand       Functional mobility during ADLs: Minimal assistance;Rolling walker General ADL Comments: Pt with decreased safety awareness throughout and slighlty impulsive.     Vision Vision Assessment?: No apparent visual deficits Additional Comments: Will continue to assess.   Perception     Praxis Praxis Praxis tested?: Within functional limits    Pertinent Vitals/Pain Pain Assessment: No/denies pain     Hand Dominance Right   Extremity/Trunk Assessment Upper Extremity Assessment Upper Extremity Assessment: Generalized weakness;RUE deficits/detail;LUE deficits/detail RUE Deficits / Details: Decreased AROM  shoulder to approximately 0-90 degrees. Elbow and grasp strength 4/5. RUE Sensation: decreased light touch;decreased proprioception RUE  Coordination: decreased fine motor;decreased gross motor LUE Deficits / Details: Decreased AROM shoulder to approximately 0-90 degrees. Elbow and grasp strength 4/5. LUE Sensation: decreased light touch;decreased proprioception LUE Coordination: decreased fine motor;decreased gross motor   Lower Extremity Assessment Lower Extremity Assessment: Defer to PT evaluation       Communication Communication Communication: No difficulties   Cognition Arousal/Alertness: Awake/alert Behavior During Therapy: WFL for tasks assessed/performed Overall Cognitive Status: No family/caregiver present to determine baseline cognitive functioning                 General Comments: Pt required increased time for processing and demonstrates decreased safety awareness.    General Comments       Exercises       Shoulder Instructions      Home Living Family/patient expects to be discharged to:: Private residence Living Arrangements: Alone Available Help at Discharge: Family;Available PRN/intermittently Type of Home: House             Bathroom Shower/Tub: Tub/shower unit Shower/tub characteristics: Curtain Biochemist, clinical: Standard     Home Equipment: Environmental consultant - 4 wheels          Prior Functioning/Environment Level of Independence: Independent with assistive device(s) (Uses Rollator)        Comments: Reports independence initially but later reporting LPN comes to assist with IADL and home management tasks.         OT Problem List: Decreased strength;Decreased range of motion;Decreased activity tolerance;Impaired balance (sitting and/or standing);Decreased coordination;Decreased cognition;Decreased knowledge of use of DME or AE;Decreased knowledge of precautions;Pain;Impaired UE functional use   OT Treatment/Interventions: Self-care/ADL training;Therapeutic exercise;Neuromuscular education;Energy conservation;DME and/or AE instruction;Therapeutic activities;Patient/family  education;Balance training;Cognitive remediation/compensation    OT Goals(Current goals can be found in the care plan section) Acute Rehab OT Goals Patient Stated Goal: to go home with his dog OT Goal Formulation: With patient Time For Goal Achievement: 10/14/16 Potential to Achieve Goals: Good ADL Goals Pt Will Perform Grooming: with modified independence;standing Pt Will Perform Lower Body Dressing: with modified independence;with adaptive equipment;sit to/from stand Pt Will Transfer to Toilet: with modified independence;ambulating;bedside commode (BSC over toilet) Pt Will Perform Toileting - Clothing Manipulation and hygiene: with modified independence;sit to/from stand Pt Will Perform Tub/Shower Transfer: with modified independence;Tub transfer;3 in 1;rolling walker;ambulating Additional ADL Goal #1: Pt will identifiy and incorporate 3 strategies to compensate for loss of sensation in B hands during cooking task in order to improve safety with ADL.  OT Frequency: Min 2X/week   Barriers to D/C:            Co-evaluation              End of Session Equipment Utilized During Treatment: Gait belt;Rolling walker  Activity Tolerance: Patient tolerated treatment well Patient left: in chair;with call bell/phone within reach;with chair alarm set   Time: [id number] OT Time Calculation (min): 24 min Charges:  OT General Charges $OT Visit: 1 Procedure OT Evaluation $OT Eval Moderate Complexity: 1 Procedure OT Treatments $Self Care/Home Management : 8-22 mins G-Codes: OT G-codes **NOT FOR INPATIENT CLASS** Functional Assessment Tool Used: clinical judgement Functional Limitation: Self care Self Care Current Status [id number]): At least 20 percent but less than 40 percent impaired, limited or restricted Self Care Goal Status [id number]): 0 percent impaired, limited or restricted  Norman Herrlich, OTR/L [phone] 10/07/2016, 11:35 AM

## 2016-10-07 NOTE — Care Management Obs Status (Signed)
[place] NOTIFICATION   Patient Details  Name: CHUKWUMA TORNES MRN: [id number] Date of Birth: 1946-03-18   Medicare Observation Status Notification Given:  Yes    Pollie Friar, RN 10/07/2016, 3:54 PM

## 2016-10-07 NOTE — Progress Notes (Signed)
PROGRESS NOTE    Joe [name]  [id number] DOB: 1945/11/22 DOA: 10/06/2016 PCP: Walker Kehr, MD Outpatient Specialists: Dr. Stanford Breed- Cardiology  Brief Narrative: [name] is a 71 year old male with a PMH of COPD, tobacco abuse, HTN, hypogonadism, CAD s/p CABG, PVD, obesity, and hx of cervical surgery presenting to the ED with bilateral hand numbness and tingling and progressive upper and lower extremity tinging and weakness over the last month. This has caused him to be unable to stand, walk, or take care of himself. He had a recent URI. He was seen by Neurology in the ED, which recommended Pt undergo LP to rule out atypical presentation of Guillain-Barre. CSF was unremarkable.  Assessment & Plan:   Principal Problem:   Polyneuropathy ([place]) Active Problems:   Hypogonadism in male   Vitamin D deficiency   Essential tremor   Essential hypertension   Coronary atherosclerosis   PVD (peripheral vascular disease) (HCC)   COPD mixed type (HCC)   Hyperlipidemia   Morbid obesity (HCC)   Urinary incontinence   Falls  Polyneuropathy: Pt having bilateral palmar hand numbness and upper and lower extremity tingling and weakness for the last month. Initial work-up including CK, LDH, CRP, ESR, B12, TSH, Vitamin D, anemia panel, UA, RPR, HIV, CMV, CT head/neck, LP were unremarkable except for a very mildly elevated ESR to 18 and low Vitamin D to 9.5. - EBV, RSV, Lyme disease antibodies pending - Follow-up CSF culture - Neurology recommending that patient be seen as an outpatient for possible nerve conduction study and muscle biopsy. Nothing else to do while hospitalized.  - PT/OT eval - Cannot obtain MRI, as Pt has a metal plate in his cervical spine. - Continue home Gabapentin, which was started by PCP 1 month ago.  PVD: - Lower extremity duplex US and ABIs pending  Urinary Incontinence: Has occurred twice. Unclear if this is related to his neurological symptoms. UA  negative.  Falls: Pt with many falls at home recently, related to his neurological symptoms. - PT/OT eval - Work-up as above  Hypogonadism: - Testosterone level pending  Vitamin D deficiency: Takes oral supplementation at home - Vitamin D level low   HTN: Currently normotensive - Continue home Norvasc, Lasix, Cozaar  COPD: Stable - Continue home Combivent  CAD: s/p CABG x 4. Not on statin or beta blocker. - Continue home Aspirin   DVT prophylaxis: Lovenox Code Status: FULL Family Communication: No family at bedside Disposition Plan: Dispo pending PT/OT eval.   Consultants:   Neurology  Procedures:   Lumbar puncture 2/13  Antimicrobials:   None   Subjective: Patient states he is doing fine this morning. He is still having numbness of his hands and feet that started a month ago when his heat was turned off. He feels like he is "walking on sponges" and this is causing him to fall.   Objective: Vitals:   10/06/16 2142 10/07/16 0118 10/07/16 0500 10/07/16 0527  BP: (!) 158/76 (!) 132/59  134/64  Pulse: 86 67  74  Resp: 18 18  18   Temp: 98.4 F (36.9 C) 98 F (36.7 C)  98.4 F (36.9 C)  TempSrc: Oral Oral  Oral  SpO2: 96% 97%  100%  Weight:   269 lb 3.2 oz (122.1 kg)   Height:        Intake/Output Summary (Last 24 hours) at 10/07/16 4163 Last data filed at 10/06/16 2200  Gross per 24 hour  Intake  0 ml  Output              200 ml  Net             -200 ml   Filed Weights   10/06/16 1134 10/07/16 0500  Weight: 284 lb (128.8 kg) 269 lb 3.2 oz (122.1 kg)    Examination:  General exam: Appears calm and comfortable  Respiratory system: Clear to auscultation. Respiratory effort normal. Cardiovascular system: S1 & S2 heard, RRR. No JVD, murmurs, rubs, gallops or clicks. No pedal edema. Gastrointestinal system: Abdomen is nondistended, soft and nontender. No organomegaly or masses felt. Normal bowel sounds heard. Central nervous system:  Alert and oriented. Decreased sensation in the palms of hands and soles of foot. Otherwise, sensation intact to light touch. Reflexes symmetric. Uncoordinated finger-to-nose test bilaterally.  Extremities: Symmetric 5/5 strength in upper and lower extremities. Skin: No rashes, lesions or ulcers Psychiatry: Judgement and insight appear normal. Mood & affect appropriate.     Data Reviewed: I have personally reviewed following labs and imaging studies  CBC:  Recent Labs Lab 10/06/16 1242 10/06/16 1317 10/07/16 0356  WBC 8.5  --  8.4  NEUTROABS 5.4  --   --   HGB 14.5 14.6 14.8  HCT 43.0 43.0 43.8  MCV 94.1  --  94.4  PLT 265  --  413   Basic Metabolic Panel:  Recent Labs Lab 10/06/16 1242 10/06/16 1317 10/07/16 0356  NA 137 140 138  K 3.8 3.7 4.1  CL 99* 99* 98*  CO2 26  --  27  GLUCOSE 97 95 94  BUN 11 12 9   CREATININE 0.98 0.80 0.98  CALCIUM 9.2  --  9.2   GFR: Estimated Creatinine Clearance: 93.2 mL/min (by C-G formula based on SCr of 0.98 mg/dL). Liver Function Tests:  Recent Labs Lab 10/06/16 1242 10/07/16 0356  AST 25 29  ALT 27 28  ALKPHOS 58 63  BILITOT 0.9 1.4*  PROT 6.7 6.9  ALBUMIN 3.8 3.8   No results for input(s): LIPASE, AMYLASE in the last 168 hours. No results for input(s): AMMONIA in the last 168 hours. Coagulation Profile: No results for input(s): INR, PROTIME in the last 168 hours. Cardiac Enzymes:  Recent Labs Lab 10/06/16 1612  CKTOTAL 187   BNP (last 3 results) No results for input(s): PROBNP in the last 8760 hours. HbA1C: No results for input(s): HGBA1C in the last 72 hours. CBG: No results for input(s): GLUCAP in the last 168 hours. Lipid Profile: No results for input(s): CHOL, HDL, LDLCALC, TRIG, CHOLHDL, LDLDIRECT in the last 72 hours. Thyroid Function Tests:  Recent Labs  10/06/16 1612  TSH 0.464   Anemia Panel:  Recent Labs  10/06/16 1612  VITAMINB12 254  FOLATE 12.1  FERRITIN 230  TIBC 374  IRON 121   RETICCTPCT 1.3   Urine analysis:    Component Value Date/Time   COLORURINE YELLOW 10/06/2016 1400   APPEARANCEUR CLEAR 10/06/2016 1400   LABSPEC 1.012 10/06/2016 1400   PHURINE 5.0 10/06/2016 1400   GLUCOSEU NEGATIVE 10/06/2016 1400   GLUCOSEU NEGATIVE 06/25/2015 0914   HGBUR NEGATIVE 10/06/2016 1400   BILIRUBINUR NEGATIVE 10/06/2016 1400   KETONESUR NEGATIVE 10/06/2016 1400   PROTEINUR NEGATIVE 10/06/2016 1400   UROBILINOGEN 1.0 06/25/2015 0914   NITRITE NEGATIVE 10/06/2016 1400   LEUKOCYTESUR NEGATIVE 10/06/2016 1400   Sepsis Labs: @LABRCNTIP (procalcitonin:4,lacticidven:4)  ) Recent Results (from the past 240 hour(s))  CSF culture     Status: None (Preliminary  result)   Collection Time: 10/06/16  4:15 PM  Result Value Ref Range Status   Specimen Description CSF  Final   Special Requests Normal  Final   Gram Stain   Final    WBC PRESENT,BOTH PMN AND MONONUCLEAR NO ORGANISMS SEEN CYTOSPIN    Culture PENDING  Incomplete   Report Status PENDING  Incomplete         Radiology Studies: Dg Chest 2 View  Result Date: 10/06/2016 CLINICAL DATA:  Neuropathy. EXAM: CHEST  2 VIEW COMPARISON:  09/07/2016 . FINDINGS: Prior CABG. Stable cardiomegaly. No pulmonary venous congestion. Low lung volumes with basilar atelectasis. No pleural effusion or pneumothorax. Prior cervical spine fusion . IMPRESSION: 1. Prior CABG.  Stable cardiomegaly. 2. Low lung volumes with bibasilar atelectasis . Electronically Signed   By: Marcello Moores  Register   On: 10/06/2016 12:19   Ct Head Wo Contrast  Result Date: 10/06/2016 CLINICAL DATA:  Numbness in fingers and bilateral leg weakness starting 09/07/2016 EXAM: CT HEAD WITHOUT CONTRAST CT CERVICAL SPINE WITHOUT CONTRAST TECHNIQUE: Multidetector CT imaging of the head and cervical spine was performed following the standard protocol without intravenous contrast. Multiplanar CT image reconstructions of the cervical spine were also generated. COMPARISON:   09/07/2016 and 06/08/2010 FINDINGS: CT HEAD FINDINGS Brain: No intracranial hemorrhage, mass effect or midline shift. Stable cerebral atrophy. Stable patchy subcortical chronic white matter disease. No definite acute cortical infarction. Ventricular size is stable from prior exam. No mass lesion is noted on this unenhanced scan. Stable mild periventricular chronic white matter disease. Vascular: Again noted atherosclerotic calcifications bilateral carotid siphon. Skull: No skull fracture is noted. Sinuses/Orbits: Mild mucosal thickening bilateral ethmoid air cells. No paranasal sinuses air-fluid levels. Other: None CT CERVICAL SPINE FINDINGS Alignment: Alignment is preserved. Skull base and vertebrae: No acute fracture or subluxation. Again noted anterior fusion with metallic plate and screws at X5-M8 level. There are degenerative changes C1-C2 articulation. Moderate anterior spurring upper endplate of C5 vertebral body. Mild anterior spurring lower endplate of C4 vertebral body. There is mild anterior and mild posterior spurring at C6-C7 level. Mild anterior spurring at C7-T1 level. Soft tissues and spinal canal: No prevertebral soft tissue swelling. Minimal spinal canal stenosis due to posterior spurring at C6-C7 level. Disc levels: Minimal disc space flattening at C4-C5 level. There is moderate disc space flattening at C6-C7 and C7-T1 level. Upper chest: Visualized lung apices shows no evidence of pneumothorax. Partially visualized previous sternotomy. Other: None IMPRESSION: 1. No acute intracranial abnormality. Stable cerebral atrophy and chronic white matter disease. No definite acute cortical infarction. 2. No cervical spine acute fracture or subluxation. Again noted anterior fusion with metallic plate and screws at U1-L2 level with alignment preserved. Multilevel mild degenerative changes as described above. No prevertebral soft tissue swelling. Electronically Signed   By: Lahoma Crocker M.D.   On: 10/06/2016  12:40   Ct Cervical Spine Wo Contrast  Result Date: 10/06/2016 CLINICAL DATA:  Numbness in fingers and bilateral leg weakness starting 09/07/2016 EXAM: CT HEAD WITHOUT CONTRAST CT CERVICAL SPINE WITHOUT CONTRAST TECHNIQUE: Multidetector CT imaging of the head and cervical spine was performed following the standard protocol without intravenous contrast. Multiplanar CT image reconstructions of the cervical spine were also generated. COMPARISON:  09/07/2016 and 06/08/2010 FINDINGS: CT HEAD FINDINGS Brain: No intracranial hemorrhage, mass effect or midline shift. Stable cerebral atrophy. Stable patchy subcortical chronic white matter disease. No definite acute cortical infarction. Ventricular size is stable from prior exam. No mass lesion is noted on this  unenhanced scan. Stable mild periventricular chronic white matter disease. Vascular: Again noted atherosclerotic calcifications bilateral carotid siphon. Skull: No skull fracture is noted. Sinuses/Orbits: Mild mucosal thickening bilateral ethmoid air cells. No paranasal sinuses air-fluid levels. Other: None CT CERVICAL SPINE FINDINGS Alignment: Alignment is preserved. Skull base and vertebrae: No acute fracture or subluxation. Again noted anterior fusion with metallic plate and screws at T5-V7 level. There are degenerative changes C1-C2 articulation. Moderate anterior spurring upper endplate of C5 vertebral body. Mild anterior spurring lower endplate of C4 vertebral body. There is mild anterior and mild posterior spurring at C6-C7 level. Mild anterior spurring at C7-T1 level. Soft tissues and spinal canal: No prevertebral soft tissue swelling. Minimal spinal canal stenosis due to posterior spurring at C6-C7 level. Disc levels: Minimal disc space flattening at C4-C5 level. There is moderate disc space flattening at C6-C7 and C7-T1 level. Upper chest: Visualized lung apices shows no evidence of pneumothorax. Partially visualized previous sternotomy. Other: None  IMPRESSION: 1. No acute intracranial abnormality. Stable cerebral atrophy and chronic white matter disease. No definite acute cortical infarction. 2. No cervical spine acute fracture or subluxation. Again noted anterior fusion with metallic plate and screws at O1-Y0 level with alignment preserved. Multilevel mild degenerative changes as described above. No prevertebral soft tissue swelling. Electronically Signed   By: Lahoma Crocker M.D.   On: 10/06/2016 12:40        Scheduled Meds: . amLODipine  5 mg Oral Daily  . aspirin EC  81 mg Oral Daily  . cholecalciferol  1,000 Units Oral Daily  . enoxaparin (LOVENOX) injection  60 mg Subcutaneous Q24H  . furosemide  20 mg Oral BID  . gabapentin  100 mg Oral TID  . losartan  100 mg Oral Daily  . pyridoxine  100 mg Oral Daily  . vitamin B-12  1,500 mcg Oral Daily   Continuous Infusions:   LOS: 0 days    Evette Doffing, MD Family Medicine Resident, PGY-2 Pager: [phone]  Attending: Dr. Eliseo Squires [facility] Pager # [phone]   If 7PM-7AM, please contact night-coverage [email or web address] Password [email or web address] 10/07/2016, 8:08 AM

## 2016-10-07 NOTE — Progress Notes (Signed)
*  PRELIMINARY RESULTS* Vascular Ultrasound Lower Extremity Arterial Doppler has been completed.  Preliminary findings: Ankle brachial indicies indicate moderate peripheral arterial disease bilaterally.  Joe [name] 10/07/2016, 2:33 PM

## 2016-10-07 NOTE — Progress Notes (Signed)
When giving the patient his meds the patient droppped one of his pills on the floor. Pills is white and round with no markings. Pharmacy called and suspect this is either Vitamin B6 or Vitamin B12. Since it can't be determined which pill this is, medication cannot be given. The patient is in agreement with this.

## 2016-10-08 ENCOUNTER — Observation Stay (HOSPITAL_COMMUNITY): Payer: Medicare Other

## 2016-10-08 DIAGNOSIS — R29898 Other symptoms and signs involving the musculoskeletal system: Secondary | ICD-10-CM | POA: Diagnosis not present

## 2016-10-08 DIAGNOSIS — R531 Weakness: Secondary | ICD-10-CM | POA: Diagnosis not present

## 2016-10-08 DIAGNOSIS — M4802 Spinal stenosis, cervical region: Secondary | ICD-10-CM | POA: Diagnosis not present

## 2016-10-08 DIAGNOSIS — G250 Essential tremor: Secondary | ICD-10-CM

## 2016-10-08 DIAGNOSIS — G629 Polyneuropathy, unspecified: Secondary | ICD-10-CM | POA: Diagnosis not present

## 2016-10-08 LAB — VAS US LOWER EXTREMITY ARTERIAL DUPLEX
LPERODISTDIA: -12 cm/s
LPERODISTSYS: -48 cm/s
LSFADISTDYSV: -39 cm/s
LSFAMIDVEL: -39 cm/s
LSFAPROXDYS: 30 cm/s
Left ant tibial distal sys: 71 cm/s
Left super femoral dist sys PSV: -272 cm/s
Left super femoral mid sys PSV: -192 cm/s
Left super femoral prox sys PSV: 208 cm/s
RATIBDISTSYS: 78 cm/s
RATIBMIDDIA: 9 cm/s
RATIBMIDSYS: 53 cm/s
RIGHT ANT DIST TIBAL DIA PSV: 12 cm/s
RIGHT POST TIB DIST DIA: -22 cm/s
RIGHT POST TIB MID DIA: 24 cm/s
RIGHT POST TIB MID SYS: 114 cm/s
RIGHT POST TIBIAL EDV: 16 cm/s
RIGHT SUPER FEMORAL DIST EDV: -13 cm/s
RPERMIN: -8 m/s
RPTIBPSV: 101 cm/s
RSFDPSV: -146 cm/s
RTIBDISTSYS: -109 cm/s
RTSFAMIDDIA: -17 cm/s
RTSFAPROXDIA: 16 cm/s
Right peroneal sys PSV: -31 cm/s
Right super femoral mid sys PSV: -164 cm/s
Right super femoral prox sys PSV: 215 cm/s
left ant tibial distal dia: 11 cm/s
left post tibial dist dia: 32 cm/s
left post tibial dist sys: 110 cm/s

## 2016-10-08 LAB — RSV(RESPIRATORY SYNCYTIAL VIRUS) AB, BLOOD: RSV AB: NEGATIVE

## 2016-10-08 LAB — GLUCOSE, CAPILLARY: Glucose-Capillary: 117 mg/dL — ABNORMAL HIGH (ref 65–99)

## 2016-10-08 MED ORDER — GADOBENATE DIMEGLUMINE 529 MG/ML IV SOLN
20.0000 mL | Freq: Once | INTRAVENOUS | Status: AC
Start: 2016-10-08 — End: 2016-10-08
  Administered 2016-10-08: 20 mL via INTRAVENOUS

## 2016-10-08 MED ORDER — VITAMIN D (ERGOCALCIFEROL) 1.25 MG (50000 UNIT) PO CAPS
50000.0000 [IU] | ORAL_CAPSULE | ORAL | Status: DC
  Administered 2016-10-08: 50000 [IU] via ORAL
  Filled 2016-10-08: qty 1

## 2016-10-08 NOTE — Progress Notes (Signed)
OT Cancellation Note  Patient Details Name: Joe [name] MRN: [id number] DOB: [birth year]   Cancelled Treatment:    Reason Eval/Treat Not Completed: Other (comment). In to see pt for therapy session, talked to him a few minutes and he reminded me that he has a rollator at home not a RW so I told him I would go get one, once I returned to the room he said that his is very similar, but he is not up for any exercise right now due to the long morning of testing and now back pain.  Almon Register [id number] 10/08/2016, 1:48 PM
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
## 2016-10-08 NOTE — Progress Notes (Signed)
PROGRESS NOTE    Joe [name]  [id number] DOB: 1945/12/08 DOA: 10/06/2016 PCP: Walker Kehr, MD    Brief Narrative: Joe [name] is a 71 year old male with a PMH of COPD, tobacco abuse, HTN, hypogonadism, CAD s/p CABG, PVD, obesity, and hx of cervical surgery presenting to the ED with bilateral hand numbness and tingling and progressive upper and lower extremity tinging and weakness over the last month. This has caused him to be unable to stand, walk, or take care of himself. He had a recent URI. He was seen by Neurology in the ED, which recommended Pt undergo LP to rule out atypical presentation of Guillain-Barre. CSF was unremarkable.    Assessment & Plan:   Principal Problem:   Polyneuropathy ([place]) Active Problems:   Hypogonadism in male   Vitamin D deficiency   Essential tremor   Essential hypertension   Coronary atherosclerosis   PVD (peripheral vascular disease) (HCC)   COPD mixed type (HCC)   Hyperlipidemia   Morbid obesity (HCC)   Urinary incontinence   Falls   Weakness of distal arms and legs   Polyneuropathy;  Pt having bilateral palmar hand numbness and upper and lower extremity tingling and weakness for the last month. Also urinary incontinence Initial work-up including CK, LDH, CRP, ESR, B12, TSH, Vitamin D, anemia panel, UA, RPR, HIV, CMV, CT head/neck, LP were unremarkable except for a very mildly elevated ESR to 18 and low Vitamin D to 9.5. B 12 low; getting supplement.  MRI brain negative. MRI C spine with C 3- 4 disc extrusion, cord compression.  Neurology consulted. Follow recommendation.  CSF ; no growth to date,  CMV IgM negative,Lyme negative,  HIV negative,  EBV IgG positive. Prior infection. Unclear if this would cause neuropathy.  Await formal neurology evaluation.    Hypogonadism: - Testosterone level pending  Vitamin D deficiency:  - Vitamin D level low  50,000 units once a weeks for 6 weeks. He will need further suplemments.   HTN:  Currently normotensive - Continue home Norvasc, Lasix, Cozaar  COPD: Stable - Continue home Combivent  CAD: s/p CABG x 4. Not on statin or beta blocker. - Continue home Aspirin  PVD: - Lower extremity duplex US and ABIs pending final report . , mild to moderate diseases. Needs to follow up with vascular.  -would not explain his symptoms.  DVT prophylaxis: lovenox Code Status: full code.  Family Communication: care discussed with friend.  Disposition Plan: await neurology evaluation    Consultants:   Neurology    Procedures:   LP   Antimicrobials: none   Subjective: He report tingling numbness hands and finger and feet.  Report pain knees and weakness when he walks.  Legs give out.  Report now back pain.   Objective: Vitals:   10/07/16 2110 10/08/16 0042 10/08/16 0608 10/08/16 1356  BP: (!) 109/93 130/65 136/89 131/60  Pulse: 67 72 76   Resp: 18 18 18 20   Temp:   98.1 F (36.7 C) 99 F (37.2 C)  TempSrc:   Oral Oral  SpO2: 95% 97% 98% 95%  Weight:      Height:        Intake/Output Summary (Last 24 hours) at 10/08/16 1502 Last data filed at 10/08/16 1400  Gross per 24 hour  Intake              600 ml  Output                0  ml  Net              600 ml   Filed Weights   10/06/16 1134 10/07/16 0500  Weight: 128.8 kg (284 lb) 122.1 kg (269 lb 3.2 oz)    Examination:  General exam: Appears calm and comfortable  Respiratory system: Clear to auscultation. Respiratory effort normal. Cardiovascular system: S1 & S2 heard, RRR. No JVD, murmurs, rubs, gallops or clicks. No pedal edema. Gastrointestinal system: Abdomen is nondistended, soft and nontender. No organomegaly or masses felt. Normal bowel sounds heard. Central nervous system: Alert and oriented. Upper extremity 5/5 lower 5/5 Skin: No rashes, lesions or ulcers     Data Reviewed: I have personally reviewed following labs and imaging studies  CBC:  Recent Labs Lab 10/06/16 1242  10/06/16 1317 10/07/16 0356  WBC 8.5  --  8.4  NEUTROABS 5.4  --   --   HGB 14.5 14.6 14.8  HCT 43.0 43.0 43.8  MCV 94.1  --  94.4  PLT 265  --  967   Basic Metabolic Panel:  Recent Labs Lab 10/06/16 1242 10/06/16 1317 10/07/16 0356  NA 137 140 138  K 3.8 3.7 4.1  CL 99* 99* 98*  CO2 26  --  27  GLUCOSE 97 95 94  BUN 11 12 9   CREATININE 0.98 0.80 0.98  CALCIUM 9.2  --  9.2   GFR: Estimated Creatinine Clearance: 93.2 mL/min (by C-G formula based on SCr of 0.98 mg/dL). Liver Function Tests:  Recent Labs Lab 10/06/16 1242 10/07/16 0356  AST 25 29  ALT 27 28  ALKPHOS 58 63  BILITOT 0.9 1.4*  PROT 6.7 6.9  ALBUMIN 3.8 3.8   No results for input(s): LIPASE, AMYLASE in the last 168 hours. No results for input(s): AMMONIA in the last 168 hours. Coagulation Profile: No results for input(s): INR, PROTIME in the last 168 hours. Cardiac Enzymes:  Recent Labs Lab 10/06/16 1612  CKTOTAL 187   BNP (last 3 results) No results for input(s): PROBNP in the last 8760 hours. HbA1C: No results for input(s): HGBA1C in the last 72 hours. CBG:  Recent Labs Lab 10/08/16 1220  GLUCAP 117*   Lipid Profile: No results for input(s): CHOL, HDL, LDLCALC, TRIG, CHOLHDL, LDLDIRECT in the last 72 hours. Thyroid Function Tests:  Recent Labs  10/06/16 1612  TSH 0.464   Anemia Panel:  Recent Labs  10/06/16 1612  VITAMINB12 254  FOLATE 12.1  FERRITIN 230  TIBC 374  IRON 121  RETICCTPCT 1.3   Sepsis Labs: No results for input(s): PROCALCITON, LATICACIDVEN in the last 168 hours.  Recent Results (from the past 240 hour(s))  CSF culture     Status: None (Preliminary result)   Collection Time: 10/06/16  4:15 PM  Result Value Ref Range Status   Specimen Description CSF  Final   Special Requests Normal  Final   Gram Stain   Final    WBC PRESENT,BOTH PMN AND MONONUCLEAR NO ORGANISMS SEEN CYTOSPIN    Culture NO GROWTH 2 DAYS  Final   Report Status PENDING   Incomplete         Radiology Studies: Mr Jeri Cos Wo Contrast  Result Date: 10/08/2016 CLINICAL DATA:  Weakness in all 4 extremities. EXAM: MRI HEAD WITHOUT AND WITH CONTRAST MRI CERVICAL SPINE WITHOUT AND WITH CONTRAST TECHNIQUE: Multiplanar, multiecho pulse sequences of the brain and surrounding structures, and cervical spine, to include the craniocervical junction and cervicothoracic junction, were obtained without and with  intravenous contrast. CONTRAST:  59m MULTIHANCE GADOBENATE DIMEGLUMINE 529 MG/ML IV SOLN COMPARISON:  CT of the head and cervical spine 10/06/2016. FINDINGS: MRI HEAD FINDINGS Brain: No evidence for acute infarction, hemorrhage, mass lesion, hydrocephalus, or extra-axial fluid. Moderate cerebral and cerebellar atrophy. Moderately advanced T2 and FLAIR hyperintensities throughout the white matter, representing chronic microvascular ischemic change. Chronic infarction RIGHT occipital lobe. Post infusion, no abnormal enhancement of the brain or meninges. Vascular: Flow voids are maintained throughout the carotid, basilar, and vertebral arteries. There are no areas of chronic hemorrhage. Skull and upper cervical spine: Normal marrow signal. Sinuses/Orbits: Negative. Other: None. MRI CERVICAL SPINE FINDINGS Alignment: Reversal of the normal cervical lordotic curve. 2 mm anterolisthesis C3-4. Vertebrae: Mild enhancement above and below the C3-4 interspace, greater in the C3 vertebral body, consistent with active degenerative change. Status post C5-6 ACDF with solid appearing arthrodesis. Cord: Severe cord compression at C3-4. Abnormal cord signal. No cord enhancement. Posterior Fossa, vertebral arteries, paraspinal tissues: No paraspinous abnormalities. Vertebral arteries patent, and equal. Disc levels: C2-3: Annular bulge. Facet arthropathy. BILATERAL C3 foraminal narrowing. Mild stenosis. C3-4: Central disc extrusion. 2 mm anterolisthesis is facet mediated. Severe stenosis with cord  compression and abnormal cord signal. Canal diameter 4-5 mm. Facet arthropathy. Bony overgrowth. LEFT greater than RIGHT C4 foraminal narrowing. C4-5: Unremarkable disc space. Facet arthropathy. Uncinate spurring. BILATERAL C5 foraminal narrowing. C5-6: Solid appearing ACDF. Uncinate spurring extends more to the LEFT. LEFT C6 foraminal narrowing. Slight effacement anterior subarachnoid space without cord flattening. C6-7: Advanced disc space narrowing representing adjacent segment disease. Disc osteophyte complex with BILATERAL uncinate spurring and facet arthropathy. No significant stenosis. BILATERAL C7 foraminal narrowing is observed. C7-T1:  Disc space narrowing.  Facet arthropathy.  No impingement. IMPRESSION: MRI BRAIN: Atrophy and small vessel disease. No acute intracranial findings. MRI CERVICAL SPINE: Severe spinal stenosis at the C3-4 is multifactorial, related to slip, posterior element hypertrophy, and central disc extrusion. Cord compression with abnormal cord signal is observed. Correlate clinically for symptomatic myelopathy. Multilevel spondylosis elsewhere could contribute to radicular symptoms, but no other areas of cord compression are seen. Electronically Signed   By: JStaci RighterM.D.   On: 10/08/2016 12:11   Mr Cervical Spine W Wo Contrast  Result Date: 10/08/2016 CLINICAL DATA:  Weakness in all 4 extremities. EXAM: MRI HEAD WITHOUT AND WITH CONTRAST MRI CERVICAL SPINE WITHOUT AND WITH CONTRAST TECHNIQUE: Multiplanar, multiecho pulse sequences of the brain and surrounding structures, and cervical spine, to include the craniocervical junction and cervicothoracic junction, were obtained without and with intravenous contrast. CONTRAST:  224mMULTIHANCE GADOBENATE DIMEGLUMINE 529 MG/ML IV SOLN COMPARISON:  CT of the head and cervical spine 10/06/2016. FINDINGS: MRI HEAD FINDINGS Brain: No evidence for acute infarction, hemorrhage, mass lesion, hydrocephalus, or extra-axial fluid. Moderate  cerebral and cerebellar atrophy. Moderately advanced T2 and FLAIR hyperintensities throughout the white matter, representing chronic microvascular ischemic change. Chronic infarction RIGHT occipital lobe. Post infusion, no abnormal enhancement of the brain or meninges. Vascular: Flow voids are maintained throughout the carotid, basilar, and vertebral arteries. There are no areas of chronic hemorrhage. Skull and upper cervical spine: Normal marrow signal. Sinuses/Orbits: Negative. Other: None. MRI CERVICAL SPINE FINDINGS Alignment: Reversal of the normal cervical lordotic curve. 2 mm anterolisthesis C3-4. Vertebrae: Mild enhancement above and below the C3-4 interspace, greater in the C3 vertebral body, consistent with active degenerative change. Status post C5-6 ACDF with solid appearing arthrodesis. Cord: Severe cord compression at C3-4. Abnormal cord signal. No cord enhancement. Posterior Fossa, vertebral  arteries, paraspinal tissues: No paraspinous abnormalities. Vertebral arteries patent, and equal. Disc levels: C2-3: Annular bulge. Facet arthropathy. BILATERAL C3 foraminal narrowing. Mild stenosis. C3-4: Central disc extrusion. 2 mm anterolisthesis is facet mediated. Severe stenosis with cord compression and abnormal cord signal. Canal diameter 4-5 mm. Facet arthropathy. Bony overgrowth. LEFT greater than RIGHT C4 foraminal narrowing. C4-5: Unremarkable disc space. Facet arthropathy. Uncinate spurring. BILATERAL C5 foraminal narrowing. C5-6: Solid appearing ACDF. Uncinate spurring extends more to the LEFT. LEFT C6 foraminal narrowing. Slight effacement anterior subarachnoid space without cord flattening. C6-7: Advanced disc space narrowing representing adjacent segment disease. Disc osteophyte complex with BILATERAL uncinate spurring and facet arthropathy. No significant stenosis. BILATERAL C7 foraminal narrowing is observed. C7-T1:  Disc space narrowing.  Facet arthropathy.  No impingement. IMPRESSION: MRI  BRAIN: Atrophy and small vessel disease. No acute intracranial findings. MRI CERVICAL SPINE: Severe spinal stenosis at the C3-4 is multifactorial, related to slip, posterior element hypertrophy, and central disc extrusion. Cord compression with abnormal cord signal is observed. Correlate clinically for symptomatic myelopathy. Multilevel spondylosis elsewhere could contribute to radicular symptoms, but no other areas of cord compression are seen. Electronically Signed   By: Staci Righter M.D.   On: 10/08/2016 12:11        Scheduled Meds: . amLODipine  5 mg Oral Daily  . aspirin EC  81 mg Oral Daily  . cholecalciferol  1,000 Units Oral Daily  . enoxaparin (LOVENOX) injection  60 mg Subcutaneous Q24H  . furosemide  20 mg Oral BID  . gabapentin  100 mg Oral TID  . losartan  100 mg Oral Daily  . pyridoxine  100 mg Oral Daily  . vitamin B-12  1,500 mcg Oral Daily   Continuous Infusions:   LOS: 0 days    Time spent: 35 minutes.     Elmarie Shiley, MD [facility] Pager [phone]  If 7PM-7AM, please contact night-coverage [email or web address] Password [email or web address] 10/08/2016, 3:02 PM

## 2016-10-08 NOTE — Progress Notes (Signed)
PT Cancellation Note  Patient Details Name: CHINEDUM RIGOR MRN: [id number] DOB: 08/03/1946   Cancelled Treatment:    Reason Eval/Treat Not Completed: Patient declined, no reason specified (Pt stated "after what I was put through this morning I'm not getting up to walk, maybe tomorrow.")   [facility] 10/08/2016, 3:50 PM [place], [place] Pager [phone]

## 2016-10-09 ENCOUNTER — Observation Stay (HOSPITAL_COMMUNITY): Payer: Medicare Other | Admitting: Anesthesiology

## 2016-10-09 ENCOUNTER — Encounter (HOSPITAL_COMMUNITY)
Admission: EM | Disposition: A | Discharge status: Home Health | Payer: Self-pay | Source: Home / Self Care | Attending: Internal Medicine

## 2016-10-09 ENCOUNTER — Observation Stay (HOSPITAL_COMMUNITY): Payer: Medicare Other

## 2016-10-09 ENCOUNTER — Encounter (HOSPITAL_COMMUNITY): Payer: Self-pay | Admitting: Anesthesiology

## 2016-10-09 DIAGNOSIS — R296 Repeated falls: Secondary | ICD-10-CM | POA: Diagnosis not present

## 2016-10-09 DIAGNOSIS — I739 Peripheral vascular disease, unspecified: Secondary | ICD-10-CM | POA: Diagnosis not present

## 2016-10-09 DIAGNOSIS — M5001 Cervical disc disorder with myelopathy,  high cervical region: Secondary | ICD-10-CM | POA: Diagnosis not present

## 2016-10-09 DIAGNOSIS — E559 Vitamin D deficiency, unspecified: Secondary | ICD-10-CM | POA: Diagnosis not present

## 2016-10-09 DIAGNOSIS — R627 Adult failure to thrive: Secondary | ICD-10-CM | POA: Diagnosis not present

## 2016-10-09 DIAGNOSIS — L570 Actinic keratosis: Secondary | ICD-10-CM | POA: Diagnosis not present

## 2016-10-09 DIAGNOSIS — J449 Chronic obstructive pulmonary disease, unspecified: Secondary | ICD-10-CM | POA: Diagnosis not present

## 2016-10-09 DIAGNOSIS — E291 Testicular hypofunction: Secondary | ICD-10-CM | POA: Diagnosis not present

## 2016-10-09 DIAGNOSIS — R531 Weakness: Secondary | ICD-10-CM | POA: Diagnosis not present

## 2016-10-09 DIAGNOSIS — I119 Hypertensive heart disease without heart failure: Secondary | ICD-10-CM | POA: Diagnosis not present

## 2016-10-09 DIAGNOSIS — M4322 Fusion of spine, cervical region: Secondary | ICD-10-CM | POA: Diagnosis not present

## 2016-10-09 DIAGNOSIS — E538 Deficiency of other specified B group vitamins: Secondary | ICD-10-CM | POA: Diagnosis not present

## 2016-10-09 DIAGNOSIS — E785 Hyperlipidemia, unspecified: Secondary | ICD-10-CM | POA: Diagnosis not present

## 2016-10-09 DIAGNOSIS — R251 Tremor, unspecified: Secondary | ICD-10-CM | POA: Diagnosis not present

## 2016-10-09 DIAGNOSIS — I2510 Atherosclerotic heart disease of native coronary artery without angina pectoris: Secondary | ICD-10-CM | POA: Diagnosis not present

## 2016-10-09 HISTORY — PX: ANTERIOR CERVICAL DECOMP/DISCECTOMY FUSION: SHX1161

## 2016-10-09 LAB — CSF CULTURE
CULTURE: NO GROWTH
SPECIAL REQUESTS: NORMAL

## 2016-10-09 LAB — BASIC METABOLIC PANEL
ANION GAP: 10 (ref 5–15)
BUN: 13 mg/dL (ref 6–20)
CHLORIDE: 99 mmol/L — AB (ref 101–111)
CO2: 27 mmol/L (ref 22–32)
Calcium: 9.4 mg/dL (ref 8.9–10.3)
Creatinine, Ser: 0.95 mg/dL (ref 0.61–1.24)
GFR calc Af Amer: >60 mL/min (ref >60)
Glucose, Bld: 125 mg/dL — ABNORMAL HIGH (ref 65–99)
POTASSIUM: 3.7 mmol/L (ref 3.5–5.1)
SODIUM: 136 mmol/L (ref 135–145)

## 2016-10-09 LAB — SURGICAL PCR SCREEN
MRSA, PCR: POSITIVE — AB
Staphylococcus aureus: POSITIVE — AB

## 2016-10-09 SURGERY — ANTERIOR CERVICAL DECOMPRESSION/DISCECTOMY FUSION 1 LEVEL
Anesthesia: General | Site: Neck

## 2016-10-09 MED ORDER — BUPIVACAINE HCL (PF) 0.25 % IJ SOLN
INTRAMUSCULAR | Status: AC
  Filled 2016-10-09: qty 30

## 2016-10-09 MED ORDER — HYDROCODONE-ACETAMINOPHEN 7.5-325 MG PO TABS
1.0000 | ORAL_TABLET | Freq: Four times a day (QID) | ORAL | Status: DC
  Filled 2016-10-09: qty 1

## 2016-10-09 MED ORDER — BISACODYL 10 MG RE SUPP: 10.0000 mg | Freq: Every day | RECTAL | Status: DC | PRN

## 2016-10-09 MED ORDER — SODIUM CHLORIDE 0.9% FLUSH: 3.0000 mL | INTRAVENOUS | Status: DC | PRN

## 2016-10-09 MED ORDER — EPHEDRINE SULFATE 50 MG/ML IJ SOLN
INTRAMUSCULAR | Status: DC | PRN
  Administered 2016-10-09 (×3): 10 mg via INTRAVENOUS

## 2016-10-09 MED ORDER — POLYETHYLENE GLYCOL 3350 17 G PO PACK: 17.0000 g | PACK | Freq: Every day | ORAL | Status: DC | PRN

## 2016-10-09 MED ORDER — PHENYLEPHRINE 40 MCG/ML (10ML) SYRINGE FOR IV PUSH (FOR BLOOD PRESSURE SUPPORT)
PREFILLED_SYRINGE | INTRAVENOUS | Status: AC
  Filled 2016-10-09: qty 10

## 2016-10-09 MED ORDER — FENTANYL CITRATE (PF) 100 MCG/2ML IJ SOLN
INTRAMUSCULAR | Status: DC | PRN
  Administered 2016-10-09: 100 ug via INTRAVENOUS

## 2016-10-09 MED ORDER — MIDAZOLAM HCL 2 MG/2ML IJ SOLN
INTRAMUSCULAR | Status: AC
  Filled 2016-10-09: qty 2

## 2016-10-09 MED ORDER — MUPIROCIN 2 % EX OINT
TOPICAL_OINTMENT | Freq: Two times a day (BID) | CUTANEOUS | Status: DC
  Administered 2016-10-09: 19:00:00 via NASAL
  Administered 2016-10-10: 1 via NASAL
  Administered 2016-10-11: 09:00:00 via NASAL

## 2016-10-09 MED ORDER — PHENYLEPHRINE HCL 10 MG/ML IJ SOLN
INTRAMUSCULAR | Status: DC | PRN
  Administered 2016-10-09: 160 ug via INTRAVENOUS
  Administered 2016-10-09 (×3): 80 ug via INTRAVENOUS

## 2016-10-09 MED ORDER — THROMBIN 5000 UNITS EX SOLR
CUTANEOUS | Status: DC | PRN
  Administered 2016-10-09 (×2): 5000 [IU] via TOPICAL

## 2016-10-09 MED ORDER — 0.9 % SODIUM CHLORIDE (POUR BTL) OPTIME
TOPICAL | Status: DC | PRN
  Administered 2016-10-09: 1000 mL

## 2016-10-09 MED ORDER — FLEET ENEMA 7-19 GM/118ML RE ENEM: 1.0000 | ENEMA | Freq: Once | RECTAL | Status: DC | PRN

## 2016-10-09 MED ORDER — DOCUSATE SODIUM 100 MG PO CAPS
100.0000 mg | ORAL_CAPSULE | Freq: Two times a day (BID) | ORAL | Status: DC
  Filled 2016-10-09 (×2): qty 1

## 2016-10-09 MED ORDER — ALUM & MAG HYDROXIDE-SIMETH 200-200-20 MG/5ML PO SUSP: 30.0000 mL | Freq: Four times a day (QID) | ORAL | Status: DC | PRN

## 2016-10-09 MED ORDER — SENNA 8.6 MG PO TABS
1.0000 | ORAL_TABLET | Freq: Two times a day (BID) | ORAL | Status: DC
  Filled 2016-10-09 (×2): qty 1

## 2016-10-09 MED ORDER — CEFAZOLIN SODIUM 1 G IJ SOLR
INTRAMUSCULAR | Status: AC
  Filled 2016-10-09: qty 20

## 2016-10-09 MED ORDER — THROMBIN 5000 UNITS EX SOLR
CUTANEOUS | Status: AC
  Filled 2016-10-09: qty 15000

## 2016-10-09 MED ORDER — PROPOFOL 10 MG/ML IV BOLUS
INTRAVENOUS | Status: DC | PRN
  Administered 2016-10-09: 200 mg via INTRAVENOUS

## 2016-10-09 MED ORDER — SODIUM CHLORIDE 0.9 % IV SOLN: 250.0000 mL | INTRAVENOUS | Status: DC

## 2016-10-09 MED ORDER — SODIUM CHLORIDE 0.9% FLUSH
3.0000 mL | Freq: Two times a day (BID) | INTRAVENOUS | Status: DC
  Administered 2016-10-10 – 2016-10-11 (×3): 3 mL via INTRAVENOUS

## 2016-10-09 MED ORDER — ROCURONIUM BROMIDE 100 MG/10ML IV SOLN
INTRAVENOUS | Status: DC | PRN
  Administered 2016-10-09: 50 mg via INTRAVENOUS

## 2016-10-09 MED ORDER — SUGAMMADEX SODIUM 500 MG/5ML IV SOLN
INTRAVENOUS | Status: DC | PRN
  Administered 2016-10-09: 244.2 mg via INTRAVENOUS

## 2016-10-09 MED ORDER — MENTHOL 3 MG MT LOZG: 1.0000 | LOZENGE | OROMUCOSAL | Status: DC | PRN

## 2016-10-09 MED ORDER — PROMETHAZINE HCL 25 MG/ML IJ SOLN: 6.2500 mg | INTRAMUSCULAR | Status: DC | PRN

## 2016-10-09 MED ORDER — ONDANSETRON HCL 4 MG/2ML IJ SOLN
INTRAMUSCULAR | Status: AC
  Filled 2016-10-09: qty 2

## 2016-10-09 MED ORDER — FENTANYL CITRATE (PF) 100 MCG/2ML IJ SOLN
INTRAMUSCULAR | Status: AC
  Filled 2016-10-09: qty 2

## 2016-10-09 MED ORDER — EPHEDRINE 5 MG/ML INJ
INTRAVENOUS | Status: AC
  Filled 2016-10-09: qty 10

## 2016-10-09 MED ORDER — LIDOCAINE 2% (20 MG/ML) 5 ML SYRINGE
INTRAMUSCULAR | Status: AC
  Filled 2016-10-09: qty 5

## 2016-10-09 MED ORDER — HYDROMORPHONE HCL 1 MG/ML IJ SOLN: 0.2500 mg | INTRAMUSCULAR | Status: DC | PRN

## 2016-10-09 MED ORDER — PHENOL 1.4 % MT LIQD
1.0000 | OROMUCOSAL | Status: DC | PRN
  Administered 2016-10-11: 1 via OROMUCOSAL
  Filled 2016-10-09: qty 177

## 2016-10-09 MED ORDER — LACTATED RINGERS IV SOLN
INTRAVENOUS | Status: DC | PRN
  Administered 2016-10-09: 20:00:00 via INTRAVENOUS

## 2016-10-09 MED ORDER — LIDOCAINE-EPINEPHRINE (PF) 2 %-1:200000 IJ SOLN
INTRAMUSCULAR | Status: DC | PRN
  Administered 2016-10-09: 3 mL via INTRADERMAL

## 2016-10-09 MED ORDER — SODIUM CHLORIDE 0.9 % IR SOLN
Status: DC | PRN
  Administered 2016-10-09: 500 mL

## 2016-10-09 MED ORDER — MIDAZOLAM HCL 5 MG/5ML IJ SOLN
INTRAMUSCULAR | Status: DC | PRN
  Administered 2016-10-09: 2 mg via INTRAVENOUS

## 2016-10-09 MED ORDER — SUGAMMADEX SODIUM 500 MG/5ML IV SOLN
INTRAVENOUS | Status: AC
  Filled 2016-10-09: qty 5

## 2016-10-09 MED ORDER — VITAMIN B-12 1000 MCG PO TABS
1500.0000 ug | ORAL_TABLET | Freq: Every day | ORAL | Status: DC
  Administered 2016-10-11: 1500 ug via ORAL
  Filled 2016-10-09 (×2): qty 2

## 2016-10-09 MED ORDER — ACETAMINOPHEN 650 MG RE SUPP: 650.0000 mg | RECTAL | Status: DC | PRN

## 2016-10-09 MED ORDER — GELATIN ABSORBABLE MT POWD
OROMUCOSAL | Status: DC | PRN
  Administered 2016-10-09: 5 mL

## 2016-10-09 MED ORDER — PHENYLEPHRINE HCL 10 MG/ML IJ SOLN
INTRAVENOUS | Status: DC | PRN
  Administered 2016-10-09: 50 ug/min via INTRAVENOUS

## 2016-10-09 MED ORDER — ACETAMINOPHEN 325 MG PO TABS: 650.0000 mg | ORAL_TABLET | Freq: Four times a day (QID) | ORAL | Status: DC | PRN

## 2016-10-09 MED ORDER — ACETAMINOPHEN 325 MG PO TABS: 650.0000 mg | ORAL_TABLET | ORAL | Status: DC | PRN

## 2016-10-09 MED ORDER — SUCCINYLCHOLINE CHLORIDE 200 MG/10ML IV SOSY
PREFILLED_SYRINGE | INTRAVENOUS | Status: AC
  Filled 2016-10-09: qty 10

## 2016-10-09 MED ORDER — DIAZEPAM 5 MG PO TABS
5.0000 mg | ORAL_TABLET | Freq: Four times a day (QID) | ORAL | Status: DC | PRN
  Filled 2016-10-09: qty 1

## 2016-10-09 MED ORDER — ACETAMINOPHEN 650 MG RE SUPP: 650.0000 mg | Freq: Four times a day (QID) | RECTAL | Status: DC | PRN

## 2016-10-09 MED ORDER — ROCURONIUM BROMIDE 50 MG/5ML IV SOSY
PREFILLED_SYRINGE | INTRAVENOUS | Status: AC
  Filled 2016-10-09: qty 5

## 2016-10-09 MED ORDER — ONDANSETRON HCL 4 MG/2ML IJ SOLN: 4.0000 mg | INTRAMUSCULAR | Status: DC | PRN

## 2016-10-09 MED ORDER — CEFAZOLIN SODIUM-DEXTROSE 2-3 GM-% IV SOLR
INTRAVENOUS | Status: DC | PRN
  Administered 2016-10-09: 2 g via INTRAVENOUS

## 2016-10-09 MED ORDER — SUCCINYLCHOLINE CHLORIDE 20 MG/ML IJ SOLN
INTRAMUSCULAR | Status: DC | PRN
  Administered 2016-10-09: 120 mg via INTRAVENOUS

## 2016-10-09 MED ORDER — BUPIVACAINE HCL (PF) 0.25 % IJ SOLN
INTRAMUSCULAR | Status: DC | PRN
  Administered 2016-10-09: 3 mL

## 2016-10-09 MED ORDER — LIDOCAINE-EPINEPHRINE (PF) 2 %-1:200000 IJ SOLN
INTRAMUSCULAR | Status: AC
  Filled 2016-10-09: qty 20

## 2016-10-09 MED ORDER — DEXAMETHASONE SODIUM PHOSPHATE 10 MG/ML IJ SOLN
INTRAMUSCULAR | Status: DC | PRN
  Administered 2016-10-09: 10 mg via INTRAVENOUS

## 2016-10-09 MED ORDER — ONDANSETRON HCL 4 MG/2ML IJ SOLN
INTRAMUSCULAR | Status: DC | PRN
  Administered 2016-10-09: 4 mg via INTRAVENOUS

## 2016-10-09 MED ORDER — MUPIROCIN 2 % EX OINT
TOPICAL_OINTMENT | CUTANEOUS | Status: AC
  Filled 2016-10-09: qty 22

## 2016-10-09 MED ORDER — CEFAZOLIN SODIUM-DEXTROSE 2-4 GM/100ML-% IV SOLN
2.0000 g | Freq: Three times a day (TID) | INTRAVENOUS | Status: AC
  Administered 2016-10-10 (×2): 2 g via INTRAVENOUS
  Filled 2016-10-09 (×2): qty 100

## 2016-10-09 MED ORDER — HYDROMORPHONE HCL 1 MG/ML IJ SOLN
INTRAMUSCULAR | Status: AC
  Filled 2016-10-09: qty 0.5

## 2016-10-09 MED ORDER — PROPOFOL 10 MG/ML IV BOLUS
INTRAVENOUS | Status: AC
  Filled 2016-10-09: qty 20

## 2016-10-09 MED ORDER — LACTATED RINGERS IV SOLN
INTRAVENOUS | Status: DC
  Administered 2016-10-09: 18:00:00 via INTRAVENOUS

## 2016-10-09 MED ORDER — LIDOCAINE HCL (CARDIAC) 20 MG/ML IV SOLN
INTRAVENOUS | Status: DC | PRN
  Administered 2016-10-09: 100 mg via INTRAVENOUS

## 2016-10-09 SURGICAL SUPPLY — 52 items
ADH SKN CLS APL DERMABOND .7 (GAUZE/BANDAGES/DRESSINGS) ×1
ALLOGRAFT CERV LORD 11X14X7 (Bone Implant) ×2 IMPLANT
BAG DECANTER FOR FLEXI CONT (MISCELLANEOUS) ×3 IMPLANT
BIT DRILL INVIZIA (BIT) IMPLANT
BIT DRILL NEURO 2X3.1 SFT TUCH (MISCELLANEOUS) ×1 IMPLANT
BNDG GAUZE ELAST 4 BULKY (GAUZE/BANDAGES/DRESSINGS) IMPLANT
BUR BARREL STRAIGHT FLUTE 4.0 (BURR) ×3 IMPLANT
CANISTER SUCT 3000ML PPV (MISCELLANEOUS) ×3 IMPLANT
CARTRIDGE OIL MAESTRO DRILL (MISCELLANEOUS) ×1 IMPLANT
DECANTER SPIKE VIAL GLASS SM (MISCELLANEOUS) ×3 IMPLANT
DERMABOND ADVANCED (GAUZE/BANDAGES/DRESSINGS) ×2
DERMABOND ADVANCED .7 DNX12 (GAUZE/BANDAGES/DRESSINGS) ×1 IMPLANT
DIFFUSER DRILL AIR PNEUMATIC (MISCELLANEOUS) ×1 IMPLANT
DRAPE HALF SHEET 40X57 (DRAPES) ×2 IMPLANT
DRAPE LAPAROTOMY 100X72 PEDS (DRAPES) ×3 IMPLANT
DRAPE MICROSCOPE LEICA (MISCELLANEOUS) IMPLANT
DRAPE POUCH INSTRU U-SHP 10X18 (DRAPES) ×3 IMPLANT
DRILL BIT INVIZIA (BIT) ×3
DRILL NEURO 2X3.1 SOFT TOUCH (MISCELLANEOUS) ×3
DURAPREP 6ML APPLICATOR 50/CS (WOUND CARE) ×3 IMPLANT
ELECT REM PT RETURN 9FT ADLT (ELECTROSURGICAL) ×3
ELECTRODE REM PT RTRN 9FT ADLT (ELECTROSURGICAL) ×1 IMPLANT
GAUZE SPONGE 4X4 16PLY XRAY LF (GAUZE/BANDAGES/DRESSINGS) IMPLANT
GLOVE BIOGEL M 6.5 STRL (GLOVE) ×4 IMPLANT
GLOVE BIOGEL M 7.0 STRL (GLOVE) ×2 IMPLANT
GLOVE BIOGEL PI IND STRL 8.5 (GLOVE) ×1 IMPLANT
GLOVE BIOGEL PI INDICATOR 8.5 (GLOVE) ×2
GLOVE ECLIPSE 8.5 STRL (GLOVE) ×3 IMPLANT
GLOVE INDICATOR 6.5 STRL GRN (GLOVE) ×2 IMPLANT
GOWN STRL REUS W/ TWL LRG LVL3 (GOWN DISPOSABLE) IMPLANT
GOWN STRL REUS W/ TWL XL LVL3 (GOWN DISPOSABLE) ×1 IMPLANT
GOWN STRL REUS W/TWL 2XL LVL3 (GOWN DISPOSABLE) ×3 IMPLANT
GOWN STRL REUS W/TWL LRG LVL3 (GOWN DISPOSABLE) ×6
GOWN STRL REUS W/TWL XL LVL3 (GOWN DISPOSABLE) ×3
HALTER HD/CHIN CERV TRACTION D (MISCELLANEOUS) ×3 IMPLANT
HEMOSTAT POWDER KIT SURGIFOAM (HEMOSTASIS) ×3 IMPLANT
KIT BASIN OR (CUSTOM PROCEDURE TRAY) ×3 IMPLANT
NDL SPNL 22GX3.5 QUINCKE BK (NEEDLE) ×1 IMPLANT
NEEDLE HYPO 22GX1.5 SAFETY (NEEDLE) ×3 IMPLANT
NEEDLE SPNL 22GX3.5 QUINCKE BK (NEEDLE) ×3 IMPLANT
NS IRRIG 1000ML POUR BTL (IV SOLUTION) ×3 IMPLANT
OIL CARTRIDGE MAESTRO DRILL (MISCELLANEOUS)
PACK LAMINECTOMY NEURO (CUSTOM PROCEDURE TRAY) ×3 IMPLANT
PAD ARMBOARD 7.5X6 YLW CONV (MISCELLANEOUS) ×9 IMPLANT
PLATE INVIZIA 1 LEV 24MM (Plate) ×2 IMPLANT
RUBBERBAND STERILE (MISCELLANEOUS) IMPLANT
SCREW SELF DRILL FIXED 16MM (Screw) ×8 IMPLANT
SPONGE INTESTINAL PEANUT (DISPOSABLE) ×3 IMPLANT
SUT VIC AB 3-0 SH 8-18 (SUTURE) ×3 IMPLANT
TOWEL OR 17X24 6PK STRL BLUE (TOWEL DISPOSABLE) ×3 IMPLANT
TOWEL OR 17X26 10 PK STRL BLUE (TOWEL DISPOSABLE) ×3 IMPLANT
WATER STERILE IRR 1000ML POUR (IV SOLUTION) ×3 IMPLANT

## 2016-10-09 NOTE — Progress Notes (Signed)
Occupational Therapy Treatment Patient Details Name: Joe [name] MRN: [id number] DOB: June 06, 1946 Today's Date: 10/09/2016    History of present illness Pt is a 71 y.o. male who presented to the ED with B hand numbness and B upper and lower body weakness with difficulty ambulating. MD concerned for Cuillain-Barre syndrome. Pt has a PMH significant for COPD, tobacco abuse, hypertension, hypogonadism, vitamin D deficiency, essential tremot, CAD s/p CABG, peripheral vascular disease, obesity, and hx of prior cervical surgery.   OT comments  This 71 yo male admitted with above presents to acute OT with continued balance deficits thus affecting his safety and independence with basic ADLs.[name] He will continue to benefit from acute OT with follow up [place] (due to pt currently refusing SNF therapies even though this would be the primary recommendation.   Follow Up Recommendations  Home health OT;Supervision/Assistance - 24 hour    Equipment Recommendations  3 in 1 bedside commode       Precautions / Restrictions Precautions Precautions: Fall Restrictions Weight Bearing Restrictions: No       Mobility Bed Mobility Overal bed mobility: Modified Independent                Transfers Overall transfer level: Needs assistance Equipment used: Rolling walker (2 wheeled) Transfers: Sit to/from Stand Sit to Stand: Min guard                  ADL Overall ADL's : Needs assistance/impaired                           Armed forces technical officer Details (indicate cue type and reason): min guard A to ambulate into bathroom and stand with RW over toilet to urinate Toileting- Clothing Manipulation and Hygiene: Min guard Toileting - Clothing Manipulation Details (indicate cue type and reason): standing to urinate                        Cognition   Behavior During Therapy: [facility] for tasks assessed/performed Overall Cognitive Status: Within Functional Limits for tasks assessed                                     Pertinent Vitals/ Pain       Pain Assessment: No/denies pain         Frequency  Min 2X/week        Progress Toward Goals  OT Goals(current goals can now be found in the care plan section)  Progress towards OT goals: Progressing toward goals     Plan Discharge plan remains appropriate       End of Session Equipment Utilized During Treatment: Rolling walker   Activity Tolerance Patient tolerated treatment well   Patient Left in bed;with call bell/phone within reach           Time: 1448-1459 OT Time Calculation (min): 11 min  Charges: OT General Charges $OT Visit: 1 Procedure OT Treatments $Self Care/Home Management : 8-22 mins  Almon Register [id number] 10/09/2016, 3:04 PM

## 2016-10-09 NOTE — Progress Notes (Signed)
PROGRESS NOTE    Joe BURROUGHS  QIH:[id number] DOB: 1946/02/25 DOA: 10/06/2016 PCP: Walker Kehr, MD    Brief Narrative: Joe [name] is a 71 year old male with a PMH of COPD, tobacco abuse, HTN, hypogonadism, CAD s/p CABG, PVD, obesity, and hx of cervical surgery presenting to the ED with bilateral hand numbness and tingling and progressive upper and lower extremity tinging and weakness over the last month. This has caused him to be unable to stand, walk, or take care of himself. He had a recent URI. He was seen by Neurology in the ED, which recommended Pt undergo LP to rule out atypical presentation of Guillain-Barre. CSF was unremarkable.    Assessment & Plan:   Principal Problem:   Polyneuropathy ([place]) Active Problems:   Hypogonadism in male   Vitamin D deficiency   Essential tremor   Essential hypertension   Coronary atherosclerosis   PVD (peripheral vascular disease) (HCC)   COPD mixed type (HCC)   Hyperlipidemia   Morbid obesity (HCC)   Urinary incontinence   Falls   Weakness of distal arms and legs   Polyneuropathy;  Pt having bilateral palmar hand numbness and upper and lower extremity tingling and weakness for the last month. Also urinary incontinence Initial work-up including CK, LDH, CRP, ESR, B12, TSH, Vitamin D, anemia panel, UA, RPR, HIV, CMV, CT head/neck, LP were unremarkable except for a very mildly elevated ESR to 18 and low Vitamin D to 9.5. B 12 low; getting supplement.  MRI brain negative. MRI C spine with C 3- 4 disc extrusion, cord compression.  Neurology consulted. Follow recommendation.  CSF ; no growth to date,  CMV IgM negative,Lyme negative,  HIV negative,  EBV IgG positive. Prior infection. Unclear if this would cause neuropathy.  Neurosurgery consulted. Plan for cord decompression today. Appreciate Dr Ellene Route evaluation.   Hypogonadism: - Testosterone level low.   Vitamin D deficiency:  - Vitamin D level low  50,000 units once a weeks for  6 weeks. He will need further suplemments.   HTN: Currently normotensive - Continue home Norvasc, Lasix, Cozaar  COPD: Stable - Continue home Combivent  CAD: s/p CABG x 4. Not on statin or beta blocker. - Continue home Aspirin  PVD: - Lower extremity duplex US and ABIs pending final report . , mild to moderate diseases. Discussed results with Dr Scot Dock, no further vascular evaluation needed.  -would not explain his symptoms.  DVT prophylaxis: lovenox Code Status: full code.  Family Communication: care discussed with patient.  Disposition Plan: Sx today    Consultants:   Neurology    Procedures:   LP   Antimicrobials: none   Subjective: Feels worsening weakness Upper extremities.   Objective: Vitals:   10/08/16 2100 10/09/16 0102 10/09/16 0620 10/09/16 0800  BP: 139/63 (!) 108/47 (!) 146/60 125/69  Pulse: 66 64 83 84  Resp:  18 18 18   Temp: 98.6 F (37 C) 98.4 F (36.9 C) 98.6 F (37 C) 98.4 F (36.9 C)  TempSrc: Oral Oral Oral Oral  SpO2: 96% 95% 98% 99%  Weight:      Height:        Intake/Output Summary (Last 24 hours) at 10/09/16 1348 Last data filed at 10/08/16 1754  Gross per 24 hour  Intake              480 ml  Output                0 ml  Net  480 ml   Filed Weights   10/06/16 1134 10/07/16 0500  Weight: 128.8 kg (284 lb) 122.1 kg (269 lb 3.2 oz)    Examination:  General exam: Appears calm and comfortable  Respiratory system: Clear to auscultation. Respiratory effort normal. Cardiovascular system: S1 & S2 heard, RRR. No JVD, murmurs, rubs, gallops or clicks. No pedal edema. Gastrointestinal system: Abdomen is nondistended, soft and nontender. No organomegaly or masses felt. Normal bowel sounds heard. Central nervous system: Alert and oriented. Difficulty raising upper extremity above the head bilaterally   Skin: No rashes, lesions or ulcers     Data Reviewed: I have personally reviewed following labs and imaging  studies  CBC:  Recent Labs Lab 10/06/16 1242 10/06/16 1317 10/07/16 0356  WBC 8.5  --  8.4  NEUTROABS 5.4  --   --   HGB 14.5 14.6 14.8  HCT 43.0 43.0 43.8  MCV 94.1  --  94.4  PLT 265  --  865   Basic Metabolic Panel:  Recent Labs Lab 10/06/16 1242 10/06/16 1317 10/07/16 0356  NA 137 140 138  K 3.8 3.7 4.1  CL 99* 99* 98*  CO2 26  --  27  GLUCOSE 97 95 94  BUN 11 12 9   CREATININE 0.98 0.80 0.98  CALCIUM 9.2  --  9.2   GFR: Estimated Creatinine Clearance: 93.2 mL/min (by C-G formula based on SCr of 0.98 mg/dL). Liver Function Tests:  Recent Labs Lab 10/06/16 1242 10/07/16 0356  AST 25 29  ALT 27 28  ALKPHOS 58 63  BILITOT 0.9 1.4*  PROT 6.7 6.9  ALBUMIN 3.8 3.8   No results for input(s): LIPASE, AMYLASE in the last 168 hours. No results for input(s): AMMONIA in the last 168 hours. Coagulation Profile: No results for input(s): INR, PROTIME in the last 168 hours. Cardiac Enzymes:  Recent Labs Lab 10/06/16 1612  CKTOTAL 187   BNP (last 3 results) No results for input(s): PROBNP in the last 8760 hours. HbA1C: No results for input(s): HGBA1C in the last 72 hours. CBG:  Recent Labs Lab 10/08/16 1220  GLUCAP 117*   Lipid Profile: No results for input(s): CHOL, HDL, LDLCALC, TRIG, CHOLHDL, LDLDIRECT in the last 72 hours. Thyroid Function Tests:  Recent Labs  10/06/16 1612  TSH 0.464   Anemia Panel:  Recent Labs  10/06/16 1612  VITAMINB12 254  FOLATE 12.1  FERRITIN 230  TIBC 374  IRON 121  RETICCTPCT 1.3   Sepsis Labs: No results for input(s): PROCALCITON, LATICACIDVEN in the last 168 hours.  Recent Results (from the past 240 hour(s))  CSF culture     Status: None   Collection Time: 10/06/16  4:15 PM  Result Value Ref Range Status   Specimen Description CSF  Final   Special Requests Normal  Final   Gram Stain   Final    WBC PRESENT,BOTH PMN AND MONONUCLEAR NO ORGANISMS SEEN CYTOSPIN    Culture NO GROWTH 3 DAYS  Final    Report Status 10/09/2016 FINAL  Final         Radiology Studies: Mr Jeri [facility] Contrast  Result Date: 10/08/2016 CLINICAL DATA:  Weakness in all 4 extremities. EXAM: MRI HEAD WITHOUT AND WITH CONTRAST MRI CERVICAL SPINE WITHOUT AND WITH CONTRAST TECHNIQUE: Multiplanar, multiecho pulse sequences of the brain and surrounding structures, and cervical spine, to include the craniocervical junction and cervicothoracic junction, were obtained without and with intravenous contrast. CONTRAST:  44m MULTIHANCE GADOBENATE DIMEGLUMINE 529 MG/ML IV SOLN  COMPARISON:  CT of the head and cervical spine 10/06/2016. FINDINGS: MRI HEAD FINDINGS Brain: No evidence for acute infarction, hemorrhage, mass lesion, hydrocephalus, or extra-axial fluid. Moderate cerebral and cerebellar atrophy. Moderately advanced T2 and FLAIR hyperintensities throughout the white matter, representing chronic microvascular ischemic change. Chronic infarction RIGHT occipital lobe. Post infusion, no abnormal enhancement of the brain or meninges. Vascular: Flow voids are maintained throughout the carotid, basilar, and vertebral arteries. There are no areas of chronic hemorrhage. Skull and upper cervical spine: Normal marrow signal. Sinuses/Orbits: Negative. Other: None. MRI CERVICAL SPINE FINDINGS Alignment: Reversal of the normal cervical lordotic curve. 2 mm anterolisthesis C3-4. Vertebrae: Mild enhancement above and below the C3-4 interspace, greater in the C3 vertebral body, consistent with active degenerative change. Status post C5-6 ACDF with solid appearing arthrodesis. Cord: Severe cord compression at C3-4. Abnormal cord signal. No cord enhancement. Posterior Fossa, vertebral arteries, paraspinal tissues: No paraspinous abnormalities. Vertebral arteries patent, and equal. Disc levels: C2-3: Annular bulge. Facet arthropathy. BILATERAL C3 foraminal narrowing. Mild stenosis. C3-4: Central disc extrusion. 2 mm anterolisthesis is facet mediated.  Severe stenosis with cord compression and abnormal cord signal. Canal diameter 4-5 mm. Facet arthropathy. Bony overgrowth. LEFT greater than RIGHT C4 foraminal narrowing. C4-5: Unremarkable disc space. Facet arthropathy. Uncinate spurring. BILATERAL C5 foraminal narrowing. C5-6: Solid appearing ACDF. Uncinate spurring extends more to the LEFT. LEFT C6 foraminal narrowing. Slight effacement anterior subarachnoid space without cord flattening. C6-7: Advanced disc space narrowing representing adjacent segment disease. Disc osteophyte complex with BILATERAL uncinate spurring and facet arthropathy. No significant stenosis. BILATERAL C7 foraminal narrowing is observed. C7-T1:  Disc space narrowing.  Facet arthropathy.  No impingement. IMPRESSION: MRI BRAIN: Atrophy and small vessel disease. No acute intracranial findings. MRI CERVICAL SPINE: Severe spinal stenosis at the C3-4 is multifactorial, related to slip, posterior element hypertrophy, and central disc extrusion. Cord compression with abnormal cord signal is observed. Correlate clinically for symptomatic myelopathy. Multilevel spondylosis elsewhere could contribute to radicular symptoms, but no other areas of cord compression are seen. Electronically Signed   By: Staci Righter M.D.   On: 10/08/2016 12:11   Mr Cervical Spine W Wo Contrast  Result Date: 10/08/2016 CLINICAL DATA:  Weakness in all 4 extremities. EXAM: MRI HEAD WITHOUT AND WITH CONTRAST MRI CERVICAL SPINE WITHOUT AND WITH CONTRAST TECHNIQUE: Multiplanar, multiecho pulse sequences of the brain and surrounding structures, and cervical spine, to include the craniocervical junction and cervicothoracic junction, were obtained without and with intravenous contrast. CONTRAST:  35m MULTIHANCE GADOBENATE DIMEGLUMINE 529 MG/ML IV SOLN COMPARISON:  CT of the head and cervical spine 10/06/2016. FINDINGS: MRI HEAD FINDINGS Brain: No evidence for acute infarction, hemorrhage, mass lesion, hydrocephalus, or  extra-axial fluid. Moderate cerebral and cerebellar atrophy. Moderately advanced T2 and FLAIR hyperintensities throughout the white matter, representing chronic microvascular ischemic change. Chronic infarction RIGHT occipital lobe. Post infusion, no abnormal enhancement of the brain or meninges. Vascular: Flow voids are maintained throughout the carotid, basilar, and vertebral arteries. There are no areas of chronic hemorrhage. Skull and upper cervical spine: Normal marrow signal. Sinuses/Orbits: Negative. Other: None. MRI CERVICAL SPINE FINDINGS Alignment: Reversal of the normal cervical lordotic curve. 2 mm anterolisthesis C3-4. Vertebrae: Mild enhancement above and below the C3-4 interspace, greater in the C3 vertebral body, consistent with active degenerative change. Status post C5-6 ACDF with solid appearing arthrodesis. Cord: Severe cord compression at C3-4. Abnormal cord signal. No cord enhancement. Posterior Fossa, vertebral arteries, paraspinal tissues: No paraspinous abnormalities. Vertebral arteries patent, and equal. Disc  levels: C2-3: Annular bulge. Facet arthropathy. BILATERAL C3 foraminal narrowing. Mild stenosis. C3-4: Central disc extrusion. 2 mm anterolisthesis is facet mediated. Severe stenosis with cord compression and abnormal cord signal. Canal diameter 4-5 mm. Facet arthropathy. Bony overgrowth. LEFT greater than RIGHT C4 foraminal narrowing. C4-5: Unremarkable disc space. Facet arthropathy. Uncinate spurring. BILATERAL C5 foraminal narrowing. C5-6: Solid appearing ACDF. Uncinate spurring extends more to the LEFT. LEFT C6 foraminal narrowing. Slight effacement anterior subarachnoid space without cord flattening. C6-7: Advanced disc space narrowing representing adjacent segment disease. Disc osteophyte complex with BILATERAL uncinate spurring and facet arthropathy. No significant stenosis. BILATERAL C7 foraminal narrowing is observed. C7-T1:  Disc space narrowing.  Facet arthropathy.  No  impingement. IMPRESSION: MRI BRAIN: Atrophy and small vessel disease. No acute intracranial findings. MRI CERVICAL SPINE: Severe spinal stenosis at the C3-4 is multifactorial, related to slip, posterior element hypertrophy, and central disc extrusion. Cord compression with abnormal cord signal is observed. Correlate clinically for symptomatic myelopathy. Multilevel spondylosis elsewhere could contribute to radicular symptoms, but no other areas of cord compression are seen. Electronically Signed   By: Staci Righter M.D.   On: 10/08/2016 12:11        Scheduled Meds: . amLODipine  5 mg Oral Daily  . aspirin EC  81 mg Oral Daily  . enoxaparin (LOVENOX) injection  60 mg Subcutaneous Q24H  . furosemide  20 mg Oral BID  . gabapentin  100 mg Oral TID  . losartan  100 mg Oral Daily  . pyridoxine  100 mg Oral Daily  . vitamin B-12  1,500 mcg Oral Daily  . Vitamin D (Ergocalciferol)  50,000 Units Oral Q7 days   Continuous Infusions:   LOS: 0 days    Time spent: 35 minutes.     Elmarie Shiley, MD [facility] Pager [phone]  If 7PM-7AM, please contact night-coverage [email or web address] Password [email or web address] 10/09/2016, 1:48 PM

## 2016-10-09 NOTE — Op Note (Signed)
Date of surgery: 10/09/2016 Preoperative diagnosis: Cervical myelopathy C3-C4 with quadruped cyst Postoperative diagnosis: Same Procedure: Anterior cervical decompression C3-C4 arthrodesis with structural allograft and anterior plate fixation [id number]. Surgeon: Kristeen Miss Anesthesia: Gen. endotracheal Indications: Joe [name] is a 71 year old individual who's had significant weakness develop over the past months time in all 4 extremities. He has difficulty walking. An MRI demonstrates presence of spinal cord compression with intrinsic changes in the cord at C3-C4. He's been advised regarding the need for surgery.  Procedure: The patient was brought to the operating room placed on the table in supine position. After the smooth induction of general endotracheal anesthesia, he was placed in 5 pounds of halter traction. Neck was prepped with alcohol DuraPrep and draped in a sterile fashion. A transverse incision was created in the neck and carried down to the platysma. The plane between the sternocleidomastoid and strap muscles dissected bluntly until the prevertebral space was reached. First identifiable disc space was noted to be that of C2-C3 on a localizing radiograph. Dissection was carried down to C3-C4. The disc space was isolated. Longus coli muscle was stripped off either side of midline and a self-retaining Caspar type retractor was placed under this muscle. Then the disc space was entered with a 15 blade, a combination of curettes and rongeurs were used to evacuate a substantial quantity of severely degenerated and desiccated disc material. As region of the posterior longitudinal ligament was reached was noted be significant osteophytes. These were drilled down with 2 mm drill bit. This allowed entry under the posterior longitudinal ligament and this was lifted using a single millimeter Kerrison punch. Significant quantities of degenerated disc and bony osteophyte were removed. Dissection was  carried out laterally into the region of the foramen. The foramen was decompressed. This was done bilaterally. Once the decompression was completed the endplates were shaved smooth. Interspace was sized for appropriate size spacer, it is felt that a 7 mm spacer would be best. This was placed into the interspace. The spacer was a lordotic spacer. A 24 mm standard size in physeal plate was fitted to the ventral aspect the vertebral body with 4 locking 74mm screws. Hemostasis in the soft tissues checked carefully and localizing radiograph identified good position of the surgical construct. Once hemostasis was well achieved, the retractors were removed and then the platysma was closed with 3-0 Vicryl interrupted fashion and 3-0 Vicryl was used to close the subcutaneous take her tissues Dermabond was used on the skin. Patient tolerated procedure well blood loss was estimated at less than 50 mL.
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
## 2016-10-09 NOTE — Transfer of Care (Signed)
Immediate Anesthesia Transfer of Care Note  Patient: Joe [name]  Procedure(s) Performed: Procedure(s): ANTERIOR CERVICAL DECOMPRESSION/DISCECTOMY FUSION, Cervical three - four (N/A)  Patient Location: PACU  Anesthesia Type:General  Level of Consciousness: awake  Airway & Oxygen Therapy: Patient Spontanous Breathing  Post-op Assessment: Report given to RN and Post -op Vital signs reviewed and stable  Post vital signs: Reviewed and stable  Last Vitals:  Vitals:   10/09/16 0800 10/09/16 1420  BP: 125/69 130/70  Pulse: 84 80  Resp: 18 18  Temp: 36.9 C 36.7 C    Last Pain:  Vitals:   10/09/16 1420  TempSrc: Oral  PainSc:          Complications: No apparent anesthesia complications

## 2016-10-09 NOTE — Progress Notes (Addendum)
Pt returned from PACU, vitals stable. Incision CDI Patient spitting up phlegm, unable to swallow small sips of water, unable to swallow pills.  Call made to on call [phone] to obtain orders for other pain medication, awaiting return call. Orders placed for dilaudid 1-2 mg q3h prn 0033. Continue to monitor patient.

## 2016-10-09 NOTE — Progress Notes (Signed)
Pt transported off unit to OR for procedure. PCR sent, report called off to short stay RN. Pt transported off unit via bed to OR. Francis Gaines Tawanna Funk RN.

## 2016-10-09 NOTE — Anesthesia Procedure Notes (Signed)
Procedure Name: Intubation Date/Time: 10/09/2016 8:17 PM Performed by: Manus Gunning, Neasia Fleeman J Pre-anesthesia Checklist: Patient identified, Emergency Drugs available, Suction available, Timeout performed and Patient being monitored Patient Re-evaluated:Patient Re-evaluated prior to inductionOxygen Delivery Method: Circle system utilized Preoxygenation: Pre-oxygenation with 100% oxygen Intubation Type: IV induction Ventilation: Mask ventilation without difficulty Laryngoscope Size: Glidescope Grade View: Grade I Tube type: Oral Tube size: 7.5 mm Number of attempts: 1 Placement Confirmation: positive ETCO2,  breath sounds checked- equal and bilateral and ETT inserted through vocal cords under direct vision Secured at: 22 cm Tube secured with: Tape Dental Injury: Teeth and Oropharynx as per pre-operative assessment

## 2016-10-09 NOTE — Progress Notes (Signed)
Physical Therapy Treatment Patient Details Name: Joe [name] MRN: [id number] DOB: December 13, 1945 Today's Date: 10/09/2016    History of Present Illness Pt is a 71 y.o. male who presented to the ED with B hand numbness and B upper and lower body weakness with difficulty ambulating. MD concerned for Cuillain-Barre syndrome. Pt has a PMH significant for COPD, tobacco abuse, hypertension, hypogonadism, vitamin D deficiency, essential tremot, CAD s/p CABG, peripheral vascular disease, obesity, and hx of prior cervical surgery.    PT Comments    Pt willing to attempt ambulation today. Able to ambulate 80 ft with rollator, slow pattern with mild instability but no loss of balance (taking 3 standing breaks). Pt confirms that he does have a rollator at home. PT to continue to follow to progress mobility and safety in anticipation of D/C to home.   Follow Up Recommendations  Home health PT     Equipment Recommendations  None recommended by PT    Recommendations for Other Services       Precautions / Restrictions Precautions Precautions: Fall Restrictions Weight Bearing Restrictions: No    Mobility  Bed Mobility               General bed mobility comments: Pt sitting EOB upon arrival  Transfers Overall transfer level: Needs assistance Equipment used: None Transfers: Sit to/from Stand Sit to Stand: Min guard         General transfer comment: guard for safety  Ambulation/Gait Ambulation/Gait assistance: Min guard Ambulation Distance (Feet): 80 Feet Assistive device: 4-wheeled walker Gait Pattern/deviations: Step-through pattern Gait velocity: decreased   General Gait Details: Decreased cadence and taking 3 standing breaks.    Stairs            Wheelchair Mobility    Modified Rankin (Stroke Patients Only)       Balance Overall balance assessment: Needs assistance Sitting-balance support: Feet supported Sitting balance-Leahy Scale: Good     Standing  balance support: During functional activity Standing balance-Leahy Scale: Fair Standing balance comment: using rollator during ambulation                    Cognition Arousal/Alertness: Awake/alert Behavior During Therapy: WFL for tasks assessed/performed Overall Cognitive Status: Within Functional Limits for tasks assessed                      Exercises      General Comments        Pertinent Vitals/Pain Pain Assessment: No/denies pain Pain Location: hands Pain Descriptors / Indicators: Numbness;Tingling Pain Intervention(s): Limited activity within patient's tolerance;Monitored during session    Home Living                      Prior Function            PT Goals (current goals can now be found in the care plan section) Acute Rehab PT Goals Patient Stated Goal: figure out what's going on.  PT Goal Formulation: With patient Time For Goal Achievement: 10/14/16 Potential to Achieve Goals: Good Progress towards PT goals: Progressing toward goals    Frequency    Min 3X/week      PT Plan Current plan remains appropriate    Co-evaluation             End of Session Equipment Utilized During Treatment: Gait belt Activity Tolerance: Patient tolerated treatment well Patient left: in chair;with call bell/phone within reach     Time: [phone]  PT Time Calculation (min) (ACUTE ONLY): 17 min  Charges:  $Gait Training: 8-22 mins                    G Codes:      Cassell Clement, PT, CSCS Pager [phone] Office [phone]  10/09/2016, 10:18 AM

## 2016-10-09 NOTE — Anesthesia Preprocedure Evaluation (Addendum)
Anesthesia Evaluation  Patient identified by MRN, date of birth, ID band Patient awake    Reviewed: Allergy & Precautions, NPO status , Patient's Chart, lab work & pertinent test results  Airway Mallampati: II  TM Distance: >3 FB Neck ROM: Full    Dental  (+) Edentulous Upper, Edentulous Lower   Pulmonary COPD, Current Smoker,    breath sounds clear to auscultation       Cardiovascular hypertension, Pt. on medications + CAD, + CABG and + Peripheral Vascular Disease   Rhythm:Regular Rate:Normal  2014 Myoview, No ischemia. EF 70%   Neuro/Psych Anxiety  Neuromuscular disease    GI/Hepatic negative GI ROS, Neg liver ROS,   Endo/Other  negative endocrine ROS  Renal/GU negative Renal ROS     Musculoskeletal  (+) Arthritis ,   Abdominal   Peds  Hematology negative hematology ROS (+)   Anesthesia Other Findings   Reproductive/Obstetrics                            Lab Results  Component Value Date   WBC 8.4 10/07/2016   HGB 14.8 10/07/2016   HCT 43.8 10/07/2016   MCV 94.4 10/07/2016   PLT 273 10/07/2016   Lab Results  Component Value Date   CREATININE 0.95 10/09/2016   BUN 13 10/09/2016   NA 136 10/09/2016   K 3.7 10/09/2016   CL 99 (L) 10/09/2016   CO2 27 10/09/2016    Anesthesia Physical Anesthesia Plan  ASA: III  Anesthesia Plan: General   Post-op Pain Management:    Induction: Intravenous  Airway Management Planned: Oral ETT and Video Laryngoscope Planned  Additional Equipment:   Intra-op Plan:   Post-operative Plan: Extubation in OR  Informed Consent: I have reviewed the patients History and Physical, chart, labs and discussed the procedure including the risks, benefits and alternatives for the proposed anesthesia with the patient or authorized representative who has indicated his/her understanding and acceptance.   Dental advisory given  Plan Discussed with:  CRNA  Anesthesia Plan Comments:         Anesthesia Quick Evaluation

## 2016-10-10 DIAGNOSIS — E538 Deficiency of other specified B group vitamins: Secondary | ICD-10-CM | POA: Diagnosis not present

## 2016-10-10 DIAGNOSIS — E785 Hyperlipidemia, unspecified: Secondary | ICD-10-CM | POA: Diagnosis not present

## 2016-10-10 DIAGNOSIS — J449 Chronic obstructive pulmonary disease, unspecified: Secondary | ICD-10-CM | POA: Diagnosis not present

## 2016-10-10 DIAGNOSIS — I2510 Atherosclerotic heart disease of native coronary artery without angina pectoris: Secondary | ICD-10-CM | POA: Diagnosis not present

## 2016-10-10 DIAGNOSIS — R627 Adult failure to thrive: Secondary | ICD-10-CM | POA: Diagnosis not present

## 2016-10-10 DIAGNOSIS — E291 Testicular hypofunction: Secondary | ICD-10-CM | POA: Diagnosis not present

## 2016-10-10 DIAGNOSIS — L570 Actinic keratosis: Secondary | ICD-10-CM | POA: Diagnosis not present

## 2016-10-10 DIAGNOSIS — M5001 Cervical disc disorder with myelopathy,  high cervical region: Secondary | ICD-10-CM | POA: Diagnosis not present

## 2016-10-10 DIAGNOSIS — I739 Peripheral vascular disease, unspecified: Secondary | ICD-10-CM | POA: Diagnosis not present

## 2016-10-10 DIAGNOSIS — I119 Hypertensive heart disease without heart failure: Secondary | ICD-10-CM | POA: Diagnosis not present

## 2016-10-10 DIAGNOSIS — R296 Repeated falls: Secondary | ICD-10-CM | POA: Diagnosis not present

## 2016-10-10 DIAGNOSIS — R531 Weakness: Secondary | ICD-10-CM | POA: Diagnosis not present

## 2016-10-10 DIAGNOSIS — E559 Vitamin D deficiency, unspecified: Secondary | ICD-10-CM | POA: Diagnosis not present

## 2016-10-10 DIAGNOSIS — R251 Tremor, unspecified: Secondary | ICD-10-CM | POA: Diagnosis not present

## 2016-10-10 LAB — BASIC METABOLIC PANEL
ANION GAP: 8 (ref 5–15)
BUN: 10 mg/dL (ref 6–20)
CHLORIDE: 101 mmol/L (ref 101–111)
CO2: 25 mmol/L (ref 22–32)
Calcium: 9 mg/dL (ref 8.9–10.3)
Creatinine, Ser: 0.9 mg/dL (ref 0.61–1.24)
GFR calc non Af Amer: >60 mL/min (ref >60)
GLUCOSE: 157 mg/dL — AB (ref 65–99)
Potassium: 4.6 mmol/L (ref 3.5–5.1)
Sodium: 134 mmol/L — ABNORMAL LOW (ref 135–145)

## 2016-10-10 LAB — CBC
HEMATOCRIT: 42.8 % (ref 39.0–52.0)
HEMOGLOBIN: 14.5 g/dL (ref 13.0–17.0)
MCH: 31.6 pg (ref 26.0–34.0)
MCHC: 33.9 g/dL (ref 30.0–36.0)
MCV: 93.2 fL (ref 78.0–100.0)
Platelets: 261 10*3/uL (ref 150–400)
RBC: 4.59 MIL/uL (ref 4.22–5.81)
RDW: 13.1 % (ref 11.5–15.5)
WBC: 11.7 10*3/uL — ABNORMAL HIGH (ref 4.0–10.5)

## 2016-10-10 MED ORDER — DEXAMETHASONE SODIUM PHOSPHATE 10 MG/ML IJ SOLN
4.0000 mg | Freq: Two times a day (BID) | INTRAMUSCULAR | Status: DC
  Administered 2016-10-10 – 2016-10-11 (×3): 4 mg via INTRAVENOUS
  Filled 2016-10-10 (×3): qty 1

## 2016-10-10 MED ORDER — HYDROMORPHONE HCL 1 MG/ML IJ SOLN
1.0000 mg | INTRAMUSCULAR | Status: DC | PRN
  Administered 2016-10-10: 1 mg via INTRAVENOUS
  Filled 2016-10-10: qty 1

## 2016-10-10 NOTE — Anesthesia Postprocedure Evaluation (Signed)
Anesthesia Post Note  Patient: Joe [name]  Procedure(s) Performed: Procedure(s) (LRB): ANTERIOR CERVICAL DECOMPRESSION/DISCECTOMY FUSION, Cervical three - four (N/A)  Patient location during evaluation: PACU Anesthesia Type: General Level of consciousness: awake Pain management: pain level controlled Vital Signs Assessment: post-procedure vital signs reviewed and stable Cardiovascular status: stable Anesthetic complications: no       Last Vitals:  Vitals:   10/09/16 2245 10/10/16 0000  BP: (!) 107/56 (!) 126/59  Pulse: 84 86  Resp: 20 20  Temp: 36.7 C 36.7 C    Last Pain:  Vitals:   10/10/16 0000  TempSrc: Oral  PainSc:                  Lesley Galentine

## 2016-10-10 NOTE — Progress Notes (Signed)
Patient ID: Joe [name], male   DOB: Oct 29, 1945, 71 y.o.   MRN: [id number] Vital signs are stable Patient having some difficulty with swallowing Motor function appears B improving in the upper extremities already Patient notes that he can walk and stand better than he had preoperatively. Complains of some fullness in his throat and his tongue feels thick I've advised we give him another dose or 2 of Decadron.

## 2016-10-10 NOTE — Progress Notes (Signed)
Patient sitting on side of bed, alert, verbally pleasant.  Denies any pain or discomfort.  No noted swelling of mouth or tongue.

## 2016-10-10 NOTE — Progress Notes (Addendum)
Patient ID: Joe [name], male   DOB: [birth year], 71 y.o.   MRN: [id number] Patient feeling better increased sensation M is still some burning dysesthetic pain.  Strength 4+ out of 5 upper extremity is 5 out of 5 lower extremity is incision clean dry and intact with minimal swelling.  Continued observation mobilize with therapy

## 2016-10-10 NOTE — Progress Notes (Signed)
Patient refused medications stating he feels the hydrocodone has caused some "swelling".  Patient does not appear to be in distress.   Alert, oriented, talking with angry outbursts.

## 2016-10-10 NOTE — Progress Notes (Signed)
PT Cancellation Note  Patient Details Name: DYLE RENFRO MRN: [id number] DOB: [birth year]   Cancelled Treatment:    Reason Eval/Treat Not Completed: Patient declined, no reason specified. Pt very angry this AM. States he has been getting the wrong medicine for 3 days. He is reporting increased edema in neck and BUE. He says " The only exercise I'm doing today is yelling at these doctors."   Lorriane Shire 10/10/2016, 9:33 AM

## 2016-10-10 NOTE — Progress Notes (Signed)
OT Cancellation Note  Patient Details Name: SHAVAR JAWOROWSKI MRN: [id number] DOB: 25-Apr-1946   Cancelled Treatment:    Reason Eval/Treat Not Completed: Patient declined, no reason specified Pt currently upset with staff and refusing all therapy at this time. OT to reattempt at a more appropriate time.   Vonita Moss   OTR/L Pager: [phone] Office: [phone] .  10/10/2016, 9:58 AM

## 2016-10-10 NOTE — Progress Notes (Signed)
PROGRESS NOTE    Joe [name]  YNW:[id number] DOB: 05/06/1946 DOA: 10/06/2016 PCP: Walker Kehr, MD    Brief Narrative: Joe [name] is a 70 year old male with a PMH of COPD, tobacco abuse, HTN, hypogonadism, CAD s/p CABG, PVD, obesity, and hx of cervical surgery presenting to the ED with bilateral hand numbness and tingling and progressive upper and lower extremity tinging and weakness over the last month. This has caused him to be unable to stand, walk, or take care of himself. He had a recent URI. He was seen by Neurology in the ED, which recommended Pt undergo LP to rule out atypical presentation of Guillain-Barre. CSF was unremarkable.    Assessment & Plan:   Principal Problem:   Polyneuropathy ([place]) Active Problems:   Hypogonadism in male   Vitamin D deficiency   Essential tremor   Essential hypertension   Coronary atherosclerosis   PVD (peripheral vascular disease) (HCC)   COPD mixed type (HCC)   Hyperlipidemia   Morbid obesity (HCC)   Urinary incontinence   Falls   Weakness of distal arms and legs  Cervical Myelopathy C 3 -C 4;  Polyneuropathy;  Pt having bilateral palmar hand numbness and upper and lower extremity tingling and weakness for the last month. Also urinary incontinence Initial work-up including CK, LDH, CRP, ESR, B12, TSH, Vitamin D, anemia panel, UA, RPR, HIV, CMV, CT head/neck, LP were unremarkable except for a very mildly elevated ESR to 18 and low Vitamin D to 9.5. B 12 low; getting supplement.  MRI brain negative. MRI C spine with C 3- 4 disc extrusion, cord compression.  Neurology consulted. Follow recommendation.  CSF ; no growth to date,  CMV IgM negative,Lyme negative,  HIV negative,  EBV IgG positive. Prior infection. Unclear if this would cause neuropathy.  Neurosurgery consulted. Patient underwent cord decompression 1-16. Appreciate Dr Ellene Route evaluation.  -he is complaining of difficulty swallowing, swelling tongue. He dint received  hydrocodone, only low dose dilaudid.  No evidence of angioedema on exam, speaking in full sentence oxygen sat normal, no wheezing. Discussed with Dr Ellene Route, will try low dose decadron.   Hypogonadism: - Testosterone level low.   Vitamin D deficiency:  - Vitamin D level low  -50,000 units once a weeks for 6 weeks. He will need further suplemments.   HTN: Currently normotensive - Continue home Norvasc, Lasix, Cozaar  COPD: Stable - Continue home Combivent  CAD: s/p CABG x 4. Not on statin or beta blocker. - Continue home Aspirin  PVD: - Lower extremity duplex US and ABIs pending final report . , mild to moderate diseases. Discussed results with Dr Scot Dock, no further vascular evaluation needed.  -would not explain his symptoms.  DVT prophylaxis: lovenox Code Status: full code.  Family Communication: care discussed with patient.  Disposition Plan: Sx today    Consultants:   Neurology    Procedures:   LP   Antimicrobials: none   Subjective: He is very upset because hydrocodone was ordered. He has allergies to codeine and hydrocodone. He gets weak, dizzy...  He report difficulty swallowing and feels tongue is swollen.   Objective: Vitals:   10/09/16 2245 10/10/16 0000 10/10/16 0506 10/10/16 0920  BP: (!) 107/56 (!) 126/59 (!) 144/76 (!) 151/69  Pulse: 84 86 (!) 108 86  Resp: _0 Temp: 98.1 F (36.7 C) 98 F (36.7 C) 98 F (36.7 C) 99 F (37.2 C)  TempSrc:  Oral Oral Oral  SpO2: 97% 98% 98%  94%  Weight:      Height:        Intake/Output Summary (Last 24 hours) at 10/10/16 0957 Last data filed at 10/10/16 0329  Gross per 24 hour  Intake             1305 ml  Output               25 ml  Net             1280 ml   Filed Weights   10/06/16 1134 10/07/16 0500  Weight: 128.8 kg (284 lb) 122.1 kg (269 lb 3.2 oz)    Examination:  General exam: Appears calm and comfortable  Respiratory system: Clear to auscultation. Respiratory effort  normal. Cardiovascular system: S1 & S2 heard, RRR. No JVD, murmurs, rubs, gallops or clicks. No pedal edema. Gastrointestinal system: Abdomen is nondistended, soft and nontender. No organomegaly or masses felt. Normal bowel sounds heard. Central nervous system: Alert and oriented.  Skin: No rashes, lesions or ulcers     Data Reviewed: I have personally reviewed following labs and imaging studies  CBC:  Recent Labs Lab 10/06/16 1242 10/06/16 1317 10/07/16 0356 10/10/16 0441  WBC 8.5  --  8.4 11.7*  NEUTROABS 5.4  --   --   --   HGB 14.5 14.6 14.8 14.5  HCT 43.0 43.0 43.8 42.8  MCV 94.1  --  94.4 93.2  PLT 265  --  273 017   Basic Metabolic Panel:  Recent Labs Lab 10/06/16 1242 10/06/16 1317 10/07/16 0356 10/09/16 1433 10/10/16 0441  NA 137 140 138 136 134*  K 3.8 3.7 4.1 3.7 4.6  CL 99* 99* 98* 99* 101  CO2 26  --  _0 GLUCOSE 97 95 94 125* 157*  BUN _1 CREATININE 0.98 0.80 0.98 0.95 0.90  CALCIUM 9.2  --  9.2 9.4 9.0   GFR: Estimated Creatinine Clearance: 101.4 mL/min (by C-G formula based on SCr of 0.9 mg/dL). Liver Function Tests:  Recent Labs Lab 10/06/16 1242 10/07/16 0356  AST 25 29  ALT 27 28  ALKPHOS 58 63  BILITOT 0.9 1.4*  PROT 6.7 6.9  ALBUMIN 3.8 3.8   No results for input(s): LIPASE, AMYLASE in the last 168 hours. No results for input(s): AMMONIA in the last 168 hours. Coagulation Profile: No results for input(s): INR, PROTIME in the last 168 hours. Cardiac Enzymes:  Recent Labs Lab 10/06/16 1612  CKTOTAL 187   BNP (last 3 results) No results for input(s): PROBNP in the last 8760 hours. HbA1C: No results for input(s): HGBA1C in the last 72 hours. CBG:  Recent Labs Lab 10/08/16 1220  GLUCAP 117*   Lipid Profile: No results for input(s): CHOL, HDL, LDLCALC, TRIG, CHOLHDL, LDLDIRECT in the last 72 hours. Thyroid Function Tests: No results for input(s): TSH, T4TOTAL, FREET4, T3FREE, THYROIDAB in the last 72  hours. Anemia Panel: No results for input(s): VITAMINB12, FOLATE, FERRITIN, TIBC, IRON, RETICCTPCT in the last 72 hours. Sepsis Labs: No results for input(s): PROCALCITON, LATICACIDVEN in the last 168 hours.  Recent Results (from the past 240 hour(s))  CSF culture     Status: None   Collection Time: 10/06/16  4:15 PM  Result Value Ref Range Status   Specimen Description CSF  Final   Special Requests Normal  Final   Gram Stain   Final    WBC PRESENT,BOTH PMN AND MONONUCLEAR NO ORGANISMS SEEN CYTOSPIN  Culture NO GROWTH 3 DAYS  Final   Report Status 10/09/2016 FINAL  Final  Surgical PCR screen     Status: Abnormal   Collection Time: 10/09/16  5:08 PM  Result Value Ref Range Status   MRSA, PCR POSITIVE (A) NEGATIVE Final    Comment: CRITICAL RESULT CALLED TO, READ BACK BY AND VERIFIED WITH: Lowella Bandy, RN 10/09/16 AT 1905 BY J FUDESCO    Staphylococcus aureus POSITIVE (A) NEGATIVE Final    Comment:        The Xpert SA Assay (FDA approved for NASAL specimens in patients over 27 years of age), is one component of a comprehensive surveillance program.  Test performance has been validated by [facility] for patients greater than or equal to 7 year old. It is not intended to diagnose infection nor to guide or monitor treatment. CRITICAL RESULT CALLED TO, READ BACK BY AND VERIFIED WITH: Lowella Bandy, RN, 10/09/16 AT 1905 BY J [facility]          Radiology Studies: Mr Jeri Cos Wo Contrast  Result Date: 10/08/2016 CLINICAL DATA:  Weakness in all 4 extremities. EXAM: MRI HEAD WITHOUT AND WITH CONTRAST MRI CERVICAL SPINE WITHOUT AND WITH CONTRAST TECHNIQUE: Multiplanar, multiecho pulse sequences of the brain and surrounding structures, and cervical spine, to include the craniocervical junction and cervicothoracic junction, were obtained without and with intravenous contrast. CONTRAST:  52m MULTIHANCE GADOBENATE DIMEGLUMINE 529 MG/ML IV SOLN COMPARISON:  CT of the head and  cervical spine 10/06/2016. FINDINGS: MRI HEAD FINDINGS Brain: No evidence for acute infarction, hemorrhage, mass lesion, hydrocephalus, or extra-axial fluid. Moderate cerebral and cerebellar atrophy. Moderately advanced T2 and FLAIR hyperintensities throughout the white matter, representing chronic microvascular ischemic change. Chronic infarction RIGHT occipital lobe. Post infusion, no abnormal enhancement of the brain or meninges. Vascular: Flow voids are maintained throughout the carotid, basilar, and vertebral arteries. There are no areas of chronic hemorrhage. Skull and upper cervical spine: Normal marrow signal. Sinuses/Orbits: Negative. Other: None. MRI CERVICAL SPINE FINDINGS Alignment: Reversal of the normal cervical lordotic curve. 2 mm anterolisthesis C3-4. Vertebrae: Mild enhancement above and below the C3-4 interspace, greater in the C3 vertebral body, consistent with active degenerative change. Status post C5-6 ACDF with solid appearing arthrodesis. Cord: Severe cord compression at C3-4. Abnormal cord signal. No cord enhancement. Posterior Fossa, vertebral arteries, paraspinal tissues: No paraspinous abnormalities. Vertebral arteries patent, and equal. Disc levels: C2-3: Annular bulge. Facet arthropathy. BILATERAL C3 foraminal narrowing. Mild stenosis. C3-4: Central disc extrusion. 2 mm anterolisthesis is facet mediated. Severe stenosis with cord compression and abnormal cord signal. Canal diameter 4-5 mm. Facet arthropathy. Bony overgrowth. LEFT greater than RIGHT C4 foraminal narrowing. C4-5: Unremarkable disc space. Facet arthropathy. Uncinate spurring. BILATERAL C5 foraminal narrowing. C5-6: Solid appearing ACDF. Uncinate spurring extends more to the LEFT. LEFT C6 foraminal narrowing. Slight effacement anterior subarachnoid space without cord flattening. C6-7: Advanced disc space narrowing representing adjacent segment disease. Disc osteophyte complex with BILATERAL uncinate spurring and facet  arthropathy. No significant stenosis. BILATERAL C7 foraminal narrowing is observed. C7-T1:  Disc space narrowing.  Facet arthropathy.  No impingement. IMPRESSION: MRI BRAIN: Atrophy and small vessel disease. No acute intracranial findings. MRI CERVICAL SPINE: Severe spinal stenosis at the C3-4 is multifactorial, related to slip, posterior element hypertrophy, and central disc extrusion. Cord compression with abnormal cord signal is observed. Correlate clinically for symptomatic myelopathy. Multilevel spondylosis elsewhere could contribute to radicular symptoms, but no other areas of cord compression are seen. Electronically Signed   By:  Staci Righter M.D.   On: 10/08/2016 12:11   Mr Cervical Spine W Wo Contrast  Result Date: 10/08/2016 CLINICAL DATA:  Weakness in all 4 extremities. EXAM: MRI HEAD WITHOUT AND WITH CONTRAST MRI CERVICAL SPINE WITHOUT AND WITH CONTRAST TECHNIQUE: Multiplanar, multiecho pulse sequences of the brain and surrounding structures, and cervical spine, to include the craniocervical junction and cervicothoracic junction, were obtained without and with intravenous contrast. CONTRAST:  75m MULTIHANCE GADOBENATE DIMEGLUMINE 529 MG/ML IV SOLN COMPARISON:  CT of the head and cervical spine 10/06/2016. FINDINGS: MRI HEAD FINDINGS Brain: No evidence for acute infarction, hemorrhage, mass lesion, hydrocephalus, or extra-axial fluid. Moderate cerebral and cerebellar atrophy. Moderately advanced T2 and FLAIR hyperintensities throughout the white matter, representing chronic microvascular ischemic change. Chronic infarction RIGHT occipital lobe. Post infusion, no abnormal enhancement of the brain or meninges. Vascular: Flow voids are maintained throughout the carotid, basilar, and vertebral arteries. There are no areas of chronic hemorrhage. Skull and upper cervical spine: Normal marrow signal. Sinuses/Orbits: Negative. Other: None. MRI CERVICAL SPINE FINDINGS Alignment: Reversal of the normal  cervical lordotic curve. 2 mm anterolisthesis C3-4. Vertebrae: Mild enhancement above and below the C3-4 interspace, greater in the C3 vertebral body, consistent with active degenerative change. Status post C5-6 ACDF with solid appearing arthrodesis. Cord: Severe cord compression at C3-4. Abnormal cord signal. No cord enhancement. Posterior Fossa, vertebral arteries, paraspinal tissues: No paraspinous abnormalities. Vertebral arteries patent, and equal. Disc levels: C2-3: Annular bulge. Facet arthropathy. BILATERAL C3 foraminal narrowing. Mild stenosis. C3-4: Central disc extrusion. 2 mm anterolisthesis is facet mediated. Severe stenosis with cord compression and abnormal cord signal. Canal diameter 4-5 mm. Facet arthropathy. Bony overgrowth. LEFT greater than RIGHT C4 foraminal narrowing. C4-5: Unremarkable disc space. Facet arthropathy. Uncinate spurring. BILATERAL C5 foraminal narrowing. C5-6: Solid appearing ACDF. Uncinate spurring extends more to the LEFT. LEFT C6 foraminal narrowing. Slight effacement anterior subarachnoid space without cord flattening. C6-7: Advanced disc space narrowing representing adjacent segment disease. Disc osteophyte complex with BILATERAL uncinate spurring and facet arthropathy. No significant stenosis. BILATERAL C7 foraminal narrowing is observed. C7-T1:  Disc space narrowing.  Facet arthropathy.  No impingement. IMPRESSION: MRI BRAIN: Atrophy and small vessel disease. No acute intracranial findings. MRI CERVICAL SPINE: Severe spinal stenosis at the C3-4 is multifactorial, related to slip, posterior element hypertrophy, and central disc extrusion. Cord compression with abnormal cord signal is observed. Correlate clinically for symptomatic myelopathy. Multilevel spondylosis elsewhere could contribute to radicular symptoms, but no other areas of cord compression are seen. Electronically Signed   By: JStaci RighterM.D.   On: 10/08/2016 12:11   Dg Spine Portable 1 View  Result Date:  10/09/2016 CLINICAL DATA:  ACDF. EXAM: PORTABLE SPINE - 1 VIEW COMPARISON:  CT cervical spine 10/06/2016 FINDINGS: Cross-table lateral views of cervical spine are obtained. Evaluation is limited due to patient body habitus and technique. The upper cervical levels from the skullbase through C4 are visualized. A localization marker is placed over the anterior aspect of the C2-3 interspace. A later image demonstrates postoperative changes with anterior plate and screw fixation at C3-4. IMPRESSION: Portable cross-table lateral views of the cervical spine are obtained for surgical localization purposes. A localization marker is demonstrated at C2-3 interspace. Subsequent images demonstrate anterior plate and screw fixation at C3-4. Electronically Signed   By: WLucienne CapersM.D.   On: 10/09/2016 22:20        Scheduled Meds: . amLODipine  5 mg Oral Daily  . aspirin EC  81 mg Oral  Daily  .  ceFAZolin (ANCEF) IV  2 g Intravenous Q8H  . furosemide  20 mg Oral BID  . gabapentin  100 mg Oral TID  . mupirocin ointment   Nasal BID  . pyridoxine  100 mg Oral Daily  . sodium chloride flush  3 mL Intravenous Q12H  . vitamin B-12  1,500 mcg Oral Daily  . Vitamin D (Ergocalciferol)  50,000 Units Oral Q7 days   Continuous Infusions: . sodium chloride    . lactated ringers 10 mL/hr at 10/09/16 1730     LOS: 1 day    Time spent: 35 minutes.     Elmarie Shiley, MD [facility] Pager [phone]  If 7PM-7AM, please contact night-coverage [email or web address] Password [facility] 10/10/2016, 9:57 AM

## 2016-10-11 MED ORDER — VITAMIN D (ERGOCALCIFEROL) 1.25 MG (50000 UNIT) PO CAPS: 50000.0000 [IU] | ORAL_CAPSULE | ORAL | 0 refills | Status: DC

## 2016-10-11 MED ORDER — ACETAMINOPHEN 325 MG PO TABS: 650.0000 mg | ORAL_TABLET | Freq: Four times a day (QID) | ORAL | 0 refills | Status: AC | PRN

## 2016-10-11 NOTE — Progress Notes (Signed)
OT Cancellation Note  Patient Details Name: Joe [name] MRN: [id number] DOB: 03-23-1946   Cancelled Treatment:    Reason Eval/Treat Not Completed: OT screened, no needs identified, will sign off. Spoke with RN who stated that Pt is completely independent. Has been mobilizing, using restroom, got dressed independently, and Pt anxious to get home and out of hospital.  Jaci Carrel 10/11/2016, 1:36 PM  Hulda Humphrey OTR/L [phone]

## 2016-10-11 NOTE — Care Management Note (Signed)
Case Management Note  Patient Details  Name: Joe [name] MRN: [id number] Date of Birth: Mar 27, 1946  Subjective/Objective:                  Polyneuropathy [facility]) Action/Plan: Discharge planning Expected Discharge Date:  10/11/16               Expected Discharge Plan:  [place]  In-House Referral:     Discharge planning Services  CM Consult  Post Acute Care Choice:  Home Health Choice offered to:  Patient  DME Arranged:    DME Agency:  [facility]  [place]:  PT, OT [place] Agency:  [facility] ([facility]  Status of Service:  Completed, signed off  If discussed at [employer], dates discussed:    Additional Comments: CM met with pt for choice and pt chooses [facility] to render HHPT/OT.  Referral called to [facility] rep, [place]. Pt states he has a rolling walker and doe not want a 3n1.  No other CM needs were communicated. Dellie Catholic, RN 10/11/2016, 1:51 PM

## 2016-10-11 NOTE — Progress Notes (Signed)
Patient given discharge instructions along with prescriptions.  All questions and concerns addressed.  IV catheter removed without difficulty. Patient left unit by wheelchair accompanied by staff.

## 2016-10-11 NOTE — Discharge Summary (Signed)
Physician Discharge Summary  DENSEL KRONICK JAS:[id number] DOB: 02-16-1946 DOA: 10/06/2016  PCP: Walker Kehr, MD  Admit date: 10/06/2016 Discharge date: 10/11/2016  Admitted From: Home  Disposition: Home   Recommendations for Outpatient Follow-up:  1. Follow up with PCP in 1-2 weeks 2. Please obtain BMP/CBC in one week 3. He needs to follow up with Dr Ellene Route for care after surgery 4. He will need further prescription for vitamin D supplement.   Home Health; yes.   Discharge Condition: Stable.  CODE STATUS: Full code.  Diet recommendation: Heart Healthy   Brief/Interim Summary: Mathhew is a 71 year old male with a PMH of COPD, tobacco abuse, HTN, hypogonadism, CAD s/p CABG, PVD, obesity, and hx of cervical surgery presenting to the ED with bilateral hand numbness and tingling and progressive upper and lower extremity tinging and weakness over the last month. This has caused him to be unable to stand, walk, or take care of himself. He had a recent URI. He was seen by Neurology in the ED, which recommended Pt undergo LP to rule out atypical presentation of Guillain-Barre. CSF was unremarkable.    Assessment & Plan:   Principal Problem:   Polyneuropathy ([place]) Active Problems:   Hypogonadism in male   Vitamin D deficiency   Essential tremor   Essential hypertension   Coronary atherosclerosis   PVD (peripheral vascular disease) (HCC)   COPD mixed type (HCC)   Hyperlipidemia   Morbid obesity (HCC)   Urinary incontinence   Falls   Weakness of distal arms and legs  Cervical Myelopathy C 3 -C 4;  Polyneuropathy; Cervical Myelopathy C 3-4 Pt having bilateral palmar hand numbness and upper and lower extremity tingling and weakness for the last month. Also urinary incontinence Initial work-up including CK, LDH, CRP, ESR, B12, TSH, Vitamin D, anemia panel, UA, RPR, HIV, CMV, CT head/neck, LP were unremarkable except for a very mildly elevated ESR to 18 and low Vitamin D to  9.5. B 12 low; getting supplement.  MRI brain negative. MRI C spine with C 3- 4 disc extrusion, cord compression.  Neurology consulted. Follow recommendation.  CSF ; no growth to date,  CMV IgM negative,Lyme negative,  HIV negative,  EBV IgG positive. Prior infection. Unclear if this would cause neuropathy.  Neurosurgery consulted. Patient underwent cord decompression 1-16. Appreciate Dr Ellene Route evaluation.  -he is complaining of difficulty swallowing, he received  Few doses of decadron. He has been able to tolerates clear, full liquid diet. Advised to drink ensure.  Stable for discharge. May arrange.  He has notice improvement of his gait and ambulation.   Hypogonadism: - Testosterone level low. Follow up with PCP   Vitamin D deficiency:  - Vitamin D level low  -50,000 units once a weeks for 6 weeks. He will need further suplemments.   HTN: Currently normotensive - Continue home Norvasc, Lasix, Cozaar  COPD: Stable - Continue home Combivent  CAD: s/p CABG x 4. Not on statin or beta blocker. - Continue home Aspirin, ok per neurosurgery   PVD: - Lower extremity duplex US and ABIspending final report . , mild to moderate diseases. Discussed results with Dr Scot Dock, no further vascular evaluation needed.  -would not explain his symptoms.   Discharge Diagnoses:  Principal Problem:   Polyneuropathy ([place]) Active Problems:   Hypogonadism in male   Vitamin D deficiency   Essential tremor   Essential hypertension   Coronary atherosclerosis   PVD (peripheral vascular disease) ([place])   COPD mixed type (  [place])   Hyperlipidemia   Morbid obesity (HCC)   Urinary incontinence   Falls   Weakness of distal arms and legs    Discharge Instructions  Discharge Instructions    Diet - low sodium heart healthy    Complete by:  As directed    Incentive spirometry RT    Complete by:  As directed    Increase activity slowly    Complete by:  As directed      Allergies as of  10/11/2016      Reactions   Lubiprostone Other (See Comments)   REACTION: bloating   Lubiprostone Other (See Comments)   abd pain   Nabumetone Other (See Comments)   REACTION: GI upset   Oxycodone Hcl Nausea And Vomiting   Primidone Other (See Comments)   REACTION: sleepy   Tramadol Other (See Comments)   Upset stomach   Atorvastatin Other (See Comments)   unknown   Codeine Phosphate Other (See Comments)   REACTION: unspecified Dizzy, blurry vision. Motor skill deterioration.    Esomeprazole Magnesium Other (See Comments)   REACTION: irregular heartbeat   Ezetimibe-simvastatin Other (See Comments)   Famotidine Other (See Comments)   Nausea and bloating.    Fexofenadine-pseudoephed Er Other (See Comments)   REACTION: unspecified   Hydrocodone Nausea And Vomiting   Nizatidine Other (See Comments)   Ticlopidine Hcl Other (See Comments)      Medication List    STOP taking these medications   HYDROcodone-acetaminophen 5-325 MG tablet Commonly known as:  NORCO/VICODIN     TAKE these medications   acetaminophen 325 MG tablet Commonly known as:  TYLENOL Take 2 tablets (650 mg total) by mouth every 6 (six) hours as needed for mild pain (or Fever >/= 101).   amLODipine 5 MG tablet Commonly known as:  NORVASC Take 1 tablet (5 mg total) by mouth daily.   aspirin 81 MG tablet Take 81 mg by mouth daily.   fluticasone 50 MCG/ACT nasal spray Commonly known as:  FLONASE Place 2 sprays into both nostrils daily. What changed:  when to take this  reasons to take this   furosemide 20 MG tablet Commonly known as:  LASIX Take 1 tablet (20 mg total) by mouth daily as needed for edema. What changed:  when to take this   gabapentin 100 MG capsule Commonly known as:  NEURONTIN Take 1 capsule (100 mg total) by mouth 3 (three) times daily.   Ipratropium-Albuterol 20-100 MCG/ACT Aers respimat Commonly known as:  COMBIVENT RESPIMAT Inhale 2 Act into the lungs 4 (four) times daily  - after meals and at bedtime. What changed:  when to take this  reasons to take this   losartan 100 MG tablet Commonly known as:  COZAAR TAKE 1 TABLET (100 MG TOTAL) BY MOUTH DAILY.   ondansetron 4 MG tablet Commonly known as:  ZOFRAN Take 1 tablet (4 mg total) by mouth every 8 (eight) hours as needed for nausea or vomiting.   pyridoxine 100 MG tablet Commonly known as:  B-6 Take 100 mg by mouth daily.   vitamin B-12 1000 MCG tablet Commonly known as:  CYANOCOBALAMIN Take 1,500 mcg by mouth daily. 1 1/2 tab po qd   Vitamin D (Ergocalciferol) 50000 units Caps capsule Commonly known as:  DRISDOL Take 1 capsule (50,000 Units total) by mouth every 7 (seven) days. Start taking on:  10/15/2016   Vitamin D 1000 units capsule Take 1,000 Units by mouth daily.      Follow-up  Information    Earleen Newport, MD Follow up in 1 week(s).   Specialty:  Neurosurgery Contact information: [address] [place] [address] [phone]        Walker Kehr, MD Follow up in 1 week(s).   Specialty:  Internal Medicine Contact information: [place] [address] [phone]          Allergies  Allergen Reactions  . Lubiprostone Other (See Comments)    REACTION: bloating  . Lubiprostone Other (See Comments)    abd pain  . Nabumetone Other (See Comments)    REACTION: GI upset  . Oxycodone Hcl Nausea And Vomiting  . Primidone Other (See Comments)    REACTION: sleepy  . Tramadol Other (See Comments)    Upset stomach  . Atorvastatin Other (See Comments)    unknown  . Codeine Phosphate Other (See Comments)    REACTION: unspecified Dizzy, blurry vision. Motor skill deterioration.   . Esomeprazole Magnesium Other (See Comments)    REACTION: irregular heartbeat  . Ezetimibe-Simvastatin Other (See Comments)  . Famotidine Other (See Comments)    Nausea and bloating.   [name] Fexofenadine-Pseudoephed Er Other (See Comments)    REACTION:  unspecified  . Hydrocodone Nausea And Vomiting  . Nizatidine Other (See Comments)  . Ticlopidine Hcl Other (See Comments)    Consultations:  Neurology  Neurosurgery    Procedures/Studies: Dg Chest 2 View  Result Date: 10/06/2016 CLINICAL DATA:  Neuropathy. EXAM: CHEST  2 VIEW COMPARISON:  09/07/2016 . FINDINGS: Prior CABG. Stable cardiomegaly. No pulmonary venous congestion. Low lung volumes with basilar atelectasis. No pleural effusion or pneumothorax. Prior cervical spine fusion . IMPRESSION: 1. Prior CABG.  Stable cardiomegaly. 2. Low lung volumes with bibasilar atelectasis . Electronically Signed   By: Marcello Moores  Register   On: 10/06/2016 12:19   Ct Head Wo Contrast  Result Date: 10/06/2016 CLINICAL DATA:  Numbness in fingers and bilateral leg weakness starting 09/07/2016 EXAM: CT HEAD WITHOUT CONTRAST CT CERVICAL SPINE WITHOUT CONTRAST TECHNIQUE: Multidetector CT imaging of the head and cervical spine was performed following the standard protocol without intravenous contrast. Multiplanar CT image reconstructions of the cervical spine were also generated. COMPARISON:  09/07/2016 and 06/08/2010 FINDINGS: CT HEAD FINDINGS Brain: No intracranial hemorrhage, mass effect or midline shift. Stable cerebral atrophy. Stable patchy subcortical chronic white matter disease. No definite acute cortical infarction. Ventricular size is stable from prior exam. No mass lesion is noted on this unenhanced scan. Stable mild periventricular chronic white matter disease. Vascular: Again noted atherosclerotic calcifications bilateral carotid siphon. Skull: No skull fracture is noted. Sinuses/Orbits: Mild mucosal thickening bilateral ethmoid air cells. No paranasal sinuses air-fluid levels. Other: None CT CERVICAL SPINE FINDINGS Alignment: Alignment is preserved. Skull base and vertebrae: No acute fracture or subluxation. Again noted anterior fusion with metallic plate and screws at G9-F6 level. There are degenerative  changes C1-C2 articulation. Moderate anterior spurring upper endplate of C5 vertebral body. Mild anterior spurring lower endplate of C4 vertebral body. There is mild anterior and mild posterior spurring at C6-C7 level. Mild anterior spurring at C7-T1 level. Soft tissues and spinal canal: No prevertebral soft tissue swelling. Minimal spinal canal stenosis due to posterior spurring at C6-C7 level. Disc levels: Minimal disc space flattening at C4-C5 level. There is moderate disc space flattening at C6-C7 and C7-T1 level. Upper chest: Visualized lung apices shows no evidence of pneumothorax. Partially visualized previous sternotomy. Other: None IMPRESSION: 1. No acute intracranial abnormality. Stable cerebral atrophy and  chronic white matter disease. No definite acute cortical infarction. 2. No cervical spine acute fracture or subluxation. Again noted anterior fusion with metallic plate and screws at Y6-T0 level with alignment preserved. Multilevel mild degenerative changes as described above. No prevertebral soft tissue swelling. Electronically Signed   By: Lahoma Crocker M.D.   On: 10/06/2016 12:40   Ct Cervical Spine Wo Contrast  Result Date: 10/06/2016 CLINICAL DATA:  Numbness in fingers and bilateral leg weakness starting 09/07/2016 EXAM: CT HEAD WITHOUT CONTRAST CT CERVICAL SPINE WITHOUT CONTRAST TECHNIQUE: Multidetector CT imaging of the head and cervical spine was performed following the standard protocol without intravenous contrast. Multiplanar CT image reconstructions of the cervical spine were also generated. COMPARISON:  09/07/2016 and 06/08/2010 FINDINGS: CT HEAD FINDINGS Brain: No intracranial hemorrhage, mass effect or midline shift. Stable cerebral atrophy. Stable patchy subcortical chronic white matter disease. No definite acute cortical infarction. Ventricular size is stable from prior exam. No mass lesion is noted on this unenhanced scan. Stable mild periventricular chronic white matter disease.  Vascular: Again noted atherosclerotic calcifications bilateral carotid siphon. Skull: No skull fracture is noted. Sinuses/Orbits: Mild mucosal thickening bilateral ethmoid air cells. No paranasal sinuses air-fluid levels. Other: None CT CERVICAL SPINE FINDINGS Alignment: Alignment is preserved. Skull base and vertebrae: No acute fracture or subluxation. Again noted anterior fusion with metallic plate and screws at P5-W6 level. There are degenerative changes C1-C2 articulation. Moderate anterior spurring upper endplate of C5 vertebral body. Mild anterior spurring lower endplate of C4 vertebral body. There is mild anterior and mild posterior spurring at C6-C7 level. Mild anterior spurring at C7-T1 level. Soft tissues and spinal canal: No prevertebral soft tissue swelling. Minimal spinal canal stenosis due to posterior spurring at C6-C7 level. Disc levels: Minimal disc space flattening at C4-C5 level. There is moderate disc space flattening at C6-C7 and C7-T1 level. Upper chest: Visualized lung apices shows no evidence of pneumothorax. Partially visualized previous sternotomy. Other: None IMPRESSION: 1. No acute intracranial abnormality. Stable cerebral atrophy and chronic white matter disease. No definite acute cortical infarction. 2. No cervical spine acute fracture or subluxation. Again noted anterior fusion with metallic plate and screws at F6-C1 level with alignment preserved. Multilevel mild degenerative changes as described above. No prevertebral soft tissue swelling. Electronically Signed   By: Lahoma Crocker M.D.   On: 10/06/2016 12:40   Mr Jeri Cos EX Contrast  Result Date: 10/08/2016 CLINICAL DATA:  Weakness in all 4 extremities. EXAM: MRI HEAD WITHOUT AND WITH CONTRAST MRI CERVICAL SPINE WITHOUT AND WITH CONTRAST TECHNIQUE: Multiplanar, multiecho pulse sequences of the brain and surrounding structures, and cervical spine, to include the craniocervical junction and cervicothoracic junction, were obtained  without and with intravenous contrast. CONTRAST:  5m MULTIHANCE GADOBENATE DIMEGLUMINE 529 MG/ML IV SOLN COMPARISON:  CT of the head and cervical spine 10/06/2016. FINDINGS: MRI HEAD FINDINGS Brain: No evidence for acute infarction, hemorrhage, mass lesion, hydrocephalus, or extra-axial fluid. Moderate cerebral and cerebellar atrophy. Moderately advanced T2 and FLAIR hyperintensities throughout the white matter, representing chronic microvascular ischemic change. Chronic infarction RIGHT occipital lobe. Post infusion, no abnormal enhancement of the brain or meninges. Vascular: Flow voids are maintained throughout the carotid, basilar, and vertebral arteries. There are no areas of chronic hemorrhage. Skull and upper cervical spine: Normal marrow signal. Sinuses/Orbits: Negative. Other: None. MRI CERVICAL SPINE FINDINGS Alignment: Reversal of the normal cervical lordotic curve. 2 mm anterolisthesis C3-4. Vertebrae: Mild enhancement above and below the C3-4 interspace, greater in the C3 vertebral body, consistent  with active degenerative change. Status post C5-6 ACDF with solid appearing arthrodesis. Cord: Severe cord compression at C3-4. Abnormal cord signal. No cord enhancement. Posterior Fossa, vertebral arteries, paraspinal tissues: No paraspinous abnormalities. Vertebral arteries patent, and equal. Disc levels: C2-3: Annular bulge. Facet arthropathy. BILATERAL C3 foraminal narrowing. Mild stenosis. C3-4: Central disc extrusion. 2 mm anterolisthesis is facet mediated. Severe stenosis with cord compression and abnormal cord signal. Canal diameter 4-5 mm. Facet arthropathy. Bony overgrowth. LEFT greater than RIGHT C4 foraminal narrowing. C4-5: Unremarkable disc space. Facet arthropathy. Uncinate spurring. BILATERAL C5 foraminal narrowing. C5-6: Solid appearing ACDF. Uncinate spurring extends more to the LEFT. LEFT C6 foraminal narrowing. Slight effacement anterior subarachnoid space without cord flattening. C6-7:  Advanced disc space narrowing representing adjacent segment disease. Disc osteophyte complex with BILATERAL uncinate spurring and facet arthropathy. No significant stenosis. BILATERAL C7 foraminal narrowing is observed. C7-T1:  Disc space narrowing.  Facet arthropathy.  No impingement. IMPRESSION: MRI BRAIN: Atrophy and small vessel disease. No acute intracranial findings. MRI CERVICAL SPINE: Severe spinal stenosis at the C3-4 is multifactorial, related to slip, posterior element hypertrophy, and central disc extrusion. Cord compression with abnormal cord signal is observed. Correlate clinically for symptomatic myelopathy. Multilevel spondylosis elsewhere could contribute to radicular symptoms, but no other areas of cord compression are seen. Electronically Signed   By: Staci Righter M.D.   On: 10/08/2016 12:11   Mr Cervical Spine W Wo Contrast  Result Date: 10/08/2016 CLINICAL DATA:  Weakness in all 4 extremities. EXAM: MRI HEAD WITHOUT AND WITH CONTRAST MRI CERVICAL SPINE WITHOUT AND WITH CONTRAST TECHNIQUE: Multiplanar, multiecho pulse sequences of the brain and surrounding structures, and cervical spine, to include the craniocervical junction and cervicothoracic junction, were obtained without and with intravenous contrast. CONTRAST:  11m MULTIHANCE GADOBENATE DIMEGLUMINE 529 MG/ML IV SOLN COMPARISON:  CT of the head and cervical spine 10/06/2016. FINDINGS: MRI HEAD FINDINGS Brain: No evidence for acute infarction, hemorrhage, mass lesion, hydrocephalus, or extra-axial fluid. Moderate cerebral and cerebellar atrophy. Moderately advanced T2 and FLAIR hyperintensities throughout the white matter, representing chronic microvascular ischemic change. Chronic infarction RIGHT occipital lobe. Post infusion, no abnormal enhancement of the brain or meninges. Vascular: Flow voids are maintained throughout the carotid, basilar, and vertebral arteries. There are no areas of chronic hemorrhage. Skull and upper cervical  spine: Normal marrow signal. Sinuses/Orbits: Negative. Other: None. MRI CERVICAL SPINE FINDINGS Alignment: Reversal of the normal cervical lordotic curve. 2 mm anterolisthesis C3-4. Vertebrae: Mild enhancement above and below the C3-4 interspace, greater in the C3 vertebral body, consistent with active degenerative change. Status post C5-6 ACDF with solid appearing arthrodesis. Cord: Severe cord compression at C3-4. Abnormal cord signal. No cord enhancement. Posterior Fossa, vertebral arteries, paraspinal tissues: No paraspinous abnormalities. Vertebral arteries patent, and equal. Disc levels: C2-3: Annular bulge. Facet arthropathy. BILATERAL C3 foraminal narrowing. Mild stenosis. C3-4: Central disc extrusion. 2 mm anterolisthesis is facet mediated. Severe stenosis with cord compression and abnormal cord signal. Canal diameter 4-5 mm. Facet arthropathy. Bony overgrowth. LEFT greater than RIGHT C4 foraminal narrowing. C4-5: Unremarkable disc space. Facet arthropathy. Uncinate spurring. BILATERAL C5 foraminal narrowing. C5-6: Solid appearing ACDF. Uncinate spurring extends more to the LEFT. LEFT C6 foraminal narrowing. Slight effacement anterior subarachnoid space without cord flattening. C6-7: Advanced disc space narrowing representing adjacent segment disease. Disc osteophyte complex with BILATERAL uncinate spurring and facet arthropathy. No significant stenosis. BILATERAL C7 foraminal narrowing is observed. C7-T1:  Disc space narrowing.  Facet arthropathy.  No impingement. IMPRESSION: MRI BRAIN: Atrophy and  small vessel disease. No acute intracranial findings. MRI CERVICAL SPINE: Severe spinal stenosis at the C3-4 is multifactorial, related to slip, posterior element hypertrophy, and central disc extrusion. Cord compression with abnormal cord signal is observed. Correlate clinically for symptomatic myelopathy. Multilevel spondylosis elsewhere could contribute to radicular symptoms, but no other areas of cord  compression are seen. Electronically Signed   By: Staci Righter M.D.   On: 10/08/2016 12:11   Dg Spine Portable 1 View  Result Date: 10/09/2016 CLINICAL DATA:  ACDF. EXAM: PORTABLE SPINE - 1 VIEW COMPARISON:  CT cervical spine 10/06/2016 FINDINGS: Cross-table lateral views of cervical spine are obtained. Evaluation is limited due to patient body habitus and technique. The upper cervical levels from the skullbase through C4 are visualized. A localization marker is placed over the anterior aspect of the C2-3 interspace. A later image demonstrates postoperative changes with anterior plate and screw fixation at C3-4. IMPRESSION: Portable cross-table lateral views of the cervical spine are obtained for surgical localization purposes. A localization marker is demonstrated at C2-3 interspace. Subsequent images demonstrate anterior plate and screw fixation at C3-4. Electronically Signed   By: Lucienne Capers M.D.   On: 10/09/2016 22:20      Subjective: He denies worsening swelling of his neck.  He is able to drink fluids and full liquid diet.   Discharge Exam: Vitals:   10/11/16 0520 10/11/16 1006  BP: (!) 152/77 (!) 146/54  Pulse: 80 73  Resp: 18 18  Temp: 98 F (36.7 C) 98.6 F (37 C)   Vitals:   10/11/16 0215 10/11/16 0500 10/11/16 0520 10/11/16 1006  BP: (!) 132/42  (!) 152/77 (!) 146/54  Pulse: 82  80 73  Resp: _0 Temp: 98.9 F (37.2 C)  98 F (36.7 C) 98.6 F (37 C)  TempSrc: Oral  Oral Oral  SpO2: 96%  94% 95%  Weight:  121.1 kg (267 lb)    Height:        General: Pt is alert, awake, not in acute distress. anterior neck incision, small hematoma.  Cardiovascular: RRR, S1/S2 +, no rubs, no gallops Respiratory: CTA bilaterally, no wheezing, no rhonchi Abdominal: Soft, NT, ND, bowel sounds + Extremities: no edema, no cyanosis    The results of significant diagnostics from this hospitalization (including imaging, microbiology, ancillary and laboratory) are listed  below for reference.     Microbiology: Recent Results (from the past 240 hour(s))  CSF culture     Status: None   Collection Time: 10/06/16  4:15 PM  Result Value Ref Range Status   Specimen Description CSF  Final   Special Requests Normal  Final   Gram Stain   Final    WBC PRESENT,BOTH PMN AND MONONUCLEAR NO ORGANISMS SEEN CYTOSPIN    Culture NO GROWTH 3 DAYS  Final   Report Status 10/09/2016 FINAL  Final  Surgical PCR screen     Status: Abnormal   Collection Time: 10/09/16  5:08 PM  Result Value Ref Range Status   MRSA, PCR POSITIVE (A) NEGATIVE Final    Comment: CRITICAL RESULT CALLED TO, READ BACK BY AND VERIFIED WITH: Lowella Bandy, RN 10/09/16 AT 1905 BY J FUDESCO    Staphylococcus aureus POSITIVE (A) NEGATIVE Final    Comment:        The Xpert SA Assay (FDA approved for NASAL specimens in patients over 22 years of age), is one component of a comprehensive surveillance program.  Test performance has been validated by [facility] for patients greater than or equal to 30 year old. It is not intended to diagnose infection nor to guide or monitor treatment. CRITICAL RESULT CALLED TO, READ BACK BY AND VERIFIED WITH: Lowella Bandy, RN, 10/09/16 AT 1905 BY J FUDESCO      Labs: BNP (last 3 results) No results for input(s): BNP in the last 8760 hours. Basic Metabolic Panel:  Recent Labs Lab 10/06/16 1242 10/06/16 1317 10/07/16 0356 10/09/16 1433 10/10/16 0441  NA 137 140 138 136 134*  K 3.8 3.7 4.1 3.7 4.6  CL 99* 99* 98* 99* 101  CO2 26  --  _0 GLUCOSE 97 95 94 125* 157*  BUN _1 CREATININE 0.98 0.80 0.98 0.95 0.90  CALCIUM 9.2  --  9.2 9.4 9.0   Liver Function Tests:  Recent Labs Lab 10/06/16 1242 10/07/16 0356  AST 25 29  ALT 27 28  ALKPHOS 58 63  BILITOT 0.9 1.4*  PROT 6.7 6.9  ALBUMIN 3.8 3.8   No results for input(s): LIPASE, AMYLASE in the last 168 hours. No results for input(s): AMMONIA in the last 168  hours. CBC:  Recent Labs Lab 10/06/16 1242 10/06/16 1317 10/07/16 0356 10/10/16 0441  WBC 8.5  --  8.4 11.7*  NEUTROABS 5.4  --   --   --   HGB 14.5 14.6 14.8 14.5  HCT 43.0 43.0 43.8 42.8  MCV 94.1  --  94.4 93.2  PLT 265  --  273 261   Cardiac Enzymes:  Recent Labs Lab 10/06/16 1612  CKTOTAL 187   BNP: Invalid input(s): POCBNP CBG:  Recent Labs Lab 10/08/16 1220  GLUCAP 117*   D-Dimer No results for input(s): DDIMER in the last 72 hours. Hgb A1c No results for input(s): HGBA1C in the last 72 hours. Lipid Profile No results for input(s): CHOL, HDL, LDLCALC, TRIG, CHOLHDL, LDLDIRECT in the last 72 hours. Thyroid function studies No results for input(s): TSH, T4TOTAL, T3FREE, THYROIDAB in the last 72 hours.  Invalid input(s): FREET3 Anemia work up No results for input(s): VITAMINB12, FOLATE, FERRITIN, TIBC, IRON, RETICCTPCT in the last 72 hours. Urinalysis    Component Value Date/Time   COLORURINE YELLOW 10/06/2016 1400   APPEARANCEUR CLEAR 10/06/2016 1400   LABSPEC 1.012 10/06/2016 1400   PHURINE 5.0 10/06/2016 1400   GLUCOSEU NEGATIVE 10/06/2016 1400   GLUCOSEU NEGATIVE 06/25/2015 0914   HGBUR NEGATIVE 10/06/2016 1400   BILIRUBINUR NEGATIVE 10/06/2016 1400   KETONESUR NEGATIVE 10/06/2016 1400   PROTEINUR NEGATIVE 10/06/2016 1400   UROBILINOGEN 1.0 06/25/2015 0914   NITRITE NEGATIVE 10/06/2016 1400   LEUKOCYTESUR NEGATIVE 10/06/2016 1400   Sepsis Labs Invalid input(s): PROCALCITONIN,  WBC,  LACTICIDVEN Microbiology Recent Results (from the past 240 hour(s))  CSF culture     Status: None   Collection Time: 10/06/16  4:15 PM  Result Value Ref Range Status   Specimen Description CSF  Final   Special Requests Normal  Final   Gram Stain   Final    WBC PRESENT,BOTH PMN AND MONONUCLEAR NO ORGANISMS SEEN CYTOSPIN    Culture NO GROWTH 3 DAYS  Final   Report Status 10/09/2016 FINAL  Final  Surgical PCR screen     Status: Abnormal   Collection Time:  10/09/16  5:08 PM  Result Value Ref Range Status   MRSA, PCR POSITIVE (A) NEGATIVE Final    Comment: CRITICAL RESULT CALLED TO, READ BACK BY AND VERIFIED WITH: Lowella Bandy, RN 10/09/16  AT 1905 BY J FUDESCO    Staphylococcus aureus POSITIVE (A) NEGATIVE Final    Comment:        The Xpert SA Assay (FDA approved for NASAL specimens in patients over 64 years of age), is one component of a comprehensive surveillance program.  Test performance has been validated by [facility] for patients greater than or equal to 12 year old. It is not intended to diagnose infection nor to guide or monitor treatment. CRITICAL RESULT CALLED TO, READ BACK BY AND VERIFIED WITH: Lowella Bandy, RN, 10/09/16 AT 1905 BY J FUDESCO      Time coordinating discharge: Over 30 minutes  SIGNED:   Elmarie Shiley, MD  [facility] 10/11/2016, 1:19 PM Pager   If 7PM-7AM, please contact night-coverage [email or web address] Password [email or web address]

## 2016-10-11 NOTE — Discharge Instructions (Signed)
Spinal Fusion, Care After Refer to this sheet in the next few weeks. These instructions provide you with information about caring for yourself after your procedure. Your health care provider may also give you more specific instructions. Your treatment has been planned according to current medical practices, but problems sometimes occur. Call your health care provider if you have any problems or questions after your procedure. WHAT TO EXPECT AFTER THE PROCEDURE After your procedure, it is common to have:  Pain and stiffness in your back.  Pain around your incision. HOME CARE INSTRUCTIONS  Medicines  Take over-the-counter and prescription medicines only as told by your health care provider. These include any pain medicines.  Do not drive for 24 hours if you received a sedative.  Do not drive or operate heavy machinery while taking prescription pain medicine.  If you were prescribed an antibiotic medicine, take it as told by your health care provider. Do not stop taking the antibiotic even if you start to feel better. Incision Care  Follow instructions from your health care provider about how to take care of your incision. Make sure you:  Wash your hands with soap and water before you change your bandage (dressing). If soap and water are not available, use hand sanitizer.  Change your dressing as told by your health care provider.  Leave stitches (sutures), skin glue, or adhesive strips in place. These skin closures may need to be in place for 2 weeks or longer. If adhesive strip edges start to loosen and curl up, you may trim the loose edges. Do not remove adhesive strips completely unless your health care provider tells you to do that.  Keep your incision clean and dry. Do not take baths, swim, or use a hot tub until your health care provider approves.  Check your incision and the surrounding area every day for redness, swelling, and leaking fluid. Physical Activity  Return to your  normal activities as told by your health care provider. Ask your health care provider what activities are safe for you. Rest and protect your back as much as possible.  Follow instructions from your health care provider about how to move and use good posture to help your spine heal.  Do not lift anything that is heavier than 8 lb (3.6 kg)or the limit that your health care provider tells you until he or she says that it is safe. Avoid lifting anything over your head.  Do not twist or bend at the waist until your health care provider approves.  Avoid pushing and pulling motions.  Avoid sitting or lying down in the same position for long periods of time.  Do not begin exercising until told by your health care provider. Ask your health care provider what kinds of exercise you can do to make your back stronger. General Instructions  If you were given a brace, use it as told by your health care provider.  Wear compression stockings as told by your health care provider. These stockings help to prevent blood clots and reduce swelling in your legs.  Do not use tobacco products, including cigarettes, chewing tobacco, or e-cigarettes. If you need help quitting, ask your health care provider.  Keep all follow-up visits as told by your health care provider and, if necessary, your physical therapist. This is important. SEEK MEDICAL CARE IF:  You have pain that gets worse or does not get better with medicine.  Your legs or feet become painful or swollen.  You have redness, swelling, or  pain at the site of your incision.  You have fluid, blood, or pus coming from your incision.  You vomit or feel nauseous.  You have weakness or numbness in your legs that is new or getting worse.  You have a fever.  You have trouble controlling urination or bowel movements. SEEK IMMEDIATE MEDICAL CARE IF:   You have severe pain.  You have chest pain.  You have trouble breathing.  You develop a  cough. These symptoms may represent a serious problem that is an emergency. Do not wait to see if the symptoms will go away. Get medical help right away. Call your local emergency services (911 in the [place] Do not drive yourself to the hospital. This information is not intended to replace advice given to you by your health care provider. Make sure you discuss any questions you have with your health care provider. Document Released: 02/27/2005 Document Revised: 12/02/2015 Document Reviewed: 01/23/2015 Elsevier Interactive Patient Education  2017 [employer].

## 2016-10-11 NOTE — Progress Notes (Signed)
Patient ID: Joe [name], male   DOB: 1946-08-09, 71 y.o.   MRN: [id number] Vital signs are stable Motor function appears to be intact though he complains of some dysesthesias in his hands with difficulty gripping Outpatient physical therapy and evaluation may be appropriate His voice is somewhat hoarse but this should settle down neck C days He may be discharged from neurosurgical standpoint if he is otherwise stable. I will follow him up in 3 weeks' time in the office

## 2016-10-11 NOTE — Progress Notes (Signed)
Physical Therapy Treatment Patient Details Name: Joe [name] MRN: [id number] DOB: August 27, 1945 Today's Date: 10/11/2016    History of Present Illness Pt is a 71 y.o. male who presented to the ED with B hand numbness and B upper and lower body weakness with difficulty ambulating. MD concerned for Cuillain-Barre syndrome. Pt has a PMH significant for COPD, tobacco abuse, hypertension, hypogonadism, vitamin D deficiency, essential tremot, CAD s/p CABG, peripheral vascular disease, obesity, and hx of prior cervical surgery.    PT Comments    Pt very pleasant this AM. Apologetic for his behavior yesterday. Signed off as independent for mobility in room. Pt needs to complete stair training tomorrow. Will discuss if HHPT is still desired at d/c as pt has made good progress with mobility. His current primary concern is numbness/tingling bilat hands.   Follow Up Recommendations  Home health PT     Equipment Recommendations  None recommended by PT    Recommendations for Other Services       Precautions / Restrictions Precautions Precautions: None    Mobility  Bed Mobility Overal bed mobility: Modified Independent             General bed mobility comments: Pt sitting EOB upon arrival  Transfers   Equipment used: Rolling walker (2 wheeled) Transfers: Stand Pivot Transfers Sit to Stand: Supervision Stand pivot transfers: Supervision       General transfer comment: supervision for safety only  Ambulation/Gait Ambulation/Gait assistance: Supervision Ambulation Distance (Feet): 200 Feet Assistive device: Rolling walker (2 wheeled) Gait Pattern/deviations: Step-through pattern;Trunk flexed Gait velocity: decreased Gait velocity interpretation: Below normal speed for age/gender General Gait Details: Heavy reliance on RW. Steady gait.   Stairs            Wheelchair Mobility    Modified Rankin (Stroke Patients Only)       Balance   Sitting-balance support:  Feet supported;No upper extremity supported Sitting balance-Leahy Scale: Good     Standing balance support: During functional activity;Bilateral upper extremity supported Standing balance-Leahy Scale: Fair                      Cognition Arousal/Alertness: Awake/alert Behavior During Therapy: WFL for tasks assessed/performed Overall Cognitive Status: Within Functional Limits for tasks assessed                      Exercises      General Comments        Pertinent Vitals/Pain Pain Assessment: Faces Faces Pain Scale: Hurts a little bit Pain Location: low back from MRI table Pain Descriptors / Indicators: Sore Pain Intervention(s): Monitored during session    Home Living                      Prior Function            PT Goals (current goals can now be found in the care plan section) Acute Rehab PT Goals Patient Stated Goal: figure out what's going on.  PT Goal Formulation: With patient Time For Goal Achievement: 10/14/16 Potential to Achieve Goals: Good Progress towards PT goals: Progressing toward goals    Frequency    Min 3X/week      PT Plan Current plan remains appropriate    Co-evaluation             End of Session   Activity Tolerance: Patient tolerated treatment well Patient left: in bed;with call bell/phone within reach  Time: [id number] PT Time Calculation (min) (ACUTE ONLY): 17 min  Charges:  $Gait Training: 8-22 mins                    G Codes:      Lorriane Shire 10/11/2016, 9:17 AM

## 2016-10-13 ENCOUNTER — Telehealth: Payer: Self-pay | Admitting: *Deleted

## 2016-10-13 NOTE — Telephone Encounter (Signed)
Transition Care Management Follow-up Telephone Call   Date discharged? 10/11/16   How have you been since you were released from the hospital? Pt states he is doing ok   Do you understand why you were in the hospital? YES   Do you understand the discharge instructions? YES   Where were you discharged to? Home   Items Reviewed:  Medications reviewed: YES  Allergies reviewed: YES  Dietary changes reviewed: NO  Referrals reviewed: YES, he states still waiting on Dr. Ellene Route appt   Functional Questionnaire:   Activities of Daily Living (ADLs):   He states he are independent in the following: bathing and hygiene, feeding, continence, grooming, toileting and dressing States they require assistance with the following: ambulation  Any transportation issues/concerns?: NO   Any patient concerns? NO   Confirmed importance and date/time of follow-up visits scheduled YES, appt 10/21/16  Provider Appointment booked with Dr. Alain Marion  Confirmed with patient if condition begins to worsen call PCP or go to the ER.  Patient was given the office number and encouraged to call back with question or concerns.  : YES

## 2016-10-14 ENCOUNTER — Encounter (HOSPITAL_COMMUNITY): Payer: Self-pay | Admitting: Neurological Surgery

## 2016-10-17 ENCOUNTER — Emergency Department (HOSPITAL_COMMUNITY): Payer: Medicare Other

## 2016-10-17 ENCOUNTER — Encounter (HOSPITAL_COMMUNITY): Payer: Self-pay | Admitting: Emergency Medicine

## 2016-10-17 ENCOUNTER — Inpatient Hospital Stay (HOSPITAL_COMMUNITY)
Admission: EM | Admit: 2016-10-17 | Discharge: 2016-10-22 | DRG: 394 | Disposition: A | Discharge status: Rehabilitation Facility | Payer: Medicare Other | Attending: Neurological Surgery | Admitting: Neurological Surgery

## 2016-10-17 DIAGNOSIS — R490 Dysphonia: Secondary | ICD-10-CM | POA: Diagnosis present

## 2016-10-17 DIAGNOSIS — I10 Essential (primary) hypertension: Secondary | ICD-10-CM | POA: Diagnosis present

## 2016-10-17 DIAGNOSIS — R1313 Dysphagia, pharyngeal phase: Secondary | ICD-10-CM | POA: Diagnosis not present

## 2016-10-17 DIAGNOSIS — I2510 Atherosclerotic heart disease of native coronary artery without angina pectoris: Secondary | ICD-10-CM | POA: Diagnosis not present

## 2016-10-17 DIAGNOSIS — Z981 Arthrodesis status: Secondary | ICD-10-CM

## 2016-10-17 DIAGNOSIS — R531 Weakness: Secondary | ICD-10-CM

## 2016-10-17 DIAGNOSIS — Z8261 Family history of arthritis: Secondary | ICD-10-CM

## 2016-10-17 DIAGNOSIS — Z8249 Family history of ischemic heart disease and other diseases of the circulatory system: Secondary | ICD-10-CM | POA: Diagnosis not present

## 2016-10-17 DIAGNOSIS — Z7951 Long term (current) use of inhaled steroids: Secondary | ICD-10-CM

## 2016-10-17 DIAGNOSIS — Z809 Family history of malignant neoplasm, unspecified: Secondary | ICD-10-CM

## 2016-10-17 DIAGNOSIS — M1990 Unspecified osteoarthritis, unspecified site: Secondary | ICD-10-CM | POA: Diagnosis present

## 2016-10-17 DIAGNOSIS — Y793 Surgical instruments, materials and orthopedic devices (including sutures) associated with adverse incidents: Secondary | ICD-10-CM | POA: Diagnosis present

## 2016-10-17 DIAGNOSIS — E871 Hypo-osmolality and hyponatremia: Secondary | ICD-10-CM | POA: Diagnosis not present

## 2016-10-17 DIAGNOSIS — S199XXA Unspecified injury of neck, initial encounter: Secondary | ICD-10-CM | POA: Diagnosis not present

## 2016-10-17 DIAGNOSIS — R1312 Dysphagia, oropharyngeal phase: Secondary | ICD-10-CM

## 2016-10-17 DIAGNOSIS — Z7982 Long term (current) use of aspirin: Secondary | ICD-10-CM

## 2016-10-17 DIAGNOSIS — E785 Hyperlipidemia, unspecified: Secondary | ICD-10-CM | POA: Diagnosis present

## 2016-10-17 DIAGNOSIS — Z888 Allergy status to other drugs, medicaments and biological substances status: Secondary | ICD-10-CM | POA: Diagnosis not present

## 2016-10-17 DIAGNOSIS — Z885 Allergy status to narcotic agent status: Secondary | ICD-10-CM

## 2016-10-17 DIAGNOSIS — I739 Peripheral vascular disease, unspecified: Secondary | ICD-10-CM | POA: Diagnosis not present

## 2016-10-17 DIAGNOSIS — K9189 Other postprocedural complications and disorders of digestive system: Principal | ICD-10-CM | POA: Diagnosis present

## 2016-10-17 DIAGNOSIS — W19XXXA Unspecified fall, initial encounter: Secondary | ICD-10-CM

## 2016-10-17 DIAGNOSIS — D72829 Elevated white blood cell count, unspecified: Secondary | ICD-10-CM

## 2016-10-17 DIAGNOSIS — R251 Tremor, unspecified: Secondary | ICD-10-CM | POA: Diagnosis present

## 2016-10-17 DIAGNOSIS — R1310 Dysphagia, unspecified: Secondary | ICD-10-CM

## 2016-10-17 DIAGNOSIS — Z951 Presence of aortocoronary bypass graft: Secondary | ICD-10-CM | POA: Diagnosis not present

## 2016-10-17 DIAGNOSIS — F17210 Nicotine dependence, cigarettes, uncomplicated: Secondary | ICD-10-CM | POA: Diagnosis present

## 2016-10-17 DIAGNOSIS — R42 Dizziness and giddiness: Secondary | ICD-10-CM | POA: Diagnosis not present

## 2016-10-17 DIAGNOSIS — I25810 Atherosclerosis of coronary artery bypass graft(s) without angina pectoris: Secondary | ICD-10-CM

## 2016-10-17 DIAGNOSIS — Y838 Other surgical procedures as the cause of abnormal reaction of the patient, or of later complication, without mention of misadventure at the time of the procedure: Secondary | ICD-10-CM | POA: Diagnosis present

## 2016-10-17 DIAGNOSIS — J449 Chronic obstructive pulmonary disease, unspecified: Secondary | ICD-10-CM | POA: Diagnosis present

## 2016-10-17 DIAGNOSIS — R030 Elevated blood-pressure reading, without diagnosis of hypertension: Secondary | ICD-10-CM | POA: Diagnosis not present

## 2016-10-17 DIAGNOSIS — L570 Actinic keratosis: Secondary | ICD-10-CM | POA: Diagnosis not present

## 2016-10-17 DIAGNOSIS — G959 Disease of spinal cord, unspecified: Secondary | ICD-10-CM

## 2016-10-17 DIAGNOSIS — F17200 Nicotine dependence, unspecified, uncomplicated: Secondary | ICD-10-CM | POA: Diagnosis present

## 2016-10-17 LAB — BASIC METABOLIC PANEL
Anion gap: 13 (ref 5–15)
BUN: 7 mg/dL (ref 6–20)
CHLORIDE: 97 mmol/L — AB (ref 101–111)
CO2: 21 mmol/L — ABNORMAL LOW (ref 22–32)
Calcium: 8.8 mg/dL — ABNORMAL LOW (ref 8.9–10.3)
Creatinine, Ser: 0.71 mg/dL (ref 0.61–1.24)
GFR calc non Af Amer: >60 mL/min (ref >60)
Glucose, Bld: 95 mg/dL (ref 65–99)
POTASSIUM: 4.1 mmol/L (ref 3.5–5.1)
SODIUM: 131 mmol/L — AB (ref 135–145)

## 2016-10-17 LAB — CBC WITH DIFFERENTIAL/PLATELET
Basophils Absolute: 0 10*3/uL (ref 0.0–0.1)
Basophils Relative: 0 %
Eosinophils Absolute: 0 10*3/uL (ref 0.0–0.7)
Eosinophils Relative: 0 %
HCT: 43.3 % (ref 39.0–52.0)
Hemoglobin: 15.1 g/dL (ref 13.0–17.0)
Lymphocytes Relative: 16 %
Lymphs Abs: 1.9 10*3/uL (ref 0.7–4.0)
MCH: 31.9 pg (ref 26.0–34.0)
MCHC: 34.9 g/dL (ref 30.0–36.0)
MCV: 91.4 fL (ref 78.0–100.0)
Monocytes Absolute: 1.1 10*3/uL — ABNORMAL HIGH (ref 0.1–1.0)
Monocytes Relative: 9 %
Neutro Abs: 9.3 10*3/uL — ABNORMAL HIGH (ref 1.7–7.7)
Neutrophils Relative %: 75 %
Platelets: 322 10*3/uL (ref 150–400)
RBC: 4.74 MIL/uL (ref 4.22–5.81)
RDW: 12.8 % (ref 11.5–15.5)
WBC: 12.4 10*3/uL — ABNORMAL HIGH (ref 4.0–10.5)

## 2016-10-17 MED ORDER — ZOLPIDEM TARTRATE 5 MG PO TABS: 5.0000 mg | ORAL_TABLET | Freq: Every evening | ORAL | Status: DC | PRN

## 2016-10-17 MED ORDER — GADOBENATE DIMEGLUMINE 529 MG/ML IV SOLN
20.0000 mL | Freq: Once | INTRAVENOUS | Status: AC | PRN
  Administered 2016-10-17: 20 mL via INTRAVENOUS

## 2016-10-17 MED ORDER — DEXAMETHASONE SODIUM PHOSPHATE 10 MG/ML IJ SOLN
10.0000 mg | Freq: Once | INTRAMUSCULAR | Status: AC
  Administered 2016-10-17: 10 mg via INTRAVENOUS
  Filled 2016-10-17: qty 1

## 2016-10-17 MED ORDER — HYDROCODONE-ACETAMINOPHEN 5-325 MG PO TABS: 1.0000 | ORAL_TABLET | ORAL | Status: DC | PRN

## 2016-10-17 MED ORDER — FUROSEMIDE 20 MG PO TABS
20.0000 mg | ORAL_TABLET | Freq: Two times a day (BID) | ORAL | Status: DC
  Administered 2016-10-21 – 2016-10-22 (×3): 20 mg via ORAL
  Filled 2016-10-17 (×5): qty 1

## 2016-10-17 MED ORDER — VITAMIN D 1000 UNITS PO TABS
1000.0000 [IU] | ORAL_TABLET | Freq: Every day | ORAL | Status: DC
  Administered 2016-10-22: 1000 [IU] via ORAL
  Filled 2016-10-17 (×3): qty 1

## 2016-10-17 MED ORDER — VITAMIN B-12 1000 MCG PO TABS
1500.0000 ug | ORAL_TABLET | Freq: Every day | ORAL | Status: DC
  Administered 2016-10-22: 10:00:00 1500 ug via ORAL
  Filled 2016-10-17 (×3): qty 5
  Filled 2016-10-17: qty 1

## 2016-10-17 MED ORDER — AMLODIPINE BESYLATE 5 MG PO TABS
5.0000 mg | ORAL_TABLET | Freq: Every day | ORAL | Status: DC
  Administered 2016-10-22: 5 mg via ORAL
  Filled 2016-10-17 (×3): qty 1

## 2016-10-17 MED ORDER — VITAMIN D (ERGOCALCIFEROL) 1.25 MG (50000 UNIT) PO CAPS: 50000.0000 [IU] | ORAL_CAPSULE | ORAL | Status: DC

## 2016-10-17 MED ORDER — ONDANSETRON HCL 4 MG PO TABS: 4.0000 mg | ORAL_TABLET | Freq: Three times a day (TID) | ORAL | Status: DC | PRN

## 2016-10-17 MED ORDER — IPRATROPIUM-ALBUTEROL 0.5-2.5 (3) MG/3ML IN SOLN: 3.0000 mL | Freq: Four times a day (QID) | RESPIRATORY_TRACT | Status: DC | PRN

## 2016-10-17 MED ORDER — SODIUM CHLORIDE 0.9% FLUSH
3.0000 mL | Freq: Two times a day (BID) | INTRAVENOUS | Status: DC
  Administered 2016-10-17 – 2016-10-22 (×7): 3 mL via INTRAVENOUS

## 2016-10-17 MED ORDER — ONDANSETRON HCL 4 MG/2ML IJ SOLN: 4.0000 mg | Freq: Four times a day (QID) | INTRAMUSCULAR | Status: DC | PRN

## 2016-10-17 MED ORDER — VITAMIN B-6 100 MG PO TABS
100.0000 mg | ORAL_TABLET | Freq: Every day | ORAL | Status: DC
  Administered 2016-10-22: 100 mg via ORAL
  Filled 2016-10-17 (×5): qty 1

## 2016-10-17 MED ORDER — SENNA 8.6 MG PO TABS
1.0000 | ORAL_TABLET | Freq: Two times a day (BID) | ORAL | Status: DC
  Administered 2016-10-22: 8.6 mg via ORAL
  Filled 2016-10-17 (×7): qty 1

## 2016-10-17 MED ORDER — LOSARTAN POTASSIUM 50 MG PO TABS
100.0000 mg | ORAL_TABLET | Freq: Every day | ORAL | Status: DC
  Administered 2016-10-22: 100 mg via ORAL
  Filled 2016-10-17 (×3): qty 2

## 2016-10-17 MED ORDER — SODIUM CHLORIDE 0.9 % IV BOLUS (SEPSIS)
1000.0000 mL | Freq: Once | INTRAVENOUS | Status: AC
  Administered 2016-10-17: 1000 mL via INTRAVENOUS

## 2016-10-17 MED ORDER — ACETAMINOPHEN 325 MG PO TABS: 650.0000 mg | ORAL_TABLET | Freq: Four times a day (QID) | ORAL | Status: DC | PRN

## 2016-10-17 MED ORDER — IPRATROPIUM-ALBUTEROL 20-100 MCG/ACT IN AERS: 2.0000 | INHALATION_SPRAY | Freq: Four times a day (QID) | RESPIRATORY_TRACT | Status: DC | PRN

## 2016-10-17 MED ORDER — MAGNESIUM CITRATE PO SOLN
1.0000 | Freq: Once | ORAL | Status: DC | PRN
Start: 2016-10-17 — End: 2016-10-22

## 2016-10-17 MED ORDER — ONDANSETRON HCL 4 MG PO TABS: 4.0000 mg | ORAL_TABLET | Freq: Four times a day (QID) | ORAL | Status: DC | PRN

## 2016-10-17 MED ORDER — DEXAMETHASONE SODIUM PHOSPHATE 4 MG/ML IJ SOLN
4.0000 mg | Freq: Four times a day (QID) | INTRAMUSCULAR | Status: DC
  Administered 2016-10-18 – 2016-10-22 (×19): 4 mg via INTRAVENOUS
  Filled 2016-10-17 (×19): qty 1

## 2016-10-17 MED ORDER — BISACODYL 5 MG PO TBEC
5.0000 mg | DELAYED_RELEASE_TABLET | Freq: Every day | ORAL | Status: DC | PRN
Start: 2016-10-17 — End: 2016-10-22
  Filled 2016-10-17: qty 1

## 2016-10-17 MED ORDER — ASPIRIN EC 81 MG PO TBEC
81.0000 mg | DELAYED_RELEASE_TABLET | Freq: Every day | ORAL | Status: DC
  Administered 2016-10-22: 81 mg via ORAL
  Filled 2016-10-17 (×3): qty 1

## 2016-10-17 MED ORDER — POLYETHYLENE GLYCOL 3350 17 G PO PACK: 17.0000 g | PACK | Freq: Every day | ORAL | Status: DC | PRN

## 2016-10-17 MED ORDER — GABAPENTIN 100 MG PO CAPS
100.0000 mg | ORAL_CAPSULE | Freq: Three times a day (TID) | ORAL | Status: DC
  Administered 2016-10-21 – 2016-10-22 (×4): 100 mg via ORAL
  Filled 2016-10-17 (×8): qty 1

## 2016-10-17 MED ORDER — SODIUM CHLORIDE 0.9 % IV SOLN: 250.0000 mL | INTRAVENOUS | Status: DC | PRN

## 2016-10-17 MED ORDER — SODIUM CHLORIDE 0.9 % IV SOLN
INTRAVENOUS | Status: DC
  Administered 2016-10-18 – 2016-10-22 (×6): via INTRAVENOUS

## 2016-10-17 NOTE — ED Notes (Signed)
Pt had an incontinent of urine episode, pt cleaned and linens changed, external urine catheter pull out by pt.

## 2016-10-17 NOTE — ED Provider Notes (Signed)
I received this patient in signout from Dr. Wilson Singer. We were awaiting MRI cervical spine. WBC mildly elevated at 12.4. I was contacted by radiologist due to MRI findings concerning for abscess. MRI showed large prevertebral fluid collection with rim enhancement worrisome for abscess versus hematoma. Contacted neurosurgery and discussed with PA for Dr. Cyndy Freeze. I appreciate their assistance. They will admit patient for observation.   Sharlett Iles, MD 10/17/16 2006

## 2016-10-17 NOTE — Progress Notes (Signed)
Patient admitted to 5w09, pt awake, oriented and able to follow commands. Pt reports some numbness and weakness in all 4 extremities, and that it is worse in upper ext. Friend at bedside, vitals stable. Will continue to monitor.
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
## 2016-10-17 NOTE — ED Notes (Signed)
Pt going to MRI

## 2016-10-17 NOTE — ED Triage Notes (Signed)
Pt BIB EMS from home where pt was found on the ground after a fall 6 hours prior. Pt has hx of neuropathy, states "my legs jumped and that's why I fell." Pt c/o rt knee and rt hip pain. Denies dizziness or LOC. Pt was here 1 week ago for C3,C4 surgery; states he has "not been able to eat and felling weak from it." Pt  did not receive daily BP meds. Resp e/u; A&Ox4;  NAD noted at this time.

## 2016-10-18 ENCOUNTER — Observation Stay (HOSPITAL_COMMUNITY): Payer: Medicare Other

## 2016-10-18 MED ORDER — METHYLPREDNISOLONE 4 MG PO TBPK: ORAL_TABLET | ORAL | 0 refills | Status: DC

## 2016-10-18 NOTE — Progress Notes (Signed)
Modified Barium Swallow Progress Note  Patient Details  Name: Joe [name] MRN: [id number] Date of Birth: 14-May-1946  Today's Date: 10/18/2016  Modified Barium Swallow completed.  Full report located under Chart Review in the Imaging Section.  Brief recommendations include the following:  Clinical Impression  Patient presents with severe pharyngeal dysphagia and severe risk for aspiration in the setting of apparent tissue edema s/p ACDF. Pharyngeal space is narrow, impeding epiglottic deflection and thus airway closure, and results in moderate-severe residue in the valleculae and along posterior pharyngeal wall with all consistencies trialed, which deeply penetrates the laryngeal vestibule during and after the swallow, with intermittent sensed and silent aspiration of material. Cued cough is effective for airway clearance in ~80% of trials. Effortful dry swallow leads to additional penetration/aspiration of residue. Other strategies trialed including chin tuck and head turn which were not effective in facilitating epiglottic closure, reducing residue, penetration or aspiration. Recommend pt remain NPO, may have water and ice chips after oral care. Educated patient in strategy of swallow, immediate cough, and suction to remove material to use when taking water or ice. Patient will likely require alternative means of nutrition/hydration and SLP follow-up.   Swallow Evaluation Recommendations   Recommended Consults: Consider ENT evaluation   SLP Diet Recommendations: NPO;Alternative means - temporary;Free water protocol after oral care;Ice chips PRN after oral care   Liquid Administration via: Cup   Medication Administration: Via alternative means   Supervision: Intermittent supervision to cue for compensatory strategies   Compensations: Slow rate;Small sips/bites;Hard cough after swallow;Other (Comment) (suction after hard cough)   Postural Changes: Seated upright at 90 degrees   Oral  Care Recommendations: Oral care QID     Deneise Lever, [place] CF-SLP Speech-Language Pathologist [phone]   [name] 10/18/2016,11:06 AM
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
## 2016-10-18 NOTE — Progress Notes (Signed)
Pt seen and examined.  No issues overnight. Had swallow study performed earlier today Per Swallow eval:   Clinical Impression             Patient presents with severe pharyngeal dysphagia and severe risk for aspiration in the setting of apparent tissue edema s/p ACDF. Pharyngeal space is narrow, impeding epiglottic deflection and thus airway closure, and results in moderate-severe residue in the valleculae and along posterior pharyngeal wall with all consistencies trialed, which deeply penetrates the laryngeal vestibule during and after the swallow, with intermittent sensed and silent aspiration of material. Cued cough is effective for airway clearance in ~80% of trials. Effortful dry swallow leads to additional penetration/aspiration of residue. Other strategies trialed including chin tuck and head turn which were not effective in facilitating epiglottic closure, reducing residue, penetration or aspiration. Recommend pt remain NPO, may have water and ice chips after oral care. Educated patient in strategy of swallow, immediate cough, and suction to remove material to use when taking water or ice. Patient will likely require alternative means of nutrition/hydration and SLP follow-up.  Denies shortness of breath.   EXAM: Temp:  [97.7 F (36.5 C)-98.4 F (36.9 C)] 98.4 F (36.9 C) (02/25 0515) Pulse Rate:  [71-98] 98 (02/25 0515) Resp:  [18-22] 19 (02/25 0515) BP: (135-169)/(61-80) 157/76 (02/25 0515) SpO2:  [94 %-98 %] 96 % (02/25 0515) Weight:  [118.6 kg (261 lb 8 oz)-119.7 kg (264 lb)] 118.6 kg (261 lb 8 oz) (02/24 2154) Intake/Output      02/24 0701 - 02/25 0700 02/25 0701 - 02/26 0700   IV Piggyback 1000    Total Intake(mL/kg) 1000 (8.4)    Urine (mL/kg/hr) 400    Total Output 400     Net +600            Appears well. Nontoxic Awake and alert Follows commands throughout Strength appropriate Swelling without redness or tenderness of surgical site. No drainage or signs of  infection.   Plan - Swallow study shows that the mass, which is most likely a hematoma, is causing "severe dysphagia" -Discussed treatment options at this time for helping with this including surgical intervention vs NG tube placement for nutrition. Counseled at length. Patient would like to try NG tube before another surgery.   - continue steroids - Consult dietician for nutrition

## 2016-10-18 NOTE — Evaluation (Signed)
Physical Therapy Evaluation Patient Details Name: Joe [name] MRN: [id number] DOB: 1946-01-21 Today's Date: 10/18/2016   History of Present Illness  Pt is a 71 y.o. male who presented to the ED with B hand numbness and B upper and lower body weakness with difficulty ambulating. Pt has a PMH significant for COPD, tobacco abuse, hypertension, hypogonadism, vitamin D deficiency, essential tremot, CAD s/p CABG, peripheral vascular disease, obesity, and hx of prior cervical surgery on 10-09-16.  Clinical Impression  Pt presents with quadraplegia today - has weakness in all four extemities that affects his function. I see from past notes that pt was walking 200 feet a week ago with PT. Today pt unable to step away from the bed as arms and legs unable to hold him up safely.  Will need +2 to further assess this for pt and staff safety.  I have recommended Rehab or short term SNF for therapy.  Pt not agreeable to this - he wants to go home with intermittent family and friend care but he is too involved to do this safely.  He needs more than [place] care.  Pt will benefit from continued PT services to help increase his activity level and increase his safety    Follow Up Recommendations SNF;Supervision/Assistance - 24 hour    Equipment Recommendations  None recommended by PT    Recommendations for Other Services Rehab consult;OT consult     Precautions / Restrictions Precautions Precautions: Fall Precaution Comments: pt reports he never had a neck brace following surgery Restrictions Weight Bearing Restrictions: No      Mobility  Bed Mobility Overal bed mobility: Needs Assistance Bed Mobility: Rolling;Sidelying to Sit;Sit to Sidelying Rolling: Mod assist Sidelying to sit: Mod assist     Sit to sidelying: Mod assist General bed mobility comments: pt needed cues for technque and help getting into sitting. Pt had hard time using his arms to bear weight to scoot forward.  pt has hard tiem  holding himself up intiitally - wanting to fall backwards.  he was unsafe to leave sitting EOB by himself  Transfers Overall transfer level: Needs assistance Equipment used: Rolling walker (2 wheeled) Transfers: Sit to/from Stand Sit to Stand: Mod assist         General transfer comment: pt stood wtih mod assist - i had to help lift hips off bed and block right knee in stnading.  Pt stood EOB twice for about 30 seconds each time.  pt did some weight shifting side to side but knees would have buckled if he tried to walk.  Staff came to get him for swallow test so didnt get him into chair today.  would need +2 assist to safely transfer with risk of knees buckling and unable to use UEs to bear weight on RW effectively  Ambulation/Gait                Stairs            Wheelchair Mobility    Modified Rankin (Stroke Patients Only)       Balance Overall balance assessment: Independent   Sitting balance-Leahy Scale: Fair       Standing balance-Leahy Scale: Poor                               Pertinent Vitals/Pain Pain Assessment: 0-10 Pain Score: 2  Faces Pain Scale: No hurt Pain Location: neck pain Pain Descriptors / Indicators: Sore Pain  Intervention(s): Monitored during session    Home Living Family/patient expects to be discharged to:: Private residence Living Arrangements: Alone Available Help at Discharge: Family;Available PRN/intermittently Type of Home: House Home Access: Stairs to enter   Entrance Stairs-Number of Steps: 2 Home Layout: One level Home Equipment: Walker - 4 wheels;Wheelchair - manual (Pt reports he recently got the wheelchair)      Prior Function           Comments: Pt reports he was independent before neck surgery and was doing good initially and then increased weakness.     Hand Dominance   Dominant Hand: Right    Extremity/Trunk Assessment   Upper Extremity Assessment RUE Coordination: decreased fine  motor;decreased gross motor LUE Coordination: decreased gross motor    Lower Extremity Assessment Lower Extremity Assessment: LLE deficits/detail LLE Deficits / Details: pt sitting EOB - right leg 20 degrees from full knee extension, right leg 10 degrees from full active knee extension.  pt wtih 3-/5 strength throughout grossly    Cervical / Trunk Assessment Cervical / Trunk Assessment: Normal  Communication   Communication: No difficulties  Cognition Arousal/Alertness: Awake/alert Behavior During Therapy: WFL for tasks assessed/performed Overall Cognitive Status: Within Functional Limits for tasks assessed                 General Comments: Pt required increased time for processing and demonstrates decreased safety awareness.     General Comments      Exercises     Assessment/Plan    PT Assessment Patient needs continued PT services  PT Problem List Decreased strength;Decreased activity tolerance;Decreased balance;Decreased mobility;Decreased safety awareness;Decreased knowledge of precautions       PT Treatment Interventions DME instruction;Gait training;Functional mobility training;Therapeutic activities;Therapeutic exercise;Balance training;Patient/family education    PT Goals (Current goals can be found in the Care Plan section)  Acute Rehab PT Goals Patient Stated Goal: to get stronger - wants to eat and drink PT Goal Formulation: With patient Time For Goal Achievement: 10/31/16 Potential to Achieve Goals: Good    Frequency Min 3X/week   Barriers to discharge Decreased caregiver support (pt lives alone - said family will help but no one available 24/7)      Co-evaluation               End of Session Equipment Utilized During Treatment: Gait belt Activity Tolerance: Patient limited by fatigue Patient left: in bed (leaving in bed to go to swallow study) Nurse Communication: Mobility status PT Visit Diagnosis: Unsteadiness on feet (R26.81);Muscle  weakness (generalized) (M62.81)    Functional Assessment Tool Used (Outpatient Only): Clinical judgement Functional Limitation: Mobility: Walking and moving around Mobility: Walking and Moving Around Current Status [phone]): At least 60 percent but less than 80 percent impaired, limited or restricted Mobility: Walking and Moving Around Goal Status [phone]): At least 20 percent but less than 40 percent impaired, limited or restricted    Time: 0920-0950 PT Time Calculation (min) (ACUTE ONLY): 30 min   Charges:   PT Evaluation $PT Eval Moderate Complexity: 1 Procedure PT Treatments $Therapeutic Activity: 8-22 mins   PT G Codes:   PT G-Codes **NOT FOR INPATIENT CLASS** Functional Assessment Tool Used (Outpatient Only): Clinical judgement Functional Limitation: Mobility: Walking and moving around Mobility: Walking and Moving Around Current Status [id number]): At least 60 percent but less than 80 percent impaired, limited or restricted Mobility: Walking and Moving Around Goal Status [phone]): At least 20 percent but less than 40 percent impaired, limited or restricted  Loyal Buba 10/18/2016, 10:00 AM 10/18/2016   Rande Lawman, PT

## 2016-10-18 NOTE — Progress Notes (Signed)
Pt failed modified barium swallow and is NPO. MD made aware and asked to change medications to IV.

## 2016-10-19 ENCOUNTER — Encounter (HOSPITAL_COMMUNITY)
Admission: EM | Disposition: A | Discharge status: Rehabilitation Facility | Payer: Self-pay | Source: Home / Self Care | Attending: Neurological Surgery

## 2016-10-19 DIAGNOSIS — F17210 Nicotine dependence, cigarettes, uncomplicated: Secondary | ICD-10-CM | POA: Diagnosis present

## 2016-10-19 DIAGNOSIS — Z888 Allergy status to other drugs, medicaments and biological substances status: Secondary | ICD-10-CM | POA: Diagnosis not present

## 2016-10-19 DIAGNOSIS — Z79899 Other long term (current) drug therapy: Secondary | ICD-10-CM | POA: Diagnosis not present

## 2016-10-19 DIAGNOSIS — F17200 Nicotine dependence, unspecified, uncomplicated: Secondary | ICD-10-CM | POA: Diagnosis present

## 2016-10-19 DIAGNOSIS — R531 Weakness: Secondary | ICD-10-CM

## 2016-10-19 DIAGNOSIS — I739 Peripheral vascular disease, unspecified: Secondary | ICD-10-CM | POA: Diagnosis present

## 2016-10-19 DIAGNOSIS — G8250 Quadriplegia, unspecified: Secondary | ICD-10-CM | POA: Diagnosis not present

## 2016-10-19 DIAGNOSIS — Z7902 Long term (current) use of antithrombotics/antiplatelets: Secondary | ICD-10-CM | POA: Diagnosis not present

## 2016-10-19 DIAGNOSIS — Z22322 Carrier or suspected carrier of Methicillin resistant Staphylococcus aureus: Secondary | ICD-10-CM | POA: Diagnosis not present

## 2016-10-19 DIAGNOSIS — G959 Disease of spinal cord, unspecified: Secondary | ICD-10-CM | POA: Diagnosis not present

## 2016-10-19 DIAGNOSIS — Y793 Surgical instruments, materials and orthopedic devices (including sutures) associated with adverse incidents: Secondary | ICD-10-CM | POA: Diagnosis present

## 2016-10-19 DIAGNOSIS — Z885 Allergy status to narcotic agent status: Secondary | ICD-10-CM | POA: Diagnosis not present

## 2016-10-19 DIAGNOSIS — D72829 Elevated white blood cell count, unspecified: Secondary | ICD-10-CM | POA: Diagnosis not present

## 2016-10-19 DIAGNOSIS — K219 Gastro-esophageal reflux disease without esophagitis: Secondary | ICD-10-CM | POA: Diagnosis not present

## 2016-10-19 DIAGNOSIS — M1990 Unspecified osteoarthritis, unspecified site: Secondary | ICD-10-CM | POA: Diagnosis present

## 2016-10-19 DIAGNOSIS — E871 Hypo-osmolality and hyponatremia: Secondary | ICD-10-CM

## 2016-10-19 DIAGNOSIS — R251 Tremor, unspecified: Secondary | ICD-10-CM | POA: Diagnosis not present

## 2016-10-19 DIAGNOSIS — Z8249 Family history of ischemic heart disease and other diseases of the circulatory system: Secondary | ICD-10-CM | POA: Diagnosis not present

## 2016-10-19 DIAGNOSIS — K9189 Other postprocedural complications and disorders of digestive system: Secondary | ICD-10-CM | POA: Diagnosis present

## 2016-10-19 DIAGNOSIS — R1312 Dysphagia, oropharyngeal phase: Secondary | ICD-10-CM | POA: Diagnosis not present

## 2016-10-19 DIAGNOSIS — R1313 Dysphagia, pharyngeal phase: Secondary | ICD-10-CM | POA: Diagnosis present

## 2016-10-19 DIAGNOSIS — R609 Edema, unspecified: Secondary | ICD-10-CM | POA: Diagnosis not present

## 2016-10-19 DIAGNOSIS — Z951 Presence of aortocoronary bypass graft: Secondary | ICD-10-CM | POA: Diagnosis not present

## 2016-10-19 DIAGNOSIS — Z7982 Long term (current) use of aspirin: Secondary | ICD-10-CM | POA: Diagnosis not present

## 2016-10-19 DIAGNOSIS — Z7951 Long term (current) use of inhaled steroids: Secondary | ICD-10-CM | POA: Diagnosis not present

## 2016-10-19 DIAGNOSIS — L570 Actinic keratosis: Secondary | ICD-10-CM | POA: Diagnosis present

## 2016-10-19 DIAGNOSIS — I2510 Atherosclerotic heart disease of native coronary artery without angina pectoris: Secondary | ICD-10-CM | POA: Diagnosis not present

## 2016-10-19 DIAGNOSIS — R42 Dizziness and giddiness: Secondary | ICD-10-CM | POA: Diagnosis not present

## 2016-10-19 DIAGNOSIS — K5900 Constipation, unspecified: Secondary | ICD-10-CM | POA: Diagnosis not present

## 2016-10-19 DIAGNOSIS — G8929 Other chronic pain: Secondary | ICD-10-CM | POA: Diagnosis not present

## 2016-10-19 DIAGNOSIS — Z8261 Family history of arthritis: Secondary | ICD-10-CM | POA: Diagnosis not present

## 2016-10-19 DIAGNOSIS — R1310 Dysphagia, unspecified: Secondary | ICD-10-CM

## 2016-10-19 DIAGNOSIS — N319 Neuromuscular dysfunction of bladder, unspecified: Secondary | ICD-10-CM | POA: Diagnosis not present

## 2016-10-19 DIAGNOSIS — K5901 Slow transit constipation: Secondary | ICD-10-CM | POA: Diagnosis not present

## 2016-10-19 DIAGNOSIS — I10 Essential (primary) hypertension: Secondary | ICD-10-CM | POA: Diagnosis not present

## 2016-10-19 DIAGNOSIS — Z981 Arthrodesis status: Secondary | ICD-10-CM | POA: Diagnosis not present

## 2016-10-19 DIAGNOSIS — J449 Chronic obstructive pulmonary disease, unspecified: Secondary | ICD-10-CM | POA: Diagnosis not present

## 2016-10-19 DIAGNOSIS — Z809 Family history of malignant neoplasm, unspecified: Secondary | ICD-10-CM | POA: Diagnosis not present

## 2016-10-19 DIAGNOSIS — E785 Hyperlipidemia, unspecified: Secondary | ICD-10-CM | POA: Diagnosis not present

## 2016-10-19 DIAGNOSIS — K649 Unspecified hemorrhoids: Secondary | ICD-10-CM | POA: Diagnosis not present

## 2016-10-19 DIAGNOSIS — Y838 Other surgical procedures as the cause of abnormal reaction of the patient, or of later complication, without mention of misadventure at the time of the procedure: Secondary | ICD-10-CM | POA: Diagnosis present

## 2016-10-19 DIAGNOSIS — I25810 Atherosclerosis of coronary artery bypass graft(s) without angina pectoris: Secondary | ICD-10-CM

## 2016-10-19 DIAGNOSIS — K592 Neurogenic bowel, not elsewhere classified: Secondary | ICD-10-CM | POA: Diagnosis not present

## 2016-10-19 DIAGNOSIS — J329 Chronic sinusitis, unspecified: Secondary | ICD-10-CM | POA: Diagnosis not present

## 2016-10-19 SURGERY — EVACUATION OF CERVICAL HEMATOMA
Anesthesia: General

## 2016-10-19 MED ORDER — PROPOFOL 10 MG/ML IV BOLUS
INTRAVENOUS | Status: AC
  Filled 2016-10-19: qty 20

## 2016-10-19 MED ORDER — FENTANYL CITRATE (PF) 100 MCG/2ML IJ SOLN
INTRAMUSCULAR | Status: AC
  Filled 2016-10-19: qty 4

## 2016-10-19 MED ORDER — AMPICILLIN-SULBACTAM SODIUM 1.5 (1-0.5) G IJ SOLR
1.5000 g | Freq: Four times a day (QID) | INTRAMUSCULAR | Status: DC
  Administered 2016-10-19 – 2016-10-22 (×11): 1.5 g via INTRAVENOUS
  Filled 2016-10-19 (×14): qty 1.5

## 2016-10-19 MED ORDER — OXYMETAZOLINE HCL 0.05 % NA SOLN
1.0000 | Freq: Two times a day (BID) | NASAL | Status: DC
  Administered 2016-10-19 – 2016-10-22 (×7): 1 via NASAL
  Filled 2016-10-19: qty 15

## 2016-10-19 MED ORDER — MIDAZOLAM HCL 2 MG/2ML IJ SOLN
INTRAMUSCULAR | Status: AC
  Filled 2016-10-19: qty 2

## 2016-10-19 NOTE — Care Management Obs Status (Signed)
[facility] NOTIFICATION   Patient Details  Name: Joe [name] MRN: [id number] Date of Birth: [birth year]   Medicare Observation Status Notification Given:  Yes    Sharin Mons, RN 10/19/2016, 2:14 PM

## 2016-10-19 NOTE — Progress Notes (Signed)
Initial Nutrition Assessment  DOCUMENTATION CODES:   Obesity unspecified  INTERVENTION:   If feeding tube able to be placed, Recommend Jevity at goal rate of 94ml/hr. Recommend initiate at 45 ml/hr and advance by 80ml/hr q12 hrs until goal rate is reached.   Recommend Prostat SF TID which provides 15g protein and 100kcal per serving  Recommend free water flushes 282ml q 6 hrs to meet 14ml/kcal/day estimated needs as Jevity is high in fiber  Regimen provides 2028kcal/day (85-97% estimated needs) 125g/day protein (89-107% estimated needs) 26g fiber, 203ml free water  NUTRITION DIAGNOSIS:   Inadequate oral intake related to inability to eat, dysphagia as evidenced by NPO status.  GOAL:   Patient will meet greater than or equal to 90% of their needs  MONITOR:   Diet advancement, Labs, Weight trends, Skin  REASON FOR ASSESSMENT:   Consult Assessment of nutrition requirement/status  ASSESSMENT:   Pt is a 71 y.o. male who presented to the ED with B hand numbness and B upper and lower body weakness with difficulty ambulating. Pt has a PMH significant for COPD, tobacco abuse, hypertension, hypogonadism, vitamin D deficiency, essential tremot, CAD s/p CABG, peripheral vascular disease, obesity, and hx of prior cervical surgery on 10-09-16.   Pt with severe pharyngeal dysphagia and severe risk for aspiration in the setting of apparent tissue edema s/p ACDF. Per MD note, swallow study shows a mass, which is most likely a hematoma, which is causing "severe dysphagia". Unable to place cortrak tube today after multiple attempts. Pt with large amounts of phlegm and uncontrolled coughing. Pt will likely need to have tube placed in radiology. Recommendations for tube feeds above if tube able to be placed. Pt is hungry and reports a good appetite today but failed multiple swallow evaluations. Per chart, pt has lost 9lbs(3%) in one week. This is severe.   Medications reviewed and include: aspirin,  Vit D, cyanocobalmin, dexamethasone, lasix  Labs reviewed: Na 131(L), Cl 97(L), Ca 8.8(L) Wbc- 12.4(H)  Nutrition-Focused physical exam completed. Findings are no fat depletion, no muscle depletion, and mild edema in neck.   Diet Order:  Diet general Diet NPO time specified Except for: Ice Chips, Other (See Comments)  Skin:  Wound (see comment) (neck incison )  Last BM:  2/24  Height:   Ht Readings from Last 1 Encounters:  10/17/16 5\' 9"  (1.753 m)    Weight:   Wt Readings from Last 1 Encounters:  10/19/16 258 lb 12.8 oz (117.4 kg)    Ideal Body Weight:  72.7 kg  BMI:  Body mass index is 38.22 kg/m.  Estimated Nutritional Needs:   Kcal:  2100-2400kcal/day   Protein:  117-140g/day   Fluid:  >2L/day   EDUCATION NEEDS:   No education needs identified at this time  Koleen Distance, RD, LDN Pager #[phone]

## 2016-10-19 NOTE — Progress Notes (Signed)
  Speech Language Pathology Treatment: Dysphagia  Patient Details Name: Joe [name] MRN: [id number] DOB: [birth year] Today's Date: 10/19/2016 Time: 1205-1220 SLP Time Calculation (min) (ACUTE ONLY): 15 min  Assessment / Plan / Recommendation Clinical Impression  Skilled treatment session focused on dysphagia goals. SLP facilitated session by providing skilled observation of pt consuming ice chips. Pt with intermittent wetness and required Mod A verbal cues for cough after swallow. Pt able to clear voice with cough. Education provided on need for oral care often, upright position and cough following bolus. Pt with concerns re: sinus infection. Concerns conveyed to nursing. Pt left upright with family members present.     HPI HPI: Joe [name] a 71 y.o.malewith past medical history notable for vertigo, tremor, PVD, COPD who presented to ER via ambulance for upper and lower extremity weakness. He is s/p ACDF at C3-4  10/09/2016. He reports symptoms began well before surgery and have not resolved. He also complains of hoarseness and sinus drainage. He states he has difficulties tolerating po when laying flat due to drainage from sinuses. MRI cervical spine findings concerning for abcess, fluid collection in left neck soft tissues; pt admitted for observation. No prior swallowing evaluations per chart.      SLP Plan  Continue with current plan of care       Recommendations  Diet recommendations: NPO Medication Administration: Via alternative means Compensations: Slow rate;Small sips/bites;Hard cough after swallow;Other (Comment) (With ice chips only) Postural Changes and/or Swallow Maneuvers: Seated upright 90 degrees                General recommendations: Rehab consult Oral Care Recommendations: Oral care QID Follow up Recommendations: Inpatient Rehab SLP Visit Diagnosis: Dysphagia, pharyngeal phase (R13.13) Plan: Continue with current plan of care       GO            Functional Assessment Tool Used: skilled clinical judgment Functional Limitations: Swallowing Swallow Current Status [id number]): At least 80 percent but less than 100 percent impaired, limited or restricted Swallow Goal Status [phone]): At least 60 percent but less than 80 percent impaired, limited or restricted   Joe [name] B. Rutherford Nail, M.S., CCC-SLP Speech-Language Pathologist  Kiahna Banghart 10/19/2016, 12:58 PM

## 2016-10-19 NOTE — Progress Notes (Addendum)
Pt seen and examined.  No NG tube placed since order and discussing yesterday with nursing.  No call was placed to notify neurosurgery of this. Pt is upset that he has not had NG tube placed and would like to know what is going on. Discussed with nursing. They are going to call me as soon as possible when they know what happened. Denies dizziness, chest pain, shortness of breath, new neurological symptoms.  Addendum Call was made to Dr Ronnald Ramp regarding this.
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
## 2016-10-19 NOTE — Progress Notes (Signed)
Rehab Admissions Coordinator Note:  Patient was screened by Retta Diones for appropriateness for an Inpatient Acute Rehab Consult.  At this time, we are recommending Inpatient Rehab consult.  Jodell Cipro M 10/19/2016, 3:21 PM  I can be reached at [phone].

## 2016-10-19 NOTE — Progress Notes (Signed)
Patient family came to desk and also called concerned why patient had not received feeding tube yet. Per [facility] policy a special team places those Monday through Friday. Me as charge called around the hospital including the [facility], Rapid Response and ICU units to ask if anyone was trained to place this tube. All answered no. Ellard Artis the primary nurse contacted the on call dr to inform and see if we could get IVF ordered until tomorrow when the team could try and place the tube. Family was notified about this. Joslyn Hy, MSN, RN, [employer]

## 2016-10-19 NOTE — Progress Notes (Addendum)
Physical Therapy Treatment Patient Details Name: Joe [name] MRN: [id number] DOB: Mar 18, 1946 Today's Date: 10/19/2016    History of Present Illness Pt is a 71 y.o. male who presented to the ED with B hand numbness and B upper and lower body weakness with difficulty ambulating. Pt has a PMH significant for COPD, tobacco abuse, hypertension, hypogonadism, vitamin D deficiency, essential tremot, CAD s/p CABG, peripheral vascular disease, obesity, and hx of prior cervical surgery on 10-09-16.    PT Comments    Pt performed increased activity with use of stedy frame.  Pt will likely require +2 assist without frame for support as he relies heavily on knee frame to block B knees.  Pt able to perform sit to stand and seated LE therapeutic exercises.  Continue to recommend rehab in a post acute setting at this time before return home to private residence.    Follow Up Recommendations  CIR     Equipment Recommendations  None recommended by PT    Recommendations for Other Services Rehab consult;OT consult     Precautions / Restrictions Precautions Precautions: Fall Precaution Comments: pt reports he never had a neck brace following surgery Restrictions Weight Bearing Restrictions: No    Mobility  Bed Mobility Overal bed mobility: Needs Assistance Bed Mobility: Rolling;Sidelying to Sit;Sit to Sidelying Rolling: Min assist Sidelying to sit: Mod assist       General bed mobility comments: Cues for technique, foot placement and hand placement to perform rolling and sitting edge of bed.  Pt required mod assist for trunk elevation.  Pt presents with decreased balance sitting edge of bed.    Transfers Overall transfer level: Needs assistance Equipment used:  (stedy frame) Transfers: Sit to/from Stand Sit to Stand: Min assist;Mod assist (min assist to pull into standing and mod assist to control descent to seated surface.  )         General transfer comment: Cues for hand  placement on cross bar to pull into standing.  Pt able to stand for 5 min with weight shifting.  Pt performed x2 trials.  Followed by repeated sit to stand x10 from elevated seated surface     Ambulation/Gait Ambulation/Gait assistance:  (Unable at this time due to weakness.  )               Stairs            Wheelchair Mobility    Modified Rankin (Stroke Patients Only)       Balance Overall balance assessment: Independent Sitting-balance support: Feet supported;No upper extremity supported Sitting balance-Leahy Scale: Poor (can tolerate brief periods without assistance.  )     Standing balance support: During functional activity;Bilateral upper extremity supported Standing balance-Leahy Scale: Poor Standing balance comment: In stedy frame with blocking of B LEs.                      Cognition Arousal/Alertness: Awake/alert Behavior During Therapy: WFL for tasks assessed/performed Overall Cognitive Status: Within Functional Limits for tasks assessed Area of Impairment: Safety/judgement;Memory     Memory: Decreased short-term memory   Safety/Judgement: Decreased awareness of deficits     General Comments: Pt required increased time for processing and demonstrates decreased safety awareness.     Exercises General Exercises - Lower Extremity Ankle Circles/Pumps: AROM;Both;10 reps;Supine Long Arc Quad: AROM;Both;10 reps;Seated Hip Flexion/Marching: AROM;Both;10 reps;Seated Mini-Sqauts:  (Repeated sit to stand from elevated surface 1x10 reps.  )    General Comments  Pertinent Vitals/Pain Pain Assessment: 0-10 Pain Score: 0-No pain Pain Intervention(s): Monitored during session    Home Living                      Prior Function            PT Goals (current goals can now be found in the care plan section) Acute Rehab PT Goals Patient Stated Goal: to get stronger - wants to eat and drink Potential to Achieve Goals:  Good Progress towards PT goals: Progressing toward goals    Frequency    Min 3X/week      PT Plan Current plan remains appropriate    Co-evaluation             End of Session Equipment Utilized During Treatment: Gait belt Activity Tolerance: Patient limited by fatigue Patient left: in chair;with family/visitor present;with chair alarm set;with call bell/phone within reach Nurse Communication: Mobility status PT Visit Diagnosis: Unsteadiness on feet (R26.81);Muscle weakness (generalized) (M62.81)     Time: [id number] PT Time Calculation (min) (ACUTE ONLY): 23 min  Charges:  $Therapeutic Exercise: 8-22 mins $Therapeutic Activity: 8-22 mins                    G Codes:       Cristela Blue 2016/10/22, 10:59 AM Governor Rooks, PTA pager [phone]

## 2016-10-19 NOTE — Progress Notes (Addendum)
NG tube placement was unsuccessful  There was talk of possible scheduling for surgery for removal, but at this time, both Dr Cyndy Freeze and Dr Annette Stable believe risks of surgery & intubation out weight benefits Pt reports sinus pressure, congestion, post nasal drip x1 month Reports long standing history of sinus infections Believes if he gets abx for sinus infection, he will be able to swallow better.  On Exam, he does have edematous turbinates with discharge with sinus tenderness. Called pharmacy for abx rec. Started on Unasyn q 6.  Continue steroids - swelling does appears to improving Consult Dietician for nutrition (second attempt) Follow up tomorrow - he is resistant to re-attempt at NG tube placement

## 2016-10-19 NOTE — Progress Notes (Signed)
Pt seen and examined. NGT not placed last night.  EXAM: Temp:  [97.8 F (36.6 C)-98.6 F (37 C)] 97.9 F (36.6 C) (02/26 0505) Pulse Rate:  [87-91] 89 (02/26 0505) Resp:  [18-20] 18 (02/26 0505) BP: (142-159)/(49-56) 159/49 (02/26 0505) SpO2:  [94 %-96 %] 96 % (02/26 0505) Weight:  [117.4 kg (258 lb 12.8 oz)] 117.4 kg (258 lb 12.8 oz) (02/26 0505) Intake/Output      02/25 0701 - 02/26 0700 02/26 0701 - 02/27 0700   P.O.  0   I.V. (mL/kg) 116.8 (1)    IV Piggyback     Total Intake(mL/kg) 116.8 (1) 0 (0)   Urine (mL/kg/hr) 500 (0.2)    Total Output 500     Net -383.2 0        Urine Occurrence 1 x     Awake and alert Follows commands throughout Weakness in upper extremities, stable  Stable NGT to be placed this AM Continue IV steroids

## 2016-10-19 NOTE — Evaluation (Signed)
Occupational Therapy Evaluation Patient Details Name: Joe [name] MRN: [id number] DOB: 1946/01/04 Today's Date: 10/19/2016    History of Present Illness Pt is a 71 y.o. male who presented to the ED with B hand numbness and B upper and lower body weakness with difficulty ambulating. Pt has a PMH significant for COPD, tobacco abuse, hypertension, hypogonadism, vitamin D deficiency, essential tremot, CAD s/p CABG, peripheral vascular disease, obesity, and hx of prior cervical surgery on 10-09-16.   Clinical Impression   This 71 yo male admitted with above presents to acute OT with deficits below (see OT problem list) thus affecting his PLOF of being able to take care of himself at home. He will benefit from acute OT with follow up OT on CIR to work back towards a more independent level.    Follow Up Recommendations  CIR;Supervision/Assistance - 24 hour    Equipment Recommendations  3 in 1 bedside commode    Recommendations for Other Services Rehab consult     Precautions / Restrictions Precautions Precautions: Fall Precaution Comments: pt reports he never had a neck brace following surgery Restrictions Weight Bearing Restrictions: No      Mobility Bed Mobility Overal bed mobility: Needs Assistance Bed Mobility: Sidelying to Sit;Sit to Sidelying Rolling: Min assist Sidelying to sit: Mod assist (trunk up to sit)     Sit to sidelying: Mod assist (feet up)  Transfers Overall transfer level: Needs assistance Equipment used: 2 person hand held assist Transfers: Sit to/from Stand Sit to Stand: Min assist          Balance Overall balance assessment: Needs assistance Sitting-balance support: No upper extremity supported;Feet supported Sitting balance-Leahy Scale: Fair Sitting balance - Comments: Sat EOB without diffculty during OT session   Standing balance support: Bilateral upper extremity supported;During functional activity Standing balance-Leahy Scale: Poor                             ADL Overall ADL's : Needs assistance/impaired Eating/Feeding: Minimal assistance;Sitting   Grooming: Maximal assistance;Sitting   Upper Body Bathing: Maximal assistance;Sitting   Lower Body Bathing: Maximal assistance Lower Body Bathing Details (indicate cue type and reason): Mod A sit<>stand Upper Body Dressing : Maximal assistance;Sitting   Lower Body Dressing: Total assistance ( Mod A sit<>stand)                       Vision Patient Visual Report: No change from baseline              Pertinent Vitals/Pain Pain Assessment: Faces Pain Score: 0-No pain Faces Pain Scale: Hurts little more Pain Location: neck pain Pain Descriptors / Indicators: Sore Pain Intervention(s): Monitored during session;Repositioned     Hand Dominance Right   Extremity/Trunk Assessment Upper Extremity Assessment Upper Extremity Assessment: RUE deficits/detail;LUE deficits/detail RUE Deficits / Details: Decreased AROM shoulder flexion with abduction component to approximately 90 degrees (cannot do true foreward flexion or abduction separately. rest 3/5 with decreased sensation in hands and decreased extension of fingers with palm flattening RUE Sensation: decreased light touch;decreased proprioception RUE Coordination: decreased fine motor;decreased gross motor LUE Deficits / Details: Decreased AROM shoulder flexion with abduction component to approximately 90 degrees (cannot do true foreward flexion or abduction separately. rest 3/5 with decreased sensation in hands and decreased extension of fingers with palm flattening LUE Sensation: decreased light touch;decreased proprioception LUE Coordination: decreased fine motor;decreased gross motor  Communication Communication Communication: No difficulties   Cognition Arousal/Alertness: Awake/alert Behavior During Therapy: WFL for tasks assessed/performed Overall Cognitive Status: Within  Functional Limits for tasks assessed Area of Impairment: Safety/judgement;Memory                  Home Living Family/patient expects to be discharged to:: Private residence Living Arrangements: Alone Available Help at Discharge: Family;Available PRN/intermittently;Friend(s) Type of Home: House Home Access: Stairs to enter           Bathroom Shower/Tub: Tub/shower unit Shower/tub characteristics: Curtain Biochemist, clinical: Standard     Home Equipment: Environmental consultant - 4 wheels;Wheelchair - manual          Prior Functioning/Environment Level of Independence: Independent with assistive device(s)        Comments: Pt reports he was independent before neck surgery and was doing good initially and then increased weakness.        OT Problem List: Decreased strength;Decreased range of motion;Impaired balance (sitting and/or standing);Decreased activity tolerance;Decreased coordination;Obesity;Pain      OT Treatment/Interventions: Self-care/ADL training;Therapeutic activities;DME and/or AE instruction;Patient/family education;Balance training    OT Goals(Current goals can be found in the care plan section) Acute Rehab OT Goals Patient Stated Goal: to get an antibiotic for this sinus infection that I have and to be able to eat and drink OT Goal Formulation: With patient Time For Goal Achievement: 11/02/16 Potential to Achieve Goals: Good  OT Frequency: Min 3X/week   Barriers to D/C: Decreased caregiver support             End of Session Nurse Communication:  (pts' concern for getting an antibiotic, friend in room concern for pt not having anything to eat and limited fluids since admission)  Activity Tolerance: Patient limited by fatigue Patient left: in bed;with call bell/phone within reach;with chair alarm set;with family/visitor present  OT Visit Diagnosis: Unsteadiness on feet (R26.81);History of falling (Z91.81);Other symptoms and signs involving the nervous system  (R29.898);Hemiplegia and hemiparesis Hemiplegia - Right/Left:  (both) Hemiplegia - dominant/non-dominant:  (both) Hemiplegia - caused by: Unspecified                ADL either performed or assessed with clinical judgement  Time: 1251-1320 OT Time Calculation (min): 29 min Charges:  OT General Charges $OT Visit: 1 Procedure OT Evaluation $OT Eval Moderate Complexity: 1 Procedure OT Treatments $Self Care/Home Management : 8-22 mins G-Codes: OT G-codes **NOT FOR INPATIENT CLASS** Functional Assessment Tool Used: AM-PAC 6 Clicks Daily Activity Functional Limitation: Self care Self Care Current Status [id number]): At least 60 percent but less than 80 percent impaired, limited or restricted Self Care Goal Status [id number]): At least 40 percent but less than 60 percent impaired, limited or restricted   Golden Circle, OTR/L [id number] 10/19/2016

## 2016-10-20 DIAGNOSIS — D72829 Elevated white blood cell count, unspecified: Secondary | ICD-10-CM

## 2016-10-20 DIAGNOSIS — R1312 Dysphagia, oropharyngeal phase: Secondary | ICD-10-CM

## 2016-10-20 DIAGNOSIS — E871 Hypo-osmolality and hyponatremia: Secondary | ICD-10-CM

## 2016-10-20 DIAGNOSIS — R42 Dizziness and giddiness: Secondary | ICD-10-CM

## 2016-10-20 DIAGNOSIS — I25810 Atherosclerosis of coronary artery bypass graft(s) without angina pectoris: Secondary | ICD-10-CM

## 2016-10-20 DIAGNOSIS — G959 Disease of spinal cord, unspecified: Secondary | ICD-10-CM

## 2016-10-20 NOTE — Progress Notes (Signed)
Patient ID: Joe [name], male   DOB: [birth year], 71 y.o.   MRN: [id number] Feels fair  No fever  is able to swallow ice Recheck swallow study tomorrow

## 2016-10-20 NOTE — Progress Notes (Signed)
ATTN neurosurgery team:  Noted PA's note yesterday mentions consult to pharmacy for nutrition (and that it was 2nd attempt)- upon review of orders, a consult for pharmacy to manage TPN has never been placed during this hospital stay.   In addition, patient does not meet qualifications for TPN as GI track is functioning. Patient's resistance to re-attempt NGT placement is not a therapeutic reason to attempt TPN as complications (such as infection concerns, electrolyte abnormalities and inherent risks due to PICC placement) would outweigh benefits. In addition, TPN is on a national critical shortage, and short term use is discouraged.  Please contact pharmacy with questions.  Jaelie Aguilera D. Awa Bachicha, PharmD, BCPS Clinical Pharmacist 10/20/2016 10:18 AM

## 2016-10-20 NOTE — Progress Notes (Addendum)
Inpatient Rehabilitation  I visited pt. at the bedside to discuss possible IP Rehab admission.  I provided informational booklets. Pt. currently not medically ready for IP Rehab. Will follow for progress toward readiness and for establishment of nutritional source.  Please call if questions.  [facility] Admissions Coordinator Cell [phone] Office [phone]   1700: I had a lengthy phone conversation with pt's daughter, Meyer Cory. Magda Paganini initially stated she does not know if family can pull together a 24 hour care plan for pt. upon Dc from rehab.  I explained that if pt's insurance approves for pt. to come to IP rehab, there is a possibility they will not cover a SNF stay following CIR.  I reiterated the need for family to gather their resources to provide 24 hour care for pt.Magda Paganini states they will do what needs to be done and wants to pursue CIR, stating her dad would absolutely not go to a nursing home.  I told her we would follow up once we hear from pt's insurance company.    Gerlean Ren

## 2016-10-20 NOTE — Progress Notes (Signed)
CSW spoke with pt concerning SNF as alternate plan in case CIR is not an option.  Patient would prefer home services if he cannot go to CIR and states he has lots of family that live nearby and a neighbor who could help as needed during the day while his family is at work.  CSW informed RNCM of pt preference for returning home if CIR cannot accept  CSW signing off  Jorge Ny, [place] Social Worker [phone]

## 2016-10-20 NOTE — Progress Notes (Signed)
Physical Therapy Treatment Patient Details Name: Joe [name] MRN: [id number] DOB: 1946-04-16 Today's Date: 10/20/2016    History of Present Illness Pt is a 71 y.o. male who presented to the ED with B hand numbness and B upper and lower body weakness with difficulty ambulating. Pt has a PMH significant for COPD, tobacco abuse, hypertension, hypogonadism, vitamin D deficiency, essential tremot, CAD s/p CABG, peripheral vascular disease, obesity, and hx of prior cervical surgery on 10-09-16.    PT Comments    Patient progressing to ambulation and able to get up to stand with less assist this session.  Remains high fall risk especially with ataxia in UE 's and difficulty managing walker safely.  Also risk of LE buckling due to decreased proprioception.  Will continue to need skilled PT in the acute setting and follow up CIR level rehab prior to d/c home.    Follow Up Recommendations  CIR     Equipment Recommendations  None recommended by PT    Recommendations for Other Services       Precautions / Restrictions Precautions Precautions: Fall Restrictions Weight Bearing Restrictions: No    Mobility  Bed Mobility Overal bed mobility: Needs Assistance Bed Mobility: Supine to Sit Rolling: Min assist         General bed mobility comments: asssist for safety  Transfers Overall transfer level: Needs assistance Equipment used: Rolling walker (2 wheeled) Transfers: Sit to/from Stand Sit to Stand: Min assist         General transfer comment: assist for balance, cues to grade movements due to ataxic quality and decreased core stability  Ambulation/Gait Ambulation/Gait assistance: Mod assist;+2 safety/equipment (chair following) Ambulation Distance (Feet): 10 Feet Assistive device: Rolling walker (2 wheeled) Gait Pattern/deviations: Step-through pattern;Decreased stride length;Ataxic;Wide base of support     General Gait Details: UE ataxia with RW so assist for walker  management, assist for balance and safety due to decreased proprioception in LE's   Stairs            Wheelchair Mobility    Modified Rankin (Stroke Patients Only)       Balance Overall balance assessment: Needs assistance Sitting-balance support: Feet supported Sitting balance-Leahy Scale: Fair     Standing balance support: Bilateral upper extremity supported Standing balance-Leahy Scale: Poor Standing balance comment: UE support and assist for balance                    Cognition Arousal/Alertness: Awake/alert Behavior During Therapy: WFL for tasks assessed/performed;Impulsive Overall Cognitive Status: Impaired/Different from baseline Area of Impairment: Safety/judgement;Awareness         Safety/Judgement: Decreased awareness of deficits Awareness: Emergent   General Comments: decreased safety awareness     Exercises      General Comments        Pertinent Vitals/Pain Faces Pain Scale: No hurt    Home Living                      Prior Function            PT Goals (current goals can now be found in the care plan section) Progress towards PT goals: Progressing toward goals    Frequency    Min 3X/week      PT Plan Current plan remains appropriate    Co-evaluation             End of Session Equipment Utilized During Treatment: Gait belt Activity Tolerance: Patient tolerated treatment well Patient left:  in chair;with call bell/phone within reach;with chair alarm set   PT Visit Diagnosis: Unsteadiness on feet (R26.81);Muscle weakness (generalized) (M62.81)     Time: [id number] PT Time Calculation (min) (ACUTE ONLY): 26 min  Charges:  $Gait Training: 8-22 mins $Therapeutic Activity: 8-22 mins                    G Codes:       Reginia Naas Nov 13, 2016, 12:58 PM  Magda Kiel, [place] Nov 13, 2016

## 2016-10-20 NOTE — Progress Notes (Signed)
New Consult to Dietitian placed yesterday at 1522 hr after dietitian assessed patient and placed note in pt chart at 1416 hr. RD re-visited patient today. Offered to place Cortrak Feeding tube again, but patient said "absolutely not"; pt stated that it was too painful and he has too much secretion and phlegm at this time. Pt remains NPO except for ice chips. Suctioning phlegm at time of visit. No changes in nutrition plan at this time. Consider sedation for tube feeding placement and/or placement by IR. Re-consult Cortrak for tube placement if pt changes his mind. Nutrition team will continue to follow along and provide nutrition interventions as appropriate.   Scarlette Ar RD, LDN, CSP Inpatient Clinical Dietitian Pager: [phone] After Hours Pager: [phone]

## 2016-10-21 ENCOUNTER — Inpatient Hospital Stay (HOSPITAL_COMMUNITY): Payer: Medicare Other

## 2016-10-21 ENCOUNTER — Inpatient Hospital Stay: Payer: Medicare Other | Admitting: Internal Medicine

## 2016-10-21 MED ORDER — RESOURCE THICKENUP CLEAR PO POWD
ORAL | Status: DC | PRN
  Filled 2016-10-21: qty 125

## 2016-10-21 MED ORDER — CHLORHEXIDINE GLUCONATE 0.12 % MT SOLN
15.0000 mL | Freq: Two times a day (BID) | OROMUCOSAL | Status: DC
  Administered 2016-10-21: 15 mL via OROMUCOSAL
  Filled 2016-10-21: qty 15

## 2016-10-21 MED ORDER — ORAL CARE MOUTH RINSE
15.0000 mL | Freq: Two times a day (BID) | OROMUCOSAL | Status: DC
  Administered 2016-10-21 – 2016-10-22 (×3): 15 mL via OROMUCOSAL

## 2016-10-21 NOTE — Progress Notes (Signed)
Occupational Therapy Treatment Patient Details Name: Joe [name] MRN: [id number] DOB: Jun 15, 1946 Today's Date: 10/21/2016    History of present illness Pt is a 71 y.o. male who presented to the ED with B hand numbness and B upper and lower body weakness with difficulty ambulating. Pt has a PMH significant for COPD, tobacco abuse, hypertension, hypogonadism, vitamin D deficiency, essential tremot, CAD s/p CABG, peripheral vascular disease, obesity, and hx of prior cervical surgery on 10-09-16.   OT comments  Pt required min assist for OOB to chair today. Pt impulsive and does not follow directional cues provided by therapist. Pt with good grip strength bil but has difficulty controlling strength due to coordination and sensation deficits. Educated pt on fine motor coordination activities that he can do in his room; pt verbalized understanding. Pt able to self feed ice chips with use of spoon with set up. D/c plan remains appropriate. Will continue to follow acutely.   Follow Up Recommendations  CIR;Supervision/Assistance - 24 hour    Equipment Recommendations  3 in 1 bedside commode    Recommendations for Other Services      Precautions / Restrictions Precautions Precautions: Fall Restrictions Weight Bearing Restrictions: No       Mobility Bed Mobility Overal bed mobility: Needs Assistance Bed Mobility: Supine to Sit     Supine to sit: Min guard     General bed mobility comments: Pt impulsive and does not follow log roll technique. Use of bed rails with HOB elevated minimally.  Transfers Overall transfer level: Needs assistance Equipment used: Rolling walker (2 wheeled) Transfers: Sit to/from [employer] Sit to Stand: Min assist Stand pivot transfers: Min assist       General transfer comment: Assist for balance. Cues for hand placement and safety; pt moves quickly and does not follow cues for hand placement    Balance Overall balance assessment:  Needs assistance Sitting-balance support: Feet supported;No upper extremity supported Sitting balance-Leahy Scale: Fair     Standing balance support: Bilateral upper extremity supported Standing balance-Leahy Scale: Poor Standing balance comment: RW for support                   ADL Overall ADL's : Needs assistance/impaired Eating/Feeding: Set up;Sitting Eating/Feeding Details (indicate cue type and reason): Pt able to self feed ice chips with spoon. Pt has good grip strength but poor sensation and coordination.                     Toilet Transfer: Minimal Designer, jewellery Details (indicate cue type and reason): Simulated by transfer from EOB to chair         Functional mobility during ADLs: Minimal assistance;Rolling walker General ADL Comments: Educated pt on fine motor coordination exercises that he can do in his room and continued functional use of bil UEs.      Vision                     Perception     Praxis      Cognition   Behavior During Therapy: [facility] for tasks assessed/performed;Impulsive Overall Cognitive Status: No family/caregiver present to determine baseline cognitive functioning Area of Impairment: Safety/judgement;Awareness;Memory     Memory: Decreased short-term memory    Safety/Judgement: Decreased awareness of safety;Decreased awareness of deficits Awareness: Emergent   General Comments: Pt with short term memory deficits; repeats himself frequently. Decreased safety awareness      Exercises     Shoulder Instructions  General Comments      Pertinent Vitals/ Pain       Pain Assessment: No/denies pain  Home Living                                          Prior Functioning/Environment              Frequency  Min 3X/week        Progress Toward Goals  OT Goals(current goals can now be found in the care plan section)  Progress towards OT goals: Progressing  toward goals  Acute Rehab OT Goals Patient Stated Goal: get better OT Goal Formulation: With patient  Plan Discharge plan remains appropriate    Co-evaluation                 End of Session Equipment Utilized During Treatment: Rolling walker  OT Visit Diagnosis: Unsteadiness on feet (R26.81);History of falling (Z91.81);Other symptoms and signs involving the nervous system (R29.898);Hemiplegia and hemiparesis Hemiplegia - Right/Left:  (both) Hemiplegia - dominant/non-dominant:  (both) Hemiplegia - caused by: Unspecified   Activity Tolerance Patient tolerated treatment well   Patient Left in chair;with call bell/phone within reach;with chair alarm set   Nurse Communication Mobility status;Other (comment) (IV beeping, pt with questions about tests today)        Time: [id number] OT Time Calculation (min): 20 min  Charges: OT General Charges $OT Visit: 1 Procedure OT Treatments $Self Care/Home Management : 8-22 mins  Ralphael [place]gate A. Ulice Brilliant, M.S., OTR/L Pager: [place] 10/21/2016, 10:42 AM

## 2016-10-21 NOTE — Progress Notes (Signed)
  Speech Language Pathology Treatment: Dysphagia  Patient Details Name: Joe [name] MRN: [id number] DOB: Jul 11, 1946 Today's Date: 10/21/2016 Time: 1155-1208 SLP Time Calculation (min) (ACUTE ONLY): 13 min  Assessment / Plan / Recommendation Clinical Impression  Skilled treatment session focused on dysphagia goals. SLP facilitated session by providing skilled observation with trials of ice chips. Pt without overt s/s of aspiration. Pt with the appearance of timely swallow initiation. Pt continues to use suction for post nasal drip in posterior pharynx. Pt states that he is to have a Modified Barium Swallow Study today. Information shared with SLP who will be completing Modified Barium Swallow Study.    HPI HPI: Joe [name] a 71 y.o.malewith past medical history notable for vertigo, tremor, PVD, COPD who presented to ER via ambulance for upper and lower extremity weakness. He is s/p ACDF at C3-4  10/09/2016. He reports symptoms began well before surgery and have not resolved. He also complains of hoarseness and sinus drainage. He states he has difficulties tolerating po when laying flat due to drainage from sinuses. MRI cervical spine findings concerning for abcess, fluid collection in left neck soft tissues; pt admitted for observation. No prior swallowing evaluations per chart.      SLP Plan  Continue with current plan of care;MBS       Recommendations  Diet recommendations: NPO (Ice chips after oral care) Medication Administration: Via alternative means Supervision: Patient able to self feed Compensations: Slow rate;Small sips/bites;Hard cough after swallow;Other (Comment) Postural Changes and/or Swallow Maneuvers: Seated upright 90 degrees                General recommendations: Rehab consult Oral Care Recommendations: Oral care QID Follow up Recommendations: Inpatient Rehab SLP Visit Diagnosis: Dysphagia, pharyngeal phase (R13.13) Plan: Continue with current plan  of care;MBS       GO              Joe [name] B. Joe [name], M.S., CCC-SLP Speech-Language Pathologist   Joe [name] 10/21/2016, 12:10 PM
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
## 2016-10-21 NOTE — Progress Notes (Signed)
Patient ID: Joe [name], male   DOB: 1945-09-03, 71 y.o.   MRN: [id number] Vital signs are stable Patient swallowing study so shows significant limitations Still on IV fluid  Patient indicated that he would like an MRI however I noted to him that this is not going to help [place] described why his having swallowing difficulties any better than the swallowing study is No MRI has been ordered Last for rehabilitation consult

## 2016-10-21 NOTE — Consult Note (Signed)
   [facility] Inpatient Consult   10/21/2016  [name] August 05, 1946 [id number]  Patient screened for re-hospitalization in the [facility].  Chart review for [name] is a 71 y.o. male who presented to ER via ambulance for upper and lower extremity weakness. He is s/p ACDF 10/09/2016. He reports symptoms began well before surgery and have not resolved. He denies any worsening in symptoms. He also complains of hoarseness and sinus drainage. He states he has difficulties tolerating po when laying flat due to drainage from sinuses. Denies any concerns with surgical site. Denies loss of bowel or bladder. No fevers.   Came by to the patient's room but patient is currently off of the unit.    Natividad Brood, RN BSN [facility] Liaison  [phone] business mobile phone Toll free office [phone]

## 2016-10-21 NOTE — Progress Notes (Signed)
Modified Barium Swallow Progress Note  Patient Details  Name: Joe [name] MRN: [id number] Date of Birth: 08-Oct-1945  Today's Date: 10/21/2016  Modified Barium Swallow completed.  Full report located under Chart Review in the Imaging Section.  Brief recommendations include the following:  Clinical Impression  Pt presents with mod-severe pharyngeal dysphagia (mildly improved compared to prior MBS with decreased pharyngeal edema). Observed penetration to the level of the vocal cords with honey thick and thin liquids via teaspoon and cup due to decreased laryngeal elevation and reduced epiglottic inversion with reflexive coughs intermittently clearing laryngeal vestibule. Mild-mod vallecular, pyriform sinus, pharyngeal wall, and CP segment residue (due to decreased UES relaxation) throughout study with spontaneous multiple swallows aiding in the reduction of residue. Flash penetration with one trial of pureed solids. Skilled intervention utilizing chin tuck with pureed solids decreased residue and likelihood of penetration however he is at high risk of penetrating/aspirating recommended texture. Educated pt re: swallow and compensatory strategies (chin tuck, swallow 3-4 times, cough/clear throat after every bite). Recommend Dys 1 solids (with chin tuck), pudding thick liquids, meds crushed. Ice chips and sips of water ok after oral care and only between meals in small amounts.   Swallow Evaluation Recommendations       SLP Diet Recommendations: Dysphagia 1 (Puree) solids;Pudding thick liquid;Ice chips PRN after oral care;Free water protocol after oral care   Liquid Administration via: Spoon   Medication Administration: Crushed with puree   Supervision: Full supervision/cueing for compensatory strategies;Staff to assist with self feeding   Compensations: Slow rate;Small sips/bites;Multiple dry swallows after each bite/sip   Postural Changes: Seated upright at 90 degrees   Oral Care  Recommendations: Oral care BID        Houston Siren 10/21/2016,3:15 PM   Joe [name] [employer] [phone]

## 2016-10-21 NOTE — Progress Notes (Addendum)
Inpatient Rehabilitation  Pt.'s diet has been advanced to dysphagia 1 with pudding thick liquids. Pt. believes he is to go for repeat MRI, however I do not see orders for this.  I will initiate insurance authorization for a potential CIR admission later in the week pending medical readiness,  insurance approval and bed availability. Gunnar Fusi will follow pt. Thursday/Friday in my absence.   Please call if questions.  [place] Admissions Coordinator Cell [phone] Office [phone]

## 2016-10-22 ENCOUNTER — Inpatient Hospital Stay (HOSPITAL_COMMUNITY)
Admission: RE | Admit: 2016-10-22 | Discharge: 2016-11-11 | DRG: 052 | Disposition: A | Discharge status: Home Health | Payer: Medicare Other | Source: Intra-hospital | Attending: Physical Medicine & Rehabilitation | Admitting: Physical Medicine & Rehabilitation

## 2016-10-22 DIAGNOSIS — K649 Unspecified hemorrhoids: Secondary | ICD-10-CM

## 2016-10-22 DIAGNOSIS — G8929 Other chronic pain: Secondary | ICD-10-CM

## 2016-10-22 DIAGNOSIS — K592 Neurogenic bowel, not elsewhere classified: Secondary | ICD-10-CM | POA: Diagnosis not present

## 2016-10-22 DIAGNOSIS — R251 Tremor, unspecified: Secondary | ICD-10-CM | POA: Diagnosis not present

## 2016-10-22 DIAGNOSIS — Z22322 Carrier or suspected carrier of Methicillin resistant Staphylococcus aureus: Secondary | ICD-10-CM

## 2016-10-22 DIAGNOSIS — K5900 Constipation, unspecified: Secondary | ICD-10-CM | POA: Diagnosis not present

## 2016-10-22 DIAGNOSIS — I10 Essential (primary) hypertension: Secondary | ICD-10-CM

## 2016-10-22 DIAGNOSIS — N319 Neuromuscular dysfunction of bladder, unspecified: Secondary | ICD-10-CM | POA: Diagnosis not present

## 2016-10-22 DIAGNOSIS — J329 Chronic sinusitis, unspecified: Secondary | ICD-10-CM

## 2016-10-22 DIAGNOSIS — Z7951 Long term (current) use of inhaled steroids: Secondary | ICD-10-CM | POA: Diagnosis not present

## 2016-10-22 DIAGNOSIS — K5901 Slow transit constipation: Secondary | ICD-10-CM | POA: Diagnosis not present

## 2016-10-22 DIAGNOSIS — J449 Chronic obstructive pulmonary disease, unspecified: Secondary | ICD-10-CM | POA: Diagnosis not present

## 2016-10-22 DIAGNOSIS — R140 Abdominal distension (gaseous): Secondary | ICD-10-CM

## 2016-10-22 DIAGNOSIS — F419 Anxiety disorder, unspecified: Secondary | ICD-10-CM

## 2016-10-22 DIAGNOSIS — Z79899 Other long term (current) drug therapy: Secondary | ICD-10-CM

## 2016-10-22 DIAGNOSIS — R1312 Dysphagia, oropharyngeal phase: Secondary | ICD-10-CM | POA: Diagnosis not present

## 2016-10-22 DIAGNOSIS — K219 Gastro-esophageal reflux disease without esophagitis: Secondary | ICD-10-CM | POA: Diagnosis not present

## 2016-10-22 DIAGNOSIS — E785 Hyperlipidemia, unspecified: Secondary | ICD-10-CM

## 2016-10-22 DIAGNOSIS — R1310 Dysphagia, unspecified: Secondary | ICD-10-CM | POA: Diagnosis not present

## 2016-10-22 DIAGNOSIS — R609 Edema, unspecified: Secondary | ICD-10-CM | POA: Diagnosis not present

## 2016-10-22 DIAGNOSIS — F17210 Nicotine dependence, cigarettes, uncomplicated: Secondary | ICD-10-CM

## 2016-10-22 DIAGNOSIS — Z951 Presence of aortocoronary bypass graft: Secondary | ICD-10-CM | POA: Diagnosis not present

## 2016-10-22 DIAGNOSIS — Z7902 Long term (current) use of antithrombotics/antiplatelets: Secondary | ICD-10-CM | POA: Diagnosis not present

## 2016-10-22 DIAGNOSIS — Z981 Arthrodesis status: Secondary | ICD-10-CM | POA: Diagnosis not present

## 2016-10-22 DIAGNOSIS — E871 Hypo-osmolality and hyponatremia: Secondary | ICD-10-CM

## 2016-10-22 DIAGNOSIS — I2510 Atherosclerotic heart disease of native coronary artery without angina pectoris: Secondary | ICD-10-CM | POA: Diagnosis not present

## 2016-10-22 DIAGNOSIS — G8250 Quadriplegia, unspecified: Principal | ICD-10-CM

## 2016-10-22 DIAGNOSIS — G959 Disease of spinal cord, unspecified: Secondary | ICD-10-CM

## 2016-10-22 MED ORDER — AMLODIPINE BESYLATE 5 MG PO TABS
5.0000 mg | ORAL_TABLET | Freq: Every day | ORAL | Status: DC
  Administered 2016-10-23 – 2016-11-11 (×20): 5 mg via ORAL
  Filled 2016-10-22 (×20): qty 1

## 2016-10-22 MED ORDER — VITAMIN B-6 100 MG PO TABS
100.0000 mg | ORAL_TABLET | Freq: Every day | ORAL | Status: DC
  Administered 2016-10-23 – 2016-11-11 (×20): 100 mg via ORAL
  Filled 2016-10-22 (×20): qty 1

## 2016-10-22 MED ORDER — AMOXICILLIN-POT CLAVULANATE 875-125 MG PO TABS
1.0000 | ORAL_TABLET | Freq: Two times a day (BID) | ORAL | Status: DC
  Administered 2016-10-22 – 2016-10-26 (×9): 1 via ORAL
  Filled 2016-10-22 (×10): qty 1

## 2016-10-22 MED ORDER — SENNA 8.6 MG PO TABS
1.0000 | ORAL_TABLET | Freq: Two times a day (BID) | ORAL | Status: DC
  Administered 2016-10-22: 8.6 mg via ORAL
  Filled 2016-10-22 (×5): qty 1

## 2016-10-22 MED ORDER — VITAMIN D 1000 UNITS PO TABS
1000.0000 [IU] | ORAL_TABLET | Freq: Every day | ORAL | Status: DC
  Administered 2016-10-23 – 2016-11-11 (×20): 1000 [IU] via ORAL
  Filled 2016-10-22 (×21): qty 1

## 2016-10-22 MED ORDER — SORBITOL 70 % SOLN: 30.0000 mL | Freq: Every day | Status: DC | PRN

## 2016-10-22 MED ORDER — LOSARTAN POTASSIUM 50 MG PO TABS
100.0000 mg | ORAL_TABLET | Freq: Every day | ORAL | Status: DC
  Administered 2016-10-23 – 2016-11-11 (×20): 100 mg via ORAL
  Filled 2016-10-22 (×20): qty 2

## 2016-10-22 MED ORDER — POLYETHYLENE GLYCOL 3350 17 G PO PACK: 17.0000 g | PACK | Freq: Every day | ORAL | Status: DC | PRN

## 2016-10-22 MED ORDER — RESOURCE THICKENUP CLEAR PO POWD
ORAL | Status: DC | PRN
  Filled 2016-10-22: qty 125

## 2016-10-22 MED ORDER — VITAMIN B-12 1000 MCG PO TABS
1500.0000 ug | ORAL_TABLET | Freq: Every day | ORAL | Status: DC
  Administered 2016-10-23 – 2016-11-11 (×20): 1500 ug via ORAL
  Filled 2016-10-22 (×20): qty 2

## 2016-10-22 MED ORDER — FUROSEMIDE 20 MG PO TABS
20.0000 mg | ORAL_TABLET | Freq: Two times a day (BID) | ORAL | Status: DC
  Administered 2016-10-23 – 2016-11-11 (×39): 20 mg via ORAL
  Filled 2016-10-22 (×39): qty 1

## 2016-10-22 MED ORDER — HYDROCODONE-ACETAMINOPHEN 5-325 MG PO TABS
1.0000 | ORAL_TABLET | ORAL | Status: DC | PRN
  Administered 2016-11-05 – 2016-11-06 (×2): 2 via ORAL
  Administered 2016-11-06: 1 via ORAL
  Administered 2016-11-10 – 2016-11-11 (×3): 2 via ORAL
  Filled 2016-10-22 (×5): qty 2
  Filled 2016-10-22: qty 1
  Filled 2016-10-22: qty 2
  Filled 2016-10-22: qty 1

## 2016-10-22 MED ORDER — OXYMETAZOLINE HCL 0.05 % NA SOLN
1.0000 | Freq: Two times a day (BID) | NASAL | Status: DC
  Administered 2016-10-22 – 2016-11-10 (×15): 1 via NASAL
  Filled 2016-10-22 (×2): qty 15

## 2016-10-22 MED ORDER — BISACODYL 5 MG PO TBEC: 5.0000 mg | DELAYED_RELEASE_TABLET | Freq: Every day | ORAL | Status: DC | PRN

## 2016-10-22 MED ORDER — ONDANSETRON HCL 4 MG/2ML IJ SOLN: 4.0000 mg | Freq: Four times a day (QID) | INTRAMUSCULAR | Status: DC | PRN

## 2016-10-22 MED ORDER — ONDANSETRON HCL 4 MG PO TABS
4.0000 mg | ORAL_TABLET | Freq: Four times a day (QID) | ORAL | Status: DC | PRN
Start: 2016-10-22 — End: 2016-11-11
  Filled 2016-10-22: qty 1

## 2016-10-22 MED ORDER — ACETAMINOPHEN 325 MG PO TABS
650.0000 mg | ORAL_TABLET | Freq: Four times a day (QID) | ORAL | Status: DC | PRN
  Administered 2016-10-24: 650 mg via ORAL
  Filled 2016-10-22: qty 2

## 2016-10-22 MED ORDER — IPRATROPIUM-ALBUTEROL 0.5-2.5 (3) MG/3ML IN SOLN: 3.0000 mL | Freq: Four times a day (QID) | RESPIRATORY_TRACT | Status: DC | PRN

## 2016-10-22 MED ORDER — GABAPENTIN 100 MG PO CAPS
100.0000 mg | ORAL_CAPSULE | Freq: Three times a day (TID) | ORAL | Status: DC
  Administered 2016-10-22 – 2016-10-29 (×20): 100 mg via ORAL
  Filled 2016-10-22 (×20): qty 1

## 2016-10-22 MED ORDER — DEXAMETHASONE 4 MG PO TABS
4.0000 mg | ORAL_TABLET | Freq: Four times a day (QID) | ORAL | Status: DC
  Administered 2016-10-22 – 2016-10-27 (×18): 4 mg via ORAL
  Filled 2016-10-22 (×19): qty 1

## 2016-10-22 MED ORDER — ASPIRIN EC 81 MG PO TBEC
81.0000 mg | DELAYED_RELEASE_TABLET | Freq: Every day | ORAL | Status: DC
  Administered 2016-10-23 – 2016-10-26 (×4): 81 mg via ORAL
  Filled 2016-10-22 (×4): qty 1

## 2016-10-22 MED ORDER — SODIUM CHLORIDE 0.45 % IV SOLN
INTRAVENOUS | Status: DC
  Administered 2016-10-22 – 2016-11-06 (×15): via INTRAVENOUS

## 2016-10-22 NOTE — Progress Notes (Signed)
Nutrition Follow-up  DOCUMENTATION CODES:   Obesity unspecified  INTERVENTION:   -Magic Cup TID with meals  NUTRITION DIAGNOSIS:   Inadequate oral intake related to dysphagia as evidenced by meal completion < 25%.  Ongoing  GOAL:   Patient will meet greater than or equal to 90% of their needs  Progressing  MONITOR:   PO intake, Supplement acceptance, Diet advancement, Labs, Weight trends, Skin, I & O's  REASON FOR ASSESSMENT:   Consult Assessment of nutrition requirement/status  ASSESSMENT:   Pt is a 71 y.o. male who presented to the ED with B hand numbness and B upper and lower body weakness with difficulty ambulating. Pt has a PMH significant for COPD, tobacco abuse, hypertension, hypogonadism, vitamin D deficiency, essential tremot, CAD s/p CABG, peripheral vascular disease, obesity, and hx of prior cervical surgery on 10-09-16.  Pt discussed with RD team; pt adamantly refused cortrak placement. Pt underwent BSE and MBSS on 10/21/16; pt was advanced to a dysphagia 1 diet with pudding thick liquids due to high risk of aspiration.   Meal completion poor per doc flowsheets; PO: 0-25%. Given pt's increased nutritional needs, pt will benefit from addition of nutritional supplements secondary to restrictive nature of diet. RD will add Magic Cup and will adjust supplement regimen accordingly depending on diet advancement.   Pt hopeful for CIR placement; admissions coordinator has initiated insurance authorization.   Labs reviewed: Na: 131 (on IV supplementation).   Diet Order:  Diet general DIET - DYS 1 Room service appropriate? Yes; Fluid consistency: Pudding Thick  Skin:  Wound (see comment) (neck incison )  Last BM:  10/21/16  Height:   Ht Readings from Last 1 Encounters:  10/17/16 5\' 9"  (1.753 m)    Weight:   Wt Readings from Last 1 Encounters:  10/19/16 258 lb 12.8 oz (117.4 kg)    Ideal Body Weight:  72.7 kg  BMI:  Body mass index is 38.22  kg/m.  Estimated Nutritional Needs:   Kcal:  2100-2400kcal/day   Protein:  117-140g/day   Fluid:  >2L/day   EDUCATION NEEDS:   No education needs identified at this time  Abbegayle Denault A. Jimmye Norman, RD, LDN, CDE Pager: [phone] After hours Pager: [phone]

## 2016-10-22 NOTE — Progress Notes (Signed)
Patient was informed about rehab process including patient safety plan and rehab booklet. 

## 2016-10-22 NOTE — Progress Notes (Signed)
  Speech Language Pathology Treatment: Dysphagia  Patient Details Name: Joe [name] MRN: [id number] DOB: Mar 03, 1946 Today's Date: 10/22/2016 Time: [id number] SLP Time Calculation (min) (ACUTE ONLY): 8 min  Assessment / Plan / Recommendation Clinical Impression  Pt eating when SLP entered room. He independently demonstrated chin tuck with verbal and visual cues needed for more effective technique. Verbal reminders given to swallow 3 extra times versus one or two and cough/throat clear. Wet vocal quality present x 2 and unintentional delayed throat clear noted. Encouraged pt that these modifications are short term until swallow ability improves and able to return to thin or thickened liquids. Sensation is decreased but overall reflexive attempts to clear penetrates successful. Continue ST intervention with hopeful admission to inpatient rehab.    HPI HPI: Joe [name] a 71 y.o.malewith past medical history notable for vertigo, tremor, PVD, COPD who presented to ER via ambulance for upper and lower extremity weakness. He is s/p ACDF at C3-4  10/09/2016. He reports symptoms began well before surgery and have not resolved. He also complains of hoarseness and sinus drainage. He states he has difficulties tolerating po when laying flat due to drainage from sinuses. MRI cervical spine findings concerning for abcess, fluid collection in left neck soft tissues; pt admitted for observation. MBS on 10/18/2016 with severe pharyngeal dysphagia, penetration/aspiration, and tissue edema. SLP recommended NPO with ice chips after oral care.      SLP Plan  Continue with current plan of care       Recommendations  Diet recommendations: Dysphagia 1 (puree);Pudding-thick liquid Liquids provided via: Teaspoon Medication Administration: Crushed with puree Supervision: Patient able to self feed;Intermittent supervision to cue for compensatory strategies Compensations: Slow rate;Small sips/bites;Multiple  dry swallows after each bite/sip;Clear throat intermittently;Chin tuck (cough after every other bite) Postural Changes and/or Swallow Maneuvers: Seated upright 90 degrees                General recommendations: Rehab consult Oral Care Recommendations: Oral care BID Follow up Recommendations: Inpatient Rehab SLP Visit Diagnosis: Dysphagia, pharyngoesophageal phase (R13.14) Plan: Continue with current plan of care       GO                Houston Siren 10/22/2016, 10:43 AM  Orbie Pyo Colvin [employer] [phone]

## 2016-10-22 NOTE — Progress Notes (Addendum)
Inpatient Rehabilitation  I have received insurance authorization and have a bed available to offer patient today.  I await a call back from acute MD for medical clearance prior to hopeful admission to IP Rehab today.  Please call with questions.  Addendum: I have received medical clearance and will proceed with IP Rehab admission today.  Carmelia Roller., CCC/SLP Admission Coordinator  [place]  Cell [phone]

## 2016-10-22 NOTE — Progress Notes (Addendum)
Inpatient Rehabilitation  Insurance authorization was initiated yesterday by my co-worker Gerlean Ren.  I am following along for medical readiness, insurance authorization, and bed availabllty.  I await word from [facility] Medicare and am hopeful for a decision later today.  Plan to follow up with the team as I know.  Please call with questions.   Carmelia Roller., CCC/SLP Admission Coordinator  [place]  Cell [phone]
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
## 2016-10-22 NOTE — Care Management Note (Signed)
Case Management Note  Patient Details  Name: Joe [name] MRN: [id number] Date of Birth: 08-28-1945  Subjective/Objective:                                    Admitted with weakness.   PMH significant for COPD, tobacco abuse, hypertension, hypogonadism, vitamin D deficiency, essential tremot, CAD s/p CABG, peripheral vascular disease, obesity, and hx of prior cervical surgery on 10-09-16.    Action/Plan: Plan is to d/c to CIR today . Pt deemed [facility] per MD Advisor in LOS meeting. Referral made with  Jeri Lager.  [facility] will f/u while in rehab.  Expected Discharge Date:  10/22/16               Expected Discharge Plan:  [place]  In-House Referral:     Discharge planning Services  CM Consult  Post Acute Care Choice:    Choice offered to:  Patient  DME Arranged:    DME Agency:     HH Arranged:    [facility] Agency:     Status of Service:  Completed, signed off  If discussed at [employer], dates discussed:    Additional Comments:  Sharin Mons, RN 10/22/2016, 2:34 PM

## 2016-10-23 ENCOUNTER — Inpatient Hospital Stay (HOSPITAL_COMMUNITY): Payer: Medicare Other | Admitting: Occupational Therapy

## 2016-10-23 ENCOUNTER — Encounter (HOSPITAL_COMMUNITY): Payer: Self-pay

## 2016-10-23 ENCOUNTER — Inpatient Hospital Stay (HOSPITAL_COMMUNITY): Payer: Medicare Other | Admitting: Physical Therapy

## 2016-10-23 ENCOUNTER — Inpatient Hospital Stay (HOSPITAL_COMMUNITY): Payer: Medicare Other

## 2016-10-23 ENCOUNTER — Inpatient Hospital Stay (HOSPITAL_COMMUNITY): Payer: Medicare Other | Admitting: Speech Pathology

## 2016-10-23 DIAGNOSIS — R1312 Dysphagia, oropharyngeal phase: Secondary | ICD-10-CM

## 2016-10-23 DIAGNOSIS — R609 Edema, unspecified: Secondary | ICD-10-CM

## 2016-10-23 LAB — CBC WITH DIFFERENTIAL/PLATELET
BASOS ABS: 0 10*3/uL (ref 0.0–0.1)
Basophils Relative: 0 %
EOS ABS: 0 10*3/uL (ref 0.0–0.7)
EOS PCT: 0 %
HCT: 41.7 % (ref 39.0–52.0)
Hemoglobin: 14.6 g/dL (ref 13.0–17.0)
LYMPHS PCT: 11 %
Lymphs Abs: 1.2 10*3/uL (ref 0.7–4.0)
MCH: 31.7 pg (ref 26.0–34.0)
MCHC: 35 g/dL (ref 30.0–36.0)
MCV: 90.7 fL (ref 78.0–100.0)
Monocytes Absolute: 0.8 10*3/uL (ref 0.1–1.0)
Monocytes Relative: 7 %
Neutro Abs: 8.9 10*3/uL — ABNORMAL HIGH (ref 1.7–7.7)
Neutrophils Relative %: 82 %
PLATELETS: 269 10*3/uL (ref 150–400)
RBC: 4.6 MIL/uL (ref 4.22–5.81)
RDW: 12.7 % (ref 11.5–15.5)
WBC: 10.9 10*3/uL — AB (ref 4.0–10.5)

## 2016-10-23 LAB — COMPREHENSIVE METABOLIC PANEL
ALBUMIN: 3.2 g/dL — AB (ref 3.5–5.0)
ALT: 43 U/L (ref 17–63)
AST: 22 U/L (ref 15–41)
Alkaline Phosphatase: 43 U/L (ref 38–126)
Anion gap: 9 (ref 5–15)
BUN: 18 mg/dL (ref 6–20)
CO2: 26 mmol/L (ref 22–32)
CREATININE: 0.86 mg/dL (ref 0.61–1.24)
Calcium: 9 mg/dL (ref 8.9–10.3)
Chloride: 97 mmol/L — ABNORMAL LOW (ref 101–111)
GFR calc Af Amer: >60 mL/min (ref >60)
GFR calc non Af Amer: >60 mL/min (ref >60)
Glucose, Bld: 112 mg/dL — ABNORMAL HIGH (ref 65–99)
Potassium: 4.1 mmol/L (ref 3.5–5.1)
SODIUM: 132 mmol/L — AB (ref 135–145)
Total Bilirubin: 1.3 mg/dL — ABNORMAL HIGH (ref 0.3–1.2)
Total Protein: 6 g/dL — ABNORMAL LOW (ref 6.5–8.1)

## 2016-10-23 MED ORDER — PRO-STAT SUGAR FREE PO LIQD
30.0000 mL | Freq: Two times a day (BID) | ORAL | Status: DC
  Administered 2016-10-23 – 2016-10-26 (×6): 30 mL via ORAL
  Filled 2016-10-23 (×7): qty 30

## 2016-10-23 NOTE — Progress Notes (Signed)
Patient information reviewed and entered into eRehab system by Jaycion Treml, RN, CRRN, PPS Coordinator.  Information including medical coding and functional independence measure will be reviewed and updated through discharge.     Per nursing patient was given "Data Collection Information Summary for Patients in Inpatient Rehabilitation Facilities with attached "Privacy Act Statement-Health Care Records" upon admission.  

## 2016-10-23 NOTE — Progress Notes (Signed)
Initial Nutrition Assessment  DOCUMENTATION CODES:   Obesity unspecified  INTERVENTION:  48 hour calorie count initiated.  Provide Magic cup TID between meals, each supplement provides 290 kcal and 9 grams of protein.  Continue 30 ml Prostat po BID, each supplement provides 100 kcal and 15 grams of protein.   Encourage adequate PO intake.   NUTRITION DIAGNOSIS:   Increased nutrient needs related to  (therapy) as evidenced by estimated needs.  GOAL:   Patient will meet greater than or equal to 90% of their needs  MONITOR:   PO intake, Supplement acceptance, Diet advancement, Labs, Weight trends, Skin, I & O's  REASON FOR ASSESSMENT:   Consult Calorie Count  ASSESSMENT:   71 y.o. right handed male with history of tremor with vertigo followed by Dr. Sabra Heck, CAD status post CABG as well as recent anterior cervical decompression C3-C4 discectomy and fusion 10/09/2016  Presented 10/17/2016 with increasing weakness and difficulty swallowing. MRI C-spine reviewed, showing fluid collection around spinal cord. A nasogastric tube was placed for nutritional supportThat patient pulled out and also recently started on diet of dysphagia 1 pudding-thick liquids.  Meal completion has been 80-100%. Pt reports appetite is fine however reports disliking the pureed food at his meals. Usual body weight reported to be ~284 lbs. Pt reports recent weight loss is related to fluid status. Calorie count has been initiated. RN made aware of orders. Pt currently has Prostat ordered and has been consuming them. Will continue with current orders. RD to additionally order Magic cup to aid in caloric and protein needs. RD to follow up with calorie count results on Monday, 10/26/16.   Pt with no observed significant fat or muscle mass loss.   Labs and medications reviewed.   Diet Order:  DIET - DYS 1 Room service appropriate? Yes; Fluid consistency: Pudding Thick  Skin:   (Incision on neck)  Last BM:   3/1  Height:   Ht Readings from Last 1 Encounters:  10/17/16 5\' 9"  (1.753 m)    Weight:   Wt Readings from Last 1 Encounters:  10/19/16 258 lb 12.8 oz (117.4 kg)    Ideal Body Weight:  72.7 kg  BMI:  There is no height or weight on file to calculate BMI.  Estimated Nutritional Needs:   Kcal:  2100-2300  Protein:  110-130 grams  Fluid:  2.1 - 2.3 L/day  EDUCATION NEEDS:   No education needs identified at this time  Corrin Parker, MS, RD, LDN Pager # [phone] After hours/ weekend pager # [phone]

## 2016-10-23 NOTE — Progress Notes (Signed)
*  PRELIMINARY RESULTS* Vascular Ultrasound Lower extremity venous duplex has been completed.  Preliminary findings: No evidence of DVT or baker's cyst.  Landry Mellow, RDMS, RVT  10/23/2016, 1:41 PM
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
## 2016-10-23 NOTE — IPOC Note (Signed)
Overall Plan of Care [facility]) Patient Details Name: Joe [name] MRN: [id number] DOB: 11/01/1945  Admitting Diagnosis: Cervical Mylopathy  Hospital Problems: Principal Problem:   Tetraparesis [facility]) Active Problems:   Dysphagia   Cervical myelopathy ([place])     Functional Problem List: Nursing Behavior, Bladder, Bowel, Edema, Endurance, Medication Management, Motor, Nutrition, Pain, Perception, Safety, Sensory, Skin Integrity  PT Pain, Safety, Balance, Endurance, Sensory, Motor  OT Balance, Endurance, Motor, Safety  SLP Nutrition  TR         Basic ADL's: OT Bathing, Dressing, Grooming, Eating, Toileting     Advanced  ADL's: OT Simple Meal Preparation     Transfers: PT Bed Mobility, Bed to Chair, Car, Manufacturing systems engineer, Metallurgist: PT Ambulation, Emergency planning/management officer, Stairs     Additional Impairments: OT Fuctional Use of Upper Extremity  SLP Swallowing      TR      Anticipated Outcomes Item Anticipated Outcome  Self Feeding mod I  Swallowing  Mod I   Basic self-care  set up A for bathing and dressing  Toileting  Supervision   Bathroom Transfers Supervision  Bowel/Bladder  continent of bladder and bowel  Transfers  S  Locomotion  S with RW  Communication     Cognition     Pain  less<3  Safety/Judgment  supervision   Therapy Plan: PT Intensity: Minimum of 1-2 x/day ,45 to 90 minutes PT Frequency: 5 out of 7 days PT Duration Estimated Length of Stay: 12-14 days OT Intensity: Minimum of 1-2 x/day, 45 to 90 minutes OT Frequency: 5 out of 7 days OT Duration/Estimated Length of Stay: 14-16 days SLP Intensity: Minumum of 1-2 x/day, 30 to 90 minutes SLP Frequency: 3 to 5 out of 7 days SLP Duration/Estimated Length of Stay: 14 to 16 days       Team Interventions: Nursing Interventions Patient/Family Education, Bladder Management, Bowel Management, Disease Management/Prevention, Pain Management, Medication Management, Skin  Care/Wound Management, Cognitive Remediation/Compensation, Dysphagia/Aspiration Precaution Training, Discharge Planning, Psychosocial Support  PT interventions Ambulation/gait training, Training and development officer, Community reintegration, Discharge planning, Disease management/prevention, Engineer, drilling, Functional mobility training, Neuromuscular re-education, Patient/family education, Stair training, Pain management, Psychosocial support, Therapeutic Activities, Therapeutic Exercise, UE/LE Strength taining/ROM, UE/LE Coordination activities, Wheelchair propulsion/positioning  OT Interventions Training and development officer, Discharge planning, DME/adaptive equipment instruction, Functional mobility training, Neuromuscular re-education, Pain management, Patient/family education, Psychosocial support, Self Care/advanced ADL retraining, Therapeutic Activities, Therapeutic Exercise, UE/LE Strength taining/ROM, UE/LE Coordination activities  SLP Interventions Dysphagia/aspiration precaution training, Therapeutic Exercise, Patient/family education, Oral motor exercises  TR Interventions    SW/CM Interventions Discharge Planning, Psychosocial Support, Patient/Family Education    Team Discharge Planning: Destination: PT-Home ,OT- Home , SLP-Home Projected Follow-up: PT-Home health PT, OT-  Home health OT, SLP-Home Health SLP, Outpatient SLP Projected Equipment Needs: PT-None recommended by PT, OT- To be determined, SLP-To be determined Equipment Details: PT-has RW, rollator, w/c, OT-  Patient/family involved in discharge planning: PT- Patient,  OT-Patient, SLP-Patient  MD ELOS: 12-15 days. Medical Rehab Prognosis:  Good Assessment:  71 y.o.right handed malewith history of tremor with vertigo followed by Dr. Sabra Heck, CAD status post CABG as well as recent anterior cervical decompression C3-C4 discectomy and fusion 10/09/2016 per Dr. Ellene Route. He was discharged to home 10/11/2016 ambulating  supervision with a rolling walker. He has friends that intermittently check on him. Presented 10/17/2016 with increasing weakness and difficulty swallowing. MRI C-spine reviewed, showing fluid collection around spinal cord, rim enhancement worrisome for postoperative abscess or  hematoma. There was fluid in the disc space and a small amount of fluid in the ventral epidural space. A nasogastric tube was placed for nutritional support that patient pulled out and later was started on dysphagia #1 pudding thick liquids. Neurosurgery follow-up and no plan forsurgical intervention.Placed on Unasyn for sinusitis plan change to oral agent.Presently on Decadron protocol.MRSA PCR screening positive placed on contact precautions. Mild hyponatremia monitored. Pt with resulting functional deficits with mobility, endurance, self-care.  Will set goals for supervision with PT/OT and supervision/Mod I with SLP.    See Team Conference Notes for weekly updates to the plan of care
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
## 2016-10-23 NOTE — Progress Notes (Signed)
Physical Therapy Note  Patient Details  Name: Joe [name] MRN: [id number] Date of Birth: July 08, 1946 Today's Date: 10/23/2016  Attempted to see patient for scheduled 30 min session, however he refused due to fatigue stating, "I'm whipped and overwhelmed from today, I didn't know I was coming to boot [employer]." Will f/u as able.    Emmani Lesueur, Murray Hodgkins 10/23/2016, 3:52 PM

## 2016-10-24 ENCOUNTER — Inpatient Hospital Stay (HOSPITAL_COMMUNITY): Payer: Medicare Other

## 2016-10-24 ENCOUNTER — Inpatient Hospital Stay (HOSPITAL_COMMUNITY): Payer: Medicare Other | Admitting: Speech Pathology

## 2016-10-24 DIAGNOSIS — E871 Hypo-osmolality and hyponatremia: Secondary | ICD-10-CM

## 2016-10-24 DIAGNOSIS — K592 Neurogenic bowel, not elsewhere classified: Secondary | ICD-10-CM | POA: Insufficient documentation

## 2016-10-24 DIAGNOSIS — I10 Essential (primary) hypertension: Secondary | ICD-10-CM

## 2016-10-24 DIAGNOSIS — J329 Chronic sinusitis, unspecified: Secondary | ICD-10-CM

## 2016-10-24 DIAGNOSIS — K5901 Slow transit constipation: Secondary | ICD-10-CM

## 2016-10-24 NOTE — Progress Notes (Signed)
Occupational Therapy Session Note  Patient Details  Name: Joe [name] MRN: [id number] Date of Birth: 1946/03/22  Today's Date: 10/24/2016 OT Individual Time: 1100-1200 OT Individual Time Calculation (min): 60 min   Short Term Goals: Week 1:  OT Short Term Goal 1 (Week 1): Pt will be able to complete stand pivot to toilet with min A. OT Short Term Goal 2 (Week 1): Pt will complete toileting tasks with min A to support balance. OT Short Term Goal 3 (Week 1): Pt will be able to groom with min A demonstrating improved FMC. OT Short Term Goal 4 (Week 1): Pt will be independent with [facility] HEP to use in his room.  Skilled Therapeutic Interventions/Progress Updates: ADL-retraining at bed level with focus on improved [place], AE instruction (reacher, sock aid, LH sponge), and pain management.   Pt received supine in bed and requesting assist to manage BM in brief.    Pt performed bed mobility with overall mod assist using HOB and rails but required total assist to change brief and provide hygiene d/t incoordination of both hands.   OT then provided pt ed on use of reacher, sock aid, LH sponge and initial recommendations with HEP to improve [facility] (writtent HEP provided) and for hand grip/pinch strengthening.    Pt required extra time to process instructions d/t poor frustration tolerance but this resolved during prolonged encounter.     Therapy Documentation Precautions:  Precautions Precautions: Fall Restrictions Weight Bearing Restrictions: No   Vital Signs: Therapy Vitals Temp: 98.2 F (36.8 C) Temp Source: Oral Pulse Rate: 61 Resp: 18 BP: 119/63 Patient Position (if appropriate): Sitting Oxygen Therapy SpO2: 96 % O2 Device: Not Delivered   Pain: Pain Assessment Pain Assessment: 0-10 Pain Score: 8  Pain Type: Neuropathic pain Pain Intervention(s): RN made aware Multiple Pain Sites: No   ADL: ADL ADL Comments: refer to functional navigator   See Function Navigator for Current  Functional Status.   Therapy/Group: Individual Therapy  Price 10/24/2016, 12:54 PM

## 2016-10-24 NOTE — Progress Notes (Signed)
Subjective/Complaints: Pt seen sitting up in bed watching TV this AM.  He states that didn't sleep well at all last night and that his neck, shoulder, back, tailbone hurt and he has nausea.   ROS: +neck, shoulder, back, tailbone pain, nausea.   Objective: Vital Signs: Blood pressure 139/66, pulse 60, temperature 98.7 F (37.1 C), temperature source Oral, resp. rate 18, SpO2 97 %. No results found. Results for orders placed or performed during the hospital encounter of 10/22/16 (from the past 72 hour(s))  CBC WITH DIFFERENTIAL     Status: Abnormal   Collection Time: 10/23/16  5:23 AM  Result Value Ref Range   WBC 10.9 (H) 4.0 - 10.5 K/uL   RBC 4.60 4.22 - 5.81 MIL/uL   Hemoglobin 14.6 13.0 - 17.0 g/dL   HCT 41.7 39.0 - 52.0 %   MCV 90.7 78.0 - 100.0 fL   MCH 31.7 26.0 - 34.0 pg   MCHC 35.0 30.0 - 36.0 g/dL   RDW 12.7 11.5 - 15.5 %   Platelets 269 150 - 400 K/uL   Neutrophils Relative % 82 %   Neutro Abs 8.9 (H) 1.7 - 7.7 K/uL   Lymphocytes Relative 11 %   Lymphs Abs 1.2 0.7 - 4.0 K/uL   Monocytes Relative 7 %   Monocytes Absolute 0.8 0.1 - 1.0 K/uL   Eosinophils Relative 0 %   Eosinophils Absolute 0.0 0.0 - 0.7 K/uL   Basophils Relative 0 %   Basophils Absolute 0.0 0.0 - 0.1 K/uL  Comprehensive metabolic panel     Status: Abnormal   Collection Time: 10/23/16  5:23 AM  Result Value Ref Range   Sodium 132 (L) 135 - 145 mmol/L   Potassium 4.1 3.5 - 5.1 mmol/L   Chloride 97 (L) 101 - 111 mmol/L   CO2 26 22 - 32 mmol/L   Glucose, Bld 112 (H) 65 - 99 mg/dL   BUN 18 6 - 20 mg/dL   Creatinine, Ser 0.86 0.61 - 1.24 mg/dL   Calcium 9.0 8.9 - 10.3 mg/dL   Total Protein 6.0 (L) 6.5 - 8.1 g/dL   Albumin 3.2 (L) 3.5 - 5.0 g/dL   AST 22 15 - 41 U/L   ALT 43 17 - 63 U/L   Alkaline Phosphatase 43 38 - 126 U/L   Total Bilirubin 1.3 (H) 0.3 - 1.2 mg/dL   GFR calc non Af Amer >60 >60 mL/min   GFR calc Af Amer >60 >60 mL/min    Comment: (NOTE) The eGFR has been calculated using the  CKD EPI equation. This calculation has not been validated in all clinical situations. eGFR's persistently <60 mL/min signify possible Chronic Kidney Disease.    Anion gap 9 5 - 15     HEENT: normocephalic, atraumatic.  Cardio: RRR. No JVD. Resp: CTA B/L and unlabored GI: BS positive and ND Skin:   Breakdown Gr 1 sacral, not examined Neuro: Alert/Oriented Sensation diminished to light touch b/l hands Motor 4+/5 bilateral delt , bi, tri , grip, HF, KE, ADF  Musc/Skel:  No edema. No tenderness.  Gen NAD. Vital signs reviewed.    Assessment/Plan: 1. 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 Physiatrist is providing close team supervision and 24 hour management of active medical problems listed below. Physiatrist and rehab team continue to assess barriers to discharge/monitor patient progress toward functional and medical goals. FIM: Function - Bathing Position: Research scientist (life sciences)  parts bathed by patient: Chest, Abdomen, Right arm, Left arm, Front perineal area, Right upper leg, Left upper leg Body parts bathed by helper: Buttocks, Right lower leg, Left lower leg, Back Assist Level: Touching or steadying assistance(Pt > 75%)  Function- Upper Body Dressing/Undressing What is the patient wearing?: Pull over shirt/dress Pull over shirt/dress - Perfomed by patient: Thread/unthread right sleeve, Thread/unthread left sleeve, Pull shirt over trunk Pull over shirt/dress - Perfomed by helper: Put head through opening Function - Lower Body Dressing/Undressing What is the patient wearing?: Pants, Non-skid slipper socks Position: Wheelchair/chair at sink Pants- Performed by helper: Thread/unthread right pants leg, Thread/unthread left pants leg, Pull pants up/down Non-skid slipper socks- Performed by helper: Don/doff right sock, Don/doff left sock  Function - Toileting Toileting  activity did not occur: No continent bowel/bladder event  Function - Air cabin crew transfer activity did not occur: Safety/medical concerns Toilet transfer assistive device: Elevated toilet seat/BSC over toilet, Grab bar Assist level to toilet: Moderate assist (Pt 50 - 74%/lift or lower) Assist level from toilet: Moderate assist (Pt 50 - 74%/lift or lower)  Function - Chair/bed transfer Chair/bed transfer method: Stand pivot Chair/bed transfer assist level: Moderate assist (Pt 50 - 74%/lift or lower) Chair/bed transfer assistive device: Armrests, Walker Chair/bed transfer details: Verbal cues for precautions/safety, Verbal cues for technique, Tactile cues for placement, Tactile cues for posture  Function - Locomotion: Wheelchair Max wheelchair distance: 75 Assist Level: Supervision or verbal cues Assist Level: Supervision or verbal cues Wheel 150 feet activity did not occur: Safety/medical concerns (fatigue) Turns around,maneuvers to table,bed, and toilet,negotiates 3% grade,maneuvers on rugs and over doorsills: No Function - Locomotion: Ambulation Assistive device: Walker-rolling Assist level: Moderate assist (Pt 50 - 74%) Assist level: Moderate assist (Pt 50 - 74%) Walk 50 feet with 2 turns activity did not occur: Safety/medical concerns Walk 150 feet activity did not occur: Safety/medical concerns Walk 10 feet on uneven surfaces activity did not occur: Safety/medical concerns  Function - Comprehension Comprehension: Auditory Comprehension assist level: Follows complex conversation/direction with extra time/assistive device  Function - Expression Expression: Verbal Expression assist level: Expresses basic needs/ideas: With no assist  Function - Social Interaction Social Interaction assist level: Interacts appropriately with others - No medications needed.  Function - Problem Solving Problem solving assist level: Solves complex problems: Recognizes &  self-corrects  Function - Memory Memory assist level: Recognizes or recalls 90% of the time/requires cueing < 10% of the time Patient normally able to recall (first 3 days only): Current season, Location of own room, Staff names and faces, That he or she is in a hospital  Medical Problem List and Plan: 1. 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 DVT Prophylaxis/Anticoagulation: SCDs.   Vascular study neg for DVT. 3. Pain Management: Neurontin 100 mg 3 times a day, hydrocodone as needed  Encouraged taking medications if needed 4. Mood: Provide emotional support 5. Neuropsych: This patient iscapable of making decisions on hisown behalf. 6. Skin/Wound Care: Routine skin checks 7. Fluids/Electrolytes/Nutrition: Routine I&O , BUN/Creat ok, mild reduced alb, started Pro stat 8.Dysphagia. Dysphagia #1 pudding thick liquids. Follow-up speech therapy.IV fluids daily at bedtime 9.CAD status post CABG. No chest pain or shortness of breath. Discusswith neurosurgery onresuming aspirin 81 mg daily 10.Hypertension. Cozaar 100 mg daily, Lasix 20 mg twice a day. Monitor with increased mobility Vitals:   10/23/16 1441 10/24/16 0405  BP: (!) 139/55 139/66  Pulse: 62 60  Resp: 18 18  Temp: 98.7 F (37.1 C) 98.7 F (37.1 C)   Controlled 3/3 11.MRSA PCR screening positive. Contact precautions 12.Sinusitis.Unasyn changed to augmentin 12.Hyponatremia. Na+ 132 on 3/2 13.Tobacco abuse. Counseling 14.Constipation. Laxative assistance, senna BID, miralax and dulcolax po prn  LOS (Days) 2 A FACE TO FACE EVALUATION WAS PERFORMED  Zimir Kittleson Lorie Phenix 10/24/2016, 7:35 AM

## 2016-10-24 NOTE — Progress Notes (Signed)
Speech Language Pathology Daily Session Note  Patient Details  Name: Joe [name] MRN: [id number] Date of Birth: 1945-09-03  Today's Date: 10/24/2016 SLP Individual Time: 1030-1100 SLP Individual Time Calculation (min): 30 min  Short Term Goals: Week 1: SLP Short Term Goal 1 (Week 1): Given supervision cues pt will use compensatory swallow strategies when consuming current diet to reduce aspiration risk.  SLP Short Term Goal 2 (Week 1): Pt will consume trials of ice chips and small sips of thin liquids without overt s/s of aspiration in preparation for further instrumental swallow study.   Skilled Therapeutic Interventions: Skilled treatment session focused on addressing dysphagia goals. Upon SLP arrival patient reported that he had done all the swallowing he was going to do for the day and he knew what to do.  SLP educated patient on the purpose and goals for swallowing.  Patient agreeable to ice chip trials with SLP providing Max verbal cues to refrain from talking and focuses on carryover of similar swallow strategies he utilizes with meals of multiple hard effortful swallows.  Patient with overt s/s of aspiration in 100% of trials and as a result, recommend a modification in care plan to include full supervision with the ice chip protocol with a trained staff member.  Patient verbalized frustration with this due to changes in his care always occurring SLP attempted to educate on the purpose of the modification iven a change in his function from last session; however, patient did not appear accepting of this information.  Orders modified to reflect recommedations.    Function:  Eating Eating   Modified Consistency Diet: Yes Eating Assist Level: Set up assist for;Supervision or verbal cues   Eating Set Up Assist For: Opening containers       Cognition Comprehension Comprehension assist level: Understands complex 90% of the time/cues 10% of the time  Expression   Expression assist  level: Expresses complex 90% of the time/cues < 10% of the time  Social Interaction Social Interaction assist level: Interacts appropriately 90% of the time - Needs monitoring or encouragement for participation or interaction.  Problem Solving Problem solving assist level: Solves complex 90% of the time/cues < 10% of the time  Memory Memory assist level: Recognizes or recalls 90% of the time/requires cueing < 10% of the time    Pain Pain Assessment Pain Assessment: 0-10 Pain Score: 8  Pain Type: Neuropathic pain Pain Intervention(s): RN made aware Multiple Pain Sites: No  Therapy/Group: Individual Therapy  Carmelia Roller., [place] [id number]  [place] 10/24/2016, 11:06 AM

## 2016-10-25 ENCOUNTER — Inpatient Hospital Stay (HOSPITAL_COMMUNITY): Payer: Medicare Other | Admitting: *Deleted

## 2016-10-25 ENCOUNTER — Inpatient Hospital Stay (HOSPITAL_COMMUNITY): Payer: Medicare Other

## 2016-10-25 ENCOUNTER — Inpatient Hospital Stay (HOSPITAL_COMMUNITY): Payer: Medicare Other | Admitting: Occupational Therapy

## 2016-10-25 DIAGNOSIS — K649 Unspecified hemorrhoids: Secondary | ICD-10-CM

## 2016-10-25 MED ORDER — WITCH HAZEL-GLYCERIN EX PADS
MEDICATED_PAD | CUTANEOUS | Status: DC | PRN
  Administered 2016-10-26: 06:00:00 via TOPICAL
  Administered 2016-10-29: 1 via TOPICAL
  Filled 2016-10-25: qty 100

## 2016-10-25 MED ORDER — HYDROCORTISONE ACETATE 25 MG RE SUPP
25.0000 mg | Freq: Two times a day (BID) | RECTAL | Status: DC
  Administered 2016-10-25 – 2016-11-11 (×32): 25 mg via RECTAL
  Filled 2016-10-25 (×39): qty 1

## 2016-10-25 MED ORDER — FLEET ENEMA 7-19 GM/118ML RE ENEM: 1.0000 | ENEMA | Freq: Once | RECTAL | Status: DC

## 2016-10-25 NOTE — Progress Notes (Signed)
Subjective/Complaints: Pt seen laying in bed this AM.  He states he slept fair, but had some rectal pain and requests and enema.    ROS: +rectal pain. Denies CP, SOB, N/V/D.  Objective: Vital Signs: Blood pressure (!) 134/55, pulse 60, temperature 98.7 F (37.1 C), temperature source Oral, resp. rate 18, SpO2 95 %. No results found. Results for orders placed or performed during the hospital encounter of 10/22/16 (from the past 72 hour(s))  CBC WITH DIFFERENTIAL     Status: Abnormal   Collection Time: 10/23/16  5:23 AM  Result Value Ref Range   WBC 10.9 (H) 4.0 - 10.5 K/uL   RBC 4.60 4.22 - 5.81 MIL/uL   Hemoglobin 14.6 13.0 - 17.0 g/dL   HCT 41.7 39.0 - 52.0 %   MCV 90.7 78.0 - 100.0 fL   MCH 31.7 26.0 - 34.0 pg   MCHC 35.0 30.0 - 36.0 g/dL   RDW 12.7 11.5 - 15.5 %   Platelets 269 150 - 400 K/uL   Neutrophils Relative % 82 %   Neutro Abs 8.9 (H) 1.7 - 7.7 K/uL   Lymphocytes Relative 11 %   Lymphs Abs 1.2 0.7 - 4.0 K/uL   Monocytes Relative 7 %   Monocytes Absolute 0.8 0.1 - 1.0 K/uL   Eosinophils Relative 0 %   Eosinophils Absolute 0.0 0.0 - 0.7 K/uL   Basophils Relative 0 %   Basophils Absolute 0.0 0.0 - 0.1 K/uL  Comprehensive metabolic panel     Status: Abnormal   Collection Time: 10/23/16  5:23 AM  Result Value Ref Range   Sodium 132 (L) 135 - 145 mmol/L   Potassium 4.1 3.5 - 5.1 mmol/L   Chloride 97 (L) 101 - 111 mmol/L   CO2 26 22 - 32 mmol/L   Glucose, Bld 112 (H) 65 - 99 mg/dL   BUN 18 6 - 20 mg/dL   Creatinine, Ser 0.86 0.61 - 1.24 mg/dL   Calcium 9.0 8.9 - 10.3 mg/dL   Total Protein 6.0 (L) 6.5 - 8.1 g/dL   Albumin 3.2 (L) 3.5 - 5.0 g/dL   AST 22 15 - 41 U/L   ALT 43 17 - 63 U/L   Alkaline Phosphatase 43 38 - 126 U/L   Total Bilirubin 1.3 (H) 0.3 - 1.2 mg/dL   GFR calc non Af Amer >60 >60 mL/min   GFR calc Af Amer >60 >60 mL/min    Comment: (NOTE) The eGFR has been calculated using the CKD EPI equation. This calculation has not been validated in all  clinical situations. eGFR's persistently <60 mL/min signify possible Chronic Kidney Disease.    Anion gap 9 5 - 15     HEENT: normocephalic, atraumatic.  Cardio: RRR. No JVD. Resp: CTA B/L and Unlabored GI: BS positive and ND Skin:   Breakdown Gr 1 sacral, not examined Neuro: Alert/Oriented Sensation diminished to light touch b/l hands Motor 4+/5 bilateral delt , bi, tri , grip, HF, KE, ADF  Musc/Skel:  No edema. No tenderness.  Gen NAD. Vital signs reviewed.    Assessment/Plan: 1. 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 Physiatrist is providing close team supervision and 24 hour management of active medical problems listed below. Physiatrist and rehab team continue to assess barriers to discharge/monitor patient progress toward functional and medical goals. FIM: Function - Bathing Position: Shower Body parts bathed by patient: Chest, Abdomen, Right arm, Left arm,  Front perineal area, Right upper leg, Left upper leg Body parts bathed by helper: Buttocks, Right lower leg, Left lower leg, Back Assist Level: Touching or steadying assistance(Pt > 75%)  Function- Upper Body Dressing/Undressing What is the patient wearing?: Pull over shirt/dress Pull over shirt/dress - Perfomed by patient: Thread/unthread right sleeve, Thread/unthread left sleeve, Pull shirt over trunk Pull over shirt/dress - Perfomed by helper: Put head through opening Function - Lower Body Dressing/Undressing What is the patient wearing?: Pants, Non-skid slipper socks Position: Wheelchair/chair at sink Pants- Performed by helper: Thread/unthread right pants leg, Thread/unthread left pants leg, Pull pants up/down Non-skid slipper socks- Performed by helper: Don/doff right sock, Don/doff left sock  Function - Toileting Toileting activity did not occur: No continent bowel/bladder event  Function -  Air cabin crew transfer activity did not occur: Safety/medical concerns Toilet transfer assistive device: Elevated toilet seat/BSC over toilet, Grab bar Assist level to toilet: Moderate assist (Pt 50 - 74%/lift or lower) Assist level from toilet: Moderate assist (Pt 50 - 74%/lift or lower)  Function - Chair/bed transfer Chair/bed transfer method: Stand pivot Chair/bed transfer assist level: Moderate assist (Pt 50 - 74%/lift or lower) Chair/bed transfer assistive device: Armrests, Walker Chair/bed transfer details: Verbal cues for precautions/safety, Verbal cues for technique, Tactile cues for placement, Tactile cues for posture  Function - Locomotion: Wheelchair Max wheelchair distance: 75 Assist Level: Supervision or verbal cues Assist Level: Supervision or verbal cues Wheel 150 feet activity did not occur: Safety/medical concerns (fatigue) Turns around,maneuvers to table,bed, and toilet,negotiates 3% grade,maneuvers on rugs and over doorsills: No Function - Locomotion: Ambulation Assistive device: Walker-rolling Assist level: Moderate assist (Pt 50 - 74%) Assist level: Moderate assist (Pt 50 - 74%) Walk 50 feet with 2 turns activity did not occur: Safety/medical concerns Walk 150 feet activity did not occur: Safety/medical concerns Walk 10 feet on uneven surfaces activity did not occur: Safety/medical concerns  Function - Comprehension Comprehension: Auditory Comprehension assist level: Understands complex 90% of the time/cues 10% of the time  Function - Expression Expression: Verbal Expression assist level: Expresses complex 90% of the time/cues < 10% of the time  Function - Social Interaction Social Interaction assist level: Interacts appropriately 90% of the time - Needs monitoring or encouragement for participation or interaction.  Function - Problem Solving Problem solving assist level: Solves complex 90% of the time/cues < 10% of the time  Function -  Memory Memory assist level: Recognizes or recalls 90% of the time/requires cueing < 10% of the time Patient normally able to recall (first 3 days only): Current season, Location of own room, Staff names and faces, That he or she is in a hospital  Medical Problem List and Plan: 1. 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 DVT Prophylaxis/Anticoagulation: SCDs.   Vascular study neg for DVT. 3. Pain Management: Neurontin 100 mg 3 times a day, hydrocodone as needed  Encouraged taking medications if needed 4. Mood: Provide emotional support 5. Neuropsych: This patient iscapable of making decisions on hisown behalf. 6. Skin/Wound Care: Routine skin checks 7. Fluids/Electrolytes/Nutrition: Routine I&Os , BUN/Creat ok, mild reduced alb, started Pro stat 8.Dysphagia. Dysphagia #1 pudding thick liquids. Follow-up speech therapy.IV fluids daily at bedtime 9.CAD status post CABG. No chest pain or shortness of breath. Discusswith neurosurgery onresuming aspirin 81 mg daily 10.Hypertension. Cozaar 100 mg daily, Lasix 20 mg twice a day. Monitor with increased mobility Vitals:   10/24/16 1252 10/25/16 0526  BP: 119/63 [name])  134/55  Pulse: 61 60  Resp: 18 18  Temp: 98.2 F (36.8 C) 98.7 F (37.1 C)   Controlled 3/4 11.MRSA PCR screening positive. Contact precautions 12.Sinusitis.Unasyn changed to augmentin 12.Hyponatremia. Na+ 132 on 3/2 13.Tobacco abuse. Counseling 14.Constipation. Laxative assistance, senna BID, miralax and dulcolax po prn.   Enema x1 on 3/4 15. Hemorrhoids  Tucks ordered 3/4  LOS (Days) 3 A FACE TO FACE EVALUATION WAS PERFORMED  [place]  Lorie Phenix 10/25/2016, 7:12 AM

## 2016-10-25 NOTE — Progress Notes (Signed)
Occupational Therapy Session Note  Patient Details  Name: Joe [name] MRN: [id number] Date of Birth: 04-19-1946  Today's Date: 10/25/2016 OT Individual Time: 1100-1200 OT Individual Time Calculation (min): 60 min   Short Term Goals: Week 1:  OT Short Term Goal 1 (Week 1): Pt will be able to complete stand pivot to toilet with min A. OT Short Term Goal 2 (Week 1): Pt will complete toileting tasks with min A to support balance. OT Short Term Goal 3 (Week 1): Pt will be able to groom with min A demonstrating improved FMC. OT Short Term Goal 4 (Week 1): Pt will be independent with [facility] HEP to use in his room.  Skilled Therapeutic Interventions/Progress Updates: ADL-retraining with focus on transfers, bed mobility, and [place].   Pt received seated in w/c and complaining of fatigue from prolonged sitting upright since am session.   Pt requested return to bed and ambulated using RW from w/c placed near doorway to his bed, approx 20' using RW.   Pt recovered to supine and reported possible small BM from exertion.   Pt performed bed mobility and partial undressing of lower body (pt=25%) to allow OT to complete hygiene.   OT noted soiled brief with condom cath off and small residue of BM.   After total assist for hygiene, pt reported his perception of miscommunication with MD during morning rounds.   Pt stated he has had internal hemorrhoids for 12 years and he is aware of flare-up symptoms which make passing BM difficult for him.   Pt states that he requested suppository (anusol) to relieve symptoms but he believes MD did not understand his request.  OT clarified with RN Santiago Glad) who confirmed that MD prescribed suppositories as requested; OT relayed info to pt at end of session.   Pt performed light housekeeping task using both hands to replace synthetic pillow cases with cotton pillow cases d/t complaint of reaction from sheets (excessive heat and skin irritation).    Pt reports incoordination in both  hands limits [facility] which was evident during functional task as pt required extra time and vc to progress with task.   Pt left in bed at end of session with all needs placed within reach.     Therapy Documentation Precautions:  Precautions Precautions: Fall Restrictions Weight Bearing Restrictions: No  Therapy Vitals Temp: 97.9 F (36.6 C) Temp Source: Oral Pulse Rate: 64 Resp: 17 BP: (!) 108/55 Patient Position (if appropriate): Sitting Oxygen Therapy SpO2: 98 % O2 Device: Not Delivered   Pain: No/denies pain  ADL: ADL ADL Comments: refer to functional navigator   See Function Navigator for Current Functional Status.   Therapy/Group: Individual Therapy  [place] 10/25/2016, 1:27 PM

## 2016-10-25 NOTE — Progress Notes (Signed)
Occupational Therapy Session Note  Patient Details  Name: Joe [name] MRN: [id number] Date of Birth: 08-Mar-1946  Today's Date: 10/25/2016 OT Individual Time: 0700-0800 OT Individual Time Calculation (min): 60 min    Short Term Goals: Week 1:  OT Short Term Goal 1 (Week 1): Pt will be able to complete stand pivot to toilet with min A. OT Short Term Goal 2 (Week 1): Pt will complete toileting tasks with min A to support balance. OT Short Term Goal 3 (Week 1): Pt will be able to groom with min A demonstrating improved FMC. OT Short Term Goal 4 (Week 1): Pt will be independent with [facility] HEP to use in his room.  Skilled Therapeutic Interventions/Progress Updates:    Pt seen for OT session focusing on ADL re-training. Pt in supine upon arrival, voicing having rough night from hemmrhoid pain, however, agreeable to tx session. He transferred to EOB with  Supervision using hospital bed functions. Seated EOB, pt used sock aid to don B socks following demonstration for technique.  He stood from EOB with mod A for stand pivot into chair. Dressed UB with set-up and used reacher to assist with threading LEs into pants. He stood from w/c initially with min A progressing to supervision with steadying assist for balance. Pt refusing to attempt to pull pants up independently or attempt buttock hygiene when changing brief. He washed face and brushed hair at sink mod I with increased time. He ate breakfast with assist for set-up and min VCs for swallowing pre-cautions though pt not receptive to cues or recommendations. Grooming completed at sink with set-up.  Throughout session, pt very quick to ask for assistance and becomes frustrated with therapist when encouragement is given for participation. Pt with very low frustration tolerance, requiring VCs throughout to calm down and take things slowly. Attempted to have pt list things he would like to work on in order to reduce frustration level, however, pt unable  to name anything. He is very aware of his deficits, however, is requires extra encouragement to work on these things to get to the independence level he desires.   Therapy Documentation Precautions:  Precautions Precautions: Fall Restrictions Weight Bearing Restrictions: No  ADL: ADL ADL Comments: refer to functional navigator  See Function Navigator for Current Functional Status.   Therapy/Group: Individual Therapy  Lewis, Malcolm Hetz C 10/25/2016, 6:41 AM

## 2016-10-25 NOTE — Progress Notes (Addendum)
Patient refused his bed alarm on. Explained and educated patient about safety plan. He said he has no plans of getting out of bed and will call if needed. Also refused senna.Will monitor.

## 2016-10-25 NOTE — Progress Notes (Addendum)
Physical Therapy Session Note  Patient Details  Name: Joe [name] MRN: [id number] Date of Birth: 11/01/1945  Today's Date: 10/25/2016 PT Individual Time: 1300-1400 PT Individual Time Calculation (min): 60 min  15 min skilled PT missed due to pt fatigue. Pt educated on principles of rehabilitation and increasing activity tolerance and strength and was advised to participate with all of his therapies to full schedule as much as possible for optimal functional outcome and safety.   Short Term Goals: Week 1:  PT Short Term Goal 1 (Week 1): Pt will perform bed mobility minA PT Short Term Goal 2 (Week 1): Pt will perform stand pivot transfers minA PT Short Term Goal 3 (Week 1): Pt will ambulate 30' minA with LRAD PT Short Term Goal 4 (Week 1): Pt will initiate stair training  Skilled Therapeutic Interventions/Progress Updates:    Tx focused on therex for strengthening and activity tolerance, functional mobility training, and NMR for UE motor control.  Pt resting in bed with many c/o fatigue and hemorrhoid flare-up. Refusing OOB activity and needed max encouragement for participation.   Supine therex included: ankle pumps, SAQ, heel slides Sidelying hip ABD with cues for technique.   Supine > Sit with S and safety cues for neck protection.  Pt sat EOB for bil UE [place] tasks including: pill bottle transfer<>weekly organizer, rubber band on/off cup, and clip around box with increased time and cues for technique.   Pt tolerated EOB sitting with distant S x37min   Sit>supine with S.   Pt declining any further tx in any manner. Pt left up in bed with all needs in reach.    Therapy Documentation Precautions:  Precautions Precautions: Fall Restrictions Weight Bearing Restrictions: No General:   Vital Signs: Therapy Vitals Temp: 97.9 F (36.6 C) Temp Source: Oral Pulse Rate: 64 Resp: 17 BP: (!) 108/55 Patient Position (if appropriate): Sitting Oxygen Therapy SpO2: 98 % O2  Device: Not Delivered Pain: none   See Function Navigator for Current Functional Status.   Therapy/Group: Individual Therapy  Colum Colt Soundra Pilon, PT, DPT  10/25/2016, 1:29 PM

## 2016-10-26 ENCOUNTER — Inpatient Hospital Stay (HOSPITAL_COMMUNITY): Payer: Medicare Other | Admitting: Occupational Therapy

## 2016-10-26 ENCOUNTER — Inpatient Hospital Stay (HOSPITAL_COMMUNITY): Payer: Medicare Other

## 2016-10-26 ENCOUNTER — Inpatient Hospital Stay (HOSPITAL_COMMUNITY): Payer: Medicare Other | Admitting: Speech Pathology

## 2016-10-26 ENCOUNTER — Encounter (HOSPITAL_COMMUNITY): Payer: Self-pay | Admitting: *Deleted

## 2016-10-26 ENCOUNTER — Inpatient Hospital Stay (HOSPITAL_COMMUNITY): Payer: Medicare Other | Admitting: Physical Therapy

## 2016-10-26 MED ORDER — POLYETHYLENE GLYCOL 3350 17 G PO PACK
17.0000 g | PACK | Freq: Two times a day (BID) | ORAL | Status: DC
  Administered 2016-10-28: 17 g via ORAL
  Filled 2016-10-26 (×5): qty 1

## 2016-10-26 MED ORDER — PRO-STAT SUGAR FREE PO LIQD
30.0000 mL | Freq: Three times a day (TID) | ORAL | Status: DC
  Administered 2016-10-26 – 2016-11-10 (×16): 30 mL via ORAL
  Filled 2016-10-26 (×25): qty 30

## 2016-10-26 NOTE — Care Management Note (Signed)
Inpatient [facility] Individual Statement of Services  Patient Name:  Joe [name]  Date:  10/26/2016  Welcome to the [facility].  Our goal is to provide you with an individualized program based on your diagnosis and situation, designed to meet your specific needs.  With this comprehensive rehabilitation program, you will be expected to participate in at least 3 hours of rehabilitation therapies Monday-Friday, with modified therapy programming on the weekends.  Your rehabilitation program will include the following services:  Physical Therapy (PT), Occupational Therapy (OT), Speech Therapy (ST), 24 hour per day rehabilitation nursing, Therapeutic Recreaction (TR), Neuropsychology, Case Management (Social Worker), Rehabilitation Medicine, Nutrition Services and Pharmacy Services  Weekly team conferences will be held on Tuesdays to discuss your progress.  Your Social Worker will talk with you frequently to get your input and to update you on team discussions.  Team conferences with you and your family in attendance may also be held.  Expected length of stay: 14-16 days  Overall anticipated outcome: supervision  Depending on your progress and recovery, your program may change. Your Social Worker will coordinate services and will keep you informed of any changes. Your Social Worker's name and contact numbers are listed  below.  The following services may also be recommended but are not provided by the [facility] will be made to provide these services after discharge if needed.  Arrangements include referral to agencies that provide these services.  Your insurance has been verified to be:  [facility] Medicare Your primary doctor is:  Dr. Alain Marion  Pertinent information will be shared with your doctor and your insurance  company.  Social Worker:  [place], [place] or ([phone]   Information discussed with and copy given to patient by: Lennart Pall, 10/26/2016, 3:18 PM

## 2016-10-26 NOTE — Progress Notes (Signed)
Occupational Therapy Session Note  Patient Details  Name: Joe [name] MRN: [id number] Date of Birth: [birth year]  Today's Date: 10/26/2016 OT Individual Time: [id number] OT Individual Time Calculation (min): 70 min    Short Term Goals: Week 1:  OT Short Term Goal 1 (Week 1): Pt will be able to complete stand pivot to toilet with min A. OT Short Term Goal 2 (Week 1): Pt will complete toileting tasks with min A to support balance. OT Short Term Goal 3 (Week 1): Pt will be able to groom with min A demonstrating improved FMC. OT Short Term Goal 4 (Week 1): Pt will be independent with [facility] HEP to use in his room.  Skilled Therapeutic Interventions/Progress Updates:    Pt seen for OT session focusing on ADL re-training and fine motor coordination/ UE strengthening. Pt sitting up in w/c upon arrival witth RN present administering medications. With encouragement, pt agreeable to tx session. He donned pants seated in w/c using reacher to assist with threading. He stood at [facility] with min A, able to pull pants up 75% of away and requiring assist for remainder of way as pt becoming increasingly frustrated with task.  He declined further bathing/grooming tasks this morning.  In therapy gym, pt completed fine motor activity utilizing B UEs, required to pick up and manipulate small animal figurines- completed with increased time and rest breaks throughout. Then completed small peg board activity, required to manipulate and place/ remove small pegs. Pt required increased time, however, demonstrated ability to manage pegs with and without textured grip placed on peg ends. Completed reaching activity where from seated position pt was required to place horseshoes on overhead basketball rim working on shoulder strengthening and ROM in R UE. Required increased time/ effort and also noted to have compensatory movements in R shoulder to facilitate movement.  Pt self propelled w/c ~30ft back towards room at end of  session for UE strengthening, however, fatigued quickly and requesting assist back to room. Pt left seated in w/c, all needs in reach awaiting PT.    Therapy Documentation Precautions:  Precautions Precautions: Fall Restrictions Weight Bearing Restrictions: No Pain:   No/ denies pain ADL: ADL ADL Comments: refer to functional navigator  See Function Navigator for Current Functional Status.   Therapy/Group: Individual Therapy  Lewis, Madhavi Hamblen C 10/26/2016, 7:07 AM

## 2016-10-26 NOTE — Progress Notes (Signed)
Pt had 2 incontinent of bowel episodes.  Pt does have redness to butt cheeks, w/slight abrasion 0.5cm to left buttock. Pt c/o pain and tenderness to rectal area where he states his hemorrhoids are hurting him.  Pt was encouraged to stay off his bottom and rotate from side to side, which he has done. Barrier cream and tucks have been applied.    Rayann Heman

## 2016-10-26 NOTE — Progress Notes (Signed)
Physical Therapy Session Note  Patient Details  Name: Joe [name] MRN: [id number] Date of Birth: [birth year]  Today's Date: 10/26/2016 PT Individual Time: 1100-1130 and 1430-1530  PT Individual Time Calculation (min): 30 min and 60 min (total 90 min)  Short Term Goals: Week 1:  PT Short Term Goal 1 (Week 1): Pt will perform bed mobility minA PT Short Term Goal 2 (Week 1): Pt will perform stand pivot transfers minA PT Short Term Goal 3 (Week 1): Pt will ambulate 30' minA with LRAD PT Short Term Goal 4 (Week 1): Pt will initiate stair training  Skilled Therapeutic Interventions/Progress Updates: Tx 1: Pt received seated in w/c after returning from xray; c/o significant fatigue stating "I can't do nothing right now, you girls are working me too hard". Pt reports he was incontinent of bowel when down for xray, and needs to be changed. Pt refusing to stand, stating his legs will give out. Squat pivot transfer to return to bed with close S and min cues for technique. Sit >supine minA for LLE management. Rolling R/L with S and bedrails while therapist performed hygiene and clothing management totalA. Throughout session, pt verbalizing concern that he is getting worse, stating he "isn't here to do work, I'm just here to get my arms and legs better". Discussed with pt that the best way to progress towards increased strength and independence was to participate in therapy, that refusing therapy sessions and spending all day in bed would further contribute to increasing weakness and dependence. Educated pt that sessions can be modified to adapt to pt's energy level but that activities must be progressive in nature and challenge him outside of his comfort zone yet remain within reason. Pt verbalizes understanding, states that right now he is too tired "from getting up and down for that xray" and is not willing to participate. Reports he will rest and be prepared for afternoon SLP and PT sessions. RN alerted to  pt condom catheter near falling off and pt concerned. Remained supine in bed, alarm intact and all needs in reach at end of session. Missed 30 min PT d/t pt refusal and fatigue.  tx 2: Pt received supine in bed, denies pain and initially resistant to participation in therapy however ultimately agreeable with extensive education and encouragement. Supine>sit with S, bedrails and increased time. Stand pivot bed>w/c with minA/min guard. W/c propulsion x100' with BUE and S before fatigued. Stand pivot w/c >mat table with min guard. Sit <>stand x5 reps with min guard and RW. Attempted gait x5' before pt reporting he is having a bowel movement and needs to sit. Returned to room totalA. Stand pivot transfer w/c <>toilet with min guard and grab bars. Therapist performed clothing management and hygiene totalA. Educated pt in importance of bowel program to ensure complete emptying and prevent repetitive accidents throughout the day; pt very resistant stating he doesn't want anything else to help him use the bathroom and "If you're going to keep getting me up you're just going to have to keep cleaning me". Discussed with pt that laying in bed to avoid having accidents was not productive or beneficial to pt's recovery, and that he would greatly benefit from bowel program/daily schedule to help empty and allow for greater participation in therapy and progress towards functional goals. Pt returned to bed at end of session minA stand pivot and S sit >supine. Discussed importance of increasing OOB tolerance; pt verbalizes understanding but states he is too tired right now to sit up.  Remained supine in bed at end of session, all needs in reach.      Therapy Documentation Precautions:  Precautions Precautions: Fall Restrictions Weight Bearing Restrictions: No General: PT Amount of Missed Time (min): 30 Minutes PT Missed Treatment Reason: Patient unwilling to participate;Patient fatigue Pain: Pain Assessment Pain  Assessment: 0-10 Pain Score: 4  Pain Location: Buttocks Pain Orientation: Right;Left Pain Onset: Gradual Patients Stated Pain Goal: 2 Pain Intervention(s): Repositioned   See Function Navigator for Current Functional Status.   Therapy/Group: Individual Therapy  Luberta Mutter 10/26/2016, 11:34 AM

## 2016-10-26 NOTE — Progress Notes (Signed)
Subjective/Complaints: Sitting in w/c. Frantic about frequent bm's and tingling in hands. Stools not loose. Received bowel interventions yesterday  ROS: pt denies nausea, vomiting,  cough, shortness of breath or chest pain  Objective: Vital Signs: Blood pressure 120/78, pulse (!) 50, temperature 98.2 F (36.8 C), temperature source Oral, resp. rate 18, SpO2 94 %. No results found. No results found for this or any previous visit (from the past 72 hour(s)).   HEENT: normocephalic, atraumatic.  Cardio: RRR Resp: CTA B/L and Unlabored GI: BS+, no distention Skin:   Breakdown Gr 1 sacral in place Neuro: Alert/Oriented Sensation diminished to light touch b/l hands to shoulders, hands most involved Motor 4+/5 bilateral delt , bi, tri , grip, HF, KE, ADF  Musc/Skel:    No tenderness.  Gen NAD.      Assessment/Plan: 1. 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 Physiatrist is providing close team supervision and 24 hour management of active medical problems listed below. Physiatrist and rehab team continue to assess barriers to discharge/monitor patient progress toward functional and medical goals. FIM: Function - Bathing Position: Shower Body parts bathed by patient: Chest, Abdomen, Right arm, Left arm, Front perineal area, Right upper leg, Left upper leg Body parts bathed by helper: Buttocks, Right lower leg, Left lower leg, Back Assist Level: Touching or steadying assistance(Pt > 75%)  Function- Upper Body Dressing/Undressing What is the patient wearing?: Pull over shirt/dress Pull over shirt/dress - Perfomed by patient: Thread/unthread right sleeve, Thread/unthread left sleeve, Pull shirt over trunk, Put head through opening Pull over shirt/dress - Perfomed by helper: Put head through opening Assist Level: Set up Set up : To obtain clothing/put away Function - Lower  Body Dressing/Undressing What is the patient wearing?: Pants, Non-skid slipper socks Position: Sitting EOB Pants- Performed by patient: Thread/unthread right pants leg, Thread/unthread left pants leg Pants- Performed by helper: Pull pants up/down Non-skid slipper socks- Performed by patient: Don/doff right sock, Don/doff left sock (Sock Aid) Non-skid slipper socks- Performed by helper: Don/doff right sock, Don/doff left sock Assist for footwear: Supervision/touching assist Assist for lower body dressing: Touching or steadying assistance (Pt > 75%)  Function - Toileting Toileting activity did not occur: No continent bowel/bladder event Toileting steps completed by helper: Adjust clothing prior to toileting, Performs perineal hygiene, Adjust clothing after toileting Toileting Assistive Devices: Grab bar or rail  Function - Air cabin crew transfer activity did not occur: Safety/medical concerns Toilet transfer assistive device: Elevated toilet seat/BSC over toilet, Grab bar Assist level to toilet: Moderate assist (Pt 50 - 74%/lift or lower) Assist level from toilet: Moderate assist (Pt 50 - 74%/lift or lower)  Function - Chair/bed transfer Chair/bed transfer method: Stand pivot Chair/bed transfer assist level: Moderate assist (Pt 50 - 74%/lift or lower) Chair/bed transfer assistive device: Armrests, Walker Chair/bed transfer details: Verbal cues for precautions/safety, Verbal cues for technique, Tactile cues for placement, Tactile cues for posture  Function - Locomotion: Wheelchair Max wheelchair distance: 75 Assist Level: Supervision or verbal cues Assist Level: Supervision or verbal cues Wheel 150 feet activity did not occur: Safety/medical concerns (fatigue) Turns around,maneuvers to table,bed, and toilet,negotiates 3% grade,maneuvers on rugs and over doorsills: No Function - Locomotion: Ambulation Assistive device: Walker-rolling Assist level: Moderate assist (Pt 50 -  74%) Assist level: Moderate assist (Pt 50 - 74%) Walk 50 feet with 2 turns activity did not occur: Safety/medical concerns Walk 150 feet activity did not occur:  Safety/medical concerns Walk 10 feet on uneven surfaces activity did not occur: Safety/medical concerns  Function - Comprehension Comprehension: Auditory Comprehension assist level: Understands complex 90% of the time/cues 10% of the time  Function - Expression Expression: Verbal Expression assist level: Expresses complex 90% of the time/cues < 10% of the time  Function - Social Interaction Social Interaction assist level: Interacts appropriately 90% of the time - Needs monitoring or encouragement for participation or interaction.  Function - Problem Solving Problem solving assist level: Solves complex 90% of the time/cues < 10% of the time  Function - Memory Memory assist level: Recognizes or recalls 90% of the time/requires cueing < 10% of the time Patient normally able to recall (first 3 days only): Current season, Location of own room, Staff names and faces, That he or she is in a hospital  Medical Problem List and Plan: 1. 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 DVT Prophylaxis/Anticoagulation: SCDs.   Vascular study neg for DVT. 3. Pain Management: Neurontin 100 mg 3 times a day, hydrocodone as needed  Encouraged taking medications if needed 4. Mood: Provide emotional support 5. Neuropsych: This patient iscapable of making decisions on hisown behalf. 6. Skin/Wound Care: Routine skin checks 7. Fluids/Electrolytes/Nutrition: Routine I&Os , BUN/Creat ok,   -encourage po 8.Dysphagia. Dysphagia #1 pudding thick liquids. Follow-up speech therapy.IV fluids daily at bedtime 9.CAD status post CABG. No chest pain or shortness of breath. Discusswith neurosurgery onresuming aspirin 81 mg daily 10.Hypertension. Cozaar 100 mg daily, Lasix 20 mg twice a day. Monitor  with increased mobility Vitals:   10/25/16 1302 10/26/16 0559  BP: (!) 108/55 120/78  Pulse: 64 (!) 50  Resp: 17 18  Temp: 97.9 F (36.6 C) 98.2 F (36.8 C)   Control improved 11.MRSA PCR screening positive. Contact precautions 12.Sinusitis.Unasyn changed to augmentin 12.Hyponatremia. Na+ 132 on 3/2 13.Tobacco abuse. 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 Hemorrhoids  Tucks ordered 3/4  LOS (Days) 4 A FACE TO FACE EVALUATION WAS PERFORMED  Skya Mccullum T 10/26/2016, 8:41 AM
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
## 2016-10-26 NOTE — Progress Notes (Signed)
Speech Language Pathology Daily Session Note  Patient Details  Name: Joe [name] MRN: [id number] Date of Birth: 01-11-1946  Today's Date: 10/26/2016 SLP Individual Time: 1300-1345 SLP Individual Time Calculation (min): 45 min  Short Term Goals: Week 1: SLP Short Term Goal 1 (Week 1): Given supervision cues pt will use compensatory swallow strategies when consuming current diet to reduce aspiration risk.  SLP Short Term Goal 2 (Week 1): Pt will consume trials of ice chips and small sips of thin liquids without overt s/s of aspiration in preparation for further instrumental swallow study.   Skilled Therapeutic Interventions: Skilled treatment session focused on dysphagia goals. SLP facilitated session by providing skilled observation of ice chips after oral care. Pt with overt s/s of aspiration with each bolus for 100% of opportunities. Pt with wet vocal quality in 50% of opportunity and coughing 50% of other opportunities.This appears to be an increase in pharyngeal sensation which would be indicative of need for follow up Modified Barium Swallow study. Will plan at next available time. Pt left upright in bed and all needs within reach.      Function:  Eating Eating   Modified Consistency Diet: Yes Eating Assist Level: Set up assist for;Supervision or verbal cues   Eating Set Up Assist For: Opening containers       Cognition Comprehension Comprehension assist level: Understands complex 90% of the time/cues 10% of the time  Expression   Expression assist level: Expresses complex 90% of the time/cues < 10% of the time  Social Interaction Social Interaction assist level: Interacts appropriately 90% of the time - Needs monitoring or encouragement for participation or interaction.  Problem Solving Problem solving assist level: Solves complex 90% of the time/cues < 10% of the time  Memory Memory assist level: Recognizes or recalls 90% of the time/requires cueing < 10% of the time     Pain    Therapy/Group: Individual Therapy   Tristina Sahagian B. Rutherford Nail, M.S., CCC-SLP Speech-Language Pathologist   Janard Culp [place] 10/26/2016, 1:35 PM

## 2016-10-26 NOTE — Plan of Care (Signed)
Problem: SCI BOWEL ELIMINATION Goal: RH STG MANAGE BOWEL WITH ASSISTANCE STG Manage Bowel with min Assistance.   Outcome: Not Progressing Pt having formed incontinent stool. Pt encouraged to take stool softners or suppository. Educated pt on need to establish bowel program to fully empty bowel to avoid incontinence. Pt refusing all stool softners and laxatives at this time.

## 2016-10-26 NOTE — Progress Notes (Signed)
Calorie Count Note  48 hour calorie count ordered.  Diet: Dysphagia 1 with pudding thick liquids. Supplements:   30 ml Prostat po BID, each supplement provides 100 kcal and 15 grams of protein.   Magic cup TID between meals, each supplement provides 290 kcal and 9 grams of protein  Day 1: Breakfast: 347 kcal, 10 grams of protein Lunch: 195 kcal, 15 grams of protein Dinner: 158 kcal, 10 grams of protein Supplements: 853 kcal, 50 grams of protein  Day 1 Total intake: 1553 kcal (74% of minimum estimated needs)  85 grams of protein (77% of minimum estimated needs)  Day 2: Breakfast: 190 kcal, 2 grams of protein Lunch: 22 kcal, 3 grams of protein Dinner: 172 kcal, 10 grams of protein Supplements: 345 kcal, 35 grams of protein  Day 2 Total intake: 729 kcal (35% of minimum estimated needs)  50 grams of protein (45% of minimum estimated needs)  Estimated Nutritional Needs:  Kcal:  2100-2300 Protein:  110-130 grams Fluid:  2.1 - 2.3 L/day  Meal completion has been varied from 10-100% with 50% at breakfast this AM. Pt currently has Prostat ordered and has been consuming them. RD to increase Prostat to TID to aid in adequate protein. Continue to provide Magic cup between meals to aid in adequate nutrition. RD to continue to monitor.   Nutrition Dx:  Increased nutrient needs related to  (therapy) as evidenced by estimated needs; ongoing  Goal:  Pt to meet >/= 90% of their estimated nutrition needs; progressing   Intervention:   Discontinue calorie count.  Continue 30 ml Prostat po TID, each supplement provides 100 kcal and 15 grams of protein.   Continue Magic cup TID between meals, each supplement provides 290 kcal and 9 grams of protein.  Corrin Parker, MS, RD, LDN Pager # [phone] After hours/ weekend pager # [phone]

## 2016-10-26 NOTE — Plan of Care (Signed)
Problem: SCI BOWEL ELIMINATION Goal: RH STG MANAGE BOWEL WITH ASSISTANCE STG Manage Bowel with min Assistance.   Outcome: Not Progressing Pt. Was incotinent this shift.

## 2016-10-27 ENCOUNTER — Inpatient Hospital Stay (HOSPITAL_COMMUNITY): Payer: Medicare Other | Admitting: Physical Therapy

## 2016-10-27 ENCOUNTER — Inpatient Hospital Stay (HOSPITAL_COMMUNITY): Payer: Medicare Other

## 2016-10-27 ENCOUNTER — Inpatient Hospital Stay (HOSPITAL_COMMUNITY): Payer: Medicare Other | Admitting: Occupational Therapy

## 2016-10-27 ENCOUNTER — Inpatient Hospital Stay (HOSPITAL_COMMUNITY): Payer: Medicare Other | Admitting: Speech Pathology

## 2016-10-27 DIAGNOSIS — K592 Neurogenic bowel, not elsewhere classified: Secondary | ICD-10-CM

## 2016-10-27 MED ORDER — DEXAMETHASONE 4 MG PO TABS
4.0000 mg | ORAL_TABLET | Freq: Three times a day (TID) | ORAL | Status: DC
  Administered 2016-10-27 – 2016-10-29 (×6): 4 mg via ORAL
  Filled 2016-10-27 (×6): qty 1

## 2016-10-27 MED ORDER — BISACODYL 10 MG RE SUPP
10.0000 mg | Freq: Every day | RECTAL | Status: DC
  Administered 2016-10-27 – 2016-11-06 (×5): 10 mg via RECTAL
  Filled 2016-10-27 (×7): qty 1

## 2016-10-27 MED ORDER — ASPIRIN 81 MG PO CHEW
81.0000 mg | CHEWABLE_TABLET | Freq: Every day | ORAL | Status: DC
  Administered 2016-10-27 – 2016-11-11 (×16): 81 mg via ORAL
  Filled 2016-10-27 (×16): qty 1

## 2016-10-27 NOTE — Plan of Care (Signed)
Problem: SCI BOWEL ELIMINATION Goal: RH STG MANAGE BOWEL WITH ASSISTANCE STG Manage Bowel with min Assistance.   Outcome: Not Progressing Total assist-   Problem: SCI BLADDER ELIMINATION Goal: RH STG MANAGE BLADDER WITH ASSISTANCE STG Manage Bladder With Min Assistance   Outcome: Not Progressing Wears condom cath

## 2016-10-27 NOTE — Progress Notes (Signed)
Physical Therapy Note  Patient Details  Name: Joe [name] MRN: [id number] Date of Birth: [birth year] Today's Date: 10/27/2016    Time: 1100-1124 24 minutes  1:1 No c/o pain. Pt c/o fatigue and states he is not willing to get out of bed. Pt states he is willing to participate in bed level exercises. Pt performed 2 x 10 supine SLR, SAQ, ankle pumps, hip abd/add, add squeeze, bridging. Pt states he does not want to work on his hands or fine motor activities due to having already done that this morning. Pt missed 35 minutes skilled PT.   Wayman Hoard 10/27/2016, 11:25 AM

## 2016-10-27 NOTE — Progress Notes (Signed)
Patient given Dulcolax this a.m about 1200. Patient has had three smi formed bowel movements incontinent. Last bowel movement at time of this note was large incontinent. Silvestre Mesi PA notified of results. Per PA hold SSE at this time. Continue with plan of care.

## 2016-10-27 NOTE — Progress Notes (Signed)
Speech Language Pathology Note  Patient Details  Name: Joe [name] MRN: [id number] Date of Birth: 08-20-1946 Today's Date: 10/27/2016  MBSS complete, see full report under imaging section.  Claris Pech B. Rutherford Nail, M.S., [place] 10/27/2016, 12:12 PM

## 2016-10-27 NOTE — Progress Notes (Signed)
Occupational Therapy Session Note  Patient Details  Name: Joe [name] MRN: [id number] Date of Birth: [birth year]  Today's Date: 10/27/2016 OT Individual Time: 1300-1404 OT Individual Time Calculation (min): 64 min    Short Term Goals: Week 1:  OT Short Term Goal 1 (Week 1): Pt will be able to complete stand pivot to toilet with min A. OT Short Term Goal 2 (Week 1): Pt will complete toileting tasks with min A to support balance. OT Short Term Goal 3 (Week 1): Pt will be able to groom with min A demonstrating improved FMC. OT Short Term Goal 4 (Week 1): Pt will be independent with [facility] HEP to use in his room.  Skilled Therapeutic Interventions/Progress Updates:    Pt sitting on EOB to start session.  Resistant and agitated with complaints of bed discomfort as well as loose stools.  Stated he was tired and didn't want to do anymore therapy today.  Therapist eventually coaxed him to participate in OT session and he transferred to the bed with mod assist stand pivot.  Therapist transported pt to the therapy gym where he transferred stand pivot to the therapy mat with the RW with min assist.  Once sitting on the mat engaged pt in conversation regarding hobbies past and present while having him work on picking up resistive clothespins and placing them on vertical bars.  He needed assist of both UEs in order to position the clothespins in his hand in order to squeeze them using a modified lateral pinch.  Alternated hands every few repetitions, with slightly greater difficulty noted with the RUE compared to the left.  Pt reporting that he has difficulty with feeling in either hand so he has had difficulty manipulating items and can currently not perform toilet hygiene without assistance.  Pt requested to lay down secondary to fatigue so he transitioned to supine for a 2-3 minute rest break.  Upon returning to sitting discussed the need to return to the room to clean up from bowel incontinence.  In the room  had pt complete 4 intervals of standing for 1-2 mins at the sink with min assist in order for therapist to remove old brief and pants, complete bowel cleanup, and donn new brief and gown.  Once finished, pt transferred to bed with mod assist stand pivot.  Min assist for transition to supine to conclude session.  Pt left in bed with call button and phone in reach.    Therapy Documentation Precautions:  Precautions Precautions: Fall Restrictions Weight Bearing Restrictions: No  Vital Signs: Therapy Vitals Temp: 97.6 F (36.4 C) Temp Source: Oral Pulse Rate: 77 Resp: 18 BP: 121/67 Patient Position (if appropriate): Sitting Oxygen Therapy SpO2: 98 % O2 Device: Not Delivered Pain: Pain Assessment Pain Assessment: 0-10 Pain Score: 4  Pain Type: Acute pain Pain Location: Neck Pain Descriptors / Indicators: Discomfort Pain Onset: With Activity Pain Intervention(s): Repositioned;Emotional support ADL: See Function Navigator for Current Functional Status.   Therapy/Group: Individual Therapy  Kawana Hegel OTR/L 10/27/2016, 5:17 PM

## 2016-10-27 NOTE — Progress Notes (Signed)
Occupational Therapy Session Note  Patient Details  Name: Joe [name] MRN: [id number] Date of Birth: 17-Dec-1945  Today's Date: 10/27/2016 OT Individual Time: [phone] and 1430-1440 OT Individual Time Calculation (min): 60 min and 10 min OT Missed Time: 20 min (fatigue/ refuse)   Short Term Goals: Week 1:  OT Short Term Goal 1 (Week 1): Pt will be able to complete stand pivot to toilet with min A. OT Short Term Goal 2 (Week 1): Pt will complete toileting tasks with min A to support balance. OT Short Term Goal 3 (Week 1): Pt will be able to groom with min A demonstrating improved FMC. OT Short Term Goal 4 (Week 1): Pt will be independent with [facility] HEP to use in his room.  Skilled Therapeutic Interventions/Progress Updates:    Session One: Pt seen for OT ADL bathing/ dressing session. Pt sitting EOB upon arrival agreeable to tx session. He completed stand pivot transfer with RW and min A to w/c. He required increased assist for shower transfer due to poor RW management techniques and impulsivity/ decreased safety awareness during transfer. He bathed seated on tub bench with assist for LEs. He returned to w/c to complete dressing task, able to thread pants without use of AE. Pt requested return to bed for condom cath to be donned. Completed mod A squat to bed and returned to supine. Pt with incontinent BM upon squat pivot. Total A hygiene and to don new brief. Min A to pull pants up when rolling in bed.  Pt returned to w/c at end of session, left seated with all needs in reach.   Session Two: Pt in supine upon arrival with increased complaints of fatigue, stressing over MBS results, incontinent BM, hemorrhoid pain, and general frustration with how the day has progressed.  Extensive time, education and encouragement provided regarding importance of bowel programming, benefits of upright voiding (pt refused transfer to [facility] despite feeling as if he needed to void), importance of participation in  tx sessions/ ADLs, CIR, and d/c planning. Pt cont to refuse all OOB or EOB activities requesting to stay in supine.  +2 assist to pull pt up in bed and pt left in supine, all needs in reach, and bed alarm on. Placed "Do not disturb" sign outside pt's door per request.  Discussed with pt potential move to 15/7 therapy time. Will re-assess tomorrow and will discuss with remainder of tx team.  Pt missed 20 min of tx time.   Therapy Documentation Precautions:  Precautions Precautions: Fall Restrictions Weight Bearing Restrictions: No ADL: ADL ADL Comments: refer to functional navigator  See Function Navigator for Current Functional Status.   Therapy/Group: Individual Therapy  Lewis, [name] 10/27/2016, 7:08 AM

## 2016-10-27 NOTE — Progress Notes (Signed)
Subjective/Complaints: Had further stooling. Would like an enema. Asked when numbness will improve in hands  ROS: pt denies nausea, vomiting, diarrhea, cough, shortness of breath or chest pain   Objective: Vital Signs: Blood pressure 135/66, pulse 62, temperature 98.5 F (36.9 C), temperature source Oral, resp. rate 17, SpO2 97 %. Dg Abd 1 View  Result Date: 10/26/2016 CLINICAL DATA:  Abdominal distension.  Lower abdominal pain. EXAM: ABDOMEN - 1 VIEW COMPARISON:  12/28/2003 CT abdomen/pelvis. FINDINGS: No dilated small bowel loops or air-fluid levels. Retained barium contrast throughout the mid to distal large bowel. Moderate stool throughout the large bowel. No evidence of pneumatosis or pneumoperitoneum. Clear lung bases. Vascular calcifications. Otherwise no pathologic soft tissue calcifications. Moderate thoracolumbar spondylosis. IMPRESSION: Nonobstructive bowel gas pattern. Electronically Signed   By: Ilona Sorrel M.D.   On: 10/26/2016 11:06   No results found for this or any previous visit (from the past 72 hour(s)).   HEENT: normocephalic, atraumatic.  Cardio: RRR Resp:CTA B GI: BS+, sl distention. Non tender Skin:   Breakdown Gr 1 sacral in place Neuro: Alert/Oriented Sensation diminished to light touch b/l hands to shoulders, hands most involved, feet numb also Motor 4+/5 bilateral delt , bi, tri , grip, HF, KE, ADF  Musc/Skel:    No tenderness.  Psych: a little anxious Gen NAD.      Assessment/Plan: 1. 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 Physiatrist is providing close team supervision and 24 hour management of active medical problems listed below. Physiatrist and rehab team continue to assess barriers to discharge/monitor patient progress toward functional and medical goals. FIM: Function - Bathing Position: Shower Body parts bathed by patient:  Chest, Abdomen, Right arm, Left arm, Front perineal area, Right upper leg, Left upper leg Body parts bathed by helper: Buttocks, Right lower leg, Left lower leg, Back Assist Level: Touching or steadying assistance(Pt > 75%)  Function- Upper Body Dressing/Undressing What is the patient wearing?: Pull over shirt/dress Pull over shirt/dress - Perfomed by patient: Thread/unthread right sleeve, Thread/unthread left sleeve, Pull shirt over trunk, Put head through opening Pull over shirt/dress - Perfomed by helper: Put head through opening Assist Level: Set up Set up : To obtain clothing/put away Function - Lower Body Dressing/Undressing What is the patient wearing?: Pants, Non-skid slipper socks Position: Sitting EOB Pants- Performed by patient: Thread/unthread right pants leg, Thread/unthread left pants leg Pants- Performed by helper: Pull pants up/down Non-skid slipper socks- Performed by patient: Don/doff right sock, Don/doff left sock (Sock Aid) Non-skid slipper socks- Performed by helper: Don/doff right sock, Don/doff left sock Assist for footwear: Supervision/touching assist Assist for lower body dressing: Touching or steadying assistance (Pt > 75%)  Function - Toileting Toileting activity did not occur: No continent bowel/bladder event Toileting steps completed by helper: Adjust clothing prior to toileting, Performs perineal hygiene, Adjust clothing after toileting Toileting Assistive Devices: Grab bar or rail Assist level: Touching or steadying assistance (Pt.75%) (per [employer], NT report)  Function - Air cabin crew transfer activity did not occur: Safety/medical concerns Toilet transfer assistive device: Elevated toilet seat/BSC over toilet, Grab bar Assist level to toilet: Moderate assist (Pt 50 - 74%/lift or lower) Assist level from toilet: Moderate assist (Pt 50 - 74%/lift or lower)  Function - Chair/bed transfer Chair/bed transfer method: Stand pivot Chair/bed  transfer assist level: Touching or steadying assistance (Pt > 75%) Chair/bed transfer assistive device: Armrests Chair/bed transfer details: Verbal  cues for precautions/safety, Verbal cues for technique, Tactile cues for placement, Tactile cues for posture  Function - Locomotion: Wheelchair Max wheelchair distance: 100 Assist Level: Supervision or verbal cues Assist Level: Supervision or verbal cues Wheel 150 feet activity did not occur: Safety/medical concerns (fatigue) Turns around,maneuvers to table,bed, and toilet,negotiates 3% grade,maneuvers on rugs and over doorsills: No Function - Locomotion: Ambulation Assistive device: Walker-rolling Max distance: 5 Assist level: Touching or steadying assistance (Pt > 75%) Assist level: Moderate assist (Pt 50 - 74%) Walk 50 feet with 2 turns activity did not occur: Safety/medical concerns Walk 150 feet activity did not occur: Safety/medical concerns Walk 10 feet on uneven surfaces activity did not occur: Safety/medical concerns  Function - Comprehension Comprehension: Auditory Comprehension assist level: Understands complex 90% of the time/cues 10% of the time  Function - Expression Expression: Verbal Expression assist level: Expresses complex 90% of the time/cues < 10% of the time  Function - Social Interaction Social Interaction assist level: Interacts appropriately 90% of the time - Needs monitoring or encouragement for participation or interaction.  Function - Problem Solving Problem solving assist level: Solves complex 90% of the time/cues < 10% of the time  Function - Memory Memory assist level: Recognizes or recalls 90% of the time/requires cueing < 10% of the time Patient normally able to recall (first 3 days only): Current season, Location of own room, Staff names and faces, That he or she is in a hospital  Medical Problem List and Plan: 1. 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 DVT Prophylaxis/Anticoagulation: SCDs.   Vascular study neg for DVT. 3. Pain Management: Neurontin 100 mg 3 times a day, hydrocodone as needed  Encouraged taking medications if needed 4. Mood: Provide emotional support 5. Neuropsych: This patient iscapable of making decisions on hisown behalf. 6. Skin/Wound Care: Routine skin checks 7. Fluids/Electrolytes/Nutrition: Routine I&Os , BUN/Creat ok,   -encourage po 8.Dysphagia. Dysphagia #1 pudding thick liquids. Follow-up speech therapy.continue IV fluids daily at bedtime 9.CAD status post CABG. No chest pain or shortness of breath. Discusswith neurosurgery onresuming aspirin 81 mg daily 10.Hypertension. Cozaar 100 mg daily, Lasix 20 mg twice a day. Monitor with increased mobility Vitals:   10/26/16 1440 10/27/16 0445  BP: (!) 148/69 135/66  Pulse: 79 62  Resp: 18 17  Temp: 98.1 F (36.7 C) 98.5 F (36.9 C)   Control improved 11.MRSA PCR screening positive. Contact precautions 12.Sinusitis.Unasyn changed to augmentin (duration?)---see no further signs, afebrile  -dc abx 12.Hyponatremia. Na+ 132 on 3/2 13.Tobacco abuse. 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 Personally reviewed  -miralax bid  -sse today-   -daily dulcolax supp starting tomorrow    15. Hemorrhoids  Tucks ordered 3/4  LOS (Days) 5 A FACE TO FACE EVALUATION WAS PERFORMED  Zhoe Catania T 10/27/2016, 8:38 AM

## 2016-10-28 ENCOUNTER — Inpatient Hospital Stay (HOSPITAL_COMMUNITY): Payer: Medicare Other | Admitting: Speech Pathology

## 2016-10-28 ENCOUNTER — Inpatient Hospital Stay (HOSPITAL_COMMUNITY): Payer: Medicare Other | Admitting: Occupational Therapy

## 2016-10-28 ENCOUNTER — Inpatient Hospital Stay (HOSPITAL_COMMUNITY): Payer: Medicare Other | Admitting: Physical Therapy

## 2016-10-28 NOTE — Progress Notes (Signed)
Speech Language Pathology Daily Session Note  Patient Details  Name: Joe [name] MRN: [id number] Date of Birth: 01-Jun-1946  Today's Date: 10/28/2016 SLP Individual Time: 1330-1430 SLP Individual Time Calculation (min): 60 min  Short Term Goals: Week 1: SLP Short Term Goal 1 (Week 1): Given supervision cues pt will use compensatory swallow strategies when consuming current diet to reduce aspiration risk.  SLP Short Term Goal 2 (Week 1): Pt will consume trials of ice chips and small sips of thin liquids without overt s/s of aspiration in preparation for further instrumental swallow study.   Skilled Therapeutic Interventions: Skilled treatment session focused on dysphagia goals. SLP facilitated session by providing extensive education on swallow deficits with review of Modified Barium Swallow Study images with pt. Pt able to demonstrate immediate understanding but then referred to "good/bad side of swallow." Repeated education re: swallow dysfunction. Pt consumed 2 bites of honey with coughing and wet voice which pt attempts to talk through instead of following compensatory swallow strategies. Pt required Max A to ,Mod A for use of swallow strategies and to stop talking while attempting to use. Pt was left upright in wheelchair with all needs within reach. Continue per current plan of care.      Function:  Eating Eating   Modified Consistency Diet: Yes Eating Assist Level: Set up assist for;Supervision or verbal cues   Eating Set Up Assist For: Opening containers;Cutting food       Cognition Comprehension Comprehension assist level: Understands complex 90% of the time/cues 10% of the time  Expression   Expression assist level: Expresses complex 90% of the time/cues < 10% of the time  Social Interaction Social Interaction assist level: Interacts appropriately 90% of the time - Needs monitoring or encouragement for participation or interaction.  Problem Solving Problem solving assist  level: Solves complex 90% of the time/cues < 10% of the time  Memory Memory assist level: Recognizes or recalls 90% of the time/requires cueing < 10% of the time    Pain    Therapy/Group: Individual Therapy   Gilad Dugger B. Rutherford Nail, M.S., [place] 10/28/2016, 4:30 PM

## 2016-10-28 NOTE — Progress Notes (Signed)
Occupational Therapy Note  Patient Details  Name: Joe [name] MRN: [id number] Date of Birth: 03/07/1946  Today's Date: 10/28/2016 OT Individual Time: 1120-1203 OT Individual Time Calculation (min): 43 min   No c/o pain. Pt seen this session to facilitate UE strength.  Grasp strength per dynameter is 40 lbs in R and 38 lbs in L. Pt with signifant c/o frustration over lack of sensation in hands.  He spent a great deal of time talking about his frustrations with his medical status and being a patient. Numerous attempts to redirect pt to focusing on his rehab.  Therapeutic Ex:  Shoulder ROM slides on table Push and pull ex with RW Pinch grasp on resistive clothespins Sit to stand with close S but for less than 1 minute at a time.  Pt resting in w/c with all needs met.    SAGUIER,JULIA 10/28/2016, 12:32 PM   

## 2016-10-28 NOTE — Progress Notes (Signed)
Physical Therapy Session Note  Patient Details  Name: Joe [name] MRN: [id number] Date of Birth: Aug 18, 1946  Today's Date: 10/28/2016 PT Individual Time: 1000-1100 PT Individual Time Calculation (min): 60 min   Short Term Goals: Week 1:  PT Short Term Goal 1 (Week 1): Pt will perform bed mobility minA PT Short Term Goal 2 (Week 1): Pt will perform stand pivot transfers minA PT Short Term Goal 3 (Week 1): Pt will ambulate 30' minA with LRAD PT Short Term Goal 4 (Week 1): Pt will initiate stair training  Skilled Therapeutic Interventions/Progress Updates: Pt received seated in w/c, denies pain and agreeable to treatment with encouragement. Upon leaving room, pt noted to have had incontinent bowel movement. Sit <>stand with close S/min guard with RW and standing tolerance approximately 2 min at a time while therapist performing hygiene totalA; seated rest breaks when pt fatigued, 4 trials total of standing to complete hygiene. Standing marching 3 trials at approx 1 min each with min guard. Pt requires cues to back up to w/c prior to sitting for safety; reports he cannot feel his legs however is able to identify when catheter strap or brief are falling so therapist encouraged pt to be more conscious of LE placement prior to sitting. PROM BUEs shoulder flexion, ER/IR d/t pt c/o pain and "tightness". Remained seated in w/c at end of session, all needs in reach.      Therapy Documentation Precautions:  Precautions Precautions: Fall Restrictions Weight Bearing Restrictions: No   See Function Navigator for Current Functional Status.   Therapy/Group: Individual Therapy  Luberta Mutter 10/28/2016, 10:47 AM
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
## 2016-10-28 NOTE — Progress Notes (Signed)
Occupational Therapy Session Note  Patient Details  Name: Joe [name] MRN: [id number] Date of Birth: [birth year]  Today's Date: 10/28/2016 OT Individual Time: 5038-8828 OT Individual Time Calculation (min): 75 min    Short Term Goals: Week 1:  OT Short Term Goal 1 (Week 1): Pt will be able to complete stand pivot to toilet with min A. OT Short Term Goal 2 (Week 1): Pt will complete toileting tasks with min A to support balance. OT Short Term Goal 3 (Week 1): Pt will be able to groom with min A demonstrating improved FMC. OT Short Term Goal 4 (Week 1): Pt will be independent with [facility] HEP to use in his room.  Skilled Therapeutic Interventions/Progress Updates:    Pt seen for OT session focusing on ADL re-training and fine motor coordination. Pt in supine upon arrival, having just finished breakfast, and with encouragement agreeable to tx session. He donned shirt seated EOB with min A, required VCs for technique and frustration management when pt not successful on first trial.  Completed min A stand pivot transfer to w.c with min A and VCs and assist for RW management. Grooming tasks completed from w/c level at sink, increased time to manage containers. Shaving completed total A using electric razor. In therapy gym, pt completed stand pivot transfer to therapy mat in same manner as described above. Seated EOM, pt completed nut and bolt activity utilizing B UEs. Pt needed VCs and Min A to stabilize bolts and nuts due to sensory deficits in hands. He utilized vision to help compensate for sensory deficits in his hands. Pt needed rest breaks throughout session for fatigue and frustration. Pt described pain throughout his shoulders during activity- VCs for posture and education provided regarding compensatory strategies used thus causing increased shoulder stress/ discomfort. Pt returned in w/c total A at end of session, left seated in w/c with all needs in reach.    Therapy  Documentation Precautions:  Precautions Precautions: Fall Restrictions Weight Bearing Restrictions: No ADL: ADL ADL Comments: refer to functional navigator  See Function Navigator for Current Functional Status.   Therapy/Group: Individual Therapy  Lewis, Robynn Marcel C 10/28/2016, 7:09 AM

## 2016-10-28 NOTE — Patient Care Conference (Signed)
Inpatient RehabilitationTeam Conference and Plan of Care Update Date: 10/27/2016   Time: 2:20 PM    Patient Name: Joe [name]      Medical Record Number: [id number]  Date of Birth: 1945-12-08 Sex: Male         Room/Bed: 4M07C/4M07C-01 Payor Info: Payor: [employer] / Plan: [facility] MEDICARE / Product Type: *No Product type* /    Admitting Diagnosis: Cervical Mylopathy  Admit Date/Time:  10/22/2016  5:44 PM Admission Comments: No comment available   Primary Diagnosis:  Tetraparesis ([place]) Principal Problem: Tetraparesis [facility])  Patient Active Problem List   Diagnosis Date Noted  . Hemorrhoids   . Neurogenic bowel   . Sinusitis   . Benign essential HTN   . Tetraparesis ([place]) 10/22/2016  . Dysphagia, oropharyngeal   . Cervical myelopathy ([place])   . Vertigo   . Coronary artery disease involving coronary bypass graft of native heart without angina pectoris   . Hyponatremia   . Leukocytosis   . Hoarseness 10/17/2016  . Weakness 10/17/2016  . Weakness of distal arms and legs   . Quadriplegia and quadriparesis ([place]) 10/06/2016  . FTT (failure to thrive) in adult 10/06/2016  . Polyneuropathy ([place]) 10/06/2016  . Urinary incontinence 10/06/2016  . Falls 10/06/2016  . Weight gain 03/03/2016  . Morbid obesity ([place]) 01/15/2015  . Left wrist pain 07/17/2014  . Well adult exam 04/16/2014  . Blurred vision 09/25/2013  . Leg pain, bilateral 05/09/2012  . Left groin pain 10/07/2011  . Sinusitis, acute 05/01/2011  . Tobacco abuse 12/03/2010  . Hyperlipidemia   . Constipation 10/23/2010  . NEOPLASM OF UNCERTAIN BEHAVIOR OF SKIN 08/08/2010  . ECZEMA 08/08/2010  . SHOULDER PAIN 07/16/2010  . WRIST PAIN 06/19/2010  . NECK PAIN 06/19/2010  . Pain in limb 06/19/2010  . PARESTHESIA 06/19/2010  . Headache(784.0) 06/19/2010  . CONCUSSION WITH LOSS OF CONSCIOUSNESS 06/19/2010  . Hypogonadism in male 10/07/2009  . B12 deficiency 02/21/2009  . Essential hypertension 10/15/2008  .  VISUAL IMPAIRMENT 06/18/2008  . Vitamin D deficiency 03/13/2008  . Pain in joint 03/13/2008  . RECTAL BLEEDING 01/02/2008  . CHANGE IN BOWELS 01/02/2008  . COLONIC POLYPS, HX OF 12/12/2007  . Anxiety state 10/10/2007  . Essential tremor 09/12/2007  . CARPAL TUNNEL SYNDROME 09/12/2007  . Coronary atherosclerosis 09/12/2007  . PVD (peripheral vascular disease) ([place]) 09/12/2007  . VERTIGO 09/12/2007  . SNORING 09/12/2007  . BRONCHITIS, ACUTE 06/30/2007  . Actinic keratosis 06/30/2007  . Allergic rhinitis 05/11/2007  . COPD mixed type ([place]) 05/11/2007  . OSTEOARTHRITIS 05/11/2007    Expected Discharge Date: Expected Discharge Date: 11/11/16  Team Members Present: Physician leading conference: Dr. Alger Simons Social Worker Present: Lennart Pall, LCSW Nurse Present: Dorien Chihuahua, RN PT Present: Canary Brim, Harriet Pho, PT OT Present: Napoleon Form, OT SLP Present: Stormy Fabian, SLP PPS Coordinator present : Daiva Nakayama, RN, CRRN     Current Status/Progress Goal Weekly Team Focus  Medical   cervical myelopathy. ongoing motor/sensory deficits. neurogenic bowel. anxiety  improve activity tolerance  bowel regimen, pain control, SCI education   Bowel/Bladder   Incontinent of bowels. Can be continent/incontinent of bladder. Last BM 3/5  continent of both bowel and bladder.  Monitor for changes in bowel and bladder habits.    Swallow/Nutrition/ Hydration   Min A with Dysphagia 1 with pudding thick liquids  Min A with least restrictive diet  completion of Modified Barium Swallow study, utilization of compensatory swallow strategies   ADL's  Mod-max A LB dressing; Min A UB dressing; min-mod A functional transfers; set-up self feeding/ grooming; total A toileting  mod I eating and grooming; set up bathing and dressing, supervision toileting and ADL transfers  FM coordination; functional standing balance/ endurance; ADL re-training; AE training   Mobility   S bed mobility,  minA transfers, gait x5-10' min/modA with w/c follow; limited by lack of participation in bowel program, frequent incontinence  modI bed mobility and transfers, S gait x50' LRAD and 3 stairs for home entry  activity tolerance, LE strengthening, transfer/gait training, education regarding goals/purpose of therapy to increase participation    Communication             Safety/Cognition/ Behavioral Observations            Pain   Denies pain this shift.  Pain less than or equal to 2.  Assess for pain q shift and prn.   Skin   Redness to buttocks, clean dry and intact.  No skin breakdown while in rehab.  Monitor skin q shift and prn.    Rehab Goals Patient on target to meet rehab goals: Yes *See Care Plan and progress notes for long and short-term goals.  Barriers to Discharge: anxiety, pain, inconsistent bowel habits    Possible Resolutions to Barriers:  NMR, SCI education, adaptive equipment    Discharge Planning/Teaching Needs:  Plan home with family to arrange 24/7 assistance.  Teaching to be scheduled prior to d/c   Team Discussion:  Bowel issues continue.  MBS today showing severe swelling and penetration to cords.  Still on D1 with pudding thick.  Pt doesn't want condom cath removed.  MD and RNing continue to stress need to be more proactive with b/b management.  Mod assist overall with OT;  Spastic; hands very sensory impaired.  NO frustration tolerance which usually leads to him stopping sessions early.    Revisions to Treatment Plan:  None   Continued Need for Acute Rehabilitation Level of Care: The patient requires daily medical management by a physician with specialized training in physical medicine and rehabilitation for the following conditions: Daily direction of a multidisciplinary physical rehabilitation program to ensure safe treatment while eliciting the highest outcome that is of practical value to the patient.: Yes Daily medical management of patient stability for  increased activity during participation in an intensive rehabilitation regime.: Yes Daily analysis of laboratory values and/or radiology reports with any subsequent need for medication adjustment of medical intervention for : Neurological problems;Post surgical problems  Radley Barto 10/28/2016, 9:48 AM

## 2016-10-29 ENCOUNTER — Inpatient Hospital Stay (HOSPITAL_COMMUNITY): Payer: Medicare Other | Admitting: Physical Therapy

## 2016-10-29 ENCOUNTER — Inpatient Hospital Stay (HOSPITAL_COMMUNITY): Payer: Medicare Other | Admitting: Speech Pathology

## 2016-10-29 ENCOUNTER — Inpatient Hospital Stay (HOSPITAL_COMMUNITY): Payer: Medicare Other | Admitting: Occupational Therapy

## 2016-10-29 ENCOUNTER — Inpatient Hospital Stay (HOSPITAL_COMMUNITY): Payer: Medicare Other

## 2016-10-29 MED ORDER — GABAPENTIN 100 MG PO CAPS
200.0000 mg | ORAL_CAPSULE | Freq: Three times a day (TID) | ORAL | Status: DC
  Administered 2016-10-29 – 2016-11-02 (×14): 200 mg via ORAL
  Filled 2016-10-29 (×14): qty 2

## 2016-10-29 MED ORDER — ALPRAZOLAM 0.25 MG PO TABS
0.2500 mg | ORAL_TABLET | Freq: Three times a day (TID) | ORAL | Status: DC | PRN
  Filled 2016-10-29: qty 1

## 2016-10-29 MED ORDER — DEXAMETHASONE 4 MG PO TABS
4.0000 mg | ORAL_TABLET | Freq: Two times a day (BID) | ORAL | Status: DC
  Administered 2016-10-29 – 2016-11-02 (×9): 4 mg via ORAL
  Filled 2016-10-29 (×9): qty 1

## 2016-10-29 NOTE — Progress Notes (Signed)
Occupational Therapy Session Note  Patient Details  Name: Joe [name] MRN: [id number] Date of Birth: 1946-07-24  Today's Date: 10/29/2016 OT Individual Time: 6010-9323 OT Individual Time Calculation (min): 75 min    Short Term Goals: Week 1:  OT Short Term Goal 1 (Week 1): Pt will be able to complete stand pivot to toilet with min A. OT Short Term Goal 2 (Week 1): Pt will complete toileting tasks with min A to support balance. OT Short Term Goal 3 (Week 1): Pt will be able to groom with min A demonstrating improved FMC. OT Short Term Goal 4 (Week 1): Pt will be independent with [facility] HEP to use in his room.  Skilled Therapeutic Interventions/Progress Updates:    Pt seen for OT ADL bathing/dressing session. Pt sitting up in w/c upon arrival with RN present administering medications. Pt voiced pain all over with perseverating thorughout and able to be distracted from pain with functional activities.  Assisted pt into standing at [facility] for RN to apply medication in buttock, upon stand pt with large incontinent unformed BM. Pt agreeable to showering to clean from BM. Mod A stand pivots completed into/ out of shower. He required increased assist with RW management during functional ambulation/ turns with RW. He bathed seated on tub bench using LH sponge. Pt with incontinent BM in shower and following shower transferred to toilet to attempt continent void on toilet. He dressed seated on toilet, increased assist provided for frustration management as pt quick to become agitated when not successful on first trial during ADL tasks.  Grooming tasks completed at sink with increased time and encouragement for independence. Completed ice chip protocol following SLP orders, tolerated well. Pt left seated in w/c at end of session, all needs in reach.   Therapy Documentation Precautions:  Precautions Precautions: Fall Restrictions Weight Bearing Restrictions: No ADL: ADL ADL Comments: refer to functional  navigator  See Function Navigator for Current Functional Status.   Therapy/Group: Individual Therapy  Lewis, Daxtin Leiker C 10/29/2016, 7:07 AM

## 2016-10-29 NOTE — Progress Notes (Signed)
Physical Therapy Session Note  Patient Details  Name: Joe [name] MRN: [id number] Date of Birth: 1945-12-24  Today's Date: 10/29/2016 PT Individual Time: 1300-1400 PT Individual Time Calculation (min): 60 min   Short Term Goals: Week 1:  PT Short Term Goal 1 (Week 1): Pt will perform bed mobility minA PT Short Term Goal 2 (Week 1): Pt will perform stand pivot transfers minA PT Short Term Goal 3 (Week 1): Pt will ambulate 30' minA with LRAD PT Short Term Goal 4 (Week 1): Pt will initiate stair training  Skilled Therapeutic Interventions/Progress Updates: Pt received seated in w/c, denies pain and agreeable to treatment. Reports he is "ready to work hard today" but is concerned about having a bowel movement when he gets up to move. Gait within room approx 30' with RW and min guard; fatigued at end of trial and requiring seated rest break. Pt reports he wants to try warming up on the bike. W/c propulsion x50' with S before UEs fatigued. Stand pivot transfer w/c <>nustep min guard with RW first trial, no AD second transfer. Performed nustep BUE/BLE x4 min level 2 with average 80 steps/min. Gait x50', x30', x60', x90' with RW and minA. Pt able to recognize when fatiguing and request seated rest break. Pt much more motivated with increased gait speed and gait distance when listening to music. Gait into bathroom with RW and min guard x15'. Therapist performed clothing management and hygiene totalA after pt attempted to pull down pants and became frustrated with decreased sensation and fine motor control. Remained seated in w/c at end of session, all needs in reach.      Therapy Documentation Precautions:  Precautions Precautions: Fall Restrictions Weight Bearing Restrictions: No  See Function Navigator for Current Functional Status.   Therapy/Group: Individual Therapy  Luberta Mutter 10/29/2016, 3:44 PM

## 2016-10-29 NOTE — Progress Notes (Signed)
Occupational Therapy Session Note  Patient Details  Name: Joe [name] MRN: [id number] Date of Birth: 20-Jan-1946  Today's Date: 10/29/2016 OT Individual Time: 1100-1200 OT Individual Time Calculation (min): 60 min    Short Term Goals: Week 1:  OT Short Term Goal 1 (Week 1): Pt will be able to complete stand pivot to toilet with min A. OT Short Term Goal 2 (Week 1): Pt will complete toileting tasks with min A to support balance. OT Short Term Goal 3 (Week 1): Pt will be able to groom with min A demonstrating improved FMC. OT Short Term Goal 4 (Week 1): Pt will be independent with [facility] HEP to use in his room.  Skilled Therapeutic Interventions/Progress Updates:    Pt seated in w/c upon arrival with no complaints of pain and agreeable to tx. Pt complains of lack of fingers in hands and takes time to redirect to focus on therapy. Pt requests to work on grip strength and fine motor control and endurance. Pt stand pivot transfer w/c<>EOM and w/c EOB with MIN A for balance and VC for safety awareness. Pt completes 6 standing trials moving resistive clips from shoulder height to waist height for ~64min per standing trial with seated rest breaks. Pt requires VC to slow down movement of hand to focus on quality of motor control versus speed. Pt motivated to stand longer with tasks to keep attention/distract. Pt requests to return to room to change brief d/t Bm. In bed, Pt rolls R<>L with supervision and TOTAL A for posterior hygiene/clothing management. Exited session with pt seated in w/c with call light in reach and activities of session written on schedule per pt request.   Therapy Documentation Precautions:  Precautions Precautions: Fall Restrictions Weight Bearing Restrictions: No Pain: Pain Assessment Pain Assessment: No/denies pain Pain Score: 0-No pain  See Function Navigator for Current Functional Status.   Therapy/Group: Individual Therapy  Tonny Branch 10/29/2016, 12:16  PM
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
## 2016-10-29 NOTE — Progress Notes (Signed)
Nutrition Follow-up  DOCUMENTATION CODES:   Obesity unspecified  INTERVENTION:  Continue 30 ml Prostat po TID, each supplement provides 100 kcal and 15 grams of protein.   Continue Magic cup TID  Between meals, each supplement provides 290 kcal and 9 grams of protein.  Provide nourishment snacks between meals (puddings/yogurt).  Encourage adequate PO intake.   NUTRITION DIAGNOSIS:   Increased nutrient needs related to  (therapy) as evidenced by estimated needs; ongoing  GOAL:   Patient will meet greater than or equal to 90% of their needs; progressing  MONITOR:   PO intake, Supplement acceptance, Diet advancement, Labs, Weight trends, Skin, I & O's  REASON FOR ASSESSMENT:   Consult Calorie Count  ASSESSMENT:   71 y.o. right handed male with history of tremor with vertigo followed by Dr. Sabra Heck, CAD status post CABG as well as recent anterior cervical decompression C3-C4 discectomy and fusion 10/09/2016  Presented 10/17/2016 with increasing weakness and difficulty swallowing. MRI C-spine reviewed, showing fluid collection around spinal cord. A nasogastric tube was placed for nutritional supportThat patient pulled out and also recently started on diet of dysphagia 1 pudding-thick liquids.  Pt reports appetite is fine however dislikes most of his food at meals. Meal completion has been varied from 25-100% with 50-75% intake today. RD to continue with Prostat and Magic cup to aid in caloric and protein needs. Pt additionally reports favoring puddings. RD to order nourishment snacks between meals.   Diet Order:  DIET - DYS 1 Room service appropriate? Yes; Fluid consistency: Pudding Thick  Skin:   (Incision on neck)  Last BM:  3/8  Height:   Ht Readings from Last 1 Encounters:  10/17/16 5\' 9"  (1.753 m)    Weight:   Wt Readings from Last 1 Encounters:  10/28/16 248 lb 14.4 oz (112.9 kg)    Ideal Body Weight:  72.7 kg  BMI:  Body mass index is 36.76  kg/m.  Estimated Nutritional Needs:   Kcal:  2100-2300  Protein:  110-130 grams  Fluid:  2.1 - 2.3 L/day  EDUCATION NEEDS:   No education needs identified at this time  Corrin Parker, MS, RD, LDN Pager # [phone] After hours/ weekend pager # [phone]

## 2016-10-29 NOTE — Progress Notes (Signed)
Speech Language Pathology Weekly Progress and Session Note  Patient Details  Name: Joe [name] MRN: [id number] Date of Birth: 1946-03-16  Beginning of progress report period: October 23, 2016 End of progress report period: October 29, 2016  Today's Date: 10/29/2016 SLP Individual Time: 1530-1600 SLP Individual Time Calculation (min): 30 min  Short Term Goals: Week 1: SLP Short Term Goal 1 (Week 1): Given supervision cues pt will use compensatory swallow strategies when consuming current diet to reduce aspiration risk.  SLP Short Term Goal 1 - Progress (Week 1): Not met SLP Short Term Goal 2 (Week 1): Pt will consume trials of ice chips and small sips of thin liquids without overt s/s of aspiration in preparation for further instrumental swallow study.  SLP Short Term Goal 2 - Progress (Week 1): Not met    New Short Term Goals: Week 2: SLP Short Term Goal 1 (Week 2): Given supervision cues pt will use compensatory swallow strategies when consuming current diet to reduce aspiration risk.  SLP Short Term Goal 2 (Week 2): Pt will consume trials of ice chips and small sips of thin liquids without overt s/s of aspiration in preparation for further instrumental swallow study.   Weekly Progress Updates: Pt has made minimal gains this reporting period d/t continued pharyngeal edema that is impacting his ability to swallow less restrictive consistencies. Pt with recent Modified Barium Swallow Study when continue to demonstrate high aspiration risk. Pt continues to require skilled ST services to increase swallow function prior to discharge.      Intensity: Minumum of 1-2 x/day, 30 to 90 minutes Frequency: 3 to 5 out of 7 days Duration/Length of Stay: 11/11/16 Treatment/Interventions: Dysphagia/aspiration precaution training;Therapeutic Exercise;Patient/family education;Oral motor exercises   Daily Session  Skilled Therapeutic Interventions: Skilled treatment session focused on dysphagia goals.  SLP facilitated session by skilled observation of consumption of ice chips. Pt required Min A verbal cues when consuming ice chips for use of compensatory swallow strategies (not talk while eating). Pt continues with overt s/s of aspiration when consuming. Pt was left upright in wheelchair with all needs within reach. Continue current plan of care.       Function:   Eating Eating   Modified Consistency Diet: Yes Eating Assist Level: Set up assist for;Supervision or verbal cues   Eating Set Up Assist For: Opening containers;Cutting food       Cognition Comprehension Comprehension assist level: Understands complex 90% of the time/cues 10% of the time  Expression   Expression assist level: Expresses complex 90% of the time/cues < 10% of the time  Social Interaction Social Interaction assist level: Interacts appropriately 90% of the time - Needs monitoring or encouragement for participation or interaction.  Problem Solving Problem solving assist level: Solves complex 90% of the time/cues < 10% of the time  Memory Memory assist level: Recognizes or recalls 90% of the time/requires cueing < 10% of the time   General    Pain    Therapy/Group: Individual Therapy   Dannon Perlow B. Rutherford Nail, M.S., CCC-SLP Speech-Language Pathologist   Dann Galicia 10/29/2016, 4:09 PM

## 2016-10-29 NOTE — Plan of Care (Signed)
Problem: SCI BOWEL ELIMINATION Goal: RH STG MANAGE BOWEL WITH ASSISTANCE STG Manage Bowel with min Assistance.   Outcome: Not Progressing Total assist- soap suds enema

## 2016-10-29 NOTE — Progress Notes (Signed)
Social Work Patient ID: Joe [name], male   DOB: 1946-07-04, 71 y.o.   MRN: [id number]   Have reviewed team conference information with pt and daughter, Joe [name].  Both aware and agreeable with targeted d/c date of 3/21 and supervision goals overall.  Pt feels he worked "really hard" today and points to notes left by therapists about how well the sessions went.  Daughter asking if any home modifications need to be done.  I will follow up with txs and let her know.  Continue to follow.  Neiko Trivedi, LCSW

## 2016-10-30 ENCOUNTER — Inpatient Hospital Stay (HOSPITAL_COMMUNITY): Payer: Medicare Other | Admitting: Speech Pathology

## 2016-10-30 ENCOUNTER — Inpatient Hospital Stay (HOSPITAL_COMMUNITY): Payer: Medicare Other

## 2016-10-30 ENCOUNTER — Inpatient Hospital Stay (HOSPITAL_COMMUNITY): Payer: Medicare Other | Admitting: Occupational Therapy

## 2016-10-30 ENCOUNTER — Inpatient Hospital Stay (HOSPITAL_COMMUNITY): Payer: Medicare Other | Admitting: Physical Therapy

## 2016-10-30 LAB — CBC
HEMATOCRIT: 40.1 % (ref 39.0–52.0)
Hemoglobin: 13.8 g/dL (ref 13.0–17.0)
MCH: 31.4 pg (ref 26.0–34.0)
MCHC: 34.4 g/dL (ref 30.0–36.0)
MCV: 91.1 fL (ref 78.0–100.0)
PLATELETS: 234 10*3/uL (ref 150–400)
RBC: 4.4 MIL/uL (ref 4.22–5.81)
RDW: 12.9 % (ref 11.5–15.5)
WBC: 11.2 10*3/uL — AB (ref 4.0–10.5)

## 2016-10-30 LAB — BASIC METABOLIC PANEL
ANION GAP: 6 (ref 5–15)
BUN: 16 mg/dL (ref 6–20)
CO2: 26 mmol/L (ref 22–32)
Calcium: 8.1 mg/dL — ABNORMAL LOW (ref 8.9–10.3)
Chloride: 98 mmol/L — ABNORMAL LOW (ref 101–111)
Creatinine, Ser: 0.78 mg/dL (ref 0.61–1.24)
GFR calc Af Amer: >60 mL/min (ref >60)
GLUCOSE: 106 mg/dL — AB (ref 65–99)
POTASSIUM: 4 mmol/L (ref 3.5–5.1)
Sodium: 130 mmol/L — ABNORMAL LOW (ref 135–145)

## 2016-10-30 NOTE — Progress Notes (Signed)
Occupational Therapy Weekly Progress Note  Patient Details  Name: QUINCEY QUESINBERRY MRN: [id number] Date of Birth: [birth year]  Beginning of progress report period: October 23, 2016 End of progress report period: October 30, 2016  Today's Date: 10/30/2016 OT Individual Time: 6619-6940 and 1300-1400 OT Individual Time Calculation (min): 60 min and 60 min   Patient has met 3 of 4 short term goals.  Pt requiring max-total A for toileting tasks due to decreased standing endurance and impaired UE use. Pt making slow but steady progress towards OT goals. He is most limited by decreased sensation in B UEs, incontinent BMs throughout day, and very low frustration tolerance. Pt's participation level has improved towards end of reporting period, however, pt has required increased encouragement for participation and has refused therapy sessions due to fatigue. Pt making progress towards OT goals and will cont to benefit from OT services to increaese independence with ADL tasks.   Patient continues to demonstrate the following deficits: muscle weakness and muscle paralysis, decreased cardiorespiratoy endurance, and decreased standing balance, decreased postural control and decreased balance strategies and therefore will continue to benefit from skilled OT intervention to enhance overall performance with BADL and Reduce care partner burden.  Patient progressing toward long term goals..  Continue plan of care.  OT Short Term Goals Week 1:  OT Short Term Goal 1 (Week 1): Pt will be able to complete stand pivot to toilet with min A. OT Short Term Goal 1 - Progress (Week 1): Met OT Short Term Goal 2 (Week 1): Pt will complete toileting tasks with min A to support balance. OT Short Term Goal 2 - Progress (Week 1): Not met OT Short Term Goal 3 (Week 1): Pt will be able to groom with min A demonstrating improved FMC. OT Short Term Goal 3 - Progress (Week 1): Met OT Short Term Goal 4 (Week 1): Pt will be independent  with [facility] HEP to use in his room. OT Short Term Goal 4 - Progress (Week 1): Met Week 2:  OT Short Term Goal 1 (Week 2): Pt will complete toileting task with min A OT Short Term Goal 2 (Week 2): Pt will don pants with min A OT Short Term Goal 3 (Week 2): Pt will ambulate to gather clothing with min A in prep for functional task OT Short Term Goal 4 (Week 2): Pt will complete 1 grooming task in standing with close supervision  Skilled Therapeutic Interventions/Progress Updates:    Session One. Pt seen for OT session focusing on ADL re-training and general strengthening and conditioning session. Pt sitting EOB upon arrival set-up with brakfast, pt eating independently despite recommendation for full supervision per SLP recommendations, RN made aware. VCs provided by this therapist for swallowing pre-cautions, however, pt not receptive to cues and becoming agitated with therapist. Pt couching throughout meal, however, appeared able to clear secretions. SLP also made aware of interation Dressing completed EOB, pt able to thread B LEs into pants, encouragement for pt to attempt to pull pants up, however, required assist to pull all the way over hips. Pt cont to display difficulty managing frustrations during ADL tasks. He declined bathing/grooming tasks this morning. He stood from EOB with min A for stand pivot with RW to w/c, VCs required throughout for hand placement on RW and RW management.  Per pt request, completed 6 minutes on NuStep for UE/LE strengthening and general activity tolerance, began on level 2 progressing to level 3. He then completed seated ball toss activity  EOM focusing on UE ROM, pt with impaired shoulder flexion in B UEs. Attempted standing ball toss, however, pt unable to remove B UEs from RW to complete task, requiring at least one UE support. He then compelted standing task placing horseshoes on head height basketball rim, completed with CGA tolerating ~45 seconds of standing before  requiring seated rest break.  Pt completed ambulation trial of 65f with min A. Pt returned to room at end of session via total A in w/c. Left seated in w/c with all needs in reach.  Music played throughout tx session and pt much more engaged and participatory in tx session with upbeat music playing.   Session Two: Pt seen for OT session focusing on UE strengthening/ coordination and LE strengthening. Pt in supine upon arrival, easily awoken and agreeable to tx session. He completed min A hand help assist throughout session with VCs throughout for proper technique.  He self propelled w/c through out unit with B LEs with increased time. Seated EOM, pt completed UE strengthening, x2 sets of 10 bicep curls R/L with VCs for proper form and technique.  Completed hand witting task seated from EOM at table top level. Pt demonstrated decreased control of writing instrument due to slick surface, Koban placed around pen for texture writing surface. With elbow supported on table pt with increased control of pen and thus more legible.  He then completed writing task in standing for UE/ shoulder control and general activity tolerance, tolerating ~45 seconds-1 minute of static standing before requiring seated rest break.  Pt self propelled w/c throughout unit with B LEs for general strengthening and activity tolerance. Pt left with hand off to next OT.   Therapy Documentation Precautions:  Precautions Precautions: Fall Restrictions Weight Bearing Restrictions: No ADL: ADL ADL Comments: refer to functional navigator  See Function Navigator for Current Functional Status.   Therapy/Group: Individual Therapy  Lewis, Malacai Grantz C 10/30/2016, 7:07 AM

## 2016-10-30 NOTE — Plan of Care (Signed)
Problem: SCI BLADDER ELIMINATION Goal: RH STG MANAGE BLADDER WITH ASSISTANCE STG Manage Bladder With Min Assistance   Outcome: Not Progressing Requires in and out cath. Goal: RH STG MANAGE BLADDER WITH EQUIPMENT WITH ASSISTANCE STG Manage Bladder With Equipment With Min  Assistance   Outcome: Not Progressing Max assist don on and off condom catheter.

## 2016-10-30 NOTE — Progress Notes (Signed)
Physical Therapy Weekly Progress Note  Patient Details  Name: Joe [name] MRN: [id number] Date of Birth: Jan 10, 1946  Beginning of progress report period: October 23, 2016 End of progress report period: October 30, 2016  Today's Date: 10/30/2016 PT Individual Time: 0915-1000 PT Individual Time Calculation (min): 45 min   Patient has met 4 of 4 short term goals.  Pt currently requires minA overall for mobility tasks due to LE strength and coordination deficits. Pt also demonstrates limited activity tolerance and is only able to tolerate short bouts of mobility tasks before requiring seated rest break. Therapy has been limited due to bowel incontinence which occurs frequently when standing, position changes, gait. Pt with improving motivation and awareness/understanding of therapy process and goals. Continues to be limited by poor frustration tolerance and perseveration. Pt education ongoing regarding therapy goals. No family present in therapy sessions thus far; will recommend family education session prior to d/c when pt is closer to d/c to review S level.  Patient continues to demonstrate the following deficits muscle weakness, decreased cardiorespiratoy endurance, ataxia and decreased coordination and decreased standing balance, decreased postural control and decreased balance strategies and therefore will continue to benefit from skilled PT intervention to increase functional independence with mobility.  Patient progressing toward long term goals..  Continue plan of care.  PT Short Term Goals Week 1:  PT Short Term Goal 1 (Week 1): Pt will perform bed mobility minA PT Short Term Goal 1 - Progress (Week 1): Met PT Short Term Goal 2 (Week 1): Pt will perform stand pivot transfers minA PT Short Term Goal 2 - Progress (Week 1): Met PT Short Term Goal 3 (Week 1): Pt will ambulate 30' minA with LRAD PT Short Term Goal 3 - Progress (Week 1): Met PT Short Term Goal 4 (Week 1): Pt will initiate stair  training PT Short Term Goal 4 - Progress (Week 1): Met Week 2:  PT Short Term Goal 1 (Week 2): Pt will perform bed mobility modI PT Short Term Goal 2 (Week 2): Pt will perform stand pivot transfer with S PT Short Term Goal 3 (Week 2): Pt will perform gait with RW x100' min guard PT Short Term Goal 4 (Week 2): Pt will perform ascent/descent 4 6-inch stairs with min guard  Skilled Therapeutic Interventions/Progress Updates: Pt received supine in bed with handoff from NT/RN after nursing care; missed 15 min PT. Supine>sit from flat bed with bedrails and S. Gown donned totalA. Stand pivot transfer min guard to w/c. Pt performed ascent/descent four 6-inch stairs with B handrails and min guard; ataxic LE placement and pt reporting difficulty holding onto rails however no slipping noted during trial. Seated in w/c pt performed BUE pipe tree for coordination, fine motor control, and problem solving. Required min/mod cues for problem solving to make pipe tree design match picture demonstration. Pt became very frustrated following pipe tree activity, is verbally agitated and agressive with therapist. Provided pt with therapeutic rest break to decreased frustration level, then discussed again with pt goals of therapy, that activities must be challenging in nature in order to progress pt and improve activity tolerance as well as fine motor control. Pt more calm, apologizes for actions and agreeable to participate. W/c propulsion x50' with BUE for strengthening and endurance. Stand pivot transfer to return to bed with min guard. Remained seated on EOB per pt request with alarm intact and all needs in reach at end of session.      Therapy Documentation Precautions:  Precautions Precautions: Fall Restrictions Weight Bearing Restrictions: No   See Function Navigator for Current Functional Status.  Therapy/Group: Individual Therapy  Luberta Mutter 10/30/2016, 10:21 AM

## 2016-10-30 NOTE — Progress Notes (Signed)
Speech Language Pathology Daily Session Note  Patient Details  Name: Joe [name] MRN: [id number] Date of Birth: [birth year]  Today's Date: 10/30/2016 SLP Individual Time: 1130-1200 SLP Individual Time Calculation (min): 30 min  Short Term Goals: Week 2: SLP Short Term Goal 1 (Week 2): Given supervision cues pt will use compensatory swallow strategies when consuming current diet to reduce aspiration risk.  SLP Short Term Goal 2 (Week 2): Pt will consume trials of ice chips and small sips of thin liquids without overt s/s of aspiration in preparation for further instrumental swallow study.   Skilled Therapeutic Interventions: Skilled treatment session focused on dysphagia goals SLP facilitated session by providing skilled observation of pt consuming ice chips. Pt required Mod A verbal cues for not talking during consumption. Pt with overt s/s of aspiration during 50% of trials. Pt with increased self-monitoring of wet vocal quality with Mod A verbal cues. Pt was left in bed with all needs within reach. Continue per current plan of care.      Function:  Eating Eating   Modified Consistency Diet: Yes Eating Assist Level: Set up assist for;Supervision or verbal cues   Eating Set Up Assist For: Opening containers;Cutting food       Cognition Comprehension Comprehension assist level: Understands complex 90% of the time/cues 10% of the time  Expression   Expression assist level: Expresses complex 90% of the time/cues < 10% of the time  Social Interaction Social Interaction assist level: Interacts appropriately 90% of the time - Needs monitoring or encouragement for participation or interaction.  Problem Solving Problem solving assist level: Solves complex 90% of the time/cues < 10% of the time  Memory Memory assist level: Recognizes or recalls 90% of the time/requires cueing < 10% of the time    Pain Pain Assessment Pain Assessment: 0-10 Pain Score: 0-No pain  Therapy/Group:  Individual Therapy   Ellyssa Zagal B. Rutherford Nail, M.S., CCC-SLP Speech-Language Pathologist   Kinzi Frediani 10/30/2016, 12:18 PM

## 2016-10-30 NOTE — Progress Notes (Signed)
Subjective/Complaints: Feeling better today. Had a good day with therapy yesterday. Less anxiety/able to focus. Never received sitz bath. Had bm yesterday afternoon, feels that bowels are about "cleaned out"  ROS: pt denies nausea, vomiting, diarrhea, cough, shortness of breath or chest pain    Objective: Vital Signs: Blood pressure (!) 147/73, pulse 77, temperature 97.6 F (36.4 C), temperature source Oral, resp. rate 18, weight 112.9 kg (248 lb 14.4 oz), SpO2 97 %. No results found. Results for orders placed or performed during the hospital encounter of 10/22/16 (from the past 72 hour(s))  CBC     Status: Abnormal   Collection Time: 10/30/16  5:38 AM  Result Value Ref Range   WBC 11.2 (H) 4.0 - 10.5 K/uL   RBC 4.40 4.22 - 5.81 MIL/uL   Hemoglobin 13.8 13.0 - 17.0 g/dL   HCT 79.6 41.8 - 93.7 %   MCV 91.1 78.0 - 100.0 fL   MCH 31.4 26.0 - 34.0 pg   MCHC 34.4 30.0 - 36.0 g/dL   RDW 37.4 96.6 - 46.6 %   Platelets 234 150 - 400 K/uL  Basic metabolic panel     Status: Abnormal   Collection Time: 10/30/16  5:38 AM  Result Value Ref Range   Sodium 130 (L) 135 - 145 mmol/L   Potassium 4.0 3.5 - 5.1 mmol/L   Chloride 98 (L) 101 - 111 mmol/L   CO2 26 22 - 32 mmol/L   Glucose, Bld 106 (H) 65 - 99 mg/dL   BUN 16 6 - 20 mg/dL   Creatinine, Ser 0.56 0.61 - 1.24 mg/dL   Calcium 8.1 (L) 8.9 - 10.3 mg/dL   GFR calc non Af Amer >60 >60 mL/min   GFR calc Af Amer >60 >60 mL/min    Comment: (NOTE) The eGFR has been calculated using the CKD EPI equation. This calculation has not been validated in all clinical situations. eGFR's persistently <60 mL/min signify possible Chronic Kidney Disease.    Anion gap 6 5 - 15     HEENT: normocephalic, atraumatic.  Cardio: RRR Resp: CTA B GI: BS+, NT, ND Skin:   Small sacral wound stable Neuro: Alert/Oriented Sensation diminished to light touch b/l hands to shoulders, hands and feet remain most involved. Has difficulty with [facility] Motor 4+/5  bilateral delt , bi, tri , grip, HF, KE, ADF --no change Musc/Skel:    Mild pec tenderness bilaterally Uro: condom cath on Psych: less anxious.  Gen NAD.      Assessment/Plan: 1. 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 Physiatrist is providing close team supervision and 24 hour management of active medical problems listed below. Physiatrist and rehab team continue to assess barriers to discharge/monitor patient progress toward functional and medical goals. FIM: Function - Bathing Position: Shower Body parts bathed by patient: Right arm, Left arm, Chest, Abdomen, Front perineal area, Right upper leg, Left upper leg, Back Body parts bathed by helper: Buttocks, Right lower leg, Left lower leg Assist Level: Touching or steadying assistance(Pt > 75%)  Function- Upper Body Dressing/Undressing What is the patient wearing?: Pull over shirt/dress Pull over shirt/dress - Perfomed by patient: Thread/unthread right sleeve, Thread/unthread left sleeve, Pull shirt over trunk, Put head through opening Pull over shirt/dress - Perfomed by helper: Put head through opening Assist Level: Set up Set up : To obtain clothing/put away Function - Lower Body Dressing/Undressing What is the patient wearing?: Pants, Shoes  Position: Other (comment) (Sitting on toilet) Pants- Performed by patient: Thread/unthread right pants leg, Thread/unthread left pants leg Pants- Performed by helper: Thread/unthread right pants leg, Thread/unthread left pants leg, Pull pants up/down Non-skid slipper socks- Performed by patient: Don/doff right sock, Don/doff left sock (sock aid) Non-skid slipper socks- Performed by helper: Don/doff right sock, Don/doff left sock Shoes - Performed by helper: Don/doff right shoe, Don/doff left shoe Assist for footwear: Maximal assist Assist for lower body dressing: Touching or  steadying assistance (Pt > 75%)  Function - Toileting Toileting activity did not occur: No continent bowel/bladder event Toileting steps completed by helper: Adjust clothing prior to toileting, Performs perineal hygiene, Adjust clothing after toileting Toileting Assistive Devices: Grab bar or rail Assist level: Touching or steadying assistance (Pt.75%)  Function - Air cabin crew transfer activity did not occur: Safety/medical concerns Toilet transfer assistive device: Elevated toilet seat/BSC over toilet, Grab bar, Walker Assist level to toilet: Touching or steadying assistance (Pt > 75%) Assist level from toilet: Touching or steadying assistance (Pt > 75%)  Function - Chair/bed transfer Chair/bed transfer method: Stand pivot Chair/bed transfer assist level: Touching or steadying assistance (Pt > 75%) Chair/bed transfer assistive device: Armrests, Walker Chair/bed transfer details: Verbal cues for precautions/safety, Verbal cues for technique, Tactile cues for placement, Tactile cues for posture  Function - Locomotion: Wheelchair Max wheelchair distance: 100 Assist Level: Supervision or verbal cues Assist Level: Supervision or verbal cues Wheel 150 feet activity did not occur: Safety/medical concerns (fatigue) Turns around,maneuvers to table,bed, and toilet,negotiates 3% grade,maneuvers on rugs and over doorsills: No Function - Locomotion: Ambulation Assistive device: Walker-rolling Max distance: 90 Assist level: Touching or steadying assistance (Pt > 75%) Assist level: Touching or steadying assistance (Pt > 75%) Walk 50 feet with 2 turns activity did not occur: Safety/medical concerns Assist level: Touching or steadying assistance (Pt > 75%) Walk 150 feet activity did not occur: Safety/medical concerns Walk 10 feet on uneven surfaces activity did not occur: Safety/medical concerns  Function - Comprehension Comprehension: Auditory Comprehension assist level: Understands  complex 90% of the time/cues 10% of the time  Function - Expression Expression: Verbal Expression assist level: Expresses complex 90% of the time/cues < 10% of the time  Function - Social Interaction Social Interaction assist level: Interacts appropriately 90% of the time - Needs monitoring or encouragement for participation or interaction.  Function - Problem Solving Problem solving assist level: Solves complex 90% of the time/cues < 10% of the time  Function - Memory Memory assist level: Recognizes or recalls 90% of the time/requires cueing < 10% of the time Patient normally able to recall (first 3 days only): Current season, Location of own room, Staff names and faces, That he or she is in a hospital  Medical Problem List and Plan: 1. 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 DVT Prophylaxis/Anticoagulation: SCDs.   Vascular study neg for DVT. 3. Pain Management: Neurontin  increased to '200mg'$  TID---may help with mood also.    -consider further titration next week  -hydrocodone as needed  -fair control 4. Mood: Provide emotional support  -pt needs constant reminding and positive reinforcement regarding his rehab and recovery  -weaning off decadron should help mood also 5. Neuropsych: This patient iscapable of making decisions on hisown behalf. 6. Skin/Wound Care: Routine skin checks 7. Fluids/Electrolytes/Nutrition: Routine I&Os , BUN/Creat wnl today  -encouraging po  -I personally reviewed the patient's labs today.  8.Dysphagia. Dysphagia #1 pudding thick liquids. Follow-up speech therapy. continue IV fluids daily at bedtime 9.CAD status post CABG. No chest pain or shortness of breath. Discusswith neurosurgery onresuming aspirin 81 mg daily 10.Hypertension. Cozaar 100 mg daily, Lasix 20 mg twice a day. Monitor with increased mobility Vitals:   10/29/16 1411 10/30/16  0700  BP: (!) 122/58 (!) 147/73  Pulse: 85 77  Resp: 20 18  Temp: 98.2 F (36.8 C) 97.6 F (36.4 C)   Control improved 11.MRSA PCR screening positive. Contact precautions 12.Sinusitis.Unasyn changed to augmentin (duration?)---see no further signs, afebrile  -off abx  -wbc's 11.2 today 12.Hyponatremia.  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 Hemorrhoids  Tucks ordered 3/4  - TID sitz bath (not given yesterday)  .   LOS (Days) 8 A FACE TO FACE EVALUATION WAS PERFORMED  Chaunice Obie T 10/30/2016, 8:45 AM

## 2016-10-30 NOTE — Progress Notes (Signed)
Occupational Therapy Session Note  Patient Details  Name: Joe [name] MRN: [id number] Date of Birth: [birth year]  Today's Date: 10/30/2016 OT Individual Time: 2355-7322 OT Individual Time Calculation (min): 28 min    Short Term Goals: Week 2:  OT Short Term Goal 1 (Week 2): Pt will complete toileting task with min A OT Short Term Goal 2 (Week 2): Pt will don pants with min A OT Short Term Goal 3 (Week 2): Pt will ambulate to gather clothing with min A in prep for functional task OT Short Term Goal 4 (Week 2): Pt will complete 1 grooming task in standing with close supervision  Skilled Therapeutic Interventions/Progress Updates:    Direct hand off from OT and pushed pt back to room. Pt stands to brush teeth/mouthwash in prep for ice chip protocol. Pt able to open containers but demo difficulty coordinating movements and drops toothpaste onto floor. In between standing activities, taking spoonful of ice chips with intermittent throat clearing per SLP protocol. Pt received VC to not talk while trying to swallow. Pt sit to stand with contact guard assist and VC for safety awareness. Pt practices stepping forward/backwad using RW with contact guard assist. Pt stand pivot transfer with MIN A for balance and VC for safety awareness w/c to bed with Rw. Exited session with pt EOB, all needs met and bed exit alarm on.   Therapy Documentation Precautions:  Precautions Precautions: Fall Restrictions Weight Bearing Restrictions: No ADL: ADL ADL Comments: refer to functional navigator  See Function Navigator for Current Functional Status.   Therapy/Group: Individual Therapy  Tonny Branch 10/30/2016, 5:54 PM

## 2016-10-31 MED ORDER — POLYETHYLENE GLYCOL 3350 17 G PO PACK: 17.0000 g | PACK | Freq: Every day | ORAL | Status: DC | PRN

## 2016-10-31 NOTE — Progress Notes (Signed)
Patient refused his dulcolax suppository stating that he does not need it. He claimed he has been having multiple bowel movements for the past 2 days. Education provided.

## 2016-10-31 NOTE — Progress Notes (Signed)
Subjective/Complaints: Legs jumping last noc Pt states he's had 7-9 stool the last few days ROS: pt denies nausea, vomiting, diarrhea, cough, shortness of breath or chest pain    Objective: Vital Signs: Blood pressure 124/69, pulse 60, temperature 97.9 F (36.6 C), temperature source Oral, resp. rate 18, weight 112.9 kg (248 lb 14.4 oz), SpO2 100 %. No results found. Results for orders placed or performed during the hospital encounter of 10/22/16 (from the past 72 hour(s))  CBC     Status: Abnormal   Collection Time: 10/30/16  5:38 AM  Result Value Ref Range   WBC 11.2 (H) 4.0 - 10.5 K/uL   RBC 4.40 4.22 - 5.81 MIL/uL   Hemoglobin 13.8 13.0 - 17.0 g/dL   HCT 40.1 39.0 - 52.0 %   MCV 91.1 78.0 - 100.0 fL   MCH 31.4 26.0 - 34.0 pg   MCHC 34.4 30.0 - 36.0 g/dL   RDW 12.9 11.5 - 15.5 %   Platelets 234 150 - 400 K/uL  Basic metabolic panel     Status: Abnormal   Collection Time: 10/30/16  5:38 AM  Result Value Ref Range   Sodium 130 (L) 135 - 145 mmol/L   Potassium 4.0 3.5 - 5.1 mmol/L   Chloride 98 (L) 101 - 111 mmol/L   CO2 26 22 - 32 mmol/L   Glucose, Bld 106 (H) 65 - 99 mg/dL   BUN 16 6 - 20 mg/dL   Creatinine, Ser 0.78 0.61 - 1.24 mg/dL   Calcium 8.1 (L) 8.9 - 10.3 mg/dL   GFR calc non Af Amer >60 >60 mL/min   GFR calc Af Amer >60 >60 mL/min    Comment: (NOTE) The eGFR has been calculated using the CKD EPI equation. This calculation has not been validated in all clinical situations. eGFR's persistently <60 mL/min signify possible Chronic Kidney Disease.    Anion gap 6 5 - 15     HEENT: normocephalic, atraumatic.  Cardio: RRR Resp: CTA B GI: BS+, NT, ND Skin:  No skin breakdown on heels Neuro: Alert/Oriented Sensation intact  light touch b/l hands to shoulders, hands and feet remain most involved. Has difficulty with [facility] Motor 4+/5 bilateral delt , bi, tri , grip, HF, KE, ADF --no change Musc/Skel:    Mild pec tenderness bilaterally Uro: condom cath on Psych:  less anxious.  Gen NAD.      Assessment/Plan: 1. 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 Physiatrist is providing close team supervision and 24 hour management of active medical problems listed below. Physiatrist and rehab team continue to assess barriers to discharge/monitor patient progress toward functional and medical goals. FIM: Function - Bathing Position: Shower Body parts bathed by patient: Right arm, Left arm, Chest, Abdomen, Front perineal area, Right upper leg, Left upper leg, Back, Buttocks, Right lower leg, Left lower leg Body parts bathed by helper: Buttocks, Right lower leg, Left lower leg Assist Level: Touching or steadying assistance(Pt > 75%)  Function- Upper Body Dressing/Undressing What is the patient wearing?: Pull over shirt/dress Pull over shirt/dress - Perfomed by patient: Thread/unthread right sleeve, Thread/unthread left sleeve, Pull shirt over trunk, Put head through opening Pull over shirt/dress - Perfomed by helper: Put head through opening Assist Level: Set up Set up : To obtain clothing/put away Function - Lower Body Dressing/Undressing What is the patient wearing?: Pants, Shoes Position: Sitting EOB Pants- Performed by patient: Thread/unthread right pants  leg, Thread/unthread left pants leg, Pull pants up/down Pants- Performed by helper: Pull pants up/down Non-skid slipper socks- Performed by patient: Don/doff right sock, Don/doff left sock (sock aid) Non-skid slipper socks- Performed by helper: Don/doff right sock, Don/doff left sock Shoes - Performed by patient: Don/doff right shoe, Don/doff left shoe Shoes - Performed by helper: Don/doff right shoe, Don/doff left shoe Assist for footwear: Supervision/touching assist Assist for lower body dressing: Touching or steadying assistance (Pt > 75%)  Function - Toileting Toileting activity  did not occur: No continent bowel/bladder event Toileting steps completed by helper: Adjust clothing prior to toileting, Performs perineal hygiene, Adjust clothing after toileting Toileting Assistive Devices: Grab bar or rail Assist level: Touching or steadying assistance (Pt.75%)  Function - Air cabin crew transfer activity did not occur: Safety/medical concerns Toilet transfer assistive device: Elevated toilet seat/BSC over toilet, Grab bar, Walker Assist level to toilet: Touching or steadying assistance (Pt > 75%) Assist level from toilet: Touching or steadying assistance (Pt > 75%)  Function - Chair/bed transfer Chair/bed transfer method: Stand pivot Chair/bed transfer assist level: Touching or steadying assistance (Pt > 75%) Chair/bed transfer assistive device: Armrests Chair/bed transfer details: Verbal cues for precautions/safety, Verbal cues for technique, Tactile cues for placement, Tactile cues for posture  Function - Locomotion: Wheelchair Max wheelchair distance: 100 Assist Level: Supervision or verbal cues Assist Level: Supervision or verbal cues Wheel 150 feet activity did not occur: Safety/medical concerns (fatigue) Turns around,maneuvers to table,bed, and toilet,negotiates 3% grade,maneuvers on rugs and over doorsills: No Function - Locomotion: Ambulation Assistive device: Walker-rolling Max distance: 90 Assist level: Touching or steadying assistance (Pt > 75%) Assist level: Touching or steadying assistance (Pt > 75%) Walk 50 feet with 2 turns activity did not occur: Safety/medical concerns Assist level: Touching or steadying assistance (Pt > 75%) Walk 150 feet activity did not occur: Safety/medical concerns Walk 10 feet on uneven surfaces activity did not occur: Safety/medical concerns  Function - Comprehension Comprehension: Auditory Comprehension assist level: Understands complex 90% of the time/cues 10% of the time  Function - Expression Expression:  Verbal Expression assist level: Expresses complex 90% of the time/cues < 10% of the time  Function - Social Interaction Social Interaction assist level: Interacts appropriately 90% of the time - Needs monitoring or encouragement for participation or interaction.  Function - Problem Solving Problem solving assist level: Solves complex 90% of the time/cues < 10% of the time  Function - Memory Memory assist level: Recognizes or recalls 90% of the time/requires cueing < 10% of the time Patient normally able to recall (first 3 days only): Current season, Location of own room, Staff names and faces, That he or she is in a hospital  Medical Problem List and Plan: 1. 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 DVT Prophylaxis/Anticoagulation: SCDs.   Vascular study neg for DVT. 3. Pain Management: Neurontin  increased to 224m TID---may help with mood also.    -consider further titration next week  -hydrocodone as needed  -fair control 4. Mood: Provide emotional support  -pt needs constant reminding and positive reinforcement regarding his rehab and recovery  -weaning off decadron should help mood also 5. Neuropsych: This patient iscapable of making decisions on hisown behalf. 6. Skin/Wound Care: Routine skin checks 7. Fluids/Electrolytes/Nutrition: Routine I&Os , BUN/Creat 3/9   8.Dysphagia. Dysphagia #1 pudding thick liquids. Follow-up speech therapy. continue IV fluids daily at bedtime 9.CAD status post  CABG. No chest pain or shortness of breath. Discusswith neurosurgery onresuming aspirin 81 mg daily 10.Hypertension. Cozaar 100 mg daily, Lasix 20 mg twice a day. Monitor with increased mobility Vitals:   10/30/16 1437 10/31/16 0438  BP: (!) 113/55 124/69  Pulse: 75 60  Resp: 20 18  Temp: 97.9 F (36.6 C) 97.9 F (36.6 C)   Control improved 11.MRSA PCR screening  positive. Contact precautions 12.Sinusitis.Unasyn changed to augmentin (duration?)---see no further signs, afebrile  -off abx  -wbc's 11.2 today 12.Hyponatremia.  -likely related to diuretics  - Na+ 130 today (sl decreased)---recheck monday 13.Neurogenic bladder: begin timed voids  -has poor awareness of bladder emptying 14.neurogenic bowel: emptying bowels better  -now with excessive BM  -miralax change to daily prn      15. Hemorrhoids  Tucks ordered 3/4  - TID sitz bath (not given yesterday)  .   LOS (Days) 9 A FACE TO FACE EVALUATION WAS PERFORMED  Seraphim Affinito E 10/31/2016, 7:28 AM

## 2016-11-01 ENCOUNTER — Inpatient Hospital Stay (HOSPITAL_COMMUNITY): Payer: Medicare Other | Admitting: Occupational Therapy

## 2016-11-01 ENCOUNTER — Inpatient Hospital Stay (HOSPITAL_COMMUNITY): Payer: Medicare Other | Admitting: Physical Therapy

## 2016-11-01 MED ORDER — ALUM & MAG HYDROXIDE-SIMETH 200-200-20 MG/5ML PO SUSP
30.0000 mL | Freq: Four times a day (QID) | ORAL | Status: DC | PRN
  Administered 2016-11-01: 30 mL via ORAL
  Filled 2016-11-01: qty 30

## 2016-11-01 NOTE — Progress Notes (Signed)
Physical Therapy Session Note  Patient Details  Name: Joe [name] MRN: [id number] Date of Birth: 31-Jan-1946  Today's Date: 11/01/2016 PT Individual Time: 1105-1130 PT Individual Time Calculation (min): 25 min   Short Term Goals: Week 2:  PT Short Term Goal 1 (Week 2): Pt will perform bed mobility modI PT Short Term Goal 2 (Week 2): Pt will perform stand pivot transfer with S PT Short Term Goal 3 (Week 2): Pt will perform gait with RW x100' min guard PT Short Term Goal 4 (Week 2): Pt will perform ascent/descent 4 6-inch stairs with min guard  Skilled Therapeutic Interventions/Progress Updates:   Patient in wheelchair upon arrival. Session focused on wheelchair propulsion using BUE x 30 ft with supervision, stand pivot and sit <> stand transfers using RW with min A, stair training up/down 4 (6") stairs using 2 rails x 2 with step-to pattern and min A and seated rest between trials, and gait training using RW x 45 ft with min A. Patient limited by c/o back and hemorrhoid pain and feet "feeling like sponges." In room, patient ambulated to bed with min A and left sitting EOB with all needs in reach to await lunch.   Therapy Documentation Precautions:  Precautions Precautions: Fall Restrictions Weight Bearing Restrictions: No Pain: Pain Assessment Pain Assessment: 0-10 Pain Score: 4  Pain Type: Acute pain Pain Location: Back Pain Orientation: Lower Pain Descriptors / Indicators: Aching Pain Onset: On-going Pain Intervention(s): Repositioned;Rest  See Function Navigator for Current Functional Status.   Therapy/Group: Individual Therapy  Terran Klinke, Murray Hodgkins 11/01/2016, 11:28 AM

## 2016-11-01 NOTE — Progress Notes (Signed)
Subjective/Complaints: Per RN refusing dulcolax supp, pt c/o freq stools, hx of hemmorhoids Still req straight cath ROS: pt denies nausea, vomiting, diarrhea, cough, shortness of breath or chest pain    Objective: Vital Signs: Blood pressure (!) 125/54, pulse 61, temperature 98 F (36.7 C), temperature source Oral, resp. rate 18, weight 112.9 kg (248 lb 14.4 oz), SpO2 96 %. No results found. Results for orders placed or performed during the hospital encounter of 10/22/16 (from the past 72 hour(s))  CBC     Status: Abnormal   Collection Time: 10/30/16  5:38 AM  Result Value Ref Range   WBC 11.2 (H) 4.0 - 10.5 K/uL   RBC 4.40 4.22 - 5.81 MIL/uL   Hemoglobin 13.8 13.0 - 17.0 g/dL   HCT 40.1 39.0 - 52.0 %   MCV 91.1 78.0 - 100.0 fL   MCH 31.4 26.0 - 34.0 pg   MCHC 34.4 30.0 - 36.0 g/dL   RDW 12.9 11.5 - 15.5 %   Platelets 234 150 - 400 K/uL  Basic metabolic panel     Status: Abnormal   Collection Time: 10/30/16  5:38 AM  Result Value Ref Range   Sodium 130 (L) 135 - 145 mmol/L   Potassium 4.0 3.5 - 5.1 mmol/L   Chloride 98 (L) 101 - 111 mmol/L   CO2 26 22 - 32 mmol/L   Glucose, Bld 106 (H) 65 - 99 mg/dL   BUN 16 6 - 20 mg/dL   Creatinine, Ser 0.78 0.61 - 1.24 mg/dL   Calcium 8.1 (L) 8.9 - 10.3 mg/dL   GFR calc non Af Amer >60 >60 mL/min   GFR calc Af Amer >60 >60 mL/min    Comment: (NOTE) The eGFR has been calculated using the CKD EPI equation. This calculation has not been validated in all clinical situations. eGFR's persistently <60 mL/min signify possible Chronic Kidney Disease.    Anion gap 6 5 - 15     HEENT: normocephalic, atraumatic.  Cardio: RRR Resp: CTA B GI: BS+, NT, ND Skin:  No skin breakdown on heels Neuro: Alert/Oriented Sensation intact  light touch b/l hands to shoulders, hands and feet remain most involved. Has difficulty with [facility] Motor 4+/5 bilateral delt , bi, tri , grip, HF, KE, ADF --no change Musc/Skel:    Mild pec tenderness  bilaterally Uro: condom cath on Psych: less anxious.  Gen NAD.      Assessment/Plan: 1. 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 Physiatrist is providing close team supervision and 24 hour management of active medical problems listed below. Physiatrist and rehab team continue to assess barriers to discharge/monitor patient progress toward functional and medical goals. FIM: Function - Bathing Position: Shower Body parts bathed by patient: Right arm, Left arm, Chest, Abdomen, Front perineal area, Right upper leg, Left upper leg, Back, Buttocks, Right lower leg, Left lower leg Body parts bathed by helper: Buttocks, Right lower leg, Left lower leg Assist Level: Touching or steadying assistance(Pt > 75%)  Function- Upper Body Dressing/Undressing What is the patient wearing?: Pull over shirt/dress Pull over shirt/dress - Perfomed by patient: Thread/unthread right sleeve, Thread/unthread left sleeve, Pull shirt over trunk, Put head through opening Pull over shirt/dress - Perfomed by helper: Put head through opening Assist Level: Set up Set up : To obtain clothing/put away Function - Lower Body Dressing/Undressing What is the patient wearing?: Pants, Shoes Position: Sitting EOB Pants- Performed by patient:  Thread/unthread right pants leg, Thread/unthread left pants leg, Pull pants up/down Pants- Performed by helper: Pull pants up/down Non-skid slipper socks- Performed by patient: Don/doff right sock, Don/doff left sock (sock aid) Non-skid slipper socks- Performed by helper: Don/doff right sock, Don/doff left sock Shoes - Performed by patient: Don/doff right shoe, Don/doff left shoe Shoes - Performed by helper: Don/doff right shoe, Don/doff left shoe Assist for footwear: Supervision/touching assist Assist for lower body dressing: Touching or steadying assistance (Pt >  75%)  Function - Toileting Toileting activity did not occur: No continent bowel/bladder event Toileting steps completed by helper: Adjust clothing prior to toileting, Performs perineal hygiene, Adjust clothing after toileting Toileting Assistive Devices: Grab bar or rail Assist level: Touching or steadying assistance (Pt.75%)  Function - Air cabin crew transfer activity did not occur: Safety/medical concerns Toilet transfer assistive device: Elevated toilet seat/BSC over toilet, Grab bar, Walker Assist level to toilet: Touching or steadying assistance (Pt > 75%) Assist level from toilet: Touching or steadying assistance (Pt > 75%)  Function - Chair/bed transfer Chair/bed transfer method: Stand pivot Chair/bed transfer assist level: Touching or steadying assistance (Pt > 75%) Chair/bed transfer assistive device: Armrests Chair/bed transfer details: Verbal cues for precautions/safety, Verbal cues for technique, Tactile cues for placement, Tactile cues for posture  Function - Locomotion: Wheelchair Max wheelchair distance: 100 Assist Level: Supervision or verbal cues Assist Level: Supervision or verbal cues Wheel 150 feet activity did not occur: Safety/medical concerns (fatigue) Turns around,maneuvers to table,bed, and toilet,negotiates 3% grade,maneuvers on rugs and over doorsills: No Function - Locomotion: Ambulation Assistive device: Walker-rolling Max distance: 90 Assist level: Touching or steadying assistance (Pt > 75%) Assist level: Touching or steadying assistance (Pt > 75%) Walk 50 feet with 2 turns activity did not occur: Safety/medical concerns Assist level: Touching or steadying assistance (Pt > 75%) Walk 150 feet activity did not occur: Safety/medical concerns Walk 10 feet on uneven surfaces activity did not occur: Safety/medical concerns  Function - Comprehension Comprehension: Auditory Comprehension assist level: Follows basic conversation/direction with no  assist  Function - Expression Expression: Verbal Expression assist level: Expresses basic needs/ideas: With no assist  Function - Social Interaction Social Interaction assist level: Interacts appropriately 75 - 89% of the time - Needs redirection for appropriate language or to initiate interaction.  Function - Problem Solving Problem solving assist level: Solves basic problems with no assist  Function - Memory Memory assist level: Recognizes or recalls 75 - 89% of the time/requires cueing 10 - 24% of the time Patient normally able to recall (first 3 days only): Current season, Location of own room, Staff names and faces, That he or she is in a hospital  Medical Problem List and Plan: 1. 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 DVT Prophylaxis/Anticoagulation: SCDs.   Vascular study neg for DVT. 3. Pain Management: Neurontin  increased to 219m TID---may help with mood also.    -consider further titration next week  -hydrocodone as needed  -fair control 4. Mood: Provide emotional support  -pt needs constant reminding and positive reinforcement regarding his rehab and recovery  -weaning off decadron should help mood also 5. Neuropsych: This patient iscapable of making decisions on hisown behalf. 6. Skin/Wound Care: Routine skin checks 7. Fluids/Electrolytes/Nutrition: Routine I&Os , BMET 3/12   8.Dysphagia. Dysphagia #1 pudding thick liquids. Follow-up speech therapy. continue IV fluids daily at bedtime, pt hoping for repeat swallow study soon 9.CAD status post  CABG. No chest pain or shortness of breath. Discusswith neurosurgery onresuming aspirin 81 mg daily 10.Hypertension. Cozaar 100 mg daily, Lasix 20 mg twice a day. Monitor with increased mobility Vitals:   10/31/16 1544 11/01/16 0421  BP: (!) 121/57 (!) 125/54  Pulse: 66 61  Resp: 18 18  Temp: 97.9 F (36.6 C) 98 F (36.7 C)   Control  improved 11.MRSA PCR screening positive. Contact precautions 12.Sinusitis.Unasyn changed to augmentin (duration?)---see no further signs, afebrile  -off abx  -wbc's 11.2 today 12.Hyponatremia.  -likely related to diuretics  - Na+ 130 today (sl decreased)---recheck monday 13.Neurogenic bladder: begin timed voids  -2 inc voids and one cath for 51m yesterday 14.neurogenic bowel: emptying bowels better  -now with excessive BM  -miralax change to daily prn      15. Hemorrhoids  Tucks ordered 3/4  - TID sitz bath (not given yesterday)  .   LOS (Days) 10 A FACE TO FACE EVALUATION WAS PERFORMED  Jazmin Vensel E 11/01/2016, 6:54 AM

## 2016-11-01 NOTE — Progress Notes (Signed)
Occupational Therapy Session Note  Patient Details  Name: Joe [name] MRN: [id number] Date of Birth: 27-Feb-1946  Today's Date: 11/01/2016 OT Individual Time: 9163-8466 OT Individual Time Calculation (min): 55 min    Short Term Goals: Week 2:  OT Short Term Goal 1 (Week 2): Pt will complete toileting task with min A OT Short Term Goal 2 (Week 2): Pt will don pants with min A OT Short Term Goal 3 (Week 2): Pt will ambulate to gather clothing with min A in prep for functional task OT Short Term Goal 4 (Week 2): Pt will complete 1 grooming task in standing with close supervision  Skilled Therapeutic Interventions/Progress Updates: 1:1 self care retraining at sink level due to IV still running due to losing an hr of time last night. Pt demonstrated functional ambulation with min A but mod A to safely manage RW to get to the sink. Pt only washed UB due to incontience earlier and already washed up. Pt with low frustration tolerance with trying something new to progress; pt just dictates what he is going to do. CONTINUED TO FOCUS on short distance functional ambulation with RW. Pt requested to perform  Nustep for 5 min total increasing resistance to 4 by the end. Pt propelled self ~50 feet with bilateral feet and then with UEs  ~10 feet before requesting to stop activity.  Pt declined toileting and attempting to void on toilet in session - even though he felt he was going to have a Bm at some point- reported he didn't feel like he wanted to attempt sitting on commode.      Therapy Documentation Precautions:  Precautions Precautions: Fall Restrictions Weight Bearing Restrictions: No   Pain:  no c/o pain but consistently c/o numbness in hands and feet throughout session  ADL: ADL ADL Comments: refer to functional navigator  See Function Navigator for Current Functional Status.   Therapy/Group: Individual Therapy  [name] [facility] 11/01/2016, 8:55 AM

## 2016-11-02 ENCOUNTER — Inpatient Hospital Stay (HOSPITAL_COMMUNITY): Payer: Medicare Other | Admitting: Occupational Therapy

## 2016-11-02 ENCOUNTER — Inpatient Hospital Stay (HOSPITAL_COMMUNITY): Payer: Medicare Other | Admitting: Physical Therapy

## 2016-11-02 ENCOUNTER — Inpatient Hospital Stay (HOSPITAL_COMMUNITY): Payer: Medicare Other | Admitting: Speech Pathology

## 2016-11-02 LAB — BASIC METABOLIC PANEL
Anion gap: 7 (ref 5–15)
BUN: 10 mg/dL (ref 6–20)
CHLORIDE: 97 mmol/L — AB (ref 101–111)
CO2: 27 mmol/L (ref 22–32)
CREATININE: 0.71 mg/dL (ref 0.61–1.24)
Calcium: 8.4 mg/dL — ABNORMAL LOW (ref 8.9–10.3)
GFR calc non Af Amer: >60 mL/min (ref >60)
Glucose, Bld: 121 mg/dL — ABNORMAL HIGH (ref 65–99)
POTASSIUM: 4.2 mmol/L (ref 3.5–5.1)
Sodium: 131 mmol/L — ABNORMAL LOW (ref 135–145)

## 2016-11-02 MED ORDER — SUCRALFATE 1 GM/10ML PO SUSP
1.0000 g | Freq: Three times a day (TID) | ORAL | Status: DC
  Administered 2016-11-02 – 2016-11-11 (×23): 1 g via ORAL
  Filled 2016-11-02 (×30): qty 10

## 2016-11-02 MED ORDER — BLISTEX MEDICATED EX OINT
TOPICAL_OINTMENT | CUTANEOUS | Status: DC | PRN
  Filled 2016-11-02: qty 6.3

## 2016-11-02 NOTE — Progress Notes (Signed)
Physical Therapy Session Note  Patient Details  Name: Joe [name] MRN: [id number] Date of Birth: Jul 03, 1946  Today's Date: 11/02/2016 PT Individual Time: 1100-1200 PT Individual Time Calculation (min): 60 min   Short Term Goals: Week 2:  PT Short Term Goal 1 (Week 2): Pt will perform bed mobility modI PT Short Term Goal 2 (Week 2): Pt will perform stand pivot transfer with S PT Short Term Goal 3 (Week 2): Pt will perform gait with RW x100' min guard PT Short Term Goal 4 (Week 2): Pt will perform ascent/descent 4 6-inch stairs with min guard  Skilled Therapeutic Interventions/Progress Updates: Pt received supine in bed, denies pain and agreeable to treatment. Supine>sit with modI and bed flat. Stand pivot transfer with RW and close S. Gait to gym with min guard x175'; pt motivated by music playing. Stairs ascent/descent 8 steps 6" height with B handrails and min guard. Gait x90' with RW and min guard. Seated LE strengthening exercises with 1# cuff weights long arc quads and hip flexion marching 3x15 reps. Sit <>stand 2x10 reps with UE support on RW. Stand pivot transfer to w/c close S with RW. W/c propulsion x75' with S for strengthening until fatigued. Stand pivot transfer w/c <>BSC over toilet with grab bars and S. Therapist managed clothing and performed hygiene after incontinent bowel movement. Stand pivot transfer w/c >bed with RW and S. Remained seated EOB with alarm intact and all needs in reach.      Therapy Documentation Precautions:  Precautions Precautions: Fall Restrictions Weight Bearing Restrictions: No   See Function Navigator for Current Functional Status.   Therapy/Group: Individual Therapy  Luberta Mutter 11/02/2016, 12:01 PM

## 2016-11-02 NOTE — Progress Notes (Signed)
Subjective/Complaints: C/o GERD, dry lips, getting weaker   ROS: pt denies nausea, vomiting, diarrhea, cough, shortness of breath or chest pain     Objective: Vital Signs: Blood pressure (!) 120/57, pulse 64, temperature 97.9 F (36.6 C), temperature source Oral, resp. rate 18, weight 112.9 kg (248 lb 14.4 oz), SpO2 95 %. No results found. Results for orders placed or performed during the hospital encounter of 10/22/16 (from the past 72 hour(s))  Basic metabolic panel     Status: Abnormal   Collection Time: 11/02/16  5:31 AM  Result Value Ref Range   Sodium 131 (L) 135 - 145 mmol/L   Potassium 4.2 3.5 - 5.1 mmol/L   Chloride 97 (L) 101 - 111 mmol/L   CO2 27 22 - 32 mmol/L   Glucose, Bld 121 (H) 65 - 99 mg/dL   BUN 10 6 - 20 mg/dL   Creatinine, Ser 0.71 0.61 - 1.24 mg/dL   Calcium 8.4 (L) 8.9 - 10.3 mg/dL   GFR calc non Af Amer >60 >60 mL/min   GFR calc Af Amer >60 >60 mL/min    Comment: (NOTE) The eGFR has been calculated using the CKD EPI equation. This calculation has not been validated in all clinical situations. eGFR's persistently <60 mL/min signify possible Chronic Kidney Disease.    Anion gap 7 5 - 15     HEENT: normocephalic, atraumatic. Lips dry Cardio: RRR Resp: CTA B GI: BS+, NT, ND Skin:  No skin breakdown on heels Neuro: Alert/Oriented Sensation intact  light touch b/l hands to shoulders, hands and feet remain most involved. Has difficulty with [facility] Motor 4+/5 bilateral delt , bi, tri , grip, HF, KE, ADF --no change in motor or sensory exam Musc/Skel:    Mild pec tenderness bilaterally Uro: condom cath on Psych: still anxious.  Gen NAD.      Assessment/Plan: 1. 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 Physiatrist is providing close team supervision and 24 hour management of active medical problems listed below. Physiatrist and rehab  team continue to assess barriers to discharge/monitor patient progress toward functional and medical goals. FIM: Function - Bathing Position: Shower Body parts bathed by patient: Right arm, Left arm, Chest, Abdomen, Front perineal area, Right upper leg, Left upper leg, Back, Buttocks, Right lower leg, Left lower leg Body parts bathed by helper: Buttocks, Right lower leg, Left lower leg Assist Level: Touching or steadying assistance(Pt > 75%)  Function- Upper Body Dressing/Undressing What is the patient wearing?: Pull over shirt/dress Pull over shirt/dress - Perfomed by patient: Thread/unthread right sleeve, Thread/unthread left sleeve, Pull shirt over trunk, Put head through opening Pull over shirt/dress - Perfomed by helper: Put head through opening Assist Level: Set up Set up : To obtain clothing/put away Function - Lower Body Dressing/Undressing What is the patient wearing?: Pants, Shoes Position: Sitting EOB Pants- Performed by patient: Thread/unthread right pants leg, Thread/unthread left pants leg, Pull pants up/down Pants- Performed by helper: Pull pants up/down Non-skid slipper socks- Performed by patient: Don/doff right sock, Don/doff left sock (sock aid) Non-skid slipper socks- Performed by helper: Don/doff right sock, Don/doff left sock Shoes - Performed by patient: Don/doff right shoe, Don/doff left shoe Shoes - Performed by helper: Don/doff right shoe, Don/doff left shoe Assist for footwear: Supervision/touching assist Assist for lower body dressing: Touching or steadying assistance (Pt > 75%)  Function - Toileting Toileting activity did not occur: No continent bowel/bladder  event Toileting steps completed by helper: Adjust clothing prior to toileting, Performs perineal hygiene, Adjust clothing after toileting Toileting Assistive Devices: Grab bar or rail Assist level: Touching or steadying assistance (Pt.75%)  Function - Air cabin crew transfer activity did not  occur: Safety/medical concerns Toilet transfer assistive device: Elevated toilet seat/BSC over toilet, Grab bar, Walker Assist level to toilet: Touching or steadying assistance (Pt > 75%) Assist level from toilet: Touching or steadying assistance (Pt > 75%)  Function - Chair/bed transfer Chair/bed transfer method: Ambulatory Chair/bed transfer assist level: Touching or steadying assistance (Pt > 75%) Chair/bed transfer assistive device: Armrests, Walker Chair/bed transfer details: Verbal cues for precautions/safety, Verbal cues for technique, Tactile cues for placement, Tactile cues for posture  Function - Locomotion: Wheelchair Type: Manual Max wheelchair distance: 30 ft Assist Level: Supervision or verbal cues Assist Level: Supervision or verbal cues Wheel 150 feet activity did not occur: Safety/medical concerns (fatigue) Turns around,maneuvers to table,bed, and toilet,negotiates 3% grade,maneuvers on rugs and over doorsills: No Function - Locomotion: Ambulation Assistive device: Walker-rolling Max distance: 45 ft Assist level: Touching or steadying assistance (Pt > 75%) Assist level: Touching or steadying assistance (Pt > 75%) Walk 50 feet with 2 turns activity did not occur: Safety/medical concerns Assist level: Touching or steadying assistance (Pt > 75%) Walk 150 feet activity did not occur: Safety/medical concerns Walk 10 feet on uneven surfaces activity did not occur: Safety/medical concerns  Function - Comprehension Comprehension: Auditory Comprehension assist level: Follows basic conversation/direction with no assist  Function - Expression Expression: Verbal Expression assist level: Expresses basic needs/ideas: With no assist  Function - Social Interaction Social Interaction assist level: Interacts appropriately 75 - 89% of the time - Needs redirection for appropriate language or to initiate interaction.  Function - Problem Solving Problem solving assist level: Solves  basic problems with no assist  Function - Memory Memory assist level: Recognizes or recalls 75 - 89% of the time/requires cueing 10 - 24% of the time Patient normally able to recall (first 3 days only): Current season, Location of own room, Staff names and faces, That he or she is in a hospital  Medical Problem List and Plan: 1. 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 DVT Prophylaxis/Anticoagulation: SCDs.   Vascular study neg for DVT. 3. Pain Management: Neurontin  increased to 257m TID---may help with mood also.    -consider further titration next week  -hydrocodone as needed  -fair control 4. Mood: Provide emotional support  -continued anxiety  -xanax prn  -weaning off decadron   5. Neuropsych: This patient iscapable of making decisions on hisown behalf. 6. Skin/Wound Care: Routine skin checks 7. Fluids/Electrolytes/Nutrition: Routine I&Os , BMET 3/12   8.Dysphagia. Dysphagia #1 pudding thick liquids. Follow-up speech therapy. continue IV fluids daily at bedtime, pt hoping for repeat swallow study soon 9.CAD status post CABG. No chest pain or shortness of breath. Discusswith neurosurgery onresuming aspirin 81 mg daily 10.Hypertension. Cozaar 100 mg daily, Lasix 20 mg twice a day. Monitor with increased mobility Vitals:   11/01/16 1555 11/02/16 0407  BP: (!) 110/45 (!) 120/57  Pulse: (!) 58 64  Resp:  18  Temp:  97.9 F (36.6 C)   Control improved 11.MRSA PCR screening positive. Contact precautions 12.Sinusitis.Unasyn changed to augmentin (duration?)---see no further signs, afebrile  -off abx  -wbc's 11.2   12.Hyponatremia.  -likely related to diuretics  - Na+ 131 today 13.Neurogenic bladder: continue  timed voids  -incontinent/ I/C prn 14.neurogenic bowel: emptying bowels better  -miralax change to daily prn      15. Hemorrhoids  Tucks ordered 3/4  - TID sitz bath (not given  yesterday)  .   LOS (Days) 11 A FACE TO FACE EVALUATION WAS PERFORMED  Jazma Pickel T 11/02/2016, 8:51 AM

## 2016-11-02 NOTE — Progress Notes (Signed)
Occupational Therapy Session Note  Patient Details  Name: Joe [name] MRN: [id number] Date of Birth: Mar 06, 1946  Today's Date: 11/02/2016 OT Individual Time: 1300-1359 OT Individual Time Calculation (min): 59 min    Skilled Therapeutic Interventions/Progress Updates: Skilled OT session completed with focus on UE strengthening, fine motor skills, and endurance. Pt ambulated from room to RN station with RW and Min A before fatiguing. He self propelled 20 ft, then reported that his hands "were done." OT escorted him to gym to modify wheels on w/c with theraband to increase gripping ease and decrease frustration. He self propelled 40 ft and reported that modification helped. [facility] remediation completed in family room on visitor computer. Pt typed in web addresses, navigated his email, and typed in beach music songs on youtube with extra time due to precisio/n/sensation deficits. He required Min A for mousing tasks. Pt did exhibit frustration but continued with fine motor tasks and corrected tying errors himself. Pt was then returned to room and ambulated short distance back to bed (with Min A and RW). He was repositioned for comfort and left with all needs within reach at time of departure. Bed alarm activated.        Therapy Documentation Precautions:  Precautions Precautions: Fall Restrictions Weight Bearing Restrictions: No   Vital Signs: Therapy Vitals Temp: 98.2 F (36.8 C) Temp Source: Oral Resp: 20 BP: (!) 89/55 Patient Position (if appropriate): Lying Oxygen Therapy SpO2: 97 % O2 Device: Not Delivered Pain: No c/o pain during session  Pain Assessment Pain Assessment: No/denies pain ADL: ADL ADL Comments: refer to functional navigator :    See Function Navigator for [name] Functional Status.   Therapy/Group: Individual Therapy  Tylon Kemmerling A Jahmarion Popoff 11/02/2016, 4:07 PM

## 2016-11-02 NOTE — Progress Notes (Signed)
Occupational Therapy Session Note  Patient Details  Name: Joe [name] MRN: [id number] Date of Birth: 12-22-1945  Today's Date: 11/02/2016 OT Individual Time: 0730-0830 OT Individual Time Calculation (min): 60 min    Short Term Goals: Week 2:  OT Short Term Goal 1 (Week 2): Pt will complete toileting task with min A OT Short Term Goal 2 (Week 2): Pt will don pants with min A OT Short Term Goal 3 (Week 2): Pt will ambulate to gather clothing with min A in prep for functional task OT Short Term Goal 4 (Week 2): Pt will complete 1 grooming task in standing with close supervision  Skilled Therapeutic Interventions/Progress Updates:    Pt seen for OT session focusing activity tolerance and functional transfes. Pt in supine upon arrival, desiring to work on standing and with multiple complaints of pain and generalized discomfort throughout. Attempted to redirect as pt with tendencies to perseverate on complaints throughout session.  Per pt's request completed exercise on NuStep activity, 5 minutes total, 3 minutes on level 2 and 2 minutes on level 4. He ambulated short distances throughout session with min A, requiring seated rest breaks following ambulation. Seated EOM, he completed UE exercises focusing on pressure control with styrofoam cups focusing on using vision to compensate for decreased sensation. Completed with varying weights of cups. VCs provided throughout for frustration management and emphasis on quality of movements. Pt desired to work on using key in door in prep for home. Ambulated to door way and with increased time able to manipulate key and open door. At table top level worked on Loss adjuster, chartered with built up pen, emphasis on control and elbow stabilization.  He returned to room via w/c at end of session, left seated with all needs in reach. Placed Koband around [place]ok handles for increased traction/ gripping abilities.   Therapy Documentation Precautions:   Precautions Precautions: Fall Restrictions Weight Bearing Restrictions: No ADL: ADL ADL Comments: refer to functional navigator  See Function Navigator for Current Functional Status.   Therapy/Group: Individual Therapy  Lewis, Jonty Morrical C 11/02/2016, 7:08 AM

## 2016-11-02 NOTE — Progress Notes (Signed)
Speech Language Pathology Daily Session Note  Patient Details  Name: Joe [name] MRN: [id number] Date of Birth: Dec 26, 1945  Today's Date: 11/02/2016 SLP Individual Time: 0930-1030 SLP Individual Time Calculation (min): 60 min  Short Term Goals: Week 2: SLP Short Term Goal 1 (Week 2): Given supervision cues pt will use compensatory swallow strategies when consuming current diet to reduce aspiration risk.  SLP Short Term Goal 2 (Week 2): Pt will consume trials of ice chips and small sips of thin liquids without overt s/s of aspiration in preparation for further instrumental swallow study.   Skilled Therapeutic Interventions: Skilled treatment session focused on addressing dysphagia goals. SLP facilitated session by providing skilled observation of patient consuming medications administered by RN crushed in puree with 1-2 swallow per bolus.  SLP also facilitated session with set-up and Supervision level verbal cues to utilize multiple swallows and an intermittent throat clear during moments of wet vocal quality while patient consumed teaspoons of ice chips and water.  Patient only with intermittent reflexive throat clears as compared to strong reflexive coughs during this clinician's last visit.  Given less swallows and reduction in overt s/s of aspiration question if swelling has decreased.  Plan for a repeat objective swallow assessment mid-week to allow maximum time for healing prior to discharge next week.  Continue with current plan of care for now.    Function:  Eating Eating   Modified Consistency Diet: Yes Eating Assist Level: More than reasonable amount of time;Set up assist for;Supervision or verbal cues   Eating Set Up Assist For: Opening containers       Cognition Comprehension Comprehension assist level: Follows basic conversation/direction with extra time/assistive device  Expression   Expression assist level: Expresses basic needs/ideas: With extra time/assistive  device  Social Interaction Social Interaction assist level: Interacts appropriately 50 - 74% of the time - May be physically or verbally inappropriate.  Problem Solving Problem solving assist level: Solves basic 75 - 89% of the time/requires cueing 10 - 24% of the time  Memory Memory assist level: Recognizes or recalls 75 - 89% of the time/requires cueing 10 - 24% of the time    Pain Pain Assessment Pain Assessment: No/denies pain  Therapy/Group: Individual Therapy  Joe Roller., CCC-SLP [phone]  Joe [name] 11/02/2016, 12:52 PM

## 2016-11-03 ENCOUNTER — Inpatient Hospital Stay (HOSPITAL_COMMUNITY): Payer: Medicare Other | Admitting: Physical Therapy

## 2016-11-03 ENCOUNTER — Inpatient Hospital Stay (HOSPITAL_COMMUNITY): Payer: Medicare Other | Admitting: Occupational Therapy

## 2016-11-03 ENCOUNTER — Inpatient Hospital Stay (HOSPITAL_COMMUNITY): Payer: Medicare Other | Admitting: Speech Pathology

## 2016-11-03 MED ORDER — GABAPENTIN 300 MG PO CAPS
300.0000 mg | ORAL_CAPSULE | Freq: Three times a day (TID) | ORAL | Status: DC
  Administered 2016-11-03 – 2016-11-11 (×24): 300 mg via ORAL
  Filled 2016-11-03 (×24): qty 1

## 2016-11-03 MED ORDER — DEXAMETHASONE 2 MG PO TABS
2.0000 mg | ORAL_TABLET | Freq: Two times a day (BID) | ORAL | Status: DC
  Administered 2016-11-03 – 2016-11-04 (×3): 2 mg via ORAL
  Filled 2016-11-03 (×3): qty 1

## 2016-11-03 NOTE — Progress Notes (Signed)
Subjective/Complaints: Had a rough night. Only slept an hour and a half. Had problems with IV. Incontinent too  ROS: pt denies nausea, vomiting, diarrhea, cough, shortness of breath or chest pain      Objective: Vital Signs: Blood pressure 121/77, pulse 62, temperature 97.8 F (36.6 C), temperature source Oral, resp. rate 18, weight 112.9 kg (248 lb 14.4 oz), SpO2 98 %. No results found. Results for orders placed or performed during the hospital encounter of 10/22/16 (from the past 72 hour(s))  Basic metabolic panel     Status: Abnormal   Collection Time: 11/02/16  5:31 AM  Result Value Ref Range   Sodium 131 (L) 135 - 145 mmol/L   Potassium 4.2 3.5 - 5.1 mmol/L   Chloride 97 (L) 101 - 111 mmol/L   CO2 27 22 - 32 mmol/L   Glucose, Bld 121 (H) 65 - 99 mg/dL   BUN 10 6 - 20 mg/dL   Creatinine, Ser 0.71 0.61 - 1.24 mg/dL   Calcium 8.4 (L) 8.9 - 10.3 mg/dL   GFR calc non Af Amer >60 >60 mL/min   GFR calc Af Amer >60 >60 mL/min    Comment: (NOTE) The eGFR has been calculated using the CKD EPI equation. This calculation has not been validated in all clinical situations. eGFR's persistently <60 mL/min signify possible Chronic Kidney Disease.    Anion gap 7 5 - 15     HEENT: normocephalic, atraumatic. Lips dry Cardio: RRR Resp: CTA B GI: BS+, NT, ND Skin:  No skin breakdown on heels Neuro: Alert/Oriented Sensation intact gross touch b/l hands to shoulders, hands and feet remain most involved--fine touch impaired. Has difficulty with [facility] Motor 4+/5 bilateral delt , bi, tri , grip, HF, KE, ADF --motor exam unchanged.  Musc/Skel:    Mild pec tenderness bilaterally Uro: condom cath on Psych: less anxious.  Gen NAD.      Assessment/Plan: 1. 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 Physiatrist is providing close team supervision and 24 hour management of active  medical problems listed below. Physiatrist and rehab team continue to assess barriers to discharge/monitor patient progress toward functional and medical goals. FIM: Function - Bathing Position: Shower Body parts bathed by patient: Right arm, Left arm, Chest, Abdomen, Front perineal area, Right upper leg, Left upper leg, Back, Buttocks, Right lower leg, Left lower leg Body parts bathed by helper: Buttocks, Right lower leg, Left lower leg Assist Level: Touching or steadying assistance(Pt > 75%)  Function- Upper Body Dressing/Undressing What is the patient wearing?: Pull over shirt/dress Pull over shirt/dress - Perfomed by patient: Thread/unthread right sleeve, Thread/unthread left sleeve, Pull shirt over trunk, Put head through opening Pull over shirt/dress - Perfomed by helper: Put head through opening Assist Level: Set up Set up : To obtain clothing/put away Function - Lower Body Dressing/Undressing What is the patient wearing?: Pants, Shoes Position: Sitting EOB Pants- Performed by patient: Thread/unthread right pants leg, Thread/unthread left pants leg, Pull pants up/down Pants- Performed by helper: Pull pants up/down Non-skid slipper socks- Performed by patient: Don/doff right sock, Don/doff left sock (sock aid) Non-skid slipper socks- Performed by helper: Don/doff right sock, Don/doff left sock Shoes - Performed by patient: Don/doff right shoe, Don/doff left shoe Shoes - Performed by helper: Don/doff right shoe, Don/doff left shoe Assist for footwear: Supervision/touching assist Assist for lower body dressing: Touching or steadying assistance (Pt > 75%)  Function - Toileting  Toileting activity did not occur: No continent bowel/bladder event Toileting steps completed by helper: Adjust clothing prior to toileting, Performs perineal hygiene, Adjust clothing after toileting Toileting Assistive Devices: Grab bar or rail Assist level: Touching or steadying assistance  (Pt.75%)  Function - Air cabin crew transfer activity did not occur: Safety/medical concerns Toilet transfer assistive device: Elevated toilet seat/BSC over toilet, Grab bar, Walker Assist level to toilet: Touching or steadying assistance (Pt > 75%) Assist level from toilet: Touching or steadying assistance (Pt > 75%)  Function - Chair/bed transfer Chair/bed transfer method: Stand pivot, Ambulatory Chair/bed transfer assist level: Touching or steadying assistance (Pt > 75%) Chair/bed transfer assistive device: Armrests, Walker Chair/bed transfer details: Verbal cues for precautions/safety, Verbal cues for technique, Tactile cues for placement, Tactile cues for posture  Function - Locomotion: Wheelchair Type: Manual Max wheelchair distance: 75 Assist Level: Supervision or verbal cues Assist Level: Supervision or verbal cues Wheel 150 feet activity did not occur: Safety/medical concerns (fatigue) Turns around,maneuvers to table,bed, and toilet,negotiates 3% grade,maneuvers on rugs and over doorsills: No Function - Locomotion: Ambulation Assistive device: Walker-rolling Max distance: 150 Assist level: Touching or steadying assistance (Pt > 75%) Assist level: Touching or steadying assistance (Pt > 75%) Walk 50 feet with 2 turns activity did not occur: Safety/medical concerns Assist level: Touching or steadying assistance (Pt > 75%) Walk 150 feet activity did not occur: Safety/medical concerns Assist level: Touching or steadying assistance (Pt > 75%) Walk 10 feet on uneven surfaces activity did not occur: Safety/medical concerns  Function - Comprehension Comprehension: Auditory Comprehension assist level: Follows basic conversation/direction with extra time/assistive device  Function - Expression Expression: Verbal Expression assist level: Expresses basic needs/ideas: With extra time/assistive device  Function - Social Interaction Social Interaction assist level:  Interacts appropriately 50 - 74% of the time - May be physically or verbally inappropriate.  Function - Problem Solving Problem solving assist level: Solves basic 75 - 89% of the time/requires cueing 10 - 24% of the time  Function - Memory Memory assist level: Recognizes or recalls 75 - 89% of the time/requires cueing 10 - 24% of the time Patient normally able to recall (first 3 days only): Current season, Location of own room, Staff names and faces, That he or she is in a hospital  Medical Problem List and Plan: 1. 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 DVT Prophylaxis/Anticoagulation: SCDs.   Vascular study neg for DVT. 3. Pain Management: Neurontin   .    -increase to 391m TID. Seems to be helping a bit with mood too  -hydrocodone as needed  -fair control 4. Mood: Provide emotional support  -continued anxiety  -xanax prn  -weaning off decadron   5. Neuropsych: This patient iscapable of making decisions on hisown behalf. 6. Skin/Wound Care: Routine skin checks 7. Fluids/Electrolytes/Nutrition: -labs steady   8.Dysphagia. Dysphagia #1 pudding thick liquids.   -improving voice and cough. On water protocol  -hope to repeat MBS over next 1-2 days  -IVF at night for now 9.CAD status post CABG. No chest pain or shortness of breath. Discusswith neurosurgery onresuming aspirin 81 mg daily 10.Hypertension. Cozaar 100 mg daily, Lasix 20 mg twice a day. Monitor with increased mobility Vitals:   11/02/16 1523 11/03/16 0513  BP: (!) 89/55 121/77  Pulse:  62  Resp: 20 18  Temp: 98.2 F (36.8 C) 97.8 F (36.6 C)  Control improved 11.MRSA PCR screening positive. Contact precautions 12.Sinusitis.Unasyn changed to augmentin (duration?)---see no further signs, afebrile  -off abx  -wbc's 11.2   12.Hyponatremia.  -likely related to diuretics  - Na+ 131    13.Neurogenic bladder:    -incontinent/ I/C prn  -needs timed voids 14.neurogenic bowel: emptying bowels better  -miralax change to daily prn      15. Hemorrhoids  Tucks ordered 3/4  - TID sitz bath ---change to prn  .   LOS (Days) 12 A FACE TO FACE EVALUATION WAS PERFORMED  Cesar Rogerson T 11/03/2016, 8:52 AM

## 2016-11-03 NOTE — Patient Care Conference (Signed)
Inpatient RehabilitationTeam Conference and Plan of Care Update Date: 11/03/2016   Time: 2:15 PM    Patient Name: Joe [name]      Medical Record Number: [id number]  Date of Birth: 04-15-1946 Sex: Male         Room/Bed: 4M07C/4M07C-01 Payor Info: Payor: Marine scientist / Plan: [facility] MEDICARE / Product Type: *No Product type* /    Admitting Diagnosis: Cervical Mylopathy  Admit Date/Time:  10/22/2016  5:44 PM Admission Comments: No comment available   Primary Diagnosis:  Tetraparesis ([place]) Principal Problem: Tetraparesis [facility])  Patient Active Problem List   Diagnosis Date Noted  . Hemorrhoids   . Neurogenic bowel   . Sinusitis   . Benign essential HTN   . Tetraparesis ([place]) 10/22/2016  . Dysphagia, oropharyngeal   . Cervical myelopathy ([place])   . Vertigo   . Coronary artery disease involving coronary bypass graft of native heart without angina pectoris   . Hyponatremia   . Leukocytosis   . Hoarseness 10/17/2016  . Weakness 10/17/2016  . Weakness of distal arms and legs   . Quadriplegia and quadriparesis ([place]) 10/06/2016  . FTT (failure to thrive) in adult 10/06/2016  . Polyneuropathy ([place]) 10/06/2016  . Urinary incontinence 10/06/2016  . Falls 10/06/2016  . Weight gain 03/03/2016  . Morbid obesity ([place]) 01/15/2015  . Left wrist pain 07/17/2014  . Well adult exam 04/16/2014  . Blurred vision 09/25/2013  . Leg pain, bilateral 05/09/2012  . Left groin pain 10/07/2011  . Sinusitis, acute 05/01/2011  . Tobacco abuse 12/03/2010  . Hyperlipidemia   . Constipation 10/23/2010  . NEOPLASM OF UNCERTAIN BEHAVIOR OF SKIN 08/08/2010  . ECZEMA 08/08/2010  . SHOULDER PAIN 07/16/2010  . WRIST PAIN 06/19/2010  . NECK PAIN 06/19/2010  . Pain in limb 06/19/2010  . PARESTHESIA 06/19/2010  . Headache(784.0) 06/19/2010  . CONCUSSION WITH LOSS OF CONSCIOUSNESS 06/19/2010  . Hypogonadism in male 10/07/2009  . B12 deficiency 02/21/2009  . Essential hypertension 10/15/2008   . VISUAL IMPAIRMENT 06/18/2008  . Vitamin D deficiency 03/13/2008  . Pain in joint 03/13/2008  . RECTAL BLEEDING 01/02/2008  . CHANGE IN BOWELS 01/02/2008  . COLONIC POLYPS, HX OF 12/12/2007  . Anxiety state 10/10/2007  . Essential tremor 09/12/2007  . CARPAL TUNNEL SYNDROME 09/12/2007  . Coronary atherosclerosis 09/12/2007  . PVD (peripheral vascular disease) ([place]) 09/12/2007  . VERTIGO 09/12/2007  . SNORING 09/12/2007  . BRONCHITIS, ACUTE 06/30/2007  . Actinic keratosis 06/30/2007  . Allergic rhinitis 05/11/2007  . COPD mixed type ([place]) 05/11/2007  . OSTEOARTHRITIS 05/11/2007    Expected Discharge Date: Expected Discharge Date: 11/11/16  Team Members Present: Physician leading conference: Dr. Alger Simons Social Worker Present: Lennart Pall, LCSW Nurse Present: Heather Roberts, RN PT Present: Canary Brim, Harriet Pho, PT OT Present: Napoleon Form, OT SLP Present: Gunnar Fusi, SLP     Current Status/Progress Goal Weekly Team Focus  Medical   slow sensory recovery. anxiety a little better. needs regular education and reassurance. still getting IVF for hydration  improve activity tolerance and safety awareness. buy in with plan  scheduled caths/voiding attempts, bowel regimen, ongoing education, swallow   Bowel/Bladder   Incontinent to bowel,incontinent to bladder at times with PBR's and I&O cath  Continue with bowel and bladder traine  Timed toiletting Q 2-3 hrs.   Swallow/Nutrition/ Hydration   Supervision-Mod I with Dys.1 textures and pudding-thick liquids   Min A with least restrictive diet  readiness and completion of repeat objective assessment, education  and caregiver training    ADL's   Min-mod A LB bathing/dressing; Set-up UB dressing; mod I grooming;  Supervision-min A functional transfers  mod I eating and grooming; set up bathing and dressing, supervision toileting and ADL transfers  FM coordination; ADL re-training; functional activity tolerance; d/c  planning   Mobility   S bed mobility, min guard gait with RW and stairs, improving activity tolerance and participation  modI bed mobility and transfers, S gait x50' LRAD and 3 stairs for home entry  activity tolerance, LE strengthening, gait/stair training   Communication             Safety/Cognition/ Behavioral Observations            Pain   No c/o of pain  Less than 2,in 1 to 10 scale.  Assess for pain Q 2-3 hrs. and PRN.   Skin   Bottom red,barrier cream in use  To keep skin free of breakdown  Assess skin Q shift and PRN    Rehab Goals Patient on target to meet rehab goals: Yes Rehab Goals Revised: but concern overall with participation and pt's poor frustration tolerance. *See Care Plan and progress notes for long and short-term goals.  Barriers to Discharge: anxiety, pudding liquids, inability to empty bladder    Possible Resolutions to Barriers:  ongoing education, I/O cath education for ?family vs patient, advance diet    Discharge Planning/Teaching Needs:  Plan home with family to arrange 24/7 assistance.  Teaching to be scheduled prior to d/c   Team Discussion:  Continues to need daily education.  Planning another MBS on Thursday.  Anticipate need for I/O caths at home and pt has not been very compliant with getting up to the toilet to empty bowels.  MD to discuss further with him tomorrow.  Min-guard amb up to 150'.  Supervision/ min assist with ADLs dependent on his motivation.  SW to follow up with daughter about concerns.  Revisions to Treatment Plan:  None   Continued Need for Acute Rehabilitation Level of Care: The patient requires daily medical management by a physician with specialized training in physical medicine and rehabilitation for the following conditions: Daily direction of a multidisciplinary physical rehabilitation program to ensure safe treatment while eliciting the highest outcome that is of practical value to the patient.: Yes Daily medical  management of patient stability for increased activity during participation in an intensive rehabilitation regime.: Yes Daily analysis of laboratory values and/or radiology reports with any subsequent need for medication adjustment of medical intervention for : Post surgical problems;Neurological problems;Wound care problems  Keval Nam 11/03/2016, 5:56 PM

## 2016-11-03 NOTE — Progress Notes (Signed)
Physical Therapy Note  Patient Details  Name: Joe [name] MRN: [id number] Date of Birth: 04/09/1946 Today's Date: 11/03/2016    Time: 830-900 30 minutes  1:1 No c/o pain. Pt c/o fatigue from not sleeping last night and previous OT session.  Pt agreeable to participate in gait with max encouragement. Pt performed gait 50' x 4 with RW min A with frequent rest breaks due to fatigue.   Pt asks for ice chips for water protocol but when PT tells pt he needs to clean his mouth/brush teeth before ice chips pt states "i'm sick of doing all this stuff, it's not worth it". Pt refused to complete the rest of the session, missed 30 minutes.  DONAWERTH,KAREN 11/03/2016, 8:59 AM

## 2016-11-03 NOTE — Progress Notes (Addendum)
Pt. Was tacked to the bathroom to implement toiletting schedule,after 15 min,he was unable to void.Pt. Called after he was putted to the bed to said that he wet the bed.PBR was cero at that time.Keep monitoring pt. Closely and assessing his needs.

## 2016-11-03 NOTE — Progress Notes (Signed)
Occupational Therapy Session Note  Patient Details  Name: Joe [name] MRN: [id number] Date of Birth: Mar 17, 1946  Today's Date: 11/03/2016 OT Individual Time: 3299-2426 and 1430-1500 OT Individual Time Calculation (min): 60 min and 30 min   Short Term Goals: Week 2:  OT Short Term Goal 1 (Week 2): Pt will complete toileting task with min A OT Short Term Goal 2 (Week 2): Pt will don pants with min A OT Short Term Goal 3 (Week 2): Pt will ambulate to gather clothing with min A in prep for functional task OT Short Term Goal 4 (Week 2): Pt will complete 1 grooming task in standing with close supervision  Skilled Therapeutic Interventions/Progress Updates:    Session One: Pt seen for OT session focusing on functional transfers. Pt in supine upon arrival, cont with various complaints from bowel/bladder problems, complaints with current Dys. 1 Diet, ect. Attempted to re-direct pt and  faciliate positive thinking, however, pt remained frustrated and difficult. However, once able to be redirected, he was able to participate and actively discuss d/c planning.  Pt with incontinent BM while in supine, total A for hygiene and to don new brief. He transferred to EOB and donned shoes with increased time using reacher and LH shoe horn. Stand pivots completed throughout session with guarding assist. He completed grooming tasks at sink from w/c level mod I with encouragement for independence/ participation.  He self propelled w/c to ADL apartment and completed simulated tub/shower transfer using tub transfer bench with supervision following demonstration. He then completed simulated shower stall transfer utilizing both shower chair and 3-1 BSC options. Pt opting to order tub transfer bench and 3-1 BSC for shower chair- educated regarding insurance coverage/ need for private pay for DME.  Pt self propelled w/c back to room using B LEs, rest breaks required.  He was left seated in w/c at end of session, all  needs in reach with MD present completing [name].   Session Two: Pt asleep in sidelying upon arrival, easily awoken and feeling much better following a nap.  Pt feeling as if he needed to be cathed, agreeable to attempt to void. Initially trialed in supine with no results, transferred to EOB and attempted standing voiding, however, still with no results.  While waiting for nursing, pt ambulated within room with CGA using RW to complete grooming tasks standing at sink. Pt dropping items intermittently however, was able to control frustration to complete task. He returned to EOB and completed hand witting activity utilizing R UE and discussion regarding d/c planning and who will assist at home. Pt unable to identify a caregiver who will be able to perform self-cathing.  Pt left seated on EOB at end of session, all needs in reach.   Therapy Documentation Precautions:  Precautions Precautions: Fall Restrictions Weight Bearing Restrictions: No ADL: ADL ADL Comments: refer to functional navigator  See Function Navigator for Current Functional Status.   Therapy/Group: Individual Therapy  Lewis, Adaleen Hulgan C 11/03/2016, 6:55 AM

## 2016-11-03 NOTE — Progress Notes (Signed)
Physical Therapy Session Note  Patient Details  Name: Joe [name] MRN: [id number] Date of Birth: 05-17-1946  Today's Date: 11/03/2016 PT Individual Time: 1530-1630 PT Individual Time Calculation (min): 60 min   Short Term Goals: Week 2:  PT Short Term Goal 1 (Week 2): Pt will perform bed mobility modI PT Short Term Goal 2 (Week 2): Pt will perform stand pivot transfer with S PT Short Term Goal 3 (Week 2): Pt will perform gait with RW x100' min guard PT Short Term Goal 4 (Week 2): Pt will perform ascent/descent 4 6-inch stairs with min guard  Skilled Therapeutic Interventions/Progress Updates: Tx 1: pt received supine in bed; reporting excessive fatigue from poor night of sleep and morning with several therapy sessions. Pt verbalizing understanding of needing to participate to continue progress towards goals, but politely requesting to hold on therapy at this time. Will follow up for PM session and continue per POC.   Tx 2: Pt received supine in bed, reporting he had incontinent bladder accident and needed to be changed. NT alerted who arrived after brief changed to scan bladder. Rolling R/L with modI using bedrails for therapist to perform hygiene and brief change. Gait x100' with RW and close S; fatigues at end of trial and requires seated rest break. Nustep x7 min total, level  Increased to level 3 during trial, BUE and BLE for strengthening and endurance. Engaged in zoom ball with BUE for strengthening and endurance; performed in sitting due to UE reliance on RW in standing. BUE [place] engaged in card games with increased time due to decreased coordination. Returned to room and stand pivot to bed with S. Remained seated EOB with alarm intact and all needs in reach.      Therapy Documentation Precautions:  Precautions Precautions: Fall Restrictions Weight Bearing Restrictions: No General: PT Amount of Missed Time (min): 60 Minutes PT Missed Treatment Reason: Patient fatigue;Patient  unwilling to participate   See Function Navigator for Current Functional Status.   Therapy/Group: Individual Therapy  Luberta Mutter 11/03/2016, 11:53 AM

## 2016-11-03 NOTE — Progress Notes (Signed)
Speech Language Pathology Daily Session Note  Patient Details  Name: Joe [name] MRN: [id number] Date of Birth: 08/17/1946  Today's Date: 11/03/2016 SLP Individual Time: 3005-1102 SLP Individual Time Calculation (min): 27 min  Short Term Goals: Week 2: SLP Short Term Goal 1 (Week 2): Given supervision cues pt will use compensatory swallow strategies when consuming current diet to reduce aspiration risk.  SLP Short Term Goal 2 (Week 2): Pt will consume trials of ice chips and small sips of thin liquids without overt s/s of aspiration in preparation for further instrumental swallow study.   Skilled Therapeutic Interventions: Skilled treatment session focused on addressing dysphagia goals. SLP facilitated session by providing Set-up assist and Total assist to self-feed teaspoon sips of honey and nectar-thick liquids with what appeared to be more timely swallow initiation, with 1-2 swallows and no overt s/s of aspiration.  SLP also introduced [facility] which patient completed x5 with Mod assist cues for encouragement. Given sensory deficits patient requires objective assessment prior to upgrade; plan to discuss timing of this with MD.  Continue with current plan of care.    Function:  Eating Eating   Modified Consistency Diet: Yes Eating Assist Level: Helper feeds patient;Set up assist for;More than reasonable amount of time   Eating Set Up Assist For: Opening containers       Cognition Comprehension Comprehension assist level: Follows basic conversation/direction with extra time/assistive device  Expression   Expression assist level: Expresses complex 90% of the time/cues < 10% of the time  Social Interaction Social Interaction assist level: Interacts appropriately 75 - 89% of the time - Needs redirection for appropriate language or to initiate interaction.  Problem Solving Problem solving assist level: Solves basic 75 - 89% of the time/requires cueing 10 - 24% of the time   Memory Memory assist level: Recognizes or recalls 75 - 89% of the time/requires cueing 10 - 24% of the time    Pain Pain Assessment Pain Assessment: No/denies pain  Therapy/Group: Individual Therapy  Carmelia Roller., [place] [phone]  [place] 11/03/2016, 12:23 PM

## 2016-11-04 ENCOUNTER — Inpatient Hospital Stay (HOSPITAL_COMMUNITY): Payer: Medicare Other | Admitting: Occupational Therapy

## 2016-11-04 ENCOUNTER — Inpatient Hospital Stay (HOSPITAL_COMMUNITY): Payer: Medicare Other | Admitting: Physical Therapy

## 2016-11-04 ENCOUNTER — Inpatient Hospital Stay (HOSPITAL_COMMUNITY): Payer: Medicare Other

## 2016-11-04 ENCOUNTER — Inpatient Hospital Stay (HOSPITAL_COMMUNITY): Payer: Medicare Other | Admitting: Speech Pathology

## 2016-11-04 MED ORDER — SENNOSIDES-DOCUSATE SODIUM 8.6-50 MG PO TABS
2.0000 | ORAL_TABLET | Freq: Every day | ORAL | Status: DC
  Administered 2016-11-04 – 2016-11-05 (×2): 2 via ORAL
  Filled 2016-11-04 (×2): qty 2

## 2016-11-04 MED ORDER — EPTIFIBATIDE 20 MG/10ML IV SOLN
INTRAVENOUS | Status: AC
  Filled 2016-11-04: qty 10

## 2016-11-04 MED ORDER — NITROGLYCERIN 1 MG/10 ML FOR IR/CATH LAB
INTRA_ARTERIAL | Status: AC
  Filled 2016-11-04: qty 10

## 2016-11-04 NOTE — Progress Notes (Signed)
Occupational Therapy Session Note  Patient Details  Name: Joe [name] MRN: [id number] Date of Birth: 04-08-1946  Today's Date: 11/04/2016 OT Individual Time: 0730-0830 OT Individual Time Calculation (min): 60 min    Short Term Goals: Week 2:  OT Short Term Goal 1 (Week 2): Pt will complete toileting task with min A OT Short Term Goal 2 (Week 2): Pt will don pants with min A OT Short Term Goal 3 (Week 2): Pt will ambulate to gather clothing with min A in prep for functional task OT Short Term Goal 4 (Week 2): Pt will complete 1 grooming task in standing with close supervision  Skilled Therapeutic Interventions/Progress Updates:    Pt seen for OT ADL bathing/dressing session. Pt sitting EOB upon arrival eating breakfast, agreeable to tx session. He ambulated into bathroom with CGA completing shower transfer with min A to walk in shower with tub transfer bench. He bathed seated on tub bench using LH sponge to assist with LB bathing. Pt dropped washcloth throughout bathing task, unaware of dropping rag. Pt able to manage frustrations during task. Following shower he voiced need for immediate BM. He ambulated with min A into bathroom, positive void for BM and required total A for toileting task. Demonstrated use of inspection mirror for pt to check if hygiene completed adequately, however, not enough time left in tx session for pt to try- will cont to practice in future sessions.  Grooming completed mod I at sink. Pt left seated in w/c at end of session, all needs in reach.   Therapy Documentation Precautions:  Precautions Precautions: Fall Restrictions Weight Bearing Restrictions: No ADL: ADL ADL Comments: refer to functional navigator  See Function Navigator for Current Functional Status.   Therapy/Group: Individual Therapy  Lewis, Halyn Flaugher C 11/04/2016, 7:10 AM

## 2016-11-04 NOTE — Progress Notes (Signed)
Subjective/Complaints: Up on toilet with OT. Having some "sensations" in knees over night. Slept better. Two incontinent voids  ROS: pt denies nausea, vomiting, diarrhea, cough, shortness of breath or chest pain       Objective: Vital Signs: Blood pressure (!) 125/54, pulse (!) 56, temperature 98.1 F (36.7 C), temperature source Oral, resp. rate 17, weight 112.9 kg (248 lb 14.4 oz), SpO2 95 %. No results found. Results for orders placed or performed during the hospital encounter of 10/22/16 (from the past 72 hour(s))  Basic metabolic panel     Status: Abnormal   Collection Time: 11/02/16  5:31 AM  Result Value Ref Range   Sodium 131 (L) 135 - 145 mmol/L   Potassium 4.2 3.5 - 5.1 mmol/L   Chloride 97 (L) 101 - 111 mmol/L   CO2 27 22 - 32 mmol/L   Glucose, Bld 121 (H) 65 - 99 mg/dL   BUN 10 6 - 20 mg/dL   Creatinine, Ser 0.71 0.61 - 1.24 mg/dL   Calcium 8.4 (L) 8.9 - 10.3 mg/dL   GFR calc non Af Amer >60 >60 mL/min   GFR calc Af Amer >60 >60 mL/min    Comment: (NOTE) The eGFR has been calculated using the CKD EPI equation. This calculation has not been validated in all clinical situations. eGFR's persistently <60 mL/min signify possible Chronic Kidney Disease.    Anion gap 7 5 - 15     HEENT: normocephalic, atraumatic. Lips dry Cardio: RRR Resp: CTA B GI: BS+, NT, ND Skin:  No skin breakdown on heels Neuro: Alert/Oriented Sensation intact gross touch b/l hands to shoulders, hands and feet remain most involved--fine touch impaired. Has difficulty with [facility] Motor 4+/5 bilateral delt , bi, tri , grip, HF, KE, ADF --stable.  Musc/Skel:    Mild pec tenderness bilaterally Uro: condom cath on Psych: appropriate.  Gen NAD.      Assessment/Plan: 1. 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 Physiatrist is providing close team supervision and 24 hour management  of active medical problems listed below. Physiatrist and rehab team continue to assess barriers to discharge/monitor patient progress toward functional and medical goals. FIM: Function - Bathing Position: Shower Body parts bathed by patient: Right arm, Left arm, Chest, Abdomen, Front perineal area, Right upper leg, Left upper leg, Right lower leg, Left lower leg Body parts bathed by helper: Buttocks, Back Assist Level: Touching or steadying assistance(Pt > 75%)  Function- Upper Body Dressing/Undressing What is the patient wearing?: Pull over shirt/dress, Hospital gown Pull over shirt/dress - Perfomed by patient: Thread/unthread right sleeve, Thread/unthread left sleeve, Put head through opening, Pull shirt over trunk Pull over shirt/dress - Perfomed by helper: Put head through opening Assist Level: Set up Set up : To obtain clothing/put away Function - Lower Body Dressing/Undressing What is the patient wearing?: Shoes Position: Wheelchair/chair at sink Pants- Performed by patient: Thread/unthread right pants leg, Thread/unthread left pants leg, Pull pants up/down Pants- Performed by helper: Pull pants up/down Non-skid slipper socks- Performed by patient: Don/doff right sock, Don/doff left sock (sock aid) Non-skid slipper socks- Performed by helper: Don/doff right sock, Don/doff left sock Shoes - Performed by patient: Don/doff right shoe, Don/doff left shoe Shoes - Performed by helper: Don/doff right shoe, Don/doff left shoe Assist for footwear: Supervision/touching assist Assist for lower body dressing: Touching or steadying assistance (Pt > 75%)  Function - Toileting Toileting activity did not occur:  No continent bowel/bladder event Toileting steps completed by helper: Adjust clothing prior to toileting, Performs perineal hygiene, Adjust clothing after toileting Toileting Assistive Devices: Grab bar or rail Assist level: Touching or steadying assistance (Pt.75%)  Function - Engineer, petroleum transfer activity did not occur: Safety/medical concerns Toilet transfer assistive device: Elevated toilet seat/BSC over toilet, Grab bar, Walker Assist level to toilet: Touching or steadying assistance (Pt > 75%) Assist level from toilet: Touching or steadying assistance (Pt > 75%)  Function - Chair/bed transfer Chair/bed transfer method: Stand pivot Chair/bed transfer assist level: Supervision or verbal cues Chair/bed transfer assistive device: Armrests, Bedrails Chair/bed transfer details: Verbal cues for precautions/safety, Verbal cues for technique  Function - Locomotion: Wheelchair Type: Manual Max wheelchair distance: 75 Assist Level: Supervision or verbal cues Assist Level: Supervision or verbal cues Wheel 150 feet activity did not occur: Safety/medical concerns (fatigue) Turns around,maneuvers to table,bed, and toilet,negotiates 3% grade,maneuvers on rugs and over doorsills: No Function - Locomotion: Ambulation Assistive device: Walker-rolling Max distance: 100 Assist level: Supervision or verbal cues Assist level: Supervision or verbal cues Walk 50 feet with 2 turns activity did not occur: Safety/medical concerns Assist level: Supervision or verbal cues Walk 150 feet activity did not occur: Safety/medical concerns Assist level: Touching or steadying assistance (Pt > 75%) Walk 10 feet on uneven surfaces activity did not occur: Safety/medical concerns  Function - Comprehension Comprehension: Auditory Comprehension assist level: Follows basic conversation/direction with no assist  Function - Expression Expression: Verbal Expression assist level: Expresses complex 90% of the time/cues < 10% of the time  Function - Social Interaction Social Interaction assist level: Interacts appropriately 75 - 89% of the time - Needs redirection for appropriate language or to initiate interaction.  Function - Problem Solving Problem solving assist level: Solves basic  75 - 89% of the time/requires cueing 10 - 24% of the time  Function - Memory Memory assist level: Recognizes or recalls 75 - 89% of the time/requires cueing 10 - 24% of the time Patient normally able to recall (first 3 days only): Current season, Location of own room, Staff names and faces, That he or she is in a hospital  Medical Problem List and Plan: 1. 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 Explained reasoning behind these plans. The patient voiced an understanding an is willing to proceed  2. DVT Prophylaxis/Anticoagulation: SCDs.   Vascular study neg for DVT. 3. Pain Management: Neurontin   .    -increase to 390m TID. Seems to be helping a bit with mood too  -hydrocodone as needed  -fair control 4. Mood: Provide emotional support  -continued anxiety  -xanax prn  -weaning off decadron   5. Neuropsych: This patient iscapable of making decisions on hisown behalf. 6. Skin/Wound Care: Routine skin checks 7. Fluids/Electrolytes/Nutrition: -labs steady   8.Dysphagia. Dysphagia #1 pudding thick liquids.   -improving voice and cough. On water protocol  -hope to repeat MBS over next 1-2 days  -IVF at night for now 9.CAD status post CABG. No chest pain or shortness of breath. Discusswith neurosurgery onresuming aspirin 81 mg daily 10.Hypertension. Cozaar 100 mg daily, Lasix 20 mg twice a day. Monitor with increased mobility Vitals:   11/03/16 1520 11/04/16 0511  BP: (!) 115/49 (!) 125/54  Pulse: (!) 59 (!) 56  Resp: 18 17  Temp: 97.9 F (36.6 C) 98.1 F (36.7 C)  Control improved 11.MRSA PCR screening positive. Contact precautions 12.Sinusitis.Unasyn changed to augmentin (duration?)---see no further signs, afebrile  -off abx  -wbc's 11.2   12.Hyponatremia.  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 Hemorrhoids  Tucks ordered 3/4  - TID sitz bath ---change to prn  .   LOS (Days) 13 A FACE TO FACE EVALUATION WAS PERFORMED  Nicko Daher T 11/04/2016, 8:44 AM

## 2016-11-04 NOTE — Progress Notes (Signed)
Speech Language Pathology Daily Session Note  Patient Details  Name: Joe [name] MRN: [id number] Date of Birth: [birth year]  Today's Date: 11/04/2016 SLP Individual Time: 0900-1000 SLP Individual Time Calculation (min): 60 min  Short Term Goals: Week 2: SLP Short Term Goal 1 (Week 2): Given supervision cues pt will use compensatory swallow strategies when consuming current diet to reduce aspiration risk.  SLP Short Term Goal 2 (Week 2): Pt will consume trials of ice chips and small sips of thin liquids without overt s/s of aspiration in preparation for further instrumental swallow study.   Skilled Therapeutic Interventions: Skilled treatment session focused on addressing dysphagia goals. SLP facilitated session by providing set-up assist of advanced PO trials.  SLP fed patient Dys.2 textures with Supervision level verbal cues to complete multiple hard effortful swallows with chin tuck.  Patient with delayed cough x1 out of 10 trials.  SLP also facilitated session with nectar-thick liquids via spoon, cup, and straw with no overt s/s of aspiration and intermittent double swallow.  Trials of thin liquids via teaspoon and cup resulted in delayed cough or throat clear in 60-70% of trials.  Recommend repeat objective assessment tomorrow at 0900.  Continue with current plan of care for now.    Function:  Eating Eating   Modified Consistency Diet: Yes Eating Assist Level: More than reasonable amount of time;Set up assist for;Helper feeds patient;Supervision or verbal cues   Eating Set Up Assist For: Opening containers       Cognition Comprehension Comprehension assist level: Understands complex 90% of the time/cues 10% of the time  Expression   Expression assist level: Expresses complex ideas: With extra time/assistive device  Social Interaction Social Interaction assist level: Interacts appropriately 90% of the time - Needs monitoring or encouragement for participation or interaction.   Problem Solving Problem solving assist level: Solves basic 90% of the time/requires cueing < 10% of the time  Memory Memory assist level: Recognizes or recalls 90% of the time/requires cueing < 10% of the time    Pain Pain Assessment Pain Assessment: No/denies pain  Therapy/Group: Individual Therapy  Carmelia Roller., [place] [phone]  [place] 11/04/2016, 9:21 AM

## 2016-11-04 NOTE — Progress Notes (Signed)
Physical Therapy Session Note  Patient Details  Name: Joe [name] MRN: [id number] Date of Birth: Apr 03, 1946  Today's Date: 11/04/2016 PT Individual Time: 1130-1200 PT Individual Time Calculation (min): 30 min   Short Term Goals: Week 2:  PT Short Term Goal 1 (Week 2): Pt will perform bed mobility modI PT Short Term Goal 2 (Week 2): Pt will perform stand pivot transfer with S PT Short Term Goal 3 (Week 2): Pt will perform gait with RW x100' min guard PT Short Term Goal 4 (Week 2): Pt will perform ascent/descent 4 6-inch stairs with min guard  Skilled Therapeutic Interventions/Progress Updates: Pt received supine in bed, reporting fatigue but no pain and agreeable to treatment. Supine LE exercises straight leg raise, hip abduction/adduction, bridging all 3x10 reps. Supine>sit with modI and bedrails. Seated long arc quads and hip flexion marching 3x10. Sit <>stand x10 reps from edge of bed with UE support on bed>RW. Remained seated on EOB per pt request with alarm intact at end of session and all needs in reach.      Therapy Documentation Precautions:  Precautions Precautions: Fall Restrictions Weight Bearing Restrictions: No Pain: Pain Assessment Pain Assessment: No/denies pain  See Function Navigator for Current Functional Status.   Therapy/Group: Individual Therapy  Luberta Mutter 11/04/2016, 12:07 PM

## 2016-11-04 NOTE — Progress Notes (Signed)
Physical Therapy Note  Patient Details  Name: ZHAIRE LOCKER MRN: [id number] Date of Birth: 1946/08/09 Today's Date: 11/04/2016  1500-1600, 60 min individual tx Pain:5/10 low back; declined meds, declined suggestion of side lying position in bed  Bed> w/c to L with RW, min assist.  Balance activity in standing on Airex mat, with bil UEs on RW, x 30 seconds x 2, x 1.5 min x 1. Seated kicking R/LLE of beach balf; scooting R><L without use of UEs to encourage forward lean and loading bil LEs.  Up/down 4 6" high steps, 2 rails with self -selected sequencing, min assist.  Pt left resting in bed with alarm set and all needs within reach.  See function navigator for current status.  Lizzet Hendley 11/04/2016, 12:40 PM

## 2016-11-04 NOTE — Progress Notes (Signed)
Nutrition Follow-up  DOCUMENTATION CODES:   Obesity unspecified  INTERVENTION:  Continue 30 ml Prostat po TID, each supplement provides 100 kcal and 15 grams of protein.   Continue Magic cup TID  Between meals, each supplement provides 290 kcal and 9 grams of protein.  Provide nourishment snacks between meals (puddings/yogurt).  Encourage adequate PO intake.   NUTRITION DIAGNOSIS:   Increased nutrient needs related to  (therapy) as evidenced by estimated needs; ongoing  GOAL:   Patient will meet greater than or equal to 90% of their needs; progressing  MONITOR:   PO intake, Supplement acceptance, Diet advancement, Labs, Weight trends, Skin, I & O's  REASON FOR ASSESSMENT:   Consult Calorie Count  ASSESSMENT:   71 y.o. right handed male with history of tremor with vertigo followed by Dr. Sabra Heck, CAD status post CABG as well as recent anterior cervical decompression C3-C4 discectomy and fusion 10/09/2016  Presented 10/17/2016 with increasing weakness and difficulty swallowing. MRI C-spine reviewed, showing fluid collection around spinal cord. A nasogastric tube was placed for nutritional supportThat patient pulled out and also recently started on diet of dysphagia 1 pudding-thick liquids.  Pt reports his appetite has improved and has been ordering foods he prefers at meals. Pt reports plans for MBS tomorrow morning. He reports his swallowing is strengthening and his fluid consistency will be changed to a thinner liquid. RD to continue Prostat for now to continue with adequate protein needs and will order alternative supplements once diet and fluids change.   Diet Order:  DIET - DYS 1 Room service appropriate? Yes; Fluid consistency: Pudding Thick  Skin:   (Incision on neck)  Last BM:  3/13  Height:   Ht Readings from Last 1 Encounters:  10/17/16 5\' 9"  (1.753 m)    Weight:   Wt Readings from Last 1 Encounters:  11/04/16 252 lb 6.8 oz (114.5 kg)    Ideal Body  Weight:  72.7 kg  BMI:  Body mass index is 37.28 kg/m.  Estimated Nutritional Needs:   Kcal:  2100-2300  Protein:  110-130 grams  Fluid:  2.1 - 2.3 L/day  EDUCATION NEEDS:   No education needs identified at this time  Corrin Parker, MS, RD, LDN Pager # [phone] After hours/ weekend pager # [phone]

## 2016-11-05 ENCOUNTER — Inpatient Hospital Stay (HOSPITAL_COMMUNITY): Payer: Medicare Other | Admitting: Occupational Therapy

## 2016-11-05 ENCOUNTER — Inpatient Hospital Stay (HOSPITAL_COMMUNITY): Payer: Medicare Other

## 2016-11-05 ENCOUNTER — Inpatient Hospital Stay (HOSPITAL_COMMUNITY): Payer: Medicare Other | Admitting: Physical Therapy

## 2016-11-05 ENCOUNTER — Inpatient Hospital Stay (HOSPITAL_COMMUNITY): Payer: Medicare Other | Admitting: Speech Pathology

## 2016-11-05 MED ORDER — DEXAMETHASONE 2 MG PO TABS
1.0000 mg | ORAL_TABLET | Freq: Two times a day (BID) | ORAL | Status: AC
  Administered 2016-11-05 – 2016-11-07 (×4): 1 mg via ORAL
  Filled 2016-11-05 (×4): qty 1

## 2016-11-05 NOTE — Progress Notes (Signed)
Social Work Patient ID: Joe [name], male   DOB: 1946-03-09, 71 y.o.   MRN: [id number]   Spoke with pt's daughter, Magda Paganini, this afternoon to give her news of diet upgrade.  Also, discussing if 24/7 coverage has been arranged.  She expressed frustration about other family members not offering assist as she had hoped they would.  "We're just going to have to do it.  Me and my older brother."  Daughter has been aware since prior to admit that we are recommending 24/7 assist and understands this is the expectation that she and family will coordinate.  Have scheduled family ed with daughter for Tues 3/20 beginning at 9:00 am.     Lennart Pall, LCSW

## 2016-11-05 NOTE — Progress Notes (Signed)
Physical Therapy Session Note  Patient Details  Name: Joe [name] MRN: [id number] Date of Birth: 08-Dec-1945  Today's Date: 11/05/2016 PT Individual Time: 8889-1694 PT Individual Time Calculation (min): 30 min   Short Term Goals: Week 2:  PT Short Term Goal 1 (Week 2): Pt will perform bed mobility modI PT Short Term Goal 2 (Week 2): Pt will perform stand pivot transfer with S PT Short Term Goal 3 (Week 2): Pt will perform gait with RW x100' min guard PT Short Term Goal 4 (Week 2): Pt will perform ascent/descent 4 6-inch stairs with min guard  Skilled Therapeutic Interventions/Progress Updates:    Pt in bed upon arrival, encouraging pt to participate with session. Pt refusing to wear pants, stating that he won't wear any pants until his bowel training is over. From room, pt ambulating down hall with rw and min guard assistance. Cues for posture. After 80 ft pt refusing to go any further and w/c brought from behind. Pt propelling self with pt reports being too tired to go any further after 30 ft. Taking seated rest and propelling another 20 ft. When attempting second ambulation, pt report having bowel movement and required return to room. Pt standing with rw for cleaning. Pt willing to attempt second ambulation X80 ft. Returned to room and sitting EOB with all needs within reach. Encouraged pt to continue to work with therapy to continue to make progress.   Therapy Documentation Precautions:  Precautions Precautions: Fall Restrictions Weight Bearing Restrictions: No  Pain:  Pt denies any pain at time of session.   See Function Navigator for Current Functional Status.   Therapy/Group: Individual Therapy  Cassell Clement, PT, CSCS Pager [phone] Office [phone]  11/05/2016, 3:46 PM

## 2016-11-05 NOTE — Progress Notes (Signed)
Physical Therapy Session Note  Patient Details  Name: JEP DYAS MRN: [id number] Date of Birth: [birth year]  Today's Date: 11/05/2016 PT Individual Time: 1120-1205 PT Individual Time Calculation (min): 45 min   Skilled Therapeutic Interventions/Progress Updates:  Pt reports 4/10 pain.  Worked on improving standing balance and endurance, and functional mobility for discharge home on 21 March per patient reports.  Pt requires significant encouragement to participate in therapy.  Worked on improving strength, safety, and stability with ambulation with front wheel walker.  Pt refuses to wear pants during therapy and therapist has to hold up pt diaper t/o session.  Also performed standing balance with normal base of support with eyes closed (demos significant increased sway) and requires min assist to prevent LOB.  Additionally worked on standing with NBOS on foam pad.  Above performed to address deficits in static balance to decrease fall risk upon D/C.     Therapy Documentation Precautions:  Precautions Precautions: Fall Restrictions Weight Bearing Restrictions: No   See Function Navigator for Current Functional Status.   Therapy/Group: Individual Therapy  Thayer Embleton Hilario Quarry 11/05/2016, 12:36 PM

## 2016-11-05 NOTE — Progress Notes (Signed)
Occupational Therapy Session Note  Patient Details  Name: Joe [name] MRN: [id number] Date of Birth: [birth year]  Today's Date: 11/05/2016 OT Individual Time: 0730-0830 and 1330-1400 OT Individual Time Calculation (min): 60 min and 30 min   Short Term Goals: Week 2:  OT Short Term Goal 1 (Week 2): Pt will complete toileting task with min A OT Short Term Goal 2 (Week 2): Pt will don pants with min A OT Short Term Goal 3 (Week 2): Pt will ambulate to gather clothing with min A in prep for functional task OT Short Term Goal 4 (Week 2): Pt will complete 1 grooming task in standing with close supervision  Skilled Therapeutic Interventions/Progress Updates:    Session One: Pt seen for OT session focusin gon ADL re-training, functional activity tolerance, and grasping abilities. Pt sitting EOB upon arrival eating breakfast. Complaints regarding breakfast and therapy schedule. Pt able to be re-directed and agreeable to tx session.  He refused bathing task this morning. Reports having gotten suppository, however, refused attempted toileting as part of bowel program- continue with education regarding bowel/ bladder program He ambulated throughout room with min A for gown/ brief management. He completed grooming task standing at sink, difficulty managing items due to decreased sensation and control in B UEs.  He was taken to therapy gym total A, for time and energy conservation.  Completed 4 minutes on NuStep per pt request on level 3 for general activity tolerance and ROM.  He completed standing bean bag toss focusing on gross grasping to pick up bean bags in prep for functional grasping in self care tasks. Pt required to reach behind to obtain items in simulation of ROM needed for toileting tasks. PT tolerated ~1 minute standing each trial before requiring seated rest break. VCs for hand placement during sit <> stands. Educated regarding purpose of task and functional carryover.  Pt returned to room  at end of session, left with all needs in reach.  Extensive education and demonstration provided regarding AE for toileting task. Pt very adament that he will not use toileting AE and will rely on caregivers to assist with hygiene until "my hands are working right". Encouragement provided for increased independence with ADLs, reducing caregiver burden, etc. Pt cont to refuse. Will downgrade toileting goal as pt will require assist for thorough hygiene due to impaired UE use.   Session Two: Pt seen for OT session focusing on functional activity tolerance/ balance and UE use. Pt sitting EOB upon arrival, agreeable to tx session. Completed min A squat pivot transfer to chair.  Pt taken to [facility] gym total A for time and energy conservation. Completed Dynavision activity focusing on UE ROM and finger movement isolation. Completed x2 trials in sitting, pt able to reach in all planes to complete task. He then completed x2 trials in standing, tolerating 30 seconds standing each trial before requiring seated rest break. CGA provided for dynamic balance Pt returned to room at end of session, left seated in w/[name] with all needs in reach.   Therapy Documentation Precautions:  Precautions Precautions: Fall Restrictions Weight Bearing Restrictions: No ADL: ADL ADL Comments: refer to functional navigator  See Function Navigator for Current Functional Status.   Therapy/Group: Individual Therapy  Joe [name] 11/05/2016, 7:06 AM

## 2016-11-05 NOTE — Progress Notes (Signed)
Social Work Patient ID: Joe [name], male   DOB: 1945/09/26, 71 y.o.   MRN: [id number]   Have reviewed team conference with pt and left VM for daughter.  Pt understands we are continuing to plan for d/c 3/21 and asked that daughter confirm they have the 24/7 support plan in place.  Pt able to report that he understands he needs to be more proactive with b/b program and pleased to announce he had a BM yesterday!  To follow up with daughter today once MBS completed so an update can be provided.  Continue to follow.  Dashiell Franchino, LCSW

## 2016-11-05 NOTE — Progress Notes (Addendum)
Subjective/Complaints: Up with OT. No new issues. Emptied bladder and bowels yesterday. Had problems again with IV overnight. Anxious for swallow today  ROS: pt denies nausea, vomiting, diarrhea, cough, shortness of breath or chest pain       Objective: Vital Signs: Blood pressure 132/61, pulse (!) 55, temperature 98.3 F (36.8 C), temperature source Oral, resp. rate 17, weight 114.5 kg (252 lb 6.8 oz), SpO2 96 %. No results found. No results found for this or any previous visit (from the past 72 hour(s)).   HEENT: normocephalic, atraumatic. Lips dry Cardio: RRR Resp:  CTA B GI: BS+, NT, ND Skin:  No skin breakdown on heels Neuro: Alert/Oriented Sensation intact gross touch b/l hands to shoulders, hands and feet remain most involved--fine touch impaired. Has difficulty with [facility] Motor 4+/5 bilateral delt , bi, tri , grip, HF, KE, ADF --  Musc/Skel:      Uro: condom cath on Psych: appropriate.  Gen NAD.      Assessment/Plan: 1. 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 Physiatrist is providing close team supervision and 24 hour management of active medical problems listed below. Physiatrist and rehab team continue to assess barriers to discharge/monitor patient progress toward functional and medical goals. FIM: Function - Bathing Position: Shower Body parts bathed by patient: Right arm, Left arm, Chest, Abdomen, Front perineal area, Right upper leg, Left upper leg, Right lower leg, Left lower leg Body parts bathed by helper: Buttocks, Back Assist Level: Touching or steadying assistance(Pt > 75%)  Function- Upper Body Dressing/Undressing What is the patient wearing?: Pull over shirt/dress, Hospital gown Pull over shirt/dress - Perfomed by patient: Thread/unthread right sleeve, Thread/unthread left sleeve, Put head through opening, Pull shirt over trunk Pull  over shirt/dress - Perfomed by helper: Put head through opening Assist Level: Set up Set up : To obtain clothing/put away Function - Lower Body Dressing/Undressing What is the patient wearing?: Shoes Position: Wheelchair/chair at sink Pants- Performed by patient: Thread/unthread right pants leg, Thread/unthread left pants leg, Pull pants up/down Pants- Performed by helper: Pull pants up/down Non-skid slipper socks- Performed by patient: Don/doff right sock, Don/doff left sock (sock aid) Non-skid slipper socks- Performed by helper: Don/doff right sock, Don/doff left sock Shoes - Performed by patient: Don/doff right shoe, Don/doff left shoe Shoes - Performed by helper: Don/doff right shoe, Don/doff left shoe Assist for footwear: Supervision/touching assist Assist for lower body dressing: Touching or steadying assistance (Pt > 75%)  Function - Toileting Toileting activity did not occur: No continent bowel/bladder event Toileting steps completed by patient: Adjust clothing prior to toileting Toileting steps completed by helper: Performs perineal hygiene, Adjust clothing after toileting Toileting Assistive Devices: Grab bar or rail Assist level: Touching or steadying assistance (Pt.75%)  Function - Air cabin crew transfer activity did not occur: Safety/medical concerns Toilet transfer assistive device: Elevated toilet seat/BSC over toilet Assist level to toilet: Maximal assist (Pt 25 - 49%/lift and lower) Assist level from toilet: Touching or steadying assistance (Pt > 75%)  Function - Chair/bed transfer Chair/bed transfer method: Stand pivot Chair/bed transfer assist level: Supervision or verbal cues Chair/bed transfer assistive device: Armrests, Walker Chair/bed transfer details: Verbal cues for precautions/safety, Verbal cues for technique, Verbal cues for safe use of DME/AE  Function - Locomotion: Wheelchair Type: Manual Max wheelchair distance: 75 Assist Level:  Supervision or verbal cues Assist Level: Supervision or verbal cues Wheel 150 feet activity did not occur:  Safety/medical concerns (fatigue) Turns around,maneuvers to table,bed, and toilet,negotiates 3% grade,maneuvers on rugs and over doorsills: No Function - Locomotion: Ambulation Assistive device: Walker-rolling Max distance: 260ft  Assist level: Touching or steadying assistance (Pt > 75%) Assist level: Touching or steadying assistance (Pt > 75%) Walk 50 feet with 2 turns activity did not occur: Safety/medical concerns Assist level: Touching or steadying assistance (Pt > 75%) Walk 150 feet activity did not occur: Safety/medical concerns Assist level: Touching or steadying assistance (Pt > 75%) Walk 10 feet on uneven surfaces activity did not occur: Safety/medical concerns  Function - Comprehension Comprehension: Auditory Comprehension assist level: Follows basic conversation/direction with no assist  Function - Expression Expression: Verbal Expression assist level: Expresses complex 90% of the time/cues < 10% of the time  Function - Social Interaction Social Interaction assist level: Interacts appropriately 75 - 89% of the time - Needs redirection for appropriate language or to initiate interaction.  Function - Problem Solving Problem solving assist level: Solves basic 75 - 89% of the time/requires cueing 10 - 24% of the time  Function - Memory Memory assist level: Recognizes or recalls 75 - 89% of the time/requires cueing 10 - 24% of the time Patient normally able to recall (first 3 days only): Current season, Location of own room, Staff names and faces, That he or she is in a hospital  Medical Problem List and Plan: 1. 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 DVT Prophylaxis/Anticoagulation: SCDs.   Vascular study neg for DVT. 3. Pain Management: Neurontin    .    -increased to 300mg  TID. Seems to be helping a bit with mood too  -hydrocodone as needed  -fair control 4. Mood: Provide emotional support  -continued anxiety  -xanax prn  -weaning off decadron   5. Neuropsych: This patient iscapable of making decisions on hisown behalf. 6. Skin/Wound Care: Routine skin checks 7. Fluids/Electrolytes/Nutrition: -labs ok   8.Dysphagia. Dysphagia #1 pudding thick liquids.   -improving voice and cough. On water protocol  -MBS today  -IVF at night for now 9.CAD status post CABG. No chest pain or shortness of breath. Discusswith neurosurgery onresuming aspirin 81 mg daily 10.Hypertension. Cozaar 100 mg daily, Lasix 20 mg twice a day. Monitor with increased mobility Vitals:   11/04/16 1728 11/05/16 0533  BP: 120/70 132/61  Pulse: 76 (!) 55  Resp: 18 17  Temp: 98.1 F (36.7 C) 98.3 F (36.8 C)   Control improved 11.MRSA PCR screening positive. Contact precautions 12.Sinusitis.Unasyn changed to augmentin (duration?)---see no further signs, afebrile  -off abx  -wbc's 11.2   12.Hyponatremia.  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 Hemorrhoids  Tucks ordered 3/4  - TID sitz bath ---changed to prn  .   LOS (Days) 14 A FACE TO FACE EVALUATION WAS PERFORMED  Lakeisha Waldrop T 11/05/2016, 8:36 AM

## 2016-11-05 NOTE — Progress Notes (Signed)
Speech Language Pathology Weekly Progress and Session Note  Patient Details  Name: Joe [name] MRN: [id number] Date of Birth: [birth year]  Beginning of progress report period: October 29, 2016 End of progress report period: November 05, 2016  Today's Date: 11/05/2016   Treatment Session #1: SLP Individual Time: 1017-5102 SLP Individual Time Calculation (min): 15 min   Treatment Session #2: SLP Individual Time: 5852-7782 SLP Individual Time Calculation (min): 45 min  Short Term Goals: Week 2: SLP Short Term Goal 1 (Week 2): Given supervision cues pt will use compensatory swallow strategies when consuming current diet to reduce aspiration risk.  SLP Short Term Goal 1 - Progress (Week 2): Not met SLP Short Term Goal 2 (Week 2): Pt will consume trials of ice chips and small sips of thin liquids without overt s/s of aspiration in preparation for further instrumental swallow study.  SLP Short Term Goal 2 - Progress (Week 2): Not met    New Short Term Goals: Week 3: SLP Short Term Goal 1 (Week 3): Given supervision cues pt will use compensatory swallow strategies when consuming current diet to reduce aspiration risk.  SLP Short Term Goal 2 (Week 3): Pt will consume trials of ice chips and small sips of thin liquids without overt s/s of aspiration in preparation for further instrumental swallow study.  SLP Short Term Goal 3 (Week 3): Pt will consume nectar thick liquids via spoon without overt s/s of aspiration in preparation for further instrumental swallow study.  SLP Short Term Goal 4 (Week 3): Pt will consume trials of dysphagia 2 without overt s/s of aspiration in preparation for further instrumental swallow study.   Weekly Progress Updates: Pt with some progress towards dysphagia goals as evidenced by recent upgrade to dysphagia 1 with nectar thick liquids via spoon. Modified Barium Swallow Study on 11/05/16 revealed moderate aspiration of thin liquids. Pt requires skilled ST to further  target swallow function and increase functional independence prior to discharge.   Intensity: Minumum of 1-2 x/day, 30 to 90 minutes Frequency: 3 to 5 out of 7 days Duration/Length of Stay: 11/11/16 Treatment/Interventions: Dysphagia/aspiration precaution training;Therapeutic Exercise;Patient/family education;Oral motor exercises   Daily Session  Skilled Therapeutic Interventions:  Skilled treatment session #1: Skilled treatment session focused on dysphagia goals. SLP facilitated session by reviewing swallow funciton and diet recommendation. Pt with difficulty understanding current deficits and acceptance of need for nectar thick liquids.   Skilled treatment session #2: Skilled treatment session focused on dysphagia goals. SLP facilitated session by providing skilled observation of nectar thick liquids via spoon. Pt consumed without overt s/s of aspiration. Education provided to nursing and pt. All voiced understanding. Pt left upright on edge of bed and all needs within reach. Continue per current plan of care.      Function:   Eating Eating   Modified Consistency Diet: Yes Eating Assist Level: More than reasonable amount of time;Set up assist for   Eating Set Up Assist For: Opening containers       Cognition Comprehension Comprehension assist level: Follows basic conversation/direction with no assist  Expression   Expression assist level: Expresses complex 90% of the time/cues < 10% of the time  Social Interaction Social Interaction assist level: Interacts appropriately 75 - 89% of the time - Needs redirection for appropriate language or to initiate interaction.  Problem Solving Problem solving assist level: Solves basic 75 - 89% of the time/requires cueing 10 - 24% of the time  Memory Memory assist level: Recognizes or recalls 90%  of the time/requires cueing < 10% of the time   General    Pain    Therapy/Group: Individual Therapy   [name] B. Rutherford Nail, M.S.,  [place] 11/05/2016, 6:05 PM

## 2016-11-05 NOTE — Progress Notes (Signed)
Physical Therapy Session Note  Patient Details  Name: Joe [name] MRN: [id number] Date of Birth: 11/01/1945  Today's Date: 11/04/2016 PT Individual Time:1400-1430   PT Individual Time Calculation: 30 min   Short Term Goals: Week 1:  PT Short Term Goal 1 (Week 1): Pt will perform bed mobility minA PT Short Term Goal 1 - Progress (Week 1): Met PT Short Term Goal 2 (Week 1): Pt will perform stand pivot transfers minA PT Short Term Goal 2 - Progress (Week 1): Met PT Short Term Goal 3 (Week 1): Pt will ambulate 30' minA with LRAD PT Short Term Goal 3 - Progress (Week 1): Met PT Short Term Goal 4 (Week 1): Pt will initiate stair training PT Short Term Goal 4 - Progress (Week 1): Met Week 2:  PT Short Term Goal 1 (Week 2): Pt will perform bed mobility modI PT Short Term Goal 2 (Week 2): Pt will perform stand pivot transfer with S PT Short Term Goal 3 (Week 2): Pt will perform gait with RW x100' min guard PT Short Term Goal 4 (Week 2): Pt will perform ascent/descent 4 6-inch stairs with min guard  Skilled Therapeutic Interventions/Progress Updates:  Pt received sitting in EOM and agreeable to PT.   Stand pivot tranfers training completed x 6 throughout treatment with min assist from PT for clothing management and safety with min instruction from PT for AD management.   Pt transported to rehab gym in [facility] with total Assist for time management. Nustep UE and LE reciprocal movement training x 4 minutes on level 4. Gait training x 226f with RW and min assist from with on instance on near LOB due to poor foot clearance. Standing tolerance/ balance with Forefoot on blue wedge 2 bouts x 1 minute. Patient notes significant stretch in BLE heel cord.   Pt returned to room and performed stand pivot transfer to bed with min assist from PT and use of RW. Pt left sitting EOB with call bell in reach and all needs met.      Therapy Documentation Precautions:  Precautions Precautions:  Fall Restrictions Weight Bearing Restrictions: No    Pain: 4/10 in low back.   See Function Navigator for Current Functional Status.   Therapy/Group: Individual Therapy  ALorie Phenix3/15/2018, 5:08 AM

## 2016-11-05 NOTE — Progress Notes (Signed)
Speech Language Pathology Note  Patient Details  Name: Joe [name] MRN: [id number] Date of Birth: Apr 14, 1946 Today's Date: 11/05/2016  MBSS complete see full report under imaging section.  Aldonia Keeven B. Rutherford Nail, M.S., CCC-SLP Speech-Language Pathologist    Rhona Fusilier 11/05/2016, 12:50 PM

## 2016-11-06 ENCOUNTER — Inpatient Hospital Stay (HOSPITAL_COMMUNITY): Payer: Medicare Other | Admitting: Speech Pathology

## 2016-11-06 ENCOUNTER — Inpatient Hospital Stay (HOSPITAL_COMMUNITY): Payer: Medicare Other | Admitting: Physical Therapy

## 2016-11-06 ENCOUNTER — Inpatient Hospital Stay (HOSPITAL_COMMUNITY): Payer: Medicare Other | Admitting: Occupational Therapy

## 2016-11-06 MED ORDER — SENNOSIDES-DOCUSATE SODIUM 8.6-50 MG PO TABS
1.0000 | ORAL_TABLET | Freq: Every day | ORAL | Status: DC
  Filled 2016-11-06 (×2): qty 1

## 2016-11-06 MED ORDER — FLEET ENEMA 7-19 GM/118ML RE ENEM: 1.0000 | ENEMA | Freq: Every day | RECTAL | Status: DC | PRN

## 2016-11-06 NOTE — Plan of Care (Signed)
Problem: RH Bathing Goal: LTG Patient will bathe with assist, cues/equipment (OT) LTG: Patient will bathe specified number of body parts with assist with/without cues using equipment (position)  (OT)  Goal downgraded due to pt progress.- AL 3/16  Problem: RH Toileting Goal: LTG Patient will perform toileting w/assist, cues/equip (OT) LTG: Patient will perform toiletiing (clothes management/hygiene) with assist, with/without cues using equipment (OT)  Goal downgraded due to pt progress. Pt unwilling to use AE for toileting needs. Reports family will provide needed assist at d/c. Donalee Gaumond Lewis, OTR/L  Problem: RH Simple Meal Prep Goal: LTG Patient will perform simple meal prep w/assist (OT) LTG: Patient will perform simple meal prep with assistance, with/without cues (OT).  Outcome: Not Applicable Date Met: 09/31/12 Goal d/c as not a priority at this time. Napoleon Form, OTR/L

## 2016-11-06 NOTE — Progress Notes (Signed)
Speech Language Pathology Daily Session Note  Patient Details  Name: Joe [name] MRN: [id number] Date of Birth: 11/02/1945  Today's Date: 11/06/2016 SLP Individual Time: 1100-1200 SLP Individual Time Calculation (min): 60 min  Short Term Goals: Week 3: SLP Short Term Goal 1 (Week 3): Given supervision cues pt will use compensatory swallow strategies when consuming current diet to reduce aspiration risk.  SLP Short Term Goal 2 (Week 3): Pt will consume trials of ice chips and small sips of thin liquids without overt s/s of aspiration in preparation for further instrumental swallow study.  SLP Short Term Goal 3 (Week 3): Pt will consume nectar thick liquids via spoon without overt s/s of aspiration in preparation for further instrumental swallow study.  SLP Short Term Goal 4 (Week 3): Pt will consume trials of dysphagia 2 without overt s/s of aspiration in preparation for further instrumental swallow study.   Skilled Therapeutic Interventions: Skilled treatment session focused on dysphagia goals. SLP facilitated session by providing skilled observation of pt consuming nectar thick liquids via spoon and via small cup sips as well as trials of dysphagia 2 snacks. Pt without overt s/s of aspiration. No wet voice and no coughing, good improvement over previous sessions. All questions answered to pt's satisfaction at this time. Will speak with MD regarding Modified Barium Swallow Study on Tuesday 3/20 before pt discharges home. Pt was left edge of bed and all needs within reach. Continue current plan of care.      Function:  Eating Eating   Modified Consistency Diet: Yes Eating Assist Level: More than reasonable amount of time;Set up assist for   Eating Set Up Assist For: Opening containers       Cognition Comprehension Comprehension assist level: Follows basic conversation/direction with no assist  Expression   Expression assist level: Expresses complex 90% of the time/cues < 10% of  the time  Social Interaction Social Interaction assist level: Interacts appropriately 75 - 89% of the time - Needs redirection for appropriate language or to initiate interaction.  Problem Solving Problem solving assist level: Solves basic problems with no assist  Memory Memory assist level: More than reasonable amount of time    Pain    Therapy/Group: Individual Therapy  [name] B. Rutherford Nail, M.S., CCC-SLP Speech-Language Pathologist  Loras Grieshop 11/06/2016, 11:43 AM

## 2016-11-06 NOTE — Progress Notes (Signed)
Occupational Therapy Weekly Progress Note  Patient Details  Name: Joe [name] MRN: [id number] Date of Birth: 04-07-1946  Beginning of progress report period: October 30, 2016 End of progress report period: November 06, 2016  Today's Date: 11/06/2016 OT Individual Time: 2774-1287 and 1400-1500 OT Individual Time Calculation (min): 72 min and 60 min   Patient has met 3 of 4 short term goals.  Pt making steady progress towards goals. He cont to be most limited by decreased functional grasp/ coordination in B UEs, decreased activity tolerance, and poor frustration tolerance. Pt has  Made gains in all areas of function this week and is overall min A. Pt not receptive to using AE for toileting tasks for increased independence, pt reports family can provide needed assist at d/c.   Patient continues to demonstrate the following deficits: muscle weakness and muscle paralysis, decreased cardiorespiratoy endurance and decreased standing balance, decreased postural control and decreased balance strategies and therefore will continue to benefit from skilled OT intervention to enhance overall performance with BADL and Reduce care partner burden.  Patient not progressing toward long term goals.  See goal revision..  Plan of care revisions: Toileting and bathing goal downgraded, meal prep goal d/c..  OT Short Term Goals Week 2:  OT Short Term Goal 1 (Week 2): Pt will complete toileting task with min A OT Short Term Goal 1 - Progress (Week 2): Not met OT Short Term Goal 2 (Week 2): Pt will don pants with min A OT Short Term Goal 2 - Progress (Week 2): Not met OT Short Term Goal 3 (Week 2): Pt will ambulate to gather clothing with min A in prep for functional task OT Short Term Goal 3 - Progress (Week 2): Met OT Short Term Goal 4 (Week 2): Pt will complete 1 grooming task in standing with close supervision OT Short Term Goal 4 - Progress (Week 2): Met Week 3:  OT Short Term Goal 1 (Week 3): STG=LTG due to  LOS  Skilled Therapeutic Interventions/Progress Updates:    Session One: Pt sitting EOB upon arrival with MD present completing morning Aithan Farrelly. Complaints of meal tray and provided pt with nectar thick orange juice per pt's request.  Pt refusing on ADL tasks this morning despite encouragement and education regarding role of OT, OT goals, and return to home. Pt cont to refuse and is only willing to complete activities of his choosing.  He ambulated with close supervision from room to nurses station before requiring seated rest break. He completed 7 minutes on NuStep level 3.  Seated EOM, pt completed fine motor activity manipulating game pieces focusing on in-hand manipulation and pincer grasp. Pt ambulated ~20 ft towards room before returning to w/c. Upon return to room pt voiced having had incontinent BM, still actively going though refused to get to toilet. Total A for hygiene and donning new brief. Pt returned to EOB at end of session, left seated with all needs in reach. Extensive education provided regarding importance of managing bowel program, importance of participation and independence with ADL, and d/c planning.   Session Two: Pt seen for OT session focusing on UE ROM, activity tolerance and d/c planning/ education. Pt in side-lying upon arrival, voicing increased fatigue from incontinent BMs and earlier therapy sessions. He refused any OOB tx and with strong encouragement pt willing to attempt session EOB. He was educated regarding right to refuse as well as benefits of participation with ADLs/ therapy.  He transferred to EOB. Completed laundry folding task  utilizing B UEs in sitting, then completed in standing with CGA.  Extensive time spent going over home routine, pt displays decreased awareness of deficits in regards to abilities at d/c. Pt convinced bowels will "work themselves out within a day or 2" of being at home, he will be completing simple meal prep, etc. Independently. Attempted  to redirect to current level of function and need for assist as well as medical reason for bowel complications and that "change in environment" will not be reason for bowels to return to normal.  Discussed current diet, home bathroom routine, scheduled help available, pt's expectations for caregivers, and overall d/c planning.  Pt left seated EOB at end of session, all needs in reach.   Therapy Documentation Precautions:  Precautions Precautions: Fall Restrictions Weight Bearing Restrictions: No ADL: ADL ADL Comments: refer to functional navigator  See Function Navigator for Current Functional Status.   Therapy/Group: Individual Therapy  Lewis, Brooklen Runquist C 11/06/2016, 6:55 AM

## 2016-11-06 NOTE — Progress Notes (Signed)
Physical Therapy Weekly Progress Note  Patient Details  Name: Joe [name] MRN: [id number] Date of Birth: 22-Mar-1946  Beginning of progress report period: October 30, 2016 End of progress report period: November 06, 2016  Today's Date: 11/06/2016 PT Individual Time:  -      Patient has met 4 of 4 short term goals.  Pt currently requires close S/ min guard for all mobility tasks, modI bed mobility. Pt with improving participation in therapy sessions, however does continue to require ongoing education and encouragement regarding purpose and goals of therapy. Continues to be limited by incontinent bowel movements, resistance to consistent bowel program. Will continue to benefit to IP rehab services to progress towards long term goals.   Patient continues to demonstrate the following deficits muscle weakness, decreased cardiorespiratoy endurance, impaired timing and sequencing and decreased coordination and decreased standing balance, decreased postural control and decreased balance strategies and therefore will continue to benefit from skilled PT intervention to increase functional independence with mobility.  Patient progressing toward long term goals..  Continue plan of care.  PT Short Term Goals Week 2:  PT Short Term Goal 1 (Week 2): Pt will perform bed mobility modI PT Short Term Goal 1 - Progress (Week 2): Met PT Short Term Goal 2 (Week 2): Pt will perform stand pivot transfer with S PT Short Term Goal 2 - Progress (Week 2): Met PT Short Term Goal 3 (Week 2): Pt will perform gait with RW x100' min guard PT Short Term Goal 3 - Progress (Week 2): Met PT Short Term Goal 4 (Week 2): Pt will perform ascent/descent 4 6-inch stairs with min guard PT Short Term Goal 4 - Progress (Week 2): Met Week 3:  PT Short Term Goal 1 (Week 3): =LTG due to estimated LOS  Skilled Therapeutic Interventions/Progress Updates: Pt received seated on EOB, reports he just completed hygiene and brief change after  incontinent bowel movement. Pt declines any "leg stuff" including gait and LE exercises, stating his "legs are wore plum out". Stand pivot transfer bed>w/c with S and arm rests. Engaged pt in card games and demonstrating card tricks at table top for [facility]. UE ergometer x6 min total with rest break at 3 min d/t fatigue; performed for UE strengthening and endurance. Returned to room w/c propulsion with S x100'; increased distance and activity tolerance when distracted in conversation with therapist. Stand pivot to return to bed with S. Remained seated on EOB per pt preference with all needs in reach.       Therapy Documentation Precautions:  Precautions Precautions: Fall Restrictions Weight Bearing Restrictions: No   See Function Navigator for Current Functional Status.  Therapy/Group: Individual Therapy  Luberta Mutter 11/06/2016, 7:36 AM
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
## 2016-11-07 ENCOUNTER — Inpatient Hospital Stay (HOSPITAL_COMMUNITY): Payer: Medicare Other | Admitting: Physical Therapy

## 2016-11-07 NOTE — Progress Notes (Signed)
Physical Therapy Session Note  Patient Details  Name: Joe [name] MRN: [id number] Date of Birth: Sep 11, 1945  Today's Date: 11/07/2016 PT Individual Time: 0900-0957 PT Individual Time Calculation (min): 57 min   Short Term Goals: Week 1:  PT Short Term Goal 1 (Week 1): Pt will perform bed mobility minA PT Short Term Goal 1 - Progress (Week 1): Met PT Short Term Goal 2 (Week 1): Pt will perform stand pivot transfers minA PT Short Term Goal 2 - Progress (Week 1): Met PT Short Term Goal 3 (Week 1): Pt will ambulate 30' minA with LRAD PT Short Term Goal 3 - Progress (Week 1): Met PT Short Term Goal 4 (Week 1): Pt will initiate stair training PT Short Term Goal 4 - Progress (Week 1): Met  Skilled Therapeutic Interventions/Progress Updates:  Pt transferred supine to edge of bed with S. Pt transferred stand pivot with rolling walker and min guard to min A. Pt propelled w/c about 70 feet with B UEs and S. Pt performed multiple sit to stand and stand pivot transfers with rolling walker and min guard to min A. Pt performed toilet transfers with rolling walker and min guard to min A. Dependent for hygiene. Pt rode Nu step 5 minutes at level 4 and 1 minute at level 5. Pt performed cone taps for NMR. Pt returned to room following treatment and left sitting up with call bell within reach.    Therapy Documentation Precautions:  Precautions Precautions: Fall Restrictions Weight Bearing Restrictions: No General:  Pain: No c/o pain.    Other Treatments:     See Function Navigator for Current Functional Status.   Therapy/Group: Individual Therapy  Dub Amis 11/07/2016, 1:13 PM

## 2016-11-07 NOTE — Progress Notes (Signed)
Joe [name] is a 71 y.o. male 1946/01/04 [id number]  Subjective: No new complaints - c/o weakness. No new problems. Slept well. Feeling OK.  Objective: Vital signs in last 24 hours: Temp:  [97.5 F (36.4 C)-98 F (36.7 C)] 98 F (36.7 C) (03/17 1451) Pulse Rate:  [62-64] 62 (03/17 1451) Resp:  [18] 18 (03/17 1451) BP: (110-139)/(54-61) 110/54 (03/17 1451) SpO2:  [96 %-97 %] 97 % (03/17 1451) Weight change:  Last BM Date: 11/06/16  Intake/Output from previous day: 03/16 0701 - 03/17 0700 In: 240 [P.O.:240] Out: 450 [Urine:450] Last cbgs: CBG (last 3)  No results for input(s): GLUCAP in the last 72 hours.   Physical Exam General: No apparent distress  - in bed HEENT: not dry Lungs: Normal effort. Lungs clear to auscultation, no crackles or wheezes. Cardiovascular: Regular rate and rhythm, no edema Abdomen: S/NT/ND; BS(+) Musculoskeletal:  unchanged Neurological: No new neurological deficits Wounds: clean  Skin: clear  Aging changes Mental state: Alert, oriented, cooperative    Lab Results: BMET    Component Value Date/Time   NA 131 (L) 11/02/2016 0531   K 4.2 11/02/2016 0531   CL 97 (L) 11/02/2016 0531   CO2 27 11/02/2016 0531   GLUCOSE 121 (H) 11/02/2016 0531   BUN 10 11/02/2016 0531   CREATININE 0.71 11/02/2016 0531   CALCIUM 8.4 (L) 11/02/2016 0531   GFRNONAA >60 11/02/2016 0531   GFRAA >60 11/02/2016 0531   CBC    Component Value Date/Time   WBC 11.2 (H) 10/30/2016 0538   RBC 4.40 10/30/2016 0538   HGB 13.8 10/30/2016 0538   HCT 40.1 10/30/2016 0538   PLT 234 10/30/2016 0538   MCV 91.1 10/30/2016 0538   MCH 31.4 10/30/2016 0538   MCHC 34.4 10/30/2016 0538   RDW 12.9 10/30/2016 0538   LYMPHSABS 1.2 10/23/2016 0523   MONOABS 0.8 10/23/2016 0523   EOSABS 0.0 10/23/2016 0523   BASOSABS 0.0 10/23/2016 0523    Studies/Results: No results found.  Medications: I have reviewed the patient's current medications.  Assessment/Plan:  1.  Tetraparesis - CIR 2. DVT proph - SCD 3. Pain - gabapentin, Norco prn 4. Anxiety. Xanax prn 5. HTN - Cozaar, Lasix 6. CAD. No CP 7. Wt loss - discussed. It is good 8. Neurogenic bladder and bowel. Dulcolax      Length of stay, days: [facility] , MD 11/07/2016, 3:34 PM

## 2016-11-08 ENCOUNTER — Inpatient Hospital Stay (HOSPITAL_COMMUNITY): Payer: Medicare Other

## 2016-11-08 NOTE — Progress Notes (Signed)
Joe [name] is a 71 y.o. male [birth year] [id number]  Subjective: No new complaints. No new problems. Slept well. Feeling OK.  Objective: Vital signs in last 24 hours: Temp:  [98 F (36.7 C)-99.1 F (37.3 C)] 99.1 F (37.3 C) (03/18 0456) Pulse Rate:  [58-64] 58 (03/18 0456) Resp:  [18] 18 (03/18 0456) BP: (110-139)/(52-61) 110/52 (03/18 0456) SpO2:  [97 %-98 %] 98 % (03/18 0456) Weight change:  Last BM Date: 11/08/16  Intake/Output from previous day: 03/17 0701 - 03/18 0700 In: 600 [P.O.:600] Out: 934 [Urine:934] Last cbgs: CBG (last 3)  No results for input(s): GLUCAP in the last 72 hours.   Physical Exam General: No apparent distress   HEENT: not dry Lungs: Normal effort. Lungs clear to auscultation, no crackles or wheezes. Cardiovascular: Regular rate and rhythm, no edema Abdomen: S/NT/ND; BS(+) Musculoskeletal:  unchanged Neurological: No new neurological deficits Wounds: N/A    Skin: clear  Aging changes Mental state: Alert, oriented, cooperative    Lab Results: BMET    Component Value Date/Time   NA 131 (L) 11/02/2016 0531   K 4.2 11/02/2016 0531   CL 97 (L) 11/02/2016 0531   CO2 27 11/02/2016 0531   GLUCOSE 121 (H) 11/02/2016 0531   BUN 10 11/02/2016 0531   CREATININE 0.71 11/02/2016 0531   CALCIUM 8.4 (L) 11/02/2016 0531   GFRNONAA >60 11/02/2016 0531   GFRAA >60 11/02/2016 0531   CBC    Component Value Date/Time   WBC 11.2 (H) 10/30/2016 0538   RBC 4.40 10/30/2016 0538   HGB 13.8 10/30/2016 0538   HCT 40.1 10/30/2016 0538   PLT 234 10/30/2016 0538   MCV 91.1 10/30/2016 0538   MCH 31.4 10/30/2016 0538   MCHC 34.4 10/30/2016 0538   RDW 12.9 10/30/2016 0538   LYMPHSABS 1.2 10/23/2016 0523   MONOABS 0.8 10/23/2016 0523   EOSABS 0.0 10/23/2016 0523   BASOSABS 0.0 10/23/2016 0523    Studies/Results: No results found.  Medications: I have reviewed the patient's current medications.  Assessment/Plan:   1. Tetraparesis ---  CIR 2. DVT proph - cont w/SCD 3. Anxiety - on Xanax prn 4. Chronic pain -- on Gabapentin, Norco prn 5. CAD. No angina. Statin intolerant 6. Neurogenic bladder and bowel - cont w/a program  D/c IV   Length of stay, days: 17  Walker Kehr , MD 11/08/2016, 9:26 AM

## 2016-11-08 NOTE — Progress Notes (Signed)
Patient refusing liquid medications because they need to be thickened.

## 2016-11-08 NOTE — Progress Notes (Signed)
Occupational Therapy Session Note  Patient Details  Name: Joe [name] MRN: [id number] Date of Birth: 1946/05/27  Today's Date: 11/08/2016 OT Individual Time: 1345-1445 OT Individual Time Calculation (min): 60 min    Short Term Goals: Week 3:  OT Short Term Goal 1 (Week 3): STG=LTG due to LOS  Skilled Therapeutic Interventions/Progress Updates: Therapeutic activities with focus on generalized strengthening, discharge planning, patient ed on alterrnate AE for toileting (bidet use), and adherence precautions to dietary restrictions (dys 1 and dys 2 upgrade when appropriate).   Pt was received seated at EOB finishing his noon meal (mashed potatoes) and remained mildly agitated by frequent need for food service assist with correction to meals provided.  Patient required remotivation to proceed with POC after venting his frustrations relating, to dietary restrictions; OT also clarified plan for swallow study on 11/11/16 with plan to advance diet as per SLP recommendations and observations.   OT goals were reviewed with pt who appreciated discussion on use of bidet versus reach extending AE.    Printed examples of bidets provided for further inspection.   Pt was then escorted to ADL apartment to discuss discharge plans relating to food prep still required and then to rehab gym for endurance and generalized strengthening using NuStep and Sci-Fit for 20 minutes with rest breaks as needed.    Pt is aware of LE weakness and paresthesias and appreciates need for supervision for safety during strengthening activities but states he is prepared for imminent discharge.      Therapy Documentation Precautions:  Precautions Precautions: Fall Restrictions Weight Bearing Restrictions: No     Vital Signs: Therapy Vitals Temp: 98.7 F (37.1 C) Temp Source: Oral Pulse Rate: 90 Resp: 18 BP: 92/75 Patient Position (if appropriate): Lying Oxygen Therapy SpO2: 96 % O2 Device: Not Delivered   Pain:  No/denies pain  ADL: ADL ADL Comments: refer to functional navigator   See Function Navigator for Current Functional Status.   Therapy/Group: Individual Therapy  [place] 11/08/2016, 3:49 PM

## 2016-11-08 NOTE — Plan of Care (Signed)
Problem: SCI BOWEL ELIMINATION Goal: RH STG SCI MANAGE BOWEL WITH MEDICATION WITH ASSISTANCE STG SCI Manage bowel with medication with min assistance.   Outcome: Not Progressing Incontinence

## 2016-11-08 NOTE — Plan of Care (Signed)
Problem: SCI BOWEL ELIMINATION Goal: RH STG MANAGE BOWEL WITH ASSISTANCE STG Manage Bowel with min Assistance.   Outcome: Not Progressing Incontinence

## 2016-11-09 ENCOUNTER — Inpatient Hospital Stay (HOSPITAL_COMMUNITY): Payer: Medicare Other | Admitting: Occupational Therapy

## 2016-11-09 ENCOUNTER — Inpatient Hospital Stay (HOSPITAL_COMMUNITY): Payer: Medicare Other

## 2016-11-09 ENCOUNTER — Inpatient Hospital Stay (HOSPITAL_COMMUNITY): Payer: Medicare Other | Admitting: Speech Pathology

## 2016-11-09 ENCOUNTER — Inpatient Hospital Stay (HOSPITAL_COMMUNITY): Payer: Medicare Other | Admitting: Physical Therapy

## 2016-11-09 LAB — BASIC METABOLIC PANEL
Anion gap: 9 (ref 5–15)
BUN: 5 mg/dL — AB (ref 6–20)
CHLORIDE: 98 mmol/L — AB (ref 101–111)
CO2: 29 mmol/L (ref 22–32)
Calcium: 8.5 mg/dL — ABNORMAL LOW (ref 8.9–10.3)
Creatinine, Ser: 0.97 mg/dL (ref 0.61–1.24)
GFR calc Af Amer: >60 mL/min (ref >60)
GFR calc non Af Amer: >60 mL/min (ref >60)
GLUCOSE: 100 mg/dL — AB (ref 65–99)
POTASSIUM: 3.8 mmol/L (ref 3.5–5.1)
Sodium: 136 mmol/L (ref 135–145)

## 2016-11-09 NOTE — Progress Notes (Signed)
Occupational Therapy Note  Patient Details  Name: SHEAMUS HASTING MRN: [id number] Date of Birth: 11-21-1945  Today's Date: 11/09/2016 OT Individual Time: 1300-1355 OT Individual Time Calculation (min): 55 min   Pt denied pain Individual therapy  Pt resting in bed upon arrival.  Pt stated he was tired but willing to "give it a try" with therapy.  Pt stated he could only "do so much" before he unable to continue.  Pt initially engaged in functional amb with RW in room for simple home mgmt tasks (supervision).  Pt transitioned to therapy gym and engaged in BUE threrex on Scifit (level 3 X 4 mins X 3 sets). Pt returned to room and amb with RW to EOB. Pt remained at EOB with alarm activated and all needs within reach.    Leotis Shames [facility] 11/09/2016, 2:03 PM

## 2016-11-09 NOTE — Progress Notes (Signed)
Attempted to educate patient about self-cathing. Showed different types of home catheters. Patient able to verbally describe the process but unwilling to attempt. Refused toileting schedule.

## 2016-11-09 NOTE — Progress Notes (Signed)
Speech Language Pathology Daily Session Note  Patient Details  Name: Joe [name] MRN: [id number] Date of Birth: April 08, 1946  Today's Date: 11/09/2016   Session #1 SLP Individual Time: 1000-1030 SLP Individual Time Calculation (min): 30 min   Session #2 SLP Individual time: 1530-1600 SLP Individual Time Calculation (min): 30 min  Short Term Goals: Week 3: SLP Short Term Goal 1 (Week 3): Given supervision cues pt will use compensatory swallow strategies when consuming current diet to reduce aspiration risk.  SLP Short Term Goal 2 (Week 3): Pt will consume trials of ice chips and small sips of thin liquids without overt s/s of aspiration in preparation for further instrumental swallow study.  SLP Short Term Goal 3 (Week 3): Pt will consume nectar thick liquids via spoon without overt s/s of aspiration in preparation for further instrumental swallow study.  SLP Short Term Goal 4 (Week 3): Pt will consume trials of dysphagia 2 without overt s/s of aspiration in preparation for further instrumental swallow study.   Skilled Therapeutic Interventions:  Skilled treatment session #1 - focused on dysphagia goals. SLP facilitated session by skilled observation of pt consuming trials of nectar thick via spoon and cup sips. Pt with no overt s/s of aspiration. Pt consumed trials of dysphagia 2 textures without any overt s/s of aspiration at bedside. All questions answered to pt's satisfaction at this time. Modified Barium Swallow Study scheduled for 11/10/16. Pt was left sitting upright on edge of bed with all needs within reach. Continue per current plan of care.   Skilled treatment session #2 - focused on dysphagia goals. SLP facilitated session by providing skilled observation of pt consuming thin water via cup sip with head turn to left and right in preparation for Modified Barium Swallow Study on 11/10/16. With head turn to left, pt without overt s/s of aspiration (5 trials). With head turn to  right, pt with subtle wet voice and throat clear for 5 of 5 trials. Pt consumed nectar thick via cup and graham crackers without overt s/s of aspiration. Pt able to return demonstrate how to thicken liquids to nectar thick consistency. Pt was left upright on edge of bed and all needs within reach. Continue current plan of care.      Function:  Eating Eating   Modified Consistency Diet: Yes Eating Assist Level: More than reasonable amount of time   Eating Set Up Assist For: Opening containers       Cognition Comprehension Comprehension assist level: Understands complex 90% of the time/cues 10% of the time  Expression   Expression assist level: Expresses complex 90% of the time/cues < 10% of the time  Social Interaction Social Interaction assist level: Interacts appropriately 75 - 89% of the time - Needs redirection for appropriate language or to initiate interaction.  Problem Solving Problem solving assist level: Solves basic problems with no assist  Memory Memory assist level: More than reasonable amount of time    Pain    Therapy/Group: Individual Therapy   Hendricks Schwandt B. Rutherford Nail, M.S., CCC-SLP Speech-Language Pathologist   Ellwood Steidle 11/09/2016, 11:48 AM

## 2016-11-09 NOTE — Progress Notes (Signed)
Physical Therapy Session Note  Patient Details  Name: Joe [name] MRN: [id number] Date of Birth: 1946-02-07  Today's Date: 11/09/2016 PT Individual Time: 0900-1000 PT Individual Time Calculation (min): 60 min   Short Term Goals: Week 3:  PT Short Term Goal 1 (Week 3): =LTG due to estimated LOS  Skilled Therapeutic Interventions/Progress Updates: Pt received seated in w/c, denies pain but reports fatigue from earlier OT session; agreeable to treatment. W/c propulsion x100' with BUE for strengthening and endurance before fatigued. Stand pivot transfer w/c <>mat table with RW and S. Seated pipe tree for BUE [facility] and strengthening. 1x10 reps sit <>stand from mat table with S. Pt reports urgency to use restroom. Stand pivot to return to w/c with S and RW, transported [place] back to room. Stand pivot w/c <>BSC over toilet with S; totalA for managing hygiene and clothing. Stand pivot transfer to return to bed with S. Pt reports BP has been low; rechecked with standard size BP cuff appropriately fitting as opposed to bariatric cuff; BP 118/66 HR 85 O2 98%. Seated LE exercises 2x15 reps hip flexion marching, long arc quads. Remained seated on EOB at end of session, all needs in reach.      Therapy Documentation Precautions:  Precautions Precautions: Fall Restrictions Weight Bearing Restrictions: No   See Function Navigator for Current Functional Status.   Therapy/Group: Individual Therapy  Luberta Mutter 11/09/2016, 9:53 AM

## 2016-11-09 NOTE — Progress Notes (Signed)
Social Work Patient ID: Joe [name], male   DOB: 1946-07-29, 71 y.o.   MRN: [id number]   Have spoken with pt and two adult children (via phone) today to confirm family education tomorrow morning and to discuss further the concerns of b/b management at home.  Stressed to children that pt has been reluctant to learn I/O caths and that he continues to report that he believes his bladder will work once he is home and getting proper rest.  Nursing has continued to try to educate on self caths but pt continues to decline teaching.  Also, stressed that staff is trying to teach him how to manage his b/b as independently as possible, however, pt reports he will have someone "clean me up" at home.  Children plan to discuss further with pt and I will follow up with them in the morning.  Margues Filippini, LCSW

## 2016-11-09 NOTE — Progress Notes (Signed)
Occupational Therapy Session Note  Patient Details  Name: Joe [name] MRN: [id number] Date of Birth: 1945/12/30  Today's Date: 11/09/2016 OT Individual Time: 0730-0830 OT Individual Time Calculation (min): 60 min    Short Term Goals: Week 3:  OT Short Term Goal 1 (Week 3): STG=LTG due to LOS  Skilled Therapeutic Interventions/Progress Updates:    Pt seen for OT session focusing on ADL re-training. Pt sitting EOB upon arrival, cont with complaints regarding current diet and meal service options, attempt to re-direct. While pt finished breakfast, discussed d/c planning with scheduled family ed tomorrow with pt's daughter and plan for education to take place with all disciplines. Also discussed bowel/bladder program and pt cont with unrealistic expectations at d/c as he feels he will have total bowel/ bladder control once he returns to "natural environment and has piece of mind" Recommended pt attempt self cathing with assist of nursing though suspect pt will require assist due to decreased fine motor control and impaired sensation in UEs.  Pt refused bathing/dressing and standing grooming tasks this morning.  TED hose donned for LE edema management, sock aid used with min A for set-up and  He ambulated with min guard assist to sink and completed grooming tasks in sitting with increased time due to UE impairments.  Pt completed theraputty activity mod I while therapist retrieved elevating leg rests for edema management.  Per pt request, pt taken to therapy gym and completed 5 minutes on NuStep for general strengthening, completed 3 minutes on level 3, and 2 minutes on level 4.  Pt returned to room at end of session, left seated in w/c with all needs in reach.   Therapy Documentation Precautions:  Precautions Precautions: Fall Restrictions Weight Bearing Restrictions: No ADL: ADL ADL Comments: refer to functional navigator  See Function Navigator for Current Functional  Status.   Therapy/Group: Individual Therapy  Lewis, Wayman Hoard C 11/09/2016, 7:08 AM

## 2016-11-09 NOTE — Progress Notes (Signed)
Subjective/Complaints: Happy that he received "full bowls" of grits. Feels that he's getting "better". Bowel and bladder still inconsistent  ROS: pt denies nausea, vomiting, diarrhea, cough, shortness of breath or chest pain      Objective: Vital Signs: Blood pressure 126/86, pulse 82, temperature 98.3 F (36.8 C), temperature source Oral, resp. rate 18, weight 114.5 kg (252 lb 6.8 oz), SpO2 97 %. No results found. Results for orders placed or performed during the hospital encounter of 10/22/16 (from the past 72 hour(s))  Basic metabolic panel     Status: Abnormal   Collection Time: 11/09/16  3:18 AM  Result Value Ref Range   Sodium 136 135 - 145 mmol/L   Potassium 3.8 3.5 - 5.1 mmol/L   Chloride 98 (L) 101 - 111 mmol/L   CO2 29 22 - 32 mmol/L   Glucose, Bld 100 (H) 65 - 99 mg/dL   BUN 5 (L) 6 - 20 mg/dL   Creatinine, Ser 0.97 0.61 - 1.24 mg/dL   Calcium 8.5 (L) 8.9 - 10.3 mg/dL   GFR calc non Af Amer >60 >60 mL/min   GFR calc Af Amer >60 >60 mL/min    Comment: (NOTE) The eGFR has been calculated using the CKD EPI equation. This calculation has not been validated in all clinical situations. eGFR's persistently <60 mL/min signify possible Chronic Kidney Disease.    Anion gap 9 5 - 15     HEENT: normocephalic, atraumatic. Lips dry Cardio: RRR Resp:  Clear, normal effort GI: BS+, NT, ND Skin:  No skin breakdown on heels Neuro: Alert/Oriented Decreased LT/Pain especially in hands/feet--improving Motor 5/5 bilateral delt , bi, tri , grip, HF, KE, ADF --  Musc/Skel:      Uro: condom cath on Psych: appropriate.  Gen NAD.      Assessment/Plan: 1. 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 Physiatrist is providing close team supervision and 24 hour management of active medical problems listed below. Physiatrist and rehab team continue to assess barriers to  discharge/monitor patient progress toward functional and medical goals. FIM: Function - Bathing Bathing activity did not occur: (P) Refused Position: Shower Body parts bathed by patient: Right arm, Left arm, Chest, Abdomen, Front perineal area, Right upper leg, Left upper leg, Right lower leg, Left lower leg Body parts bathed by helper: Buttocks, Back Assist Level: Touching or steadying assistance(Pt > 75%)  Function- Upper Body Dressing/Undressing Upper body dressing/undressing activity did not occur: (P) Refused What is the patient wearing?: Pull over shirt/dress, Hospital gown Pull over shirt/dress - Perfomed by patient: Thread/unthread right sleeve, Thread/unthread left sleeve, Put head through opening, Pull shirt over trunk Pull over shirt/dress - Perfomed by helper: Put head through opening Assist Level: Set up Set up : To obtain clothing/put away Function - Lower Body Dressing/Undressing What is the patient wearing?: (P) Ted Hose, Non-skid slipper socks Position: (P) Sitting EOB Pants- Performed by patient: Thread/unthread right pants leg, Thread/unthread left pants leg, Pull pants up/down Pants- Performed by helper: Pull pants up/down Non-skid slipper socks- Performed by patient: (P) Don/doff right sock, Don/doff left sock (Sock aid) Non-skid slipper socks- Performed by helper: Don/doff right sock, Don/doff left sock Shoes - Performed by patient: Don/doff right shoe, Don/doff left shoe Shoes - Performed by helper: Don/doff right shoe, Don/doff left shoe TED Hose - Performed by helper: (P) Don/doff right TED hose, Don/doff left TED hose Assist for footwear: (P) Supervision/touching assist  Assist for lower body dressing: Touching or steadying assistance (Pt > 75%)  Function - Toileting Toileting activity did not occur: No continent bowel/bladder event Toileting steps completed by patient: Adjust clothing prior to toileting Toileting steps completed by helper: Adjust clothing  prior to toileting, Performs perineal hygiene, Adjust clothing after toileting Toileting Assistive Devices: Grab bar or rail Assist level: Touching or steadying assistance (Pt.75%)  Function - Air cabin crew transfer activity did not occur: Safety/medical concerns Toilet transfer assistive device: Elevated toilet seat/BSC over toilet Assist level to toilet: Moderate assist (Pt 50 - 74%/lift or lower) Assist level from toilet: Moderate assist (Pt 50 - 74%/lift or lower)  Function - Chair/bed transfer Chair/bed transfer method: Stand pivot Chair/bed transfer assist level: Touching or steadying assistance (Pt > 75%) Chair/bed transfer assistive device: Armrests, Walker Chair/bed transfer details: Verbal cues for sequencing, Verbal cues for precautions/safety, Verbal cues for safe use of DME/AE, Tactile cues for placement  Function - Locomotion: Wheelchair Type: Manual Max wheelchair distance: 70 Assist Level: Supervision or verbal cues Assist Level: Supervision or verbal cues Wheel 150 feet activity did not occur: Safety/medical concerns (fatigue) Turns around,maneuvers to table,bed, and toilet,negotiates 3% grade,maneuvers on rugs and over doorsills: No Function - Locomotion: Ambulation Assistive device: Walker-rolling Max distance: 160 Assist level: Touching or steadying assistance (Pt > 75%) Assist level: Touching or steadying assistance (Pt > 75%) Walk 50 feet with 2 turns activity did not occur: Safety/medical concerns Assist level: Touching or steadying assistance (Pt > 75%) Walk 150 feet activity did not occur: Safety/medical concerns Assist level: Touching or steadying assistance (Pt > 75%) Walk 10 feet on uneven surfaces activity did not occur: Safety/medical concerns  Function - Comprehension Comprehension: Auditory Comprehension assist level: Understands complex 90% of the time/cues 10% of the time  Function - Expression Expression: Verbal Expression assist  level: Expresses complex 90% of the time/cues < 10% of the time  Function - Social Interaction Social Interaction assist level: Interacts appropriately 75 - 89% of the time - Needs redirection for appropriate language or to initiate interaction.  Function - Problem Solving Problem solving assist level: Solves basic problems with no assist  Function - Memory Memory assist level: More than reasonable amount of time Patient normally able to recall (first 3 days only): Current season, Location of own room, Staff names and faces, That he or she is in a hospital  Medical Problem List and Plan: 1. 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 DVT Prophylaxis/Anticoagulation: SCDs.   Vascular study neg for DVT. 3. Pain Management: Neurontin--continue at 329m tid  -hydrocodone as needed  -fair control 4. Mood: Provide emotional support  -continued anxiety  -xanax prn  -weaning off decadron has helped somewhat  5. Neuropsych: This patient iscapable of making decisions on hisown behalf. 6. Skin/Wound Care: Routine skin checks 7. Fluids/Electrolytes/Nutrition: -labs ok  -I personally reviewed the patient's labs today.   8.Dysphagia.  .   -improving voice and cough. On water protocol  -MBS yesterday---upgraded to D1/nectars  -dv IVF 9.CAD status post CABG. No chest pain or shortness of breath. Discusswith neurosurgery onresuming aspirin 81 mg daily 10.Hypertension. Cozaar 100 mg daily, Lasix 20 mg twice a day. Monitor with increased mobility Vitals:   11/08/16 1512 11/09/16 0531  BP: 92/75 126/86  Pulse: 90 82  Resp: 18 18  Temp: 98.7 F (37.1 C) 98.3 F (36.8 C)   Control improved 11.MRSA  PCR screening positive. Contact precautions 12.Sinusitis.Unasyn changed to augmentin (duration?)---see no further signs, afebrile  -off abx  -wbc's 11.2   12.Hyponatremia.  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 Hemorrhoids--improved   -Tucks    - TID sitz bath ---changed to prn  .   LOS (Days) 18 A FACE TO FACE EVALUATION WAS PERFORMED  Joe [name] 11/09/2016, 8:41 AM

## 2016-11-10 ENCOUNTER — Inpatient Hospital Stay (HOSPITAL_COMMUNITY): Payer: Medicare Other

## 2016-11-10 ENCOUNTER — Inpatient Hospital Stay (HOSPITAL_COMMUNITY): Payer: Medicare Other | Admitting: Occupational Therapy

## 2016-11-10 ENCOUNTER — Encounter (HOSPITAL_COMMUNITY): Payer: Medicare Other | Admitting: Occupational Therapy

## 2016-11-10 ENCOUNTER — Inpatient Hospital Stay (HOSPITAL_COMMUNITY): Payer: Medicare Other | Admitting: Speech Pathology

## 2016-11-10 LAB — BASIC METABOLIC PANEL
ANION GAP: 9 (ref 5–15)
BUN: 9 mg/dL (ref 6–20)
CO2: 30 mmol/L (ref 22–32)
Calcium: 8.7 mg/dL — ABNORMAL LOW (ref 8.9–10.3)
Chloride: 96 mmol/L — ABNORMAL LOW (ref 101–111)
Creatinine, Ser: 1.11 mg/dL (ref 0.61–1.24)
GFR calc Af Amer: >60 mL/min (ref >60)
GLUCOSE: 104 mg/dL — AB (ref 65–99)
POTASSIUM: 4.3 mmol/L (ref 3.5–5.1)
Sodium: 135 mmol/L (ref 135–145)

## 2016-11-10 LAB — GLUCOSE, CAPILLARY: Glucose-Capillary: 105 mg/dL — ABNORMAL HIGH (ref 65–99)

## 2016-11-10 MED ORDER — ENSURE ENLIVE PO LIQD: 237.0000 mL | Freq: Two times a day (BID) | ORAL | Status: DC

## 2016-11-10 NOTE — Progress Notes (Signed)
Occupational Therapy Discharge Summary  Patient Details  Name: Joe [name] MRN: [id number] Date of Birth: 06-06-1946   Patient has met 48 of 11 long term goals due to improved activity tolerance, improved balance, postural control, ability to compensate for deficits, functional use of  RIGHT upper and LEFT upper extremity and improved coordination.  Patient to discharge at overall Supervision level with assist required for toileting.  Patient's care partner is independent to provide the necessary physical assistance at discharge.   Completed family education with pt's daughter and son. Recommending 24 hour supervision assistance. Have educated extensively regarding bowel/ bladder program which pt is non-compliant with. Recommending timed toileting to establish bowel/bladder regimen. Pt requires assist for toileting tasks, unreceptive to AE for increased independence.  Pt most limited by decreased sensation in B hands limiting functional independence.   Pt with decreased participation in therapy, requiring increased assist for ADL tasks, however, has demonstrated to be min A overall for toileting/dressing tasks.   Recommendation:  Patient will benefit from ongoing skilled OT services in home health setting to continue to advance functional skills in the area of BADL, iADL and Reduce care partner burden.  Equipment: tub transfer bench and BSC  Reasons for discharge: treatment goals met and discharge from hospital  Patient/family agrees with progress made and goals achieved: Yes  OT Discharge Precautions/Restrictions  Precautions Precautions: Fall Restrictions Weight Bearing Restrictions: No ADL ADL ADL Comments: refer to functional navigator Vision/Perception  Vision- History Baseline Vision/History: No visual deficits Patient Visual Report: No change from baseline Vision- Assessment Vision Assessment?: No apparent visual deficits  Cognition Overall Cognitive Status: Within  Functional Limits for tasks assessed Sensation Sensation Light Touch: Impaired Detail Light Touch Impaired Details: Impaired RUE;Impaired LUE;Impaired RLE;Impaired LLE Proprioception: Impaired Detail Proprioception Impaired Details: Impaired RUE;Impaired LUE;Impaired RLE;Impaired LLE Coordination Gross Motor Movements are Fluid and Coordinated: No Fine Motor Movements are Fluid and Coordinated: No Heel Shin Test: impaired, limited ROM and slow speed 9 Hole Peg Test: L 1 min 38 sec, R 1 min 49 sec Motor  Motor Motor: Ataxia Motor - Discharge Observations: tetraparesis UEs more affected than LEs Mobility  Bed Mobility Bed Mobility: Supine to Sit;Rolling Right;Rolling Left Rolling Right: 6: Modified independent (Device/Increase time) Rolling Left: 6: Modified independent (Device/Increase time) Supine to Sit: 6: Modified independent (Device/Increase time) Transfers Sit to Stand: 6: Modified independent (Device/Increase time) Stand to Sit: 6: Modified independent (Device/Increase time)  Trunk/Postural Assessment  Cervical Assessment Cervical Assessment: Exceptions to [facility] (limited ROM throughout) Thoracic Assessment Thoracic Assessment: Exceptions to [facility] (increased kyphosis, rounded shoulders) Lumbar Assessment Lumbar Assessment: Exceptions to WFL (decreased lordosis) Postural Control Postural Control: Deficits on evaluation (delayed stepping/righting reactions)  Balance Balance Balance Assessed: Yes Static Sitting Balance Static Sitting - Level of Assistance: 7: Independent Dynamic Sitting Balance Dynamic Sitting - Level of Assistance: 7: Independent Static Standing Balance Static Standing - Level of Assistance: 6: Modified independent (Device/Increase time) Dynamic Standing Balance Dynamic Standing - Level of Assistance: 5: Stand by assistance Extremity/Trunk Assessment RUE Assessment RUE Assessment: Exceptions to [facility] RUE AROM (degrees) RUE Overall AROM Comments: ROM  WFL RUE Strength RUE Overall Strength: Deficits RUE Overall Strength Comments: Strength 4/5 thorughout, impaired sensation in B hands LUE Assessment LUE Assessment: Exceptions to WFL LUE AROM (degrees) Overall AROM Left Upper Extremity: Within functional limits for tasks assessed LUE Strength LUE Overall Strength Comments: 4/5 throughout; impaired sensation in B hands   See Function Navigator for Current Functional Status.  [name], [name] C 11/10/2016,  3:55 PM

## 2016-11-10 NOTE — Progress Notes (Signed)
Occupational Therapy Session Note  Patient Details  Name: Joe [name] MRN: [id number] Date of Birth: 12-Oct-1945  Today's Date: 11/10/2016 OT Individual Time: 1000-1100 OT Individual Time Calculation (min): 60 min    Short Term Goals: Week 3:  OT Short Term Goal 1 (Week 3): STG=LTG due to LOS  Skilled Therapeutic Interventions/Progress Updates:    Pt seen for OT session focusing on family education with pt's daughter and son. Pt sitting EOB upon arrival with family members present, agreeable to "family ed only" and requiring encouragement for participation following education regarding need for family ed. He voiced need for toileting task, ambulated into bathroom with close supervision. With encouragement, pt's daughter assist for hygiene as pt able to complete thoroughly.  Educated extensively regarding pt's current bowel program and recommendation for timed toileting as well as adherence to bowel program and medications.  In ADL apartment, pt completed simulated tub/shower transfer utilizing tub transfer bench. Completed with supervision following demonstration. Demonstration provided to caregivers regarding proper set-up of bench. Recommend taking shower doors down and replacing with shower curtain. Therapist demonstrated technique for using RW to enter shower stall, however, due to small shower and set-up of pt's home walk in shower, recommend using tub/shower at home for increased safety. Explained use of 3-1 BSC as shower chair if they decide to use shower stall.  Pt then completed x3 6 inch steps using B rails in simulation of home entry.  He completed x4 minutes on NuStep 3 minutes at level 3, 1 minute at level 4. He then ceased activity and stated he was ready to return to room and was done with therapy. Supervision stand pivot completed to bed. Pt left seated EOB at end of session, all needs in reach.  Educated throughout session regarding recommendation for 24 hour supervision,  pt's current deficits and level of assist, SCI, follow up therapy, rate of return, DME, and d/c planning. Pt's family voiced understanding, though seemed hesitant of ability to provide 24/7 care despite recommendation for this having been made since admission.   Therapy Documentation Precautions:  Precautions Precautions: Fall Restrictions Weight Bearing Restrictions: No ADL: ADL ADL Comments: refer to functional navigator  See Function Navigator for Current Functional Status.   Therapy/Group: Individual Therapy  Lewis, Daleena Rotter C 11/10/2016, 7:13 AM

## 2016-11-10 NOTE — Discharge Summary (Signed)
Discharge summary job [phone]

## 2016-11-10 NOTE — Plan of Care (Signed)
Problem: SCI BOWEL ELIMINATION Goal: RH STG MANAGE BOWEL WITH ASSISTANCE STG Manage Bowel with min Assistance.   Outcome: Not Progressing Refusing bowel program; incontinent in brief  Problem: SCI BLADDER ELIMINATION Goal: RH STG MANAGE BLADDER WITH ASSISTANCE STG Manage Bladder With Min Assistance   Outcome: Not Progressing Refuses self catheterization; incontinent

## 2016-11-10 NOTE — Progress Notes (Signed)
Physical Therapy Discharge Summary  Patient Details  Name: Joe [name] MRN: [id number] Date of Birth: 06/04/1946  Today's Date: 11/10/2016 PT Individual Time: 1100-1200 PT Individual Time Calculation (min): 60 min    Patient has met 10 of 10 long term goals due to improved activity tolerance, improved balance, improved postural control, increased strength, ability to compensate for deficits, functional use of  right upper extremity, right lower extremity, left upper extremity and left lower extremity, improved awareness and improved coordination.  Patient to discharge at an ambulatory level Supervision.   Patient's care partner is independent to provide the necessary physical and cognitive assistance at discharge.  Reasons goals not met: All goals met  Recommendation:  Patient will benefit from ongoing skilled PT services in home health setting to continue to advance safe functional mobility, address ongoing impairments in strength, activity tolerance, balance, and minimize fall risk.  Equipment: No equipment provided  Reasons for discharge: treatment goals met and discharge from hospital  Patient/family agrees with progress made and goals achieved: Yes  PT Discharge Precautions/Restrictions Restrictions Weight Bearing Restrictions: No  Pain Pain Assessment Pain Assessment: 0-10 Pain Score: 3  Pain Type: Acute pain Pain Location: Back Pain Orientation: Lower Pain Descriptors / Indicators: Aching Pain Onset: On-going Patients Stated Pain Goal: 3 Pain Intervention(s): Medication (See eMAR) Vision/Perception   WFL; no change from baseline  Cognition Orientation Level: Oriented X4 Sensation Sensation Light Touch: Impaired Detail Light Touch Impaired Details: Impaired RUE;Impaired LUE;Impaired RLE;Impaired LLE Proprioception: Impaired Detail Proprioception Impaired Details: Impaired RUE;Impaired LUE;Impaired RLE;Impaired LLE Coordination Gross Motor Movements are  Fluid and Coordinated: No Fine Motor Movements are Fluid and Coordinated: No Heel Shin Test: impaired, limited ROM and slow speed 9 Hole Peg Test: L 1 min 38 sec, R 1 min 49 sec Motor  Motor Motor: Ataxia Motor - Discharge Observations: tetraparesis UEs more affected than LEs  Mobility Bed Mobility Bed Mobility: Supine to Sit;Rolling Right;Rolling Left Rolling Right: 6: Modified independent (Device/Increase time) Rolling Left: 6: Modified independent (Device/Increase time) Supine to Sit: 6: Modified independent (Device/Increase time) Transfers Transfers: Yes Sit to Stand: 6: Modified independent (Device/Increase time) Stand to Sit: 6: Modified independent (Device/Increase time) Stand Pivot Transfers: 6: Modified independent (Device/Increase time) Locomotion  Ambulation Ambulation: Yes Ambulation/Gait Assistance: 5: Supervision Ambulation Distance (Feet): 80 Feet (ambulated up to 150' while in rehab, limited on grad day d/t fatigue) Assistive device: Rolling walker Ambulation/Gait Assistance Details: Verbal cues for safe use of DME/AE;Verbal cues for precautions/safety Gait Gait: Yes Gait Pattern: Impaired Gait Pattern: Poor foot clearance - left;Poor foot clearance - right;Narrow base of support;Left circumduction;Right circumduction Gait velocity: decreased Stairs / Additional Locomotion Stairs: Yes Stairs Assistance: 5: Supervision Stairs Assistance Details: Verbal cues for precautions/safety Stair Management Technique: Two rails;Forwards;Alternating pattern Ramp: 5: Supervision Curb: 4: Min Chemical engineer: Yes Wheelchair Assistance: 5: Investment banker, operational Details: Verbal cues for Marketing executive: Both upper extremities Wheelchair Parts Management: Needs assistance Distance: 100  Trunk/Postural Assessment  Cervical Assessment Cervical Assessment: Exceptions to [facility] (limited ROM throughout) Thoracic  Assessment Thoracic Assessment: Exceptions to [facility] (increased kyphosis, rounded shoulders) Lumbar Assessment Lumbar Assessment: Exceptions to WFL (decreased lordosis) Postural Control Postural Control: Deficits on evaluation (delayed stepping/righting reactions)  Balance Balance Balance Assessed: Yes Static Sitting Balance Static Sitting - Level of Assistance: 7: Independent Dynamic Sitting Balance Dynamic Sitting - Level of Assistance: 7: Independent Static Standing Balance Static Standing - Level of Assistance: 6: Modified independent (Device/Increase time) Dynamic Standing Balance Dynamic  Standing - Level of Assistance: 5: Stand by assistance Extremity Assessment  RUE Assessment RUE Assessment: Exceptions to [facility] RUE AROM (degrees) RUE Overall AROM Comments: ROM WFL RUE Strength RUE Overall Strength: Deficits RUE Overall Strength Comments: Strength 4/5 thorughout, impaired sensation in B hands LUE Assessment LUE Assessment: Exceptions to WFL LUE AROM (degrees) Overall AROM Left Upper Extremity: Within functional limits for tasks assessed LUE Strength LUE Overall Strength Comments: 4/5 throughout; impaired sensation in B hands RLE Assessment RLE Assessment: Within Functional Limits (4+/5 throughout) LLE Assessment LLE Assessment: Within Functional Limits (4+/5 throughout)  Skilled Therapeutic Intervention: Pt received seated on edge of bed, denies pain and agreeable to treatment but does report he feels nauseous. Family present at beginning of session following OT family ed, however had to leave to return to work. Discussed recommendation that pt have 24/7 S for mobility tasks, use of RW, and assist with toileting. Gait x80' x2 trials with RW and S overall, limited by fatigue. Performed car transfer with S, gait over uneven surface with min guard and RW. UE ergometer x8 min total, several short rest breaks due to fatigue. 9 hole peg test performed L 1 min 28 sec, R 1 min 49 sec.  Returned to room totalA for energy conservation. Gait to return to bed with RW and S x15'. Remained seated EOB at end of session, all needs in reach. Pt with no further questions/concerns regarding d/c home at S level with RW.   See Function Navigator for Current Functional Status.  Benjiman Core Tygielski 11/10/2016, 12:10 PM

## 2016-11-10 NOTE — Progress Notes (Signed)
Nutrition Follow-up  DOCUMENTATION CODES:   Obesity unspecified  INTERVENTION:  Provide Ensure Enlive po BID, each supplement provides 350 kcal and 20 grams of protein.  Discontinue Prostat.  Encourage adequate PO intake.   NUTRITION DIAGNOSIS:   Increased nutrient needs related to  (therapy) as evidenced by estimated needs; ongoing  GOAL:   Patient will meet greater than or equal to 90% of their needs; progressed  MONITOR:   Supplement acceptance, PO intake, Labs, Weight trends, Skin, I & O's  REASON FOR ASSESSMENT:   Consult Calorie Count  ASSESSMENT:   71 y.o. right handed male with history of tremor with vertigo followed by Dr. Sabra Heck, CAD status post CABG as well as recent anterior cervical decompression C3-C4 discectomy and fusion 10/09/2016  Presented 10/17/2016 with increasing weakness and difficulty swallowing. MRI C-spine reviewed, showing fluid collection around spinal cord. A nasogastric tube was placed for nutritional supportThat patient pulled out and also recently started on diet of dysphagia 1 pudding-thick liquids.  MBS done this AM. Diet has been advanced to a dysphagia 3 diet with thin liquids. Meal completion 75% at lunch today. RD to order Ensure to aid in caloric and protein needs. Will discontinue Prostat as pt has been refusing them. Recommend continuation of nutritional supplements post discharge to aid in adequate nutrition.  Labs and medications reviewed.    Diet Order:  DIET DYS 3 Room service appropriate? Yes; Fluid consistency: Thin  Skin:   (Incision on neck)  Last BM:  3/19  Height:   Ht Readings from Last 1 Encounters:  10/17/16 5\' 9"  (1.753 m)    Weight:   Wt Readings from Last 1 Encounters:  11/04/16 252 lb 6.8 oz (114.5 kg)    Ideal Body Weight:  72.7 kg  BMI:  Body mass index is 37.28 kg/m.  Estimated Nutritional Needs:   Kcal:  2100-2300  Protein:  110-130 grams  Fluid:  2.1 - 2.3 L/day  EDUCATION NEEDS:   No  education needs identified at this time  Corrin Parker, MS, RD, LDN Pager # [phone] After hours/ weekend pager # [phone]

## 2016-11-10 NOTE — Discharge Summary (Signed)
Joe [name], Joe [name] NO.:  [id number]  MEDICAL RECORD NO.:  [id number]  LOCATION:  [id number]                        FACILITY:  [facility]  PHYSICIAN:  Meredith Staggers, M.D.DATE OF BIRTH:  May 20, 1946  DATE OF ADMISSION:  10/22/2016 DATE OF DISCHARGE:  11/11/2016                              DISCHARGE SUMMARY   DISCHARGE DIAGNOSES: 1. Cervical myelopathy, status post anterior cervical discectomy and     fusion with tetraparesis. 2. Sequential compression devices for deep vein thrombosis     prophylaxis. 3. Pain management. 4. Constipation. 5. Dysphagia. 6. Coronary artery disease with coronary artery bypass grafting. 7. Hypertension. 8. Methicillin resistant Staphylococcus aureus PCR screening positive. 9. Hyponatremia, resolved. 10.Neurogenic bladder. 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 12.Hemorrhoids.  HISTORY OF PRESENT ILLNESS:  This is a 71 year old right-handed male with history of tremor, CAD with CABG, recent anterior cervical decompression C3-C4 diskectomy fusion October 09, 2016, per Dr. Ellene Route, discharge to home, ambulating with supervision.  He lives alone, one- level home.  Presented October 17, 2016, with increasing weakness, difficulty swallowing.  MRI of cervical spine showed fluid collection around spinal cord with rim enhancement, worrisome for postoperative abscess or hematoma.  There was fluid in the disk space and small amount of fluid in the ventral epidural space.  A nasogastric tube was placed for nutritional support, later placed on a dysphagia #1 pudding thick liquid and later advanced to mechanical soft with thin liquids.  Neurosurgery follow up.  No plan for surgical intervention. Maintained on Decadron protocol.  MRSA PCR screening positive, placed on contact precautions.  Mild hyponatremia of 131 and monitored.  Physical and occupational therapy ongoing.  The patient was admitted for a comprehensive rehab program.  PAST MEDICAL HISTORY:  See  discharge diagnoses.  SOCIAL HISTORY:  Lives alone.  He has a friend that checks on him intermittently.  Functional status upon admission to rehab services was moderate assist, ambulate 10 feet rolling walker, minimal assist stand pivot transfers, max total assist activities of daily living.  PHYSICAL EXAMINATION:  VITAL SIGNS:  Blood pressure 133/66, pulse 63, temperature 97, respirations 20. GENERAL:  This was an alert male, in no acute distress. HEENT:  Pupils round and reactive to light. NECK:  Supple.  Nontender.  No JVD. CARDIAC:  Regular rate and rhythm.  No murmur. ABDOMEN:  Soft, nontender.  Good bowel sounds. LUNGS:  Clear to auscultation.  No wheeze. NEUROLOGIC:  Motor strength, bilateral upper 4/5 deltoid, biceps, triceps, and grip.  Bilateral lower 4+/5, hip flexors, knee extension, ankle dorsiflexion, and ankle plantar flexion.  REHABILITATION HOSPITAL COURSE:  The patient was admitted to inpatient rehab services with therapies initiated on a 3-hour daily basis, consisting of physical therapy, occupational therapy, speech therapy, and rehabilitation nursing.  The following issues were addressed during the patient's rehabilitation stay.  Pertaining to [name]' cervical myelopathy, he had undergone ACDF October 09, 2016, followed by Dr. Ellene Route.  Ongoing conservative care is recommended.  SCDs for DVT prophylaxis.  Vascular studies negative.  Pain management with the use of Neurontin, which was increased to 300 mg 3 times daily and hydrocodone for breakthrough pain.  He was using Xanax on a limited basis for bouts of  anxiety.  He completed the Decadron protocol.  Mild hyponatremia had resolved, latest sodium levels of 136.  He still continued on a dysphagia #1 nectar thick liquid diet, monitoring of hydration.  No chest pain or shortness of breath.  History of CAD with CABG.  His low-dose aspirin had been resumed.  Blood pressure is controlled on Cozaar as well as  Norvasc.  He remained on contact precautions for history of MRSA PCR screening positive.  Neurogenic bowel and bladder, working with timed voids with catheterizations as needed to keep volumes between 300 and 550 however patient later refused in and out catheterization and a Foley catheter tube was placed.  Neurogenic bowel, emptying bowel much improved.  He continued on bowel program with full education provided.  The patient received weekly collaborative interdisciplinary team conferences to discuss estimated length of stay, family teaching, any barriers to discharge.  He could propel his wheelchair to his therapy sessions, stand pivot transfers, wheelchair to mat rolling walker supervision.  Sit to stand from mat table with supervision. Stand pivot to return to wheelchair with supervision rolling walker. Wheelchair to bedside commode over toilet with supervision needing assistance for managing hygiene and clothing.  Stand pivot transfers to return to bed with supervision.  Working with energy conservation techniques.  Activities of daily living and homemaking.  He worked with the ADL apartment for food preparation still requiring very little assistance.  Again, energy conservation noted with occupational therapy. Ongoing education was working with his daughter as well as with bowel and bladder program.  The patient did refuse bathing and dressing at times and needed some encouragement.  He would ambulate with minimal guard assist to the sink to complete his grooming tasks.  Overall, strength and endurance continued to improve and the patient was encouraged overall progress and plan discharge to home.  DISCHARGE MEDICATIONS:  Included Norvasc 5 mg p.o. daily, aspirin 81 mg p.o. daily, Dulcolax suppository daily, vitamin D 1000 units p.o. daily, Lasix 20 mg p.o. b.i.d., Neurontin 300 mg p.o. t.i.d., Cozaar 100 mg p.o. daily, vitamin B6 100 mg p.o. daily, Senokot-S tablets 1 p.o.  at bedtime, Carafate 1 g p.o. t.i.d., vitamin B12 1500 mcg p.o. daily, Xanax 0.25 mg p.o. t.i.d. as needed, hydrocodone 1-2 tablets every 4 hours as needed pain.  DISCHARGE INSTRUCTIONS:  His diet was currently a dysphagia #3 thin liquid diet, advanced as per speech therapy.  He would follow up Dr. Alger Simons at the outpatient rehab service office as directed; Dr. Kristeen Miss call for appointment; Dr. Lew Dawes, medical management.  The patient will continue with current bowel and bladder program as recommended.     Lauraine Rinne, P.A.   ______________________________ Meredith Staggers, M.D.    DA/MEDQ  D:  11/10/2016  T:  11/10/2016  Job:  [id number]  cc:   Meredith Staggers, M.D. Earleen Newport, M.D. Evie Lacks. Plotnikov, MD

## 2016-11-10 NOTE — Progress Notes (Signed)
Subjective/Complaints: Sitting in bed eating breakfast. Had 4 incontinent urinations over night. No caths yesterday.  ROS: pt denies nausea, vomiting, diarrhea, cough, shortness of breath or chest pain      Objective: Vital Signs: Blood pressure (!) 110/52, pulse 64, temperature 99.1 F (37.3 C), temperature source Oral, resp. rate 18, weight 114.5 kg (252 lb 6.8 oz), SpO2 96 %. No results found. Results for orders placed or performed during the hospital encounter of 10/22/16 (from the past 72 hour(s))  Basic metabolic panel     Status: Abnormal   Collection Time: 11/09/16  3:18 AM  Result Value Ref Range   Sodium 136 135 - 145 mmol/L   Potassium 3.8 3.5 - 5.1 mmol/L   Chloride 98 (L) 101 - 111 mmol/L   CO2 29 22 - 32 mmol/L   Glucose, Bld 100 (H) 65 - 99 mg/dL   BUN 5 (L) 6 - 20 mg/dL   Creatinine, Ser 0.97 0.61 - 1.24 mg/dL   Calcium 8.5 (L) 8.9 - 10.3 mg/dL   GFR calc non Af Amer >60 >60 mL/min   GFR calc Af Amer >60 >60 mL/min    Comment: (NOTE) The eGFR has been calculated using the CKD EPI equation. This calculation has not been validated in all clinical situations. eGFR's persistently <60 mL/min signify possible Chronic Kidney Disease.    Anion gap 9 5 - 15     HEENT: normocephalic, atraumatic. Lips dry Cardio: RRR Resp:  Clear, normal effort GI: BS+, NT/ND Skin:  No skin breakdown on heels Neuro: Alert/Oriented Decreased LT/Pain especially in hands/feet--improving Motor 5/5 bilateral delt , bi, tri , grip, HF, KE, ADF---stable Musc/Skel:      Uro: no change Psych: appropriate.  Gen NAD.      Assessment/Plan: 1. 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 Physiatrist is providing close team supervision and 24 hour management of active medical problems listed below. Physiatrist and rehab team continue to assess barriers to discharge/monitor  patient progress toward functional and medical goals. FIM: Function - Bathing Bathing activity did not occur: Refused Position: Shower Body parts bathed by patient: Right arm, Left arm, Chest, Abdomen, Front perineal area, Right upper leg, Left upper leg, Right lower leg, Left lower leg Body parts bathed by helper: Buttocks, Back Assist Level: Touching or steadying assistance(Pt > 75%)  Function- Upper Body Dressing/Undressing Upper body dressing/undressing activity did not occur: Refused What is the patient wearing?: Pull over shirt/dress, Hospital gown Pull over shirt/dress - Perfomed by patient: Thread/unthread right sleeve, Thread/unthread left sleeve, Put head through opening, Pull shirt over trunk Pull over shirt/dress - Perfomed by helper: Put head through opening Assist Level: Set up Set up : To obtain clothing/put away Function - Lower Body Dressing/Undressing What is the patient wearing?: Ted Hose, Non-skid slipper socks Position: Sitting EOB Pants- Performed by patient: Thread/unthread right pants leg, Thread/unthread left pants leg, Pull pants up/down Pants- Performed by helper: Pull pants up/down Non-skid slipper socks- Performed by patient: Don/doff right sock, Don/doff left sock (Sock aid) Non-skid slipper socks- Performed by helper: Don/doff right sock, Don/doff left sock Shoes - Performed by patient: Don/doff right shoe, Don/doff left shoe Shoes - Performed by helper: Don/doff right shoe, Don/doff left shoe TED Hose - Performed by helper: Don/doff right TED hose, Don/doff left TED hose Assist for footwear: Supervision/touching assist Assist for lower body dressing: Touching or steadying assistance (Pt > 75%)  Function -  Toileting Toileting activity did not occur: No continent bowel/bladder event Toileting steps completed by patient: Adjust clothing prior to toileting Toileting steps completed by helper: Adjust clothing prior to toileting, Performs perineal hygiene,  Adjust clothing after toileting Toileting Assistive Devices: Grab bar or rail Assist level: Supervision or verbal cues  Function - Air cabin crew transfer activity did not occur: Safety/medical concerns Toilet transfer assistive device: Elevated toilet seat/BSC over toilet, Grab bar Assist level to toilet: Supervision or verbal cues Assist level from toilet: Supervision or verbal cues  Function - Chair/bed transfer Chair/bed transfer method: Stand pivot Chair/bed transfer assist level: Supervision or verbal cues Chair/bed transfer assistive device: Armrests, Walker Chair/bed transfer details: Verbal cues for safe use of DME/AE, Verbal cues for precautions/safety  Function - Locomotion: Wheelchair Type: Manual Max wheelchair distance: 100 Assist Level: Supervision or verbal cues Assist Level: Supervision or verbal cues Wheel 150 feet activity did not occur: Safety/medical concerns (fatigue) Turns around,maneuvers to table,bed, and toilet,negotiates 3% grade,maneuvers on rugs and over doorsills: No Function - Locomotion: Ambulation Assistive device: Walker-rolling Max distance: 160 Assist level: Touching or steadying assistance (Pt > 75%) Assist level: Touching or steadying assistance (Pt > 75%) Walk 50 feet with 2 turns activity did not occur: Safety/medical concerns Assist level: Touching or steadying assistance (Pt > 75%) Walk 150 feet activity did not occur: Safety/medical concerns Assist level: Touching or steadying assistance (Pt > 75%) Walk 10 feet on uneven surfaces activity did not occur: Safety/medical concerns  Function - Comprehension Comprehension: Auditory Comprehension assist level: Understands basic 90% of the time/cues < 10% of the time  Function - Expression Expression: Verbal Expression assist level: Expresses basic 90% of the time/requires cueing < 10% of the time.  Function - Social Interaction Social Interaction assist level: Interacts  appropriately 75 - 89% of the time - Needs redirection for appropriate language or to initiate interaction.  Function - Problem Solving Problem solving assist level: Solves basic problems with no assist  Function - Memory Memory assist level: More than reasonable amount of time Patient normally able to recall (first 3 days only): Current season, Location of own room, Staff names and faces, That he or she is in a hospital  Medical Problem List and Plan: 1. 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 DVT Prophylaxis/Anticoagulation: SCDs.   Vascular study neg for DVT. 3. Pain Management: Neurontin--continue at 33m tid  -hydrocodone as needed  -fair control 4. Mood: Provide emotional support  -continued anxiety  -xanax prn  -  5. Neuropsych: This patient iscapable of making decisions on hisown behalf. 6. Skin/Wound Care: Routine skin checks 7. Fluids/Electrolytes/Nutrition: -labs ok    8.Dysphagia.  .   -improving voice and cough. On water protocol  -MBS yesterday---upgraded to D1/nectars  -dv IVF 9.CAD status post CABG. No chest pain or shortness of breath. Discusswith neurosurgery onresuming aspirin 81 mg daily 10.Hypertension. Cozaar 100 mg daily, Lasix 20 mg twice a day. Monitor with increased mobility Vitals:   11/09/16 1441 11/10/16 0603  BP: 137/65 (!) 110/52  Pulse: 76 64  Resp: 18 18  Temp: 98.9 F (37.2 C) 99.1 F (37.3 C)   Control improved 11.MRSA PCR screening positive. Contact precautions 12.Sinusitis.Unasyn changed to augmentin (duration?)---see no further signs, afebrile  -off abx  -wbc's 11.2   12.Hyponatremia.  -likely related to diuretics  - Na+ stable now at 136 13.Neurogenic bladder:    -continued incontinence  -  my recommendation is timed voids with cath as needed during the day to keep output volume in 300-550 range and condom cath at night. Family to  be in today and he will decide after reviewing with them. He doesn't want to be a "burden" on his family re: I/O caths. 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 Hemorrhoids--improved   -Tucks    - TID sitz bath   prn  .   LOS (Days) 19 A FACE TO FACE EVALUATION WAS PERFORMED  Beyza Bellino T 11/10/2016, 8:18 AM

## 2016-11-10 NOTE — Progress Notes (Addendum)
Called to room by patients spouse who needed nurse urgently because the patient "fell out."  Upon entering room, patient was lying across the bed with head hanging off other side.  Patient responded to voice but appears pale and diaphoretic.  VSS (see flowsheet).  CBG checked even though no history of blood sugar problems -105.  [place], PA who ordered labs be drawn and to monitor patient.  Will scan bladder to assess for fullness as well.  Patient recovering lying in bed.  Denies pain and he thinks this episode occurred due to anxiety of going home tomorrow.  He also states he has been nauseous all day, although he did not tell primary nurse this.   Brita Romp, RN

## 2016-11-10 NOTE — Progress Notes (Signed)
Speech Language Pathology Discharge Summary  Patient Details  Name: Joe [name] MRN: [id number] Date of Birth: [birth year]  Today's Date: 11/10/2016  Patient has met 3 of 3 long term goals.  Patient to discharge at overall Independent level.  Reasons goals not met:     Clinical Impression/Discharge Summary:   Pt hs made progress towards dysphagia goals as evidenced by results of Modified Barium Swallow Study this day. Pt with recent diet upgrade to dysphagia 3 with thin liquids when using LEFT HEAD TURN. All education complete and all questions answers to pt and family satisfaction.   Care Partner:  Caregiver Able to Provide Assistance: No     Recommendation:  Home Health SLP  Rationale for SLP Follow Up: Maximize swallowing safety;Reduce caregiver burden   Equipment: N/A   Reasons for discharge: Treatment goals met   Patient/Family Agrees with Progress Made and Goals Achieved: Yes   Function:  Eating Eating   Modified Consistency Diet: Yes Eating Assist Level: More than reasonable amount of time;Set up assist for   Eating Set Up Assist For: Opening containers       Cognition Comprehension Comprehension assist level: Understands basic 90% of the time/cues < 10% of the time  Expression   Expression assist level: Expresses basic 90% of the time/requires cueing < 10% of the time.  Social Interaction Social Interaction assist level: Interacts appropriately 50 - 74% of the time - May be physically or verbally inappropriate.  Problem Solving Problem solving assist level: Solves basic 90% of the time/requires cueing < 10% of the time  Memory Memory assist level: More than reasonable amount of time   Joe [name], M.S., CCC-SLP Speech-Language Pathologist  Joe [name] 11/10/2016, 2:08 PM

## 2016-11-10 NOTE — Progress Notes (Signed)
Occupational Therapy Note  Patient Details  Name: Joe [name] MRN: [id number] Date of Birth: February 09, 1946  Today's Date: 11/10/2016 OT Individual Time: 1330-1400 OT Individual Time Calculation (min): 30 min   1:1 Pt declined ADL retraining or UB strengthening. Chose to preform 8 min on Nustep at level 3 progressed to level 4 for endurance/ activity tolerance. Also discussed improving activity tolerance at home and d/c planning. Pt also declined ambulation during this session. Left with next therapist.  No c/o pain Pt decline formal ADL   [name] 11/10/2016, 3:34 PM

## 2016-11-10 NOTE — Progress Notes (Signed)
Speech Language Pathology Note  Patient Details  Name: Joe [name] MRN: [id number] Date of Birth: 17-Jul-1946 Today's Date: 11/10/2016  Modified Barium Swallow Study completed, see full report under imaging section.  Joe [name] B. Rutherford Nail, M.S., CCC-SLP Speech-Language Pathologist    Yuritzy Zehring 11/10/2016, 12:23 PM

## 2016-11-10 NOTE — Progress Notes (Signed)
Occupational Therapy Session Note  Patient Details  Name: Joe [name] MRN: [id number] Date of Birth: 09-29-1945  Today's Date: 11/10/2016 OT Individual Time: 1400-1421 OT Individual Time Calculation (min): 21 min  and Today's Date: 11/10/2016 OT Missed Time: 39 Minutes Missed Time Reason: Patient fatigue;Patient unwilling/refused to participate without medical reason   Short Term Goals: Week 3:  OT Short Term Goal 1 (Week 3): STG=LTG due to LOS  Skilled Therapeutic Interventions/Progress Updates:    Treatment session with focus on functional transfers and d/c planning.  Pt received after handoff from OT while on Nustep.  Pt reports fatigue and requesting to return to room.  Refused engaging in any dressing tasks, despite encouragement to complete for d/c documentation.  Pt propelled w/c with use of BLE as he reports it causes pain to propel with his UE.  Completed transfers Nustep > w/c > to EOB with supervision with pt demonstrating safety awareness with ensuring w/c brakes were locked before transferring.  Pt reports pleased with upgraded diet and has no concerns regarding upcoming d/c.  Therapy Documentation Precautions:  Precautions Precautions: Fall Restrictions Weight Bearing Restrictions: No General:   Vital Signs: Therapy Vitals Temp: 99.1 F (37.3 C) Temp Source: Oral Pulse Rate: 73 Resp: 19 BP: (!) 108/53 Patient Position (if appropriate): Lying Oxygen Therapy SpO2: 96 % O2 Device: Not Delivered Pain:  Pt with no c/o pain  See Function Navigator for Current Functional Status.   Therapy/Group: Individual Therapy  Simonne Come 11/10/2016, 3:50 PM

## 2016-11-10 NOTE — Progress Notes (Signed)
Speech Language Pathology Daily Session Note  Patient Details  Name: Joe [name] MRN: [id number] Date of Birth: 01-27-1946  Today's Date: 11/10/2016 SLP Individual Time: 0945-1000 SLP Individual Time Calculation (min): 15 min  Short Term Goals: Week 3: SLP Short Term Goal 1 (Week 3): Given supervision cues pt will use compensatory swallow strategies when consuming current diet to reduce aspiration risk.  SLP Short Term Goal 2 (Week 3): Pt will consume trials of ice chips and small sips of thin liquids without overt s/s of aspiration in preparation for further instrumental swallow study.  SLP Short Term Goal 3 (Week 3): Pt will consume nectar thick liquids via spoon without overt s/s of aspiration in preparation for further instrumental swallow study.  SLP Short Term Goal 4 (Week 3): Pt will consume trials of dysphagia 2 without overt s/s of aspiration in preparation for further instrumental swallow study.   Skilled Therapeutic Interventions: Skilled treatment session focused on dysphagia goals and completion of family education with son and daughter. Handouts given list of food items allowed on dysphagia 3 with thin liquids via cup or straw. Pt can also consume pills when halved with thin liquids. All education completed and all questions answered to pt and family satisfaction. Pt was left upright on edge of bed with family members present.      Function:  Eating Eating   Modified Consistency Diet: Yes Eating Assist Level: More than reasonable amount of time   Eating Set Up Assist For: Opening containers       Cognition Comprehension Comprehension assist level: Understands basic 90% of the time/cues < 10% of the time  Expression   Expression assist level: Expresses basic 90% of the time/requires cueing < 10% of the time.  Social Interaction Social Interaction assist level: Interacts appropriately 75 - 89% of the time - Needs redirection for appropriate language or to initiate  interaction.  Problem Solving Problem solving assist level: Solves basic problems with no assist  Memory Memory assist level: More than reasonable amount of time    Pain Pain Assessment Pain Assessment: 0-10 Pain Score: 5  Pain Type: Acute pain Pain Location: Back Pain Orientation: Lower Pain Descriptors / Indicators: Aching Pain Onset: On-going Patients Stated Pain Goal: 3 Pain Intervention(s): Medication (See eMAR)  Therapy/Group: Individual Therapy  Aline Wesche 11/10/2016, 10:00 AM

## 2016-11-10 NOTE — Discharge Instructions (Signed)
Inpatient Rehab Discharge Instructions  Joe [name] Discharge date and time: No discharge date for patient encounter.   Activities/Precautions/ Functional Status: Activity: activity as tolerated Diet: Dysphagia #1 nectar thick liquids Wound Care: none needed Functional status:  ___ No restrictions     ___ Walk up steps independently ___ 24/7 supervision/assistance   ___ Walk up steps with assistance ___ Intermittent supervision/assistance  ___ Bathe/dress independently ___ Walk with walker     _x__ Bathe/dress with assistance ___ Walk Independently    ___ Shower independently ___ Walk with assistance    ___ Shower with assistance ___ No alcohol     ___ Return to work/school ________    COMMUNITY REFERRALS UPON DISCHARGE:    Home Health:   PT    OT     ST   RN                      Agency:  [facility]   Phone: [phone]   Medical Equipment/Items Ordered:  3n1 commode and tub bench                                                      Agency/Supplier:  [employer] @ [phone]        Special Instructions:    My questions have been answered and I understand these instructions. I will adhere to these goals and the provided educational materials after my discharge from the hospital.  Patient/Caregiver Signature _______________________________ Date __________  Clinician Signature _______________________________________ Date __________  Please bring this form and your medication list with you to all your follow-up doctor's appointments.

## 2016-11-11 ENCOUNTER — Inpatient Hospital Stay (HOSPITAL_COMMUNITY): Payer: Medicare Other

## 2016-11-11 ENCOUNTER — Ambulatory Visit (HOSPITAL_COMMUNITY): Payer: Medicare Other

## 2016-11-11 MED ORDER — HYDROCODONE-ACETAMINOPHEN 5-325 MG PO TABS: 1.0000 | ORAL_TABLET | ORAL | 0 refills | Status: DC | PRN

## 2016-11-11 MED ORDER — GABAPENTIN 300 MG PO CAPS: 300.0000 mg | ORAL_CAPSULE | Freq: Three times a day (TID) | ORAL | 1 refills | Status: DC

## 2016-11-11 MED ORDER — SUCRALFATE 1 GM/10ML PO SUSP: 1.0000 g | Freq: Three times a day (TID) | ORAL | 0 refills | Status: DC

## 2016-11-11 MED ORDER — AMLODIPINE BESYLATE 5 MG PO TABS: 5.0000 mg | ORAL_TABLET | Freq: Every day | ORAL | 3 refills | Status: DC

## 2016-11-11 MED ORDER — FUROSEMIDE 20 MG PO TABS: 20.0000 mg | ORAL_TABLET | Freq: Two times a day (BID) | ORAL | 1 refills | Status: DC

## 2016-11-11 MED ORDER — LOSARTAN POTASSIUM 100 MG PO TABS: 100.0000 mg | ORAL_TABLET | Freq: Every day | ORAL | 1 refills | Status: DC

## 2016-11-11 MED ORDER — ALPRAZOLAM 0.25 MG PO TABS: 0.2500 mg | ORAL_TABLET | Freq: Three times a day (TID) | ORAL | 0 refills | Status: DC | PRN
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
## 2016-11-11 NOTE — Progress Notes (Signed)
Patient very argumentative this morning saying he, nor his family, knew about the anticipated discharge time despite numerous discussions yesterday by nurse, therapists, and social work about discharge process.  Inquired about patients plan for bowels and bladder.  Patient has been refusing bowel meds because he is having good bowel movements, but he is still having incontinent episodes of bowel.  Attempted to educate patient on purpose of bowel program to increase predictability of BMs and reduce incontinent episodes, but patient unwilling to listen.  Discussed plan for bladder, patient thinks home health will be coming out when he is unable to void to catheterize him and then leave.  Educated patient that is not how it works and discussed inserting indwelling urinary catheter.  Patient upset, says he was not allowed to make any decisions regarding bladder program.  RN attempted to educate patient on pros and cons of indwelling catheterization vs intermittent catheterization but patient refused teaching. Inserted foley catheter without difficulty. Patient now perseverating on obtaining an itemized bill for "every medication and every procedure" he has had.  Patient finally apologized for being "rude" and having a bad attitude.  Now awaiting discharge instructions and friend to pick him up.  Brita Romp, RN

## 2016-11-11 NOTE — Plan of Care (Signed)
Problem: SCI BOWEL ELIMINATION Goal: RH STG MANAGE BOWEL WITH ASSISTANCE STG Manage Bowel with min Assistance.   Outcome: Not Met (add Reason) Refusing bowel program- incontinent Goal: RH STG SCI MANAGE BOWEL WITH MEDICATION WITH ASSISTANCE STG SCI Manage bowel with medication with min assistance.   Outcome: Not Met (add Reason) Refuses bowel program, incontinent  Problem: SCI BLADDER ELIMINATION Goal: RH STG MANAGE BLADDER WITH ASSISTANCE STG Manage Bladder With Min Assistance   Outcome: Not Met (add Reason) Foley inserted due to patients refusal to do intermittent catheterizations Goal: RH STG MANAGE BLADDER WITH EQUIPMENT WITH ASSISTANCE STG Manage Bladder With Equipment With Min  Assistance   Outcome: Not Met (add Reason) Foley inserted due to patients refusal to do intermittent catheterizations Goal: RH STG SCI MANAGE BLADDER PROGRAM W/ASSISTANCE Manage bladder program w/ min assistance  Outcome: Not Met (add Reason) Foley inserted due to patients refusal to do intermittent catheterizations  Problem: RH SKIN INTEGRITY Goal: RH STG SKIN FREE OF INFECTION/BREAKDOWN Skin free of infection/breakdown with min assist  Outcome: Not Met (add Reason) MASD to groin due to incontinence Goal: RH STG MAINTAIN SKIN INTEGRITY WITH ASSISTANCE STG Maintain Skin Integrity With min Assistance.   Outcome: Not Met (add Reason) Pt refused bowel/bladder program- MASD related to incontinence

## 2016-11-11 NOTE — Progress Notes (Signed)
Patient discharged home.  Left floor via wheelchair, escorted by nursing staff and family.  Barbaraann Rondo, therapy supervisor to assist in car transfer due to patients limited mobility and inexperience in transferring to SUV.  All patient belongings sent with patient, including DME and prescriptions.  Patient verbalized understanding of discharge instructions as given by Marlowe Shores, PA.  Appears to be in no immediate distress at this time.  Brita Romp, RN

## 2016-11-12 DIAGNOSIS — N390 Urinary tract infection, site not specified: Secondary | ICD-10-CM | POA: Diagnosis not present

## 2016-11-12 DIAGNOSIS — R319 Hematuria, unspecified: Secondary | ICD-10-CM | POA: Diagnosis not present

## 2016-11-12 DIAGNOSIS — B9789 Other viral agents as the cause of diseases classified elsewhere: Secondary | ICD-10-CM | POA: Diagnosis not present

## 2016-11-12 DIAGNOSIS — R1031 Right lower quadrant pain: Secondary | ICD-10-CM | POA: Diagnosis not present

## 2016-11-12 DIAGNOSIS — Z9889 Other specified postprocedural states: Secondary | ICD-10-CM | POA: Diagnosis not present

## 2016-11-12 DIAGNOSIS — R109 Unspecified abdominal pain: Secondary | ICD-10-CM | POA: Diagnosis not present

## 2016-11-12 NOTE — Discharge Summary (Signed)
Physician Discharge Summary  Patient ID: Joe [name] MRN: [id number] DOB/AGE: September 02, 1945 71 y.o.  Admit date: 10/17/2016 Discharge date: 10/22/2016  Admission Diagnoses:Status post C3-C4 anterior cervical decompression with swallowing difficulties, Quadraparesis  Discharge Diagnoses: Status post C3-C4 anterior cervical decompression with swallowing difficulties, quadriparesis, hyponatremia, leukocytosis Principal Problem:   Weakness Active Problems:   Hoarseness   Dysphagia, oropharyngeal   Cervical myelopathy (HCC)   Vertigo   Coronary artery disease involving coronary bypass graft of native heart without angina pectoris   Hyponatremia   Leukocytosis   Discharged Condition: fair  Hospital Course: Patient was admitted having had surgery on 10/09/2016 for anterior cervical decompression at the level of C3-C4. He tolerated the surgery well but over time developed difficulties with swallowing. He returned to the hospital on the date of admission was noted to have hyponatremia leukocytosis but no fever a CT scan revealed significant prevertebral mass which was felt to be secondary to edema and some fluid collection but because he was able handle secretions and was breathing comfortably he was admitted for observation and started on steroid medication. He tolerated this well. He is improving now. Because of his quadriparesis is been suggested that he should undergo some further inpatient rehabilitation.  Consults: rehabilitation medicine  Significant Diagnostic Studies: None  Treatments: Physical therapy occupational therapy. Speech and language pathology  Discharge Exam: Blood pressure 133/66, pulse 63, temperature 97.8 F (36.6 C), temperature source Oral, resp. rate 20, height 5\' 9"  (1.753 m), weight 117.4 kg (258 lb 12.8 oz), SpO2 95 %. Incision is clean and dry motor function is intact.  Disposition: Rehabilitation  Discharge Instructions    Call MD for:  difficulty  breathing, headache or visual disturbances    Complete by:  As directed    Call MD for:  persistant dizziness or light-headedness    Complete by:  As directed    Call MD for:  redness, tenderness, or signs of infection (pain, swelling, redness, odor or green/yellow discharge around incision site)    Complete by:  As directed    Call MD for:  severe uncontrolled pain    Complete by:  As directed    Call MD for:  temperature >100.4    Complete by:  As directed    Diet - low sodium heart healthy    Complete by:  As directed    Diet general    Complete by:  As directed    Driving Restrictions    Complete by:  As directed    Do not drive until given clearance.   Increase activity slowly    Complete by:  As directed    Increase activity slowly    Complete by:  As directed    Lifting restrictions    Complete by:  As directed    Do not lift anything >10lbs. Avoid bending and twisting in awkward positions. Avoid bending at the back.     Allergies as of 10/22/2016      Reactions   Lubiprostone Other (See Comments)   REACTION: bloating   Lubiprostone Other (See Comments)   abd pain   Nabumetone Other (See Comments)   REACTION: GI upset   Oxycodone Hcl Nausea And Vomiting   Primidone Other (See Comments)   REACTION: sleepy   Tramadol Other (See Comments)   Upset stomach   Atorvastatin Other (See Comments)   unknown   Codeine Phosphate Other (See Comments)   REACTION: unspecified Dizzy, blurry vision. Motor skill deterioration.    Esomeprazole  Magnesium Other (See Comments)   REACTION: irregular heartbeat   Ezetimibe-simvastatin Other (See Comments)   Famotidine Other (See Comments)   Nausea and bloating.    Fexofenadine-pseudoephed Er Other (See Comments)   REACTION: unspecified   Hydrocodone Nausea And Vomiting   Nizatidine Other (See Comments)   Ticlopidine Hcl Other (See Comments)      Medication List    TAKE these medications   acetaminophen 325 MG tablet Commonly  known as:  TYLENOL Take 2 tablets (650 mg total) by mouth every 6 (six) hours as needed for mild pain (or Fever >/= 101).   aspirin 81 MG tablet Take 81 mg by mouth daily.   fluticasone 50 MCG/ACT nasal spray Commonly known as:  FLONASE Place 2 sprays into both nostrils daily. What changed:  when to take this  reasons to take this   Ipratropium-Albuterol 20-100 MCG/ACT Aers respimat Commonly known as:  COMBIVENT RESPIMAT Inhale 2 Act into the lungs 4 (four) times daily - after meals and at bedtime. What changed:  when to take this  reasons to take this   pyridoxine 100 MG tablet Commonly known as:  B-6 Take 100 mg by mouth daily.   vitamin B-12 1000 MCG tablet Commonly known as:  CYANOCOBALAMIN Take 1,500 mcg by mouth daily. 1 1/2 tab po qd   Vitamin D (Ergocalciferol) 50000 units Caps capsule Commonly known as:  DRISDOL Take 1 capsule (50,000 Units total) by mouth every 7 (seven) days.   Vitamin D 1000 units capsule Take 1,000 Units by mouth daily.        SignedEarleen Newport 11/12/2016, 8:49 AM

## 2016-11-13 ENCOUNTER — Telehealth: Payer: Self-pay | Admitting: Internal Medicine

## 2016-11-13 NOTE — Telephone Encounter (Signed)
Routing to dr plotnikov, fyi.... 

## 2016-11-13 NOTE — Telephone Encounter (Signed)
OK. Thx

## 2016-11-13 NOTE — Telephone Encounter (Signed)
Crissie from [facility] called to let Dr Alain Marion know that the pt was recently in the hospital and discharged. They will be going out to the pts home beginning tomorrow (11/14/16) for PT nursing and OT.

## 2016-11-13 NOTE — Patient Care Conference (Signed)
Inpatient RehabilitationTeam Conference and Plan of Care Update Date: 11/10/2016   Time: 2:05 PM    Patient Name: Joe [name]      Medical Record Number: [id number]  Date of Birth: [birth year] Sex: Male         Room/Bed: 4M07C/4M07C-01 Payor Info: Payor: [employer] / Plan: [facility] MEDICARE / Product Type: *No Product type* /    Admitting Diagnosis: Cervical Mylopathy  Admit Date/Time:  10/22/2016  5:44 PM Admission Comments: No comment available   Primary Diagnosis:  Tetraparesis ([place]) Principal Problem: Tetraparesis [facility])  Patient Active Problem List   Diagnosis Date Noted  . Hemorrhoids   . Neurogenic bowel   . Sinusitis   . Benign essential HTN   . Tetraparesis ([place]) 10/22/2016  . Dysphagia, oropharyngeal   . Cervical myelopathy ([place])   . Vertigo   . Coronary artery disease involving coronary bypass graft of native heart without angina pectoris   . Hyponatremia   . Leukocytosis   . Hoarseness 10/17/2016  . Weakness 10/17/2016  . Weakness of distal arms and legs   . Quadriplegia and quadriparesis ([place]) 10/06/2016  . FTT (failure to thrive) in adult 10/06/2016  . Polyneuropathy ([place]) 10/06/2016  . Urinary incontinence 10/06/2016  . Falls 10/06/2016  . Weight gain 03/03/2016  . Morbid obesity ([place]) 01/15/2015  . Left wrist pain 07/17/2014  . Well adult exam 04/16/2014  . Blurred vision 09/25/2013  . Leg pain, bilateral 05/09/2012  . Left groin pain 10/07/2011  . Sinusitis, acute 05/01/2011  . Tobacco abuse 12/03/2010  . Hyperlipidemia   . Constipation 10/23/2010  . NEOPLASM OF UNCERTAIN BEHAVIOR OF SKIN 08/08/2010  . ECZEMA 08/08/2010  . SHOULDER PAIN 07/16/2010  . WRIST PAIN 06/19/2010  . NECK PAIN 06/19/2010  . Pain in limb 06/19/2010  . PARESTHESIA 06/19/2010  . Headache(784.0) 06/19/2010  . CONCUSSION WITH LOSS OF CONSCIOUSNESS 06/19/2010  . Hypogonadism in male 10/07/2009  . B12 deficiency 02/21/2009  . Essential hypertension 10/15/2008   . VISUAL IMPAIRMENT 06/18/2008  . Vitamin D deficiency 03/13/2008  . Pain in joint 03/13/2008  . RECTAL BLEEDING 01/02/2008  . CHANGE IN BOWELS 01/02/2008  . COLONIC POLYPS, HX OF 12/12/2007  . Anxiety state 10/10/2007  . Essential tremor 09/12/2007  . CARPAL TUNNEL SYNDROME 09/12/2007  . Coronary atherosclerosis 09/12/2007  . PVD (peripheral vascular disease) ([place]) 09/12/2007  . VERTIGO 09/12/2007  . SNORING 09/12/2007  . BRONCHITIS, ACUTE 06/30/2007  . Actinic keratosis 06/30/2007  . Allergic rhinitis 05/11/2007  . COPD mixed type ([place]) 05/11/2007  . OSTEOARTHRITIS 05/11/2007    Expected Discharge Date: Expected Discharge Date: 11/11/16  Team Members Present: Physician leading conference: Dr. Janna Arch, PsyD Social Worker Present: Ovidio Kin, LCSW Nurse Present: Elliot Cousin, RN PT Present: Canary Brim, Harriet Pho, PT OT Present: Napoleon Form, OT SLP Present: Stormy Fabian, SLP PPS Coordinator present : Daiva Nakayama, RN, CRRN     Current Status/Progress Goal Weekly Team Focus  Medical   working on bowel and bladder schedule. incontinent but not retaining. pt experiencing improved motor and sensory funciton  finalize medical plan for discharge  bowel/bladder, family ed, nutrition   Bowel/Bladder   incontinent of bowel at times, PVR and I&O cath while here, foley to be placed prior to discharge  encourage patient/family to continue bowel and bladder training  timed toileting Q2-3 hrs.   Swallow/Nutrition/ Hydration    Supervision - Mod I with dysphagia 1 and nectar thick liquids  Supervision  completion of  repeat objective assessment and pt education  ADL's   Min A LB dressing; set-up -mod I grooming and UB bathing/dressing; supervision transfers  mod I eating and grooming; set up bathing and dressing, supervision toileting and ADL transfers  Family ed; d/c planning   Mobility   modI bed mobility and transfers, S gait with RW and stairs   modI bed mobility and transfers, S gait x50' LRAD and 3 stairs for home entry  pt/family education, activity tolerance, LE strengthening/coordination   Communication             Safety/Cognition/ Behavioral Observations            Pain   no complaints of pain  pain equal to or less tha 4/10  assess pain q4h and prn, medicate if indicated   Skin   bottom red but blanches, barrier cream in use  skin free from injury/breakdowm with min assist  assess skin q shift and prn      *See Care Plan and progress notes for long and short-term goals.  Barriers to Discharge: anxiety, habits, inability to use hands to self-cath    Possible Resolutions to Barriers:  family education, ?timed voiding schedule with hs condom cath    Discharge Planning/Teaching Needs:  Plan home with family to arrange 24/7 assistance.  Teaching completed today with son and daughter   Team Discussion:  Ready for d/c tomorrow. Medically ready as well.  Able to upgrade diet to reg and thin.  Family education completed with family yesterday.  Encouraging timed toileting with condom cath at night.  Pt remains very resistant to learning caths and still plans on "somebody will clean me up" (bowels) at home.  Revisions to Treatment Plan:  Upgraded diet   Continued Need for Acute Rehabilitation Level of Care: The patient requires daily medical management by a physician with specialized training in physical medicine and rehabilitation for the following conditions: Daily direction of a multidisciplinary physical rehabilitation program to ensure safe treatment while eliciting the highest outcome that is of practical value to the patient.: Yes Daily medical management of patient stability for increased activity during participation in an intensive rehabilitation regime.: Yes Daily analysis of laboratory values and/or radiology reports with any subsequent need for medication adjustment of medical intervention for : Post surgical  problems;Neurological problems  HOYLE, LUCY 11/12/2016, 5:44 PM

## 2016-11-14 DIAGNOSIS — I2510 Atherosclerotic heart disease of native coronary artery without angina pectoris: Secondary | ICD-10-CM | POA: Diagnosis not present

## 2016-11-14 DIAGNOSIS — N319 Neuromuscular dysfunction of bladder, unspecified: Secondary | ICD-10-CM | POA: Diagnosis not present

## 2016-11-14 DIAGNOSIS — I10 Essential (primary) hypertension: Secondary | ICD-10-CM | POA: Diagnosis not present

## 2016-11-14 DIAGNOSIS — G8250 Quadriplegia, unspecified: Secondary | ICD-10-CM | POA: Diagnosis not present

## 2016-11-14 DIAGNOSIS — G959 Disease of spinal cord, unspecified: Secondary | ICD-10-CM | POA: Diagnosis not present

## 2016-11-14 DIAGNOSIS — K5900 Constipation, unspecified: Secondary | ICD-10-CM | POA: Diagnosis not present

## 2016-11-14 DIAGNOSIS — Z951 Presence of aortocoronary bypass graft: Secondary | ICD-10-CM | POA: Diagnosis not present

## 2016-11-14 DIAGNOSIS — I739 Peripheral vascular disease, unspecified: Secondary | ICD-10-CM | POA: Diagnosis not present

## 2016-11-14 DIAGNOSIS — K592 Neurogenic bowel, not elsewhere classified: Secondary | ICD-10-CM | POA: Diagnosis not present

## 2016-11-14 DIAGNOSIS — Z981 Arthrodesis status: Secondary | ICD-10-CM | POA: Diagnosis not present

## 2016-11-14 DIAGNOSIS — J449 Chronic obstructive pulmonary disease, unspecified: Secondary | ICD-10-CM | POA: Diagnosis not present

## 2016-11-16 NOTE — Progress Notes (Signed)
Social Work  Discharge Note  The overall goal for the admission was met for:   Discharge location: Yes - home with family to provide 24/7 assistance.  To cover among themselves and possibly some private duty caregivers  Length of Stay: Yes - 20 days  Discharge activity level: Yes - supervision overall  Home/community participation: Yes  Services provided included: MD, RD, PT, OT, SLP, RN, TR, Pharmacy and [place]: [facility] Medicare  Follow-up services arranged: Home Health: RN, PT, OT, ST via [facility], DME: 3n1, tub bench via [place] and Patient/Family has no preference for HH/DME agencies  Comments (or additional information):  Patient/Family verbalized understanding of follow-up arrangements: Yes  Individual responsible for coordination of the follow-up plan: pt  Confirmed correct DME delivered: Tevis Conger 11/16/2016    Tasharra Nodine

## 2016-11-17 DIAGNOSIS — Z981 Arthrodesis status: Secondary | ICD-10-CM | POA: Diagnosis not present

## 2016-11-17 DIAGNOSIS — G959 Disease of spinal cord, unspecified: Secondary | ICD-10-CM | POA: Diagnosis not present

## 2016-11-17 DIAGNOSIS — K5900 Constipation, unspecified: Secondary | ICD-10-CM | POA: Diagnosis not present

## 2016-11-17 DIAGNOSIS — I739 Peripheral vascular disease, unspecified: Secondary | ICD-10-CM | POA: Diagnosis not present

## 2016-11-17 DIAGNOSIS — G8250 Quadriplegia, unspecified: Secondary | ICD-10-CM | POA: Diagnosis not present

## 2016-11-17 DIAGNOSIS — I2510 Atherosclerotic heart disease of native coronary artery without angina pectoris: Secondary | ICD-10-CM | POA: Diagnosis not present

## 2016-11-17 DIAGNOSIS — K592 Neurogenic bowel, not elsewhere classified: Secondary | ICD-10-CM | POA: Diagnosis not present

## 2016-11-17 DIAGNOSIS — N319 Neuromuscular dysfunction of bladder, unspecified: Secondary | ICD-10-CM | POA: Diagnosis not present

## 2016-11-17 DIAGNOSIS — I10 Essential (primary) hypertension: Secondary | ICD-10-CM | POA: Diagnosis not present

## 2016-11-17 DIAGNOSIS — J449 Chronic obstructive pulmonary disease, unspecified: Secondary | ICD-10-CM | POA: Diagnosis not present

## 2016-11-17 DIAGNOSIS — Z951 Presence of aortocoronary bypass graft: Secondary | ICD-10-CM | POA: Diagnosis not present

## 2016-11-18 DIAGNOSIS — Z951 Presence of aortocoronary bypass graft: Secondary | ICD-10-CM | POA: Diagnosis not present

## 2016-11-18 DIAGNOSIS — G959 Disease of spinal cord, unspecified: Secondary | ICD-10-CM | POA: Diagnosis not present

## 2016-11-18 DIAGNOSIS — K5900 Constipation, unspecified: Secondary | ICD-10-CM | POA: Diagnosis not present

## 2016-11-18 DIAGNOSIS — J449 Chronic obstructive pulmonary disease, unspecified: Secondary | ICD-10-CM | POA: Diagnosis not present

## 2016-11-18 DIAGNOSIS — Z981 Arthrodesis status: Secondary | ICD-10-CM | POA: Diagnosis not present

## 2016-11-18 DIAGNOSIS — G8250 Quadriplegia, unspecified: Secondary | ICD-10-CM | POA: Diagnosis not present

## 2016-11-18 DIAGNOSIS — I2510 Atherosclerotic heart disease of native coronary artery without angina pectoris: Secondary | ICD-10-CM | POA: Diagnosis not present

## 2016-11-18 DIAGNOSIS — I10 Essential (primary) hypertension: Secondary | ICD-10-CM | POA: Diagnosis not present

## 2016-11-18 DIAGNOSIS — K592 Neurogenic bowel, not elsewhere classified: Secondary | ICD-10-CM | POA: Diagnosis not present

## 2016-11-18 DIAGNOSIS — I739 Peripheral vascular disease, unspecified: Secondary | ICD-10-CM | POA: Diagnosis not present

## 2016-11-18 DIAGNOSIS — N319 Neuromuscular dysfunction of bladder, unspecified: Secondary | ICD-10-CM | POA: Diagnosis not present

## 2016-11-20 DIAGNOSIS — J449 Chronic obstructive pulmonary disease, unspecified: Secondary | ICD-10-CM | POA: Diagnosis not present

## 2016-11-20 DIAGNOSIS — I10 Essential (primary) hypertension: Secondary | ICD-10-CM | POA: Diagnosis not present

## 2016-11-20 DIAGNOSIS — I739 Peripheral vascular disease, unspecified: Secondary | ICD-10-CM | POA: Diagnosis not present

## 2016-11-20 DIAGNOSIS — Z981 Arthrodesis status: Secondary | ICD-10-CM | POA: Diagnosis not present

## 2016-11-20 DIAGNOSIS — Z951 Presence of aortocoronary bypass graft: Secondary | ICD-10-CM | POA: Diagnosis not present

## 2016-11-20 DIAGNOSIS — K592 Neurogenic bowel, not elsewhere classified: Secondary | ICD-10-CM | POA: Diagnosis not present

## 2016-11-20 DIAGNOSIS — I2510 Atherosclerotic heart disease of native coronary artery without angina pectoris: Secondary | ICD-10-CM | POA: Diagnosis not present

## 2016-11-20 DIAGNOSIS — G8250 Quadriplegia, unspecified: Secondary | ICD-10-CM | POA: Diagnosis not present

## 2016-11-20 DIAGNOSIS — N319 Neuromuscular dysfunction of bladder, unspecified: Secondary | ICD-10-CM | POA: Diagnosis not present

## 2016-11-20 DIAGNOSIS — K5900 Constipation, unspecified: Secondary | ICD-10-CM | POA: Diagnosis not present

## 2016-11-20 DIAGNOSIS — G959 Disease of spinal cord, unspecified: Secondary | ICD-10-CM | POA: Diagnosis not present

## 2016-11-23 DIAGNOSIS — Z981 Arthrodesis status: Secondary | ICD-10-CM | POA: Diagnosis not present

## 2016-11-23 DIAGNOSIS — G959 Disease of spinal cord, unspecified: Secondary | ICD-10-CM | POA: Diagnosis not present

## 2016-11-23 DIAGNOSIS — G8250 Quadriplegia, unspecified: Secondary | ICD-10-CM | POA: Diagnosis not present

## 2016-11-23 DIAGNOSIS — K592 Neurogenic bowel, not elsewhere classified: Secondary | ICD-10-CM | POA: Diagnosis not present

## 2016-11-23 DIAGNOSIS — I2510 Atherosclerotic heart disease of native coronary artery without angina pectoris: Secondary | ICD-10-CM | POA: Diagnosis not present

## 2016-11-23 DIAGNOSIS — K5900 Constipation, unspecified: Secondary | ICD-10-CM | POA: Diagnosis not present

## 2016-11-23 DIAGNOSIS — I10 Essential (primary) hypertension: Secondary | ICD-10-CM | POA: Diagnosis not present

## 2016-11-23 DIAGNOSIS — N319 Neuromuscular dysfunction of bladder, unspecified: Secondary | ICD-10-CM | POA: Diagnosis not present

## 2016-11-23 DIAGNOSIS — J449 Chronic obstructive pulmonary disease, unspecified: Secondary | ICD-10-CM | POA: Diagnosis not present

## 2016-11-23 DIAGNOSIS — I739 Peripheral vascular disease, unspecified: Secondary | ICD-10-CM | POA: Diagnosis not present

## 2016-11-23 DIAGNOSIS — Z951 Presence of aortocoronary bypass graft: Secondary | ICD-10-CM | POA: Diagnosis not present

## 2016-11-26 DIAGNOSIS — R339 Retention of urine, unspecified: Secondary | ICD-10-CM | POA: Insufficient documentation

## 2016-11-27 DIAGNOSIS — N319 Neuromuscular dysfunction of bladder, unspecified: Secondary | ICD-10-CM | POA: Diagnosis not present

## 2016-11-27 DIAGNOSIS — G8250 Quadriplegia, unspecified: Secondary | ICD-10-CM | POA: Diagnosis not present

## 2016-11-27 DIAGNOSIS — I10 Essential (primary) hypertension: Secondary | ICD-10-CM | POA: Diagnosis not present

## 2016-11-27 DIAGNOSIS — I2510 Atherosclerotic heart disease of native coronary artery without angina pectoris: Secondary | ICD-10-CM | POA: Diagnosis not present

## 2016-11-27 DIAGNOSIS — J449 Chronic obstructive pulmonary disease, unspecified: Secondary | ICD-10-CM | POA: Diagnosis not present

## 2016-11-27 DIAGNOSIS — K592 Neurogenic bowel, not elsewhere classified: Secondary | ICD-10-CM | POA: Diagnosis not present

## 2016-11-27 DIAGNOSIS — I739 Peripheral vascular disease, unspecified: Secondary | ICD-10-CM | POA: Diagnosis not present

## 2016-11-27 DIAGNOSIS — Z981 Arthrodesis status: Secondary | ICD-10-CM | POA: Diagnosis not present

## 2016-11-27 DIAGNOSIS — G959 Disease of spinal cord, unspecified: Secondary | ICD-10-CM | POA: Diagnosis not present

## 2016-11-27 DIAGNOSIS — Z951 Presence of aortocoronary bypass graft: Secondary | ICD-10-CM | POA: Diagnosis not present

## 2016-11-27 DIAGNOSIS — K5900 Constipation, unspecified: Secondary | ICD-10-CM | POA: Diagnosis not present

## 2016-12-01 DIAGNOSIS — K592 Neurogenic bowel, not elsewhere classified: Secondary | ICD-10-CM | POA: Diagnosis not present

## 2016-12-01 DIAGNOSIS — G959 Disease of spinal cord, unspecified: Secondary | ICD-10-CM | POA: Diagnosis not present

## 2016-12-01 DIAGNOSIS — N319 Neuromuscular dysfunction of bladder, unspecified: Secondary | ICD-10-CM | POA: Diagnosis not present

## 2016-12-01 DIAGNOSIS — Z951 Presence of aortocoronary bypass graft: Secondary | ICD-10-CM | POA: Diagnosis not present

## 2016-12-01 DIAGNOSIS — I2510 Atherosclerotic heart disease of native coronary artery without angina pectoris: Secondary | ICD-10-CM | POA: Diagnosis not present

## 2016-12-01 DIAGNOSIS — Z981 Arthrodesis status: Secondary | ICD-10-CM | POA: Diagnosis not present

## 2016-12-01 DIAGNOSIS — G8250 Quadriplegia, unspecified: Secondary | ICD-10-CM | POA: Diagnosis not present

## 2016-12-01 DIAGNOSIS — I739 Peripheral vascular disease, unspecified: Secondary | ICD-10-CM | POA: Diagnosis not present

## 2016-12-01 DIAGNOSIS — K5900 Constipation, unspecified: Secondary | ICD-10-CM | POA: Diagnosis not present

## 2016-12-01 DIAGNOSIS — J449 Chronic obstructive pulmonary disease, unspecified: Secondary | ICD-10-CM | POA: Diagnosis not present

## 2016-12-01 DIAGNOSIS — I10 Essential (primary) hypertension: Secondary | ICD-10-CM | POA: Diagnosis not present

## 2016-12-02 ENCOUNTER — Encounter: Payer: Self-pay | Admitting: Internal Medicine

## 2016-12-02 ENCOUNTER — Encounter: Payer: Medicare Other | Admitting: Physical Medicine & Rehabilitation

## 2016-12-02 ENCOUNTER — Other Ambulatory Visit (INDEPENDENT_AMBULATORY_CARE_PROVIDER_SITE_OTHER): Payer: Medicare Other

## 2016-12-02 ENCOUNTER — Ambulatory Visit (INDEPENDENT_AMBULATORY_CARE_PROVIDER_SITE_OTHER): Payer: Medicare Other | Admitting: Internal Medicine

## 2016-12-02 DIAGNOSIS — E559 Vitamin D deficiency, unspecified: Secondary | ICD-10-CM

## 2016-12-02 DIAGNOSIS — G629 Polyneuropathy, unspecified: Secondary | ICD-10-CM

## 2016-12-02 DIAGNOSIS — I10 Essential (primary) hypertension: Secondary | ICD-10-CM

## 2016-12-02 DIAGNOSIS — R600 Localized edema: Secondary | ICD-10-CM | POA: Diagnosis not present

## 2016-12-02 DIAGNOSIS — G959 Disease of spinal cord, unspecified: Secondary | ICD-10-CM | POA: Diagnosis not present

## 2016-12-02 DIAGNOSIS — E538 Deficiency of other specified B group vitamins: Secondary | ICD-10-CM | POA: Diagnosis not present

## 2016-12-02 DIAGNOSIS — R609 Edema, unspecified: Secondary | ICD-10-CM | POA: Insufficient documentation

## 2016-12-02 DIAGNOSIS — G8250 Quadriplegia, unspecified: Secondary | ICD-10-CM | POA: Diagnosis not present

## 2016-12-02 LAB — HEPATIC FUNCTION PANEL
ALBUMIN: 3.6 g/dL (ref 3.5–5.2)
ALT: 33 U/L (ref 0–53)
AST: 26 U/L (ref 0–37)
Alkaline Phosphatase: 58 U/L (ref 39–117)
BILIRUBIN TOTAL: 0.9 mg/dL (ref 0.2–1.2)
Bilirubin, Direct: 0.2 mg/dL (ref 0.0–0.3)
Total Protein: 6.6 g/dL (ref 6.0–8.3)

## 2016-12-02 LAB — VITAMIN B12: VITAMIN B 12: 1260 pg/mL — AB (ref 211–911)

## 2016-12-02 LAB — TSH: TSH: 0.08 u[IU]/mL — AB (ref 0.35–4.50)

## 2016-12-02 NOTE — Assessment & Plan Note (Signed)
Recovering PT

## 2016-12-02 NOTE — Assessment & Plan Note (Signed)
In PT 

## 2016-12-02 NOTE — Progress Notes (Signed)
Subjective:  Patient ID: Joe [name], male    DOB: 01/14/1946  Age: 71 y.o. MRN: [id number]  CC: No chief complaint on file.   HPI Joe [name] presents for weakness, incontinence, HTN, pre-DM f/u. In PT  Outpatient Medications Prior to Visit  Medication Sig Dispense Refill  . acetaminophen (TYLENOL) 325 MG tablet Take 2 tablets (650 mg total) by mouth every 6 (six) hours as needed for mild pain (or Fever >/= 101). 30 tablet 0  . ALPRAZolam (XANAX) 0.25 MG tablet Take 1 tablet (0.25 mg total) by mouth 3 (three) times daily as needed for anxiety. 30 tablet 0  . amLODipine (NORVASC) 5 MG tablet Take 1 tablet (5 mg total) by mouth daily. 90 tablet 3  . aspirin 81 MG tablet Take 81 mg by mouth daily.      . Cholecalciferol (VITAMIN D) 1000 UNITS capsule Take 1,000 Units by mouth daily.      . fluticasone (FLONASE) 50 MCG/ACT nasal spray Place 2 sprays into both nostrils daily. (Patient taking differently: Place 2 sprays into both nostrils daily as needed for allergies. ) 16 g 6  . furosemide (LASIX) 20 MG tablet Take 1 tablet (20 mg total) by mouth 2 (two) times daily. 60 tablet 1  . gabapentin (NEURONTIN) 300 MG capsule Take 1 capsule (300 mg total) by mouth 3 (three) times daily. 90 capsule 1  . HYDROcodone-acetaminophen (NORCO/VICODIN) 5-325 MG tablet Take 1-2 tablets by mouth every 4 (four) hours as needed for moderate pain. 30 tablet 0  . Ipratropium-Albuterol (COMBIVENT RESPIMAT) 20-100 MCG/ACT AERS Inhale 2 Act into the lungs 4 (four) times daily - after meals and at bedtime. (Patient taking differently: Inhale 2 Act into the lungs every 6 (six) hours as needed for shortness of breath. ) 1 Inhaler 11  . losartan (COZAAR) 100 MG tablet Take 1 tablet (100 mg total) by mouth daily. 90 tablet 1  . pyridoxine (B-6) 100 MG tablet Take 100 mg by mouth daily.    . sucralfate (CARAFATE) 1 GM/10ML suspension Take 10 mLs (1 g total) by mouth 3 (three) times daily with meals. 420 mL 0  .  vitamin B-12 (CYANOCOBALAMIN) 1000 MCG tablet Take 1,500 mcg by mouth daily. 1 1/2 tab po qd     . Vitamin D, Ergocalciferol, (DRISDOL) 50000 units CAPS capsule Take 1 capsule (50,000 Units total) by mouth every 7 (seven) days. 4 capsule 0   No facility-administered medications prior to visit.     ROS Review of Systems  Constitutional: Negative for appetite change, fatigue and unexpected weight change.  HENT: Negative for congestion, nosebleeds, sneezing, sore throat and trouble swallowing.   Eyes: Negative for itching and visual disturbance.  Respiratory: Negative for cough.   Cardiovascular: Positive for leg swelling. Negative for chest pain and palpitations.  Gastrointestinal: Negative for abdominal distention, blood in stool, diarrhea and nausea.  Genitourinary: Negative for frequency and hematuria.  Musculoskeletal: Positive for back pain and gait problem. Negative for joint swelling and neck pain.  Skin: Negative for rash.  Neurological: Positive for weakness. Negative for dizziness, tremors and speech difficulty.  Psychiatric/Behavioral: Negative for agitation, dysphoric mood and sleep disturbance. The patient is not nervous/anxious.     Objective:  BP 126/76 (BP Location: Left Arm, Patient Position: Sitting, Cuff Size: Large)   Pulse (!) 56   Temp 98.1 F (36.7 C) (Oral)   Ht 5\' 9"  (1.753 m)   Wt 274 lb 1.3 oz (124.3 kg)   SpO2  98%   BMI 40.47 kg/m   BP Readings from Last 3 Encounters:  12/02/16 126/76  11/11/16 128/65  10/22/16 133/66    Wt Readings from Last 3 Encounters:  12/02/16 274 lb 1.3 oz (124.3 kg)  11/04/16 252 lb 6.8 oz (114.5 kg)  10/19/16 258 lb 12.8 oz (117.4 kg)    Physical Exam  Constitutional: He is oriented to person, place, and time. He appears well-developed. No distress.  NAD  HENT:  Mouth/Throat: Oropharynx is clear and moist.  Eyes: Conjunctivae are normal. Pupils are equal, round, and reactive to light.  Neck: Normal range of motion.  No JVD present. No thyromegaly present.  Cardiovascular: Normal rate, regular rhythm, normal heart sounds and intact distal pulses.  Exam reveals no gallop and no friction rub.   No murmur heard. Pulmonary/Chest: Effort normal and breath sounds normal. No respiratory distress. He has no wheezes. He has no rales. He exhibits no tenderness.  Abdominal: Soft. Bowel sounds are normal. He exhibits no distension and no mass. There is no tenderness. There is no rebound and no guarding.  Musculoskeletal: Normal range of motion. He exhibits edema. He exhibits no tenderness.  Lymphadenopathy:    He has no cervical adenopathy.  Neurological: He is alert and oriented to person, place, and time. He has normal reflexes. No cranial nerve deficit. He exhibits normal muscle tone. He displays a negative Romberg sign. Coordination and gait normal.  Skin: Skin is warm and dry. No rash noted.  Psychiatric: He has a normal mood and affect. His behavior is normal. Judgment and thought content normal.   in a w/c-walker Urinary accident in progress Less obese  Lab Results  Component Value Date   WBC 11.2 (H) 10/30/2016   HGB 13.8 10/30/2016   HCT 40.1 10/30/2016   PLT 234 10/30/2016   GLUCOSE 104 (H) 11/10/2016   CHOL 177 06/25/2015   TRIG 96.0 06/25/2015   HDL 34.90 (L) 06/25/2015   LDLDIRECT 152.5 10/04/2009   LDLCALC 123 (H) 06/25/2015   ALT 43 10/23/2016   AST 22 10/23/2016   NA 135 11/10/2016   K 4.3 11/10/2016   CL 96 (L) 11/10/2016   CREATININE 1.11 11/10/2016   BUN 9 11/10/2016   CO2 30 11/10/2016   TSH 0.464 10/06/2016   PSA 2.51 06/25/2015   HGBA1C 6.1 06/29/2016    No results found.  Assessment & Plan:   There are no diagnoses linked to this encounter. I am having Mr. Jacuinde maintain his Vitamin D, vitamin B-12, aspirin, Ipratropium-Albuterol, fluticasone, pyridoxine, acetaminophen, Vitamin D (Ergocalciferol), ALPRAZolam, amLODipine, furosemide, gabapentin, HYDROcodone-acetaminophen,  losartan, and sucralfate.  No orders of the defined types were placed in this encounter.    Follow-up: No Follow-up on file.  Walker Kehr, MD

## 2016-12-02 NOTE — Assessment & Plan Note (Signed)
Better  

## 2016-12-02 NOTE — Assessment & Plan Note (Signed)
On B12 Labs 

## 2016-12-02 NOTE — Assessment & Plan Note (Signed)
Worse - will d/c Norvasc

## 2016-12-02 NOTE — Assessment & Plan Note (Signed)
On Vit D 

## 2016-12-02 NOTE — Progress Notes (Signed)
Pre visit review using our clinic review tool, if applicable. No additional management support is needed unless otherwise documented below in the visit note. 

## 2016-12-03 ENCOUNTER — Telehealth: Payer: Self-pay | Admitting: General Practice

## 2016-12-03 DIAGNOSIS — I10 Essential (primary) hypertension: Secondary | ICD-10-CM | POA: Diagnosis not present

## 2016-12-03 DIAGNOSIS — G959 Disease of spinal cord, unspecified: Secondary | ICD-10-CM | POA: Diagnosis not present

## 2016-12-03 DIAGNOSIS — J449 Chronic obstructive pulmonary disease, unspecified: Secondary | ICD-10-CM | POA: Diagnosis not present

## 2016-12-03 DIAGNOSIS — N319 Neuromuscular dysfunction of bladder, unspecified: Secondary | ICD-10-CM | POA: Diagnosis not present

## 2016-12-03 DIAGNOSIS — Z951 Presence of aortocoronary bypass graft: Secondary | ICD-10-CM | POA: Diagnosis not present

## 2016-12-03 DIAGNOSIS — E039 Hypothyroidism, unspecified: Secondary | ICD-10-CM

## 2016-12-03 DIAGNOSIS — K5900 Constipation, unspecified: Secondary | ICD-10-CM | POA: Diagnosis not present

## 2016-12-03 DIAGNOSIS — K592 Neurogenic bowel, not elsewhere classified: Secondary | ICD-10-CM | POA: Diagnosis not present

## 2016-12-03 DIAGNOSIS — I2510 Atherosclerotic heart disease of native coronary artery without angina pectoris: Secondary | ICD-10-CM | POA: Diagnosis not present

## 2016-12-03 DIAGNOSIS — R946 Abnormal results of thyroid function studies: Principal | ICD-10-CM

## 2016-12-03 DIAGNOSIS — G8250 Quadriplegia, unspecified: Secondary | ICD-10-CM | POA: Diagnosis not present

## 2016-12-03 DIAGNOSIS — Z981 Arthrodesis status: Secondary | ICD-10-CM | POA: Diagnosis not present

## 2016-12-03 DIAGNOSIS — I739 Peripheral vascular disease, unspecified: Secondary | ICD-10-CM | POA: Diagnosis not present

## 2016-12-04 NOTE — Telephone Encounter (Signed)
Open lab order for free T4

## 2016-12-08 ENCOUNTER — Telehealth: Payer: Self-pay | Admitting: Emergency Medicine

## 2016-12-08 ENCOUNTER — Other Ambulatory Visit (INDEPENDENT_AMBULATORY_CARE_PROVIDER_SITE_OTHER): Payer: Medicare Other

## 2016-12-08 DIAGNOSIS — R946 Abnormal results of thyroid function studies: Secondary | ICD-10-CM | POA: Diagnosis not present

## 2016-12-08 DIAGNOSIS — E039 Hypothyroidism, unspecified: Secondary | ICD-10-CM | POA: Diagnosis not present

## 2016-12-08 MED ORDER — CEFUROXIME AXETIL 500 MG PO TABS: 500.0000 mg | ORAL_TABLET | Freq: Two times a day (BID) | ORAL | 0 refills | Status: DC

## 2016-12-08 NOTE — Telephone Encounter (Signed)
Unable to reach pt, will try later

## 2016-12-08 NOTE — Telephone Encounter (Signed)
Please advise 

## 2016-12-08 NOTE — Telephone Encounter (Signed)
OK Ceftin - Rx Thx

## 2016-12-08 NOTE — Telephone Encounter (Signed)
Pt states he has a sinus infection and wants to know if he can get medication for it before being seen? Please advise thanks.

## 2016-12-09 LAB — T4, FREE: Free T4: 0.92 ng/dL (ref 0.60–1.60)

## 2016-12-10 NOTE — Telephone Encounter (Signed)
Pt notified, pt did already get RX

## 2016-12-22 ENCOUNTER — Encounter: Payer: Self-pay | Admitting: Internal Medicine

## 2016-12-22 ENCOUNTER — Ambulatory Visit (INDEPENDENT_AMBULATORY_CARE_PROVIDER_SITE_OTHER): Payer: Medicare Other | Admitting: Internal Medicine

## 2016-12-22 DIAGNOSIS — R600 Localized edema: Secondary | ICD-10-CM

## 2016-12-22 DIAGNOSIS — I10 Essential (primary) hypertension: Secondary | ICD-10-CM

## 2016-12-22 DIAGNOSIS — E538 Deficiency of other specified B group vitamins: Secondary | ICD-10-CM | POA: Diagnosis not present

## 2016-12-22 DIAGNOSIS — M79605 Pain in left leg: Secondary | ICD-10-CM

## 2016-12-22 DIAGNOSIS — E559 Vitamin D deficiency, unspecified: Secondary | ICD-10-CM

## 2016-12-22 DIAGNOSIS — G8250 Quadriplegia, unspecified: Secondary | ICD-10-CM

## 2016-12-22 DIAGNOSIS — M79604 Pain in right leg: Secondary | ICD-10-CM | POA: Diagnosis not present

## 2016-12-22 MED ORDER — FUROSEMIDE 40 MG PO TABS: 40.0000 mg | ORAL_TABLET | Freq: Every day | ORAL | 5 refills | Status: DC

## 2016-12-22 MED ORDER — VITAMIN D (ERGOCALCIFEROL) 1.25 MG (50000 UNIT) PO CAPS: 50000.0000 [IU] | ORAL_CAPSULE | ORAL | 0 refills | Status: DC

## 2016-12-22 MED ORDER — GABAPENTIN 100 MG PO CAPS: 100.0000 mg | ORAL_CAPSULE | Freq: Three times a day (TID) | ORAL | 5 refills | Status: DC

## 2016-12-22 NOTE — Assessment & Plan Note (Signed)
On B12 

## 2016-12-22 NOTE — Assessment & Plan Note (Signed)
D/c Aleve Compr socks Elevate legs

## 2016-12-22 NOTE — Progress Notes (Signed)
Subjective:  Patient ID: Joe [name], male    DOB: Nov 27, 1945  Age: 71 y.o. MRN: [id number]  CC: No chief complaint on file.   HPI Joe [name] presents for edema - worse,  Cold legs, paraparesis f/u  Outpatient Medications Prior to Visit  Medication Sig Dispense Refill  . acetaminophen (TYLENOL) 325 MG tablet Take 2 tablets (650 mg total) by mouth every 6 (six) hours as needed for mild pain (or Fever >/= 101). 30 tablet 0  . ALPRAZolam (XANAX) 0.25 MG tablet Take 1 tablet (0.25 mg total) by mouth 3 (three) times daily as needed for anxiety. 30 tablet 0  . aspirin 81 MG tablet Take 81 mg by mouth daily.      . Cholecalciferol (VITAMIN D) 1000 UNITS capsule Take 1,000 Units by mouth daily.      . fluticasone (FLONASE) 50 MCG/ACT nasal spray Place 2 sprays into both nostrils daily. (Patient taking differently: Place 2 sprays into both nostrils daily as needed for allergies. ) 16 g 6  . gabapentin (NEURONTIN) 300 MG capsule Take 1 capsule (300 mg total) by mouth 3 (three) times daily. (Patient taking differently: Take 300 mg by mouth daily. ) 90 capsule 1  . Ipratropium-Albuterol (COMBIVENT RESPIMAT) 20-100 MCG/ACT AERS Inhale 2 Act into the lungs 4 (four) times daily - after meals and at bedtime. (Patient taking differently: Inhale 2 Act into the lungs every 6 (six) hours as needed for shortness of breath. ) 1 Inhaler 11  . losartan (COZAAR) 100 MG tablet Take 1 tablet (100 mg total) by mouth daily. 90 tablet 1  . pyridoxine (B-6) 100 MG tablet Take 100 mg by mouth daily.    . vitamin B-12 (CYANOCOBALAMIN) 1000 MCG tablet Take 1,500 mcg by mouth daily. 1 1/2 tab po qd     . cefUROXime (CEFTIN) 500 MG tablet Take 1 tablet (500 mg total) by mouth 2 (two) times daily with a meal. 20 tablet 0  . furosemide (LASIX) 20 MG tablet Take 1 tablet (20 mg total) by mouth 2 (two) times daily. 60 tablet 1  . Vitamin D, Ergocalciferol, (DRISDOL) 50000 units CAPS capsule Take 1 capsule (50,000 Units  total) by mouth every 7 (seven) days. 4 capsule 0  . HYDROcodone-acetaminophen (NORCO/VICODIN) 5-325 MG tablet Take 1-2 tablets by mouth every 4 (four) hours as needed for moderate pain. 30 tablet 0  . sucralfate (CARAFATE) 1 GM/10ML suspension Take 10 mLs (1 g total) by mouth 3 (three) times daily with meals. 420 mL 0   No facility-administered medications prior to visit.     ROS Review of Systems  Constitutional: Negative for appetite change, fatigue and unexpected weight change.  HENT: Negative for congestion, nosebleeds, sneezing, sore throat and trouble swallowing.   Eyes: Negative for itching and visual disturbance.  Respiratory: Negative for cough.   Cardiovascular: Positive for leg swelling. Negative for chest pain and palpitations.  Gastrointestinal: Negative for abdominal distention, blood in stool, diarrhea and nausea.  Genitourinary: Negative for frequency and hematuria.  Musculoskeletal: Positive for gait problem. Negative for back pain, joint swelling and neck pain.  Skin: Negative for rash.  Neurological: Positive for weakness. Negative for dizziness, tremors and speech difficulty.  Psychiatric/Behavioral: Negative for agitation, dysphoric mood and sleep disturbance. The patient is not nervous/anxious.     Objective:  BP 138/76 (BP Location: Left Arm, Patient Position: Sitting, Cuff Size: Large)   Pulse 64   Temp 97.5 F (36.4 C) (Oral)   Ht 5'  9" (1.753 m)   Wt 243 lb (110.2 kg)   SpO2 99%   BMI 35.88 kg/m   BP Readings from Last 3 Encounters:  12/22/16 138/76  12/02/16 126/76  11/11/16 128/65    Wt Readings from Last 3 Encounters:  12/22/16 243 lb (110.2 kg)  12/02/16 274 lb 1.3 oz (124.3 kg)  11/04/16 252 lb 6.8 oz (114.5 kg)    Physical Exam  Constitutional: He is oriented to person, place, and time. He appears well-developed. No distress.  NAD  HENT:  Mouth/Throat: Oropharynx is clear and moist.  Eyes: Conjunctivae are normal. Pupils are equal,  round, and reactive to light.  Neck: Normal range of motion. No JVD present. No thyromegaly present.  Cardiovascular: Normal rate, regular rhythm, normal heart sounds and intact distal pulses.  Exam reveals no gallop and no friction rub.   No murmur heard. Pulmonary/Chest: Effort normal and breath sounds normal. No respiratory distress. He has no wheezes. He has no rales. He exhibits no tenderness.  Abdominal: Soft. Bowel sounds are normal. He exhibits no distension and no mass. There is no tenderness. There is no rebound and no guarding.  Musculoskeletal: Normal range of motion. He exhibits edema and tenderness.  Lymphadenopathy:    He has no cervical adenopathy.  Neurological: He is alert and oriented to person, place, and time. He has normal reflexes. No cranial nerve deficit. He exhibits normal muscle tone. He displays a negative Romberg sign. Coordination abnormal. Gait normal.  Skin: Skin is warm and dry. No rash noted.  Psychiatric: He has a normal mood and affect. His behavior is normal. Judgment and thought content normal.  Obese Feet w/2+ edema  Lab Results  Component Value Date   WBC 11.2 (H) 10/30/2016   HGB 13.8 10/30/2016   HCT 40.1 10/30/2016   PLT 234 10/30/2016   GLUCOSE 104 (H) 11/10/2016   CHOL 177 06/25/2015   TRIG 96.0 06/25/2015   HDL 34.90 (L) 06/25/2015   LDLDIRECT 152.5 10/04/2009   LDLCALC 123 (H) 06/25/2015   ALT 33 12/02/2016   AST 26 12/02/2016   NA 135 11/10/2016   K 4.3 11/10/2016   CL 96 (L) 11/10/2016   CREATININE 1.11 11/10/2016   BUN 9 11/10/2016   CO2 30 11/10/2016   TSH 0.08 (L) 12/02/2016   PSA 2.51 06/25/2015   HGBA1C 6.1 06/29/2016    No results found.  Assessment & Plan:   There are no diagnoses linked to this encounter. I have discontinued Mr. Doggett's furosemide, HYDROcodone-acetaminophen, sucralfate, and cefUROXime. I am also having him start on furosemide. Additionally, I am having him maintain his Vitamin D, vitamin B-12,  aspirin, Ipratropium-Albuterol, fluticasone, pyridoxine, acetaminophen, ALPRAZolam, gabapentin, losartan, and Vitamin D (Ergocalciferol).  Meds ordered this encounter  Medications  . Vitamin D, Ergocalciferol, (DRISDOL) 50000 units CAPS capsule    Sig: Take 1 capsule (50,000 Units total) by mouth every 7 (seven) days.    Dispense:  4 capsule    Refill:  0  . furosemide (LASIX) 40 MG tablet    Sig: Take 1 tablet (40 mg total) by mouth daily.    Dispense:  30 tablet    Refill:  5     Follow-up: No Follow-up on file.  Walker Kehr, MD

## 2016-12-22 NOTE — Progress Notes (Signed)
Pre visit review using our clinic review tool, if applicable. No additional management support is needed unless otherwise documented below in the visit note. 

## 2016-12-22 NOTE — Patient Instructions (Signed)

## 2016-12-22 NOTE — Assessment & Plan Note (Signed)
Worse w/edema Treat edema Gabapentin

## 2016-12-22 NOTE — Assessment & Plan Note (Signed)
On Vit D 

## 2016-12-22 NOTE — Assessment & Plan Note (Signed)
Wt Readings from Last 3 Encounters:  12/22/16 243 lb (110.2 kg)  12/02/16 274 lb 1.3 oz (124.3 kg)  11/04/16 252 lb 6.8 oz (114.5 kg)

## 2016-12-22 NOTE — Assessment & Plan Note (Signed)
Losartan, Lasix 

## 2016-12-22 NOTE — Assessment & Plan Note (Signed)
In a w/c 

## 2016-12-28 DIAGNOSIS — R339 Retention of urine, unspecified: Secondary | ICD-10-CM | POA: Diagnosis not present

## 2017-01-20 ENCOUNTER — Other Ambulatory Visit: Payer: Self-pay | Admitting: *Deleted

## 2017-01-20 DIAGNOSIS — Z7982 Long term (current) use of aspirin: Secondary | ICD-10-CM

## 2017-01-20 DIAGNOSIS — F17210 Nicotine dependence, cigarettes, uncomplicated: Secondary | ICD-10-CM

## 2017-01-20 DIAGNOSIS — N319 Neuromuscular dysfunction of bladder, unspecified: Secondary | ICD-10-CM | POA: Diagnosis not present

## 2017-01-20 DIAGNOSIS — Z466 Encounter for fitting and adjustment of urinary device: Secondary | ICD-10-CM

## 2017-01-20 DIAGNOSIS — Z951 Presence of aortocoronary bypass graft: Secondary | ICD-10-CM

## 2017-01-20 DIAGNOSIS — Z792 Long term (current) use of antibiotics: Secondary | ICD-10-CM

## 2017-01-20 DIAGNOSIS — K5900 Constipation, unspecified: Secondary | ICD-10-CM

## 2017-01-20 DIAGNOSIS — K592 Neurogenic bowel, not elsewhere classified: Secondary | ICD-10-CM | POA: Diagnosis not present

## 2017-01-20 DIAGNOSIS — I10 Essential (primary) hypertension: Secondary | ICD-10-CM | POA: Diagnosis not present

## 2017-01-20 DIAGNOSIS — I739 Peripheral vascular disease, unspecified: Secondary | ICD-10-CM

## 2017-01-20 DIAGNOSIS — Z981 Arthrodesis status: Secondary | ICD-10-CM

## 2017-01-20 DIAGNOSIS — I2510 Atherosclerotic heart disease of native coronary artery without angina pectoris: Secondary | ICD-10-CM | POA: Diagnosis not present

## 2017-01-20 DIAGNOSIS — J449 Chronic obstructive pulmonary disease, unspecified: Secondary | ICD-10-CM

## 2017-01-20 DIAGNOSIS — Z22322 Carrier or suspected carrier of Methicillin resistant Staphylococcus aureus: Secondary | ICD-10-CM

## 2017-01-20 DIAGNOSIS — G959 Disease of spinal cord, unspecified: Secondary | ICD-10-CM | POA: Diagnosis not present

## 2017-01-20 DIAGNOSIS — Z7951 Long term (current) use of inhaled steroids: Secondary | ICD-10-CM

## 2017-01-20 DIAGNOSIS — F419 Anxiety disorder, unspecified: Secondary | ICD-10-CM | POA: Diagnosis not present

## 2017-01-20 DIAGNOSIS — G8250 Quadriplegia, unspecified: Secondary | ICD-10-CM | POA: Diagnosis not present

## 2017-01-20 MED ORDER — VITAMIN D (ERGOCALCIFEROL) 1.25 MG (50000 UNIT) PO CAPS: 50000.0000 [IU] | ORAL_CAPSULE | ORAL | 0 refills | Status: DC

## 2017-01-20 MED ORDER — FUROSEMIDE 40 MG PO TABS: 40.0000 mg | ORAL_TABLET | Freq: Every day | ORAL | 5 refills | Status: DC

## 2017-02-01 ENCOUNTER — Ambulatory Visit: Payer: Medicare Other | Admitting: Internal Medicine

## 2017-02-01 ENCOUNTER — Ambulatory Visit (INDEPENDENT_AMBULATORY_CARE_PROVIDER_SITE_OTHER): Payer: Medicare Other | Admitting: Internal Medicine

## 2017-02-01 ENCOUNTER — Ambulatory Visit (INDEPENDENT_AMBULATORY_CARE_PROVIDER_SITE_OTHER)
Admission: RE | Admit: 2017-02-01 | Discharge: 2017-02-01 | Disposition: A | Discharge status: Home / Self Care | Payer: Medicare Other | Source: Ambulatory Visit | Attending: Internal Medicine | Admitting: Internal Medicine

## 2017-02-01 ENCOUNTER — Encounter: Payer: Self-pay | Admitting: Internal Medicine

## 2017-02-01 VITALS — BP 122/60 | HR 64 | Temp 97.9°F | Ht 69.0 in | Wt 240.0 lb

## 2017-02-01 DIAGNOSIS — R531 Weakness: Secondary | ICD-10-CM

## 2017-02-01 DIAGNOSIS — M25551 Pain in right hip: Secondary | ICD-10-CM | POA: Diagnosis not present

## 2017-02-01 DIAGNOSIS — Z23 Encounter for immunization: Secondary | ICD-10-CM | POA: Diagnosis not present

## 2017-02-01 DIAGNOSIS — E538 Deficiency of other specified B group vitamins: Secondary | ICD-10-CM

## 2017-02-01 DIAGNOSIS — I10 Essential (primary) hypertension: Secondary | ICD-10-CM

## 2017-02-01 DIAGNOSIS — G8250 Quadriplegia, unspecified: Secondary | ICD-10-CM | POA: Diagnosis not present

## 2017-02-01 NOTE — Assessment & Plan Note (Signed)
Making progress

## 2017-02-01 NOTE — Assessment & Plan Note (Signed)
On B12 

## 2017-02-01 NOTE — Assessment & Plan Note (Signed)
X ray

## 2017-02-01 NOTE — Addendum Note (Signed)
Addended by: Karren Cobble on: 02/01/2017 08:19 AM   Modules accepted: Orders

## 2017-02-01 NOTE — Assessment & Plan Note (Signed)
Losartan, Lasix 

## 2017-02-01 NOTE — Assessment & Plan Note (Signed)
Better  

## 2017-02-01 NOTE — Progress Notes (Signed)
Subjective:  Patient ID: Joe [name], male    DOB: Oct 24, 1945  Age: 71 y.o. MRN: [id number]  CC: No chief complaint on file.   HPI Joe [name] presents for gait disorder, LE weakness, neuropathy, HTN f/u. C/o R hip pain - worse  Outpatient Medications Prior to Visit  Medication Sig Dispense Refill  . acetaminophen (TYLENOL) 325 MG tablet Take 2 tablets (650 mg total) by mouth every 6 (six) hours as needed for mild pain (or Fever >/= 101). 30 tablet 0  . ALPRAZolam (XANAX) 0.25 MG tablet Take 1 tablet (0.25 mg total) by mouth 3 (three) times daily as needed for anxiety. 30 tablet 0  . aspirin 81 MG tablet Take 81 mg by mouth daily.      . Cholecalciferol (VITAMIN D) 1000 UNITS capsule Take 1,000 Units by mouth daily.      . fluticasone (FLONASE) 50 MCG/ACT nasal spray Place 2 sprays into both nostrils daily. (Patient taking differently: Place 2 sprays into both nostrils daily as needed for allergies. ) 16 g 6  . furosemide (LASIX) 40 MG tablet Take 1 tablet (40 mg total) by mouth daily. 30 tablet 5  . gabapentin (NEURONTIN) 100 MG capsule Take 1-3 capsules (100-300 mg total) by mouth 3 (three) times daily. 150 capsule 5  . Ipratropium-Albuterol (COMBIVENT RESPIMAT) 20-100 MCG/ACT AERS Inhale 2 Act into the lungs 4 (four) times daily - after meals and at bedtime. (Patient taking differently: Inhale 2 Act into the lungs every 6 (six) hours as needed for shortness of breath. ) 1 Inhaler 11  . losartan (COZAAR) 100 MG tablet Take 1 tablet (100 mg total) by mouth daily. 90 tablet 1  . pyridoxine (B-6) 100 MG tablet Take 100 mg by mouth daily.    . vitamin B-12 (CYANOCOBALAMIN) 1000 MCG tablet Take 1,500 mcg by mouth daily. 1 1/2 tab po qd     . Vitamin D, Ergocalciferol, (DRISDOL) 50000 units CAPS capsule Take 1 capsule (50,000 Units total) by mouth every 7 (seven) days. 4 capsule 0   No facility-administered medications prior to visit.     ROS Review of Systems  Constitutional:  Negative for appetite change, fatigue and unexpected weight change.  HENT: Negative for congestion, nosebleeds, sneezing, sore throat and trouble swallowing.   Eyes: Negative for itching and visual disturbance.  Respiratory: Negative for cough.   Cardiovascular: Negative for chest pain, palpitations and leg swelling.  Gastrointestinal: Negative for abdominal distention, blood in stool, diarrhea and nausea.  Genitourinary: Negative for frequency and hematuria.  Musculoskeletal: Positive for gait problem. Negative for back pain, joint swelling and neck pain.  Skin: Negative for rash.  Neurological: Positive for weakness. Negative for dizziness, tremors, seizures and speech difficulty.  Psychiatric/Behavioral: Negative for agitation, dysphoric mood and sleep disturbance. The patient is not nervous/anxious.     Objective:  BP 122/60 (BP Location: Left Arm, Patient Position: Sitting, Cuff Size: Large)   Pulse 64   Temp 97.9 F (36.6 C) (Oral)   Ht 5\' 9"  (1.753 m)   Wt 240 lb (108.9 kg)   SpO2 97%   BMI 35.44 kg/m   BP Readings from Last 3 Encounters:  02/01/17 122/60  12/22/16 138/76  12/02/16 126/76    Wt Readings from Last 3 Encounters:  02/01/17 240 lb (108.9 kg)  12/22/16 243 lb (110.2 kg)  12/02/16 274 lb 1.3 oz (124.3 kg)    Physical Exam  Constitutional: He is oriented to person, place, and time. He appears  well-developed. No distress.  NAD  HENT:  Mouth/Throat: Oropharynx is clear and moist.  Eyes: Conjunctivae are normal. Pupils are equal, round, and reactive to light.  Neck: Normal range of motion. No JVD present. No thyromegaly present.  Cardiovascular: Normal rate, regular rhythm, normal heart sounds and intact distal pulses.  Exam reveals no gallop and no friction rub.   No murmur heard. Pulmonary/Chest: Effort normal and breath sounds normal. No respiratory distress. He has no wheezes. He has no rales. He exhibits no tenderness.  Abdominal: Soft. Bowel sounds  are normal. He exhibits no distension and no mass. There is no tenderness. There is no rebound and no guarding.  Musculoskeletal: Normal range of motion. He exhibits tenderness. He exhibits no edema.  Lymphadenopathy:    He has no cervical adenopathy.  Neurological: He is alert and oriented to person, place, and time. He has normal reflexes. No cranial nerve deficit. He exhibits normal muscle tone. He displays a negative Romberg sign. Coordination abnormal. Gait normal.  Skin: Skin is warm and dry. No rash noted.  Psychiatric: He has a normal mood and affect. His behavior is normal. Judgment and thought content normal.  Joe R groin is tender w/ROM  Lab Results  Component Value Date   WBC 11.2 (H) 10/30/2016   HGB 13.8 10/30/2016   HCT 40.1 10/30/2016   PLT 234 10/30/2016   GLUCOSE 104 (H) 11/10/2016   CHOL 177 06/25/2015   TRIG 96.0 06/25/2015   HDL 34.90 (L) 06/25/2015   LDLDIRECT 152.5 10/04/2009   LDLCALC 123 (H) 06/25/2015   ALT 33 12/02/2016   AST 26 12/02/2016   NA 135 11/10/2016   K 4.3 11/10/2016   CL 96 (L) 11/10/2016   CREATININE 1.11 11/10/2016   BUN 9 11/10/2016   CO2 30 11/10/2016   TSH 0.08 (L) 12/02/2016   PSA 2.51 06/25/2015   HGBA1C 6.1 06/29/2016    No results found.  Assessment & Plan:   There are no diagnoses linked to this encounter. I am having Joe [name] maintain his Vitamin D, vitamin B-12, aspirin, Ipratropium-Albuterol, fluticasone, pyridoxine, acetaminophen, ALPRAZolam, losartan, gabapentin, Vitamin D (Ergocalciferol), and furosemide.  No orders of the defined types were placed in this encounter.    Follow-up: No Follow-up on file.  Joe Kehr, MD

## 2017-02-14 ENCOUNTER — Other Ambulatory Visit: Payer: Self-pay | Admitting: Internal Medicine

## 2017-02-15 NOTE — Telephone Encounter (Signed)
Routing to dr plotnikov, please advise, thanks 

## 2017-03-25 ENCOUNTER — Other Ambulatory Visit: Payer: Self-pay

## 2017-03-25 MED ORDER — LOSARTAN POTASSIUM 100 MG PO TABS: 100.0000 mg | ORAL_TABLET | Freq: Every day | ORAL | 1 refills | Status: DC

## 2017-04-16 ENCOUNTER — Other Ambulatory Visit: Payer: Self-pay | Admitting: Internal Medicine

## 2017-05-04 ENCOUNTER — Ambulatory Visit (INDEPENDENT_AMBULATORY_CARE_PROVIDER_SITE_OTHER): Payer: Medicare Other | Admitting: Internal Medicine

## 2017-05-04 ENCOUNTER — Other Ambulatory Visit (INDEPENDENT_AMBULATORY_CARE_PROVIDER_SITE_OTHER): Payer: Medicare Other

## 2017-05-04 ENCOUNTER — Encounter: Payer: Self-pay | Admitting: Internal Medicine

## 2017-05-04 DIAGNOSIS — G8250 Quadriplegia, unspecified: Secondary | ICD-10-CM

## 2017-05-04 DIAGNOSIS — J0101 Acute recurrent maxillary sinusitis: Secondary | ICD-10-CM | POA: Diagnosis not present

## 2017-05-04 DIAGNOSIS — I10 Essential (primary) hypertension: Secondary | ICD-10-CM | POA: Diagnosis not present

## 2017-05-04 DIAGNOSIS — I2510 Atherosclerotic heart disease of native coronary artery without angina pectoris: Secondary | ICD-10-CM

## 2017-05-04 DIAGNOSIS — J449 Chronic obstructive pulmonary disease, unspecified: Secondary | ICD-10-CM | POA: Diagnosis not present

## 2017-05-04 DIAGNOSIS — R946 Abnormal results of thyroid function studies: Secondary | ICD-10-CM

## 2017-05-04 DIAGNOSIS — J3089 Other allergic rhinitis: Secondary | ICD-10-CM

## 2017-05-04 DIAGNOSIS — R7989 Other specified abnormal findings of blood chemistry: Secondary | ICD-10-CM

## 2017-05-04 LAB — HEPATIC FUNCTION PANEL
ALT: 20 U/L (ref 0–53)
AST: 18 U/L (ref 0–37)
Albumin: 4.3 g/dL (ref 3.5–5.2)
Alkaline Phosphatase: 61 U/L (ref 39–117)
Bilirubin, Direct: 0.3 mg/dL (ref 0.0–0.3)
TOTAL PROTEIN: 7.3 g/dL (ref 6.0–8.3)
Total Bilirubin: 1.4 mg/dL — ABNORMAL HIGH (ref 0.2–1.2)

## 2017-05-04 LAB — T4, FREE: Free T4: 0.81 ng/dL (ref 0.60–1.60)

## 2017-05-04 LAB — BASIC METABOLIC PANEL
BUN: 11 mg/dL (ref 6–23)
CALCIUM: 9.9 mg/dL (ref 8.4–10.5)
CHLORIDE: 100 meq/L (ref 96–112)
CO2: 29 mEq/L (ref 19–32)
Creatinine, Ser: 0.99 mg/dL (ref 0.40–1.50)
GFR: 79.71 mL/min (ref >60.00)
Glucose, Bld: 102 mg/dL — ABNORMAL HIGH (ref 70–99)
Potassium: 3.9 mEq/L (ref 3.5–5.1)
SODIUM: 138 meq/L (ref 135–145)

## 2017-05-04 LAB — TSH: TSH: 1.11 u[IU]/mL (ref 0.35–4.50)

## 2017-05-04 MED ORDER — LORATADINE 10 MG PO TABS: 10.0000 mg | ORAL_TABLET | Freq: Every day | ORAL | 11 refills | Status: AC

## 2017-05-04 MED ORDER — AMOXICILLIN 500 MG PO CAPS: 500.0000 mg | ORAL_CAPSULE | Freq: Three times a day (TID) | ORAL | 0 refills | Status: DC

## 2017-05-04 MED ORDER — FLUTICASONE PROPIONATE 50 MCG/ACT NA SUSP: 2.0000 | Freq: Every day | NASAL | 11 refills | Status: AC | PRN

## 2017-05-04 NOTE — Patient Instructions (Signed)
Gluten free trial (no wheat products) for 4-6 weeks. OK to use gluten-free bread and gluten-free pasta.  Milk free trial (no milk, ice cream, cheese and yogurt) for 4-6 weeks. OK to use almond, coconut, rice or soy milk. "Almond breeze" brand tastes good.  

## 2017-05-04 NOTE — Assessment & Plan Note (Signed)
Claritin/Flonase Depo-Medrol 80 mg IM

## 2017-05-04 NOTE — Assessment & Plan Note (Signed)
Labs

## 2017-05-04 NOTE — Assessment & Plan Note (Signed)
Loratidine  Amoxicillin po

## 2017-05-04 NOTE — Assessment & Plan Note (Signed)
Losartan, Lasix 

## 2017-05-04 NOTE — Assessment & Plan Note (Signed)
ASA No angina 

## 2017-05-04 NOTE — Assessment & Plan Note (Signed)
Better  

## 2017-05-04 NOTE — Assessment & Plan Note (Signed)
Combivent

## 2017-05-04 NOTE — Progress Notes (Signed)
Subjective:  Patient ID: Joe [name], male    DOB: 1945-12-02  Age: 71 y.o. MRN: [id number]  CC: No chief complaint on file.   HPI MAKHARI DOVIDIO presents for sinus congestion and d/c F/u LBP, LE weakness, anxiety f/u  Outpatient Medications Prior to Visit  Medication Sig Dispense Refill  . acetaminophen (TYLENOL) 325 MG tablet Take 2 tablets (650 mg total) by mouth every 6 (six) hours as needed for mild pain (or Fever >/= 101). 30 tablet 0  . ALPRAZolam (XANAX) 0.25 MG tablet Take 1 tablet (0.25 mg total) by mouth 3 (three) times daily as needed for anxiety. 30 tablet 0  . aspirin 81 MG tablet Take 81 mg by mouth daily.      . Cholecalciferol (VITAMIN D) 1000 UNITS capsule Take 1,000 Units by mouth daily.      . fluticasone (FLONASE) 50 MCG/ACT nasal spray Place 2 sprays into both nostrils daily. (Patient taking differently: Place 2 sprays into both nostrils daily as needed for allergies. ) 16 g 6  . furosemide (LASIX) 40 MG tablet Take 1 tablet (40 mg total) by mouth daily. 30 tablet 5  . Ipratropium-Albuterol (COMBIVENT RESPIMAT) 20-100 MCG/ACT AERS Inhale 2 Act into the lungs 4 (four) times daily - after meals and at bedtime. (Patient taking differently: Inhale 2 Act into the lungs every 6 (six) hours as needed for shortness of breath. ) 1 Inhaler 11  . losartan (COZAAR) 100 MG tablet Take 1 tablet (100 mg total) by mouth daily. 90 tablet 1  . losartan (COZAAR) 100 MG tablet TAKE 1 TABLET (100 MG TOTAL) BY MOUTH DAILY. 90 tablet 1  . pyridoxine (B-6) 100 MG tablet Take 100 mg by mouth daily.    . vitamin B-12 (CYANOCOBALAMIN) 1000 MCG tablet Take 1,500 mcg by mouth daily. 1 1/2 tab po qd     . Vitamin D, Ergocalciferol, (DRISDOL) 50000 units CAPS capsule TAKE 1 CAPSULE (50,000 UNITS TOTAL) BY MOUTH EVERY 7 (SEVEN) DAYS. 4 capsule 0  . gabapentin (NEURONTIN) 100 MG capsule Take 1-3 capsules (100-300 mg total) by mouth 3 (three) times daily. 150 capsule 5   No  facility-administered medications prior to visit.     ROS Review of Systems  Constitutional: Negative for appetite change, fatigue and unexpected weight change.  HENT: Positive for congestion. Negative for nosebleeds, sneezing, sore throat and trouble swallowing.   Eyes: Negative for itching and visual disturbance.  Respiratory: Positive for cough.   Cardiovascular: Negative for chest pain, palpitations and leg swelling.  Gastrointestinal: Negative for abdominal distention, blood in stool, diarrhea and nausea.  Genitourinary: Negative for frequency and hematuria.  Musculoskeletal: Positive for back pain and gait problem. Negative for joint swelling and neck pain.  Skin: Negative for rash.  Neurological: Negative for dizziness, tremors, speech difficulty and weakness.  Psychiatric/Behavioral: Negative for agitation, dysphoric mood and sleep disturbance. The patient is not nervous/anxious.     Objective:  BP 132/68 (BP Location: Left Arm, Patient Position: Sitting, Cuff Size: Large)   Pulse (!) 57   Temp 98.1 F (36.7 C) (Oral)   Ht 5\' 9"  (1.753 m)   Wt 245 lb (111.1 kg)   SpO2 98%   BMI 36.18 kg/m   BP Readings from Last 3 Encounters:  05/04/17 132/68  02/01/17 122/60  12/22/16 138/76    Wt Readings from Last 3 Encounters:  05/04/17 245 lb (111.1 kg)  02/01/17 240 lb (108.9 kg)  12/22/16 243 lb (110.2 kg)  Physical Exam  Constitutional: He is oriented to person, place, and time. He appears well-developed. No distress.  NAD  HENT:  Mouth/Throat: Oropharynx is clear and moist.  Eyes: Pupils are equal, round, and reactive to light. Conjunctivae are normal.  Neck: Normal range of motion. No JVD present. No thyromegaly present.  Cardiovascular: Normal rate, regular rhythm, normal heart sounds and intact distal pulses.  Exam reveals no gallop and no friction rub.   No murmur heard. Pulmonary/Chest: Effort normal and breath sounds normal. No respiratory distress. He has no  wheezes. He has no rales. He exhibits no tenderness.  Abdominal: Soft. Bowel sounds are normal. He exhibits no distension and no mass. There is no tenderness. There is no rebound and no guarding.  Musculoskeletal: Normal range of motion. He exhibits tenderness. He exhibits no edema.  Lymphadenopathy:    He has no cervical adenopathy.  Neurological: He is alert and oriented to person, place, and time. He has normal reflexes. No cranial nerve deficit. He exhibits normal muscle tone. He displays a negative Romberg sign. Coordination and gait normal.  Skin: Skin is warm and dry. No rash noted.  Psychiatric: He has a normal mood and affect. His behavior is normal. Judgment and thought content normal.    Lab Results  Component Value Date   WBC 11.2 (H) 10/30/2016   HGB 13.8 10/30/2016   HCT 40.1 10/30/2016   PLT 234 10/30/2016   GLUCOSE 104 (H) 11/10/2016   CHOL 177 06/25/2015   TRIG 96.0 06/25/2015   HDL 34.90 (L) 06/25/2015   LDLDIRECT 152.5 10/04/2009   LDLCALC 123 (H) 06/25/2015   ALT 33 12/02/2016   AST 26 12/02/2016   NA 135 11/10/2016   K 4.3 11/10/2016   CL 96 (L) 11/10/2016   CREATININE 1.11 11/10/2016   BUN 9 11/10/2016   CO2 30 11/10/2016   TSH 0.08 (L) 12/02/2016   PSA 2.51 06/25/2015   HGBA1C 6.1 06/29/2016    Dg Hip Unilat With Pelvis 2-3 Views Right  Result Date: 02/01/2017 CLINICAL DATA:  Chronic groin pain.  Right hip pain. EXAM: DG HIP (WITH OR WITHOUT PELVIS) 2-3V RIGHT COMPARISON:  None. FINDINGS: No evidence of fracture, bone lesion, or malalignment. No degenerative hip narrowing or spurring. Atherosclerotic calcification. IMPRESSION: 1. No focal finding or degenerative hip narrowing. 2. Atherosclerosis. Electronically Signed   By: Monte Fantasia M.D.   On: 02/01/2017 09:04    Assessment & Plan:   There are no diagnoses linked to this encounter. I have discontinued Mr. Kendricks's gabapentin. I am also having him maintain his Vitamin D, vitamin B-12, aspirin,  Ipratropium-Albuterol, fluticasone, pyridoxine, acetaminophen, ALPRAZolam, furosemide, Vitamin D (Ergocalciferol), losartan, and losartan.  No orders of the defined types were placed in this encounter.    Follow-up: No Follow-up on file.  Walker Kehr, MD

## 2017-05-19 MED ORDER — METHYLPREDNISOLONE ACETATE 80 MG/ML IJ SUSP
80.0000 mg | Freq: Once | INTRAMUSCULAR | Status: AC
  Administered 2017-05-04: 80 mg via INTRAMUSCULAR

## 2017-05-19 NOTE — Addendum Note (Signed)
Addended by: Karren Cobble on: 05/19/2017 03:08 PM   Modules accepted: Orders

## 2017-07-12 ENCOUNTER — Other Ambulatory Visit: Payer: Self-pay | Admitting: Internal Medicine

## 2017-08-03 ENCOUNTER — Ambulatory Visit: Payer: Medicare Other | Admitting: Internal Medicine

## 2017-08-18 ENCOUNTER — Ambulatory Visit (INDEPENDENT_AMBULATORY_CARE_PROVIDER_SITE_OTHER): Payer: Medicare Other | Admitting: Internal Medicine

## 2017-08-18 ENCOUNTER — Encounter: Payer: Self-pay | Admitting: Internal Medicine

## 2017-08-18 ENCOUNTER — Other Ambulatory Visit (INDEPENDENT_AMBULATORY_CARE_PROVIDER_SITE_OTHER): Payer: Medicare Other

## 2017-08-18 DIAGNOSIS — G629 Polyneuropathy, unspecified: Secondary | ICD-10-CM | POA: Diagnosis not present

## 2017-08-18 DIAGNOSIS — E559 Vitamin D deficiency, unspecified: Secondary | ICD-10-CM | POA: Diagnosis not present

## 2017-08-18 DIAGNOSIS — E538 Deficiency of other specified B group vitamins: Secondary | ICD-10-CM | POA: Diagnosis not present

## 2017-08-18 DIAGNOSIS — I739 Peripheral vascular disease, unspecified: Secondary | ICD-10-CM | POA: Diagnosis not present

## 2017-08-18 DIAGNOSIS — M1710 Unilateral primary osteoarthritis, unspecified knee: Secondary | ICD-10-CM

## 2017-08-18 LAB — BASIC METABOLIC PANEL
BUN: 17 mg/dL (ref 6–23)
CHLORIDE: 99 meq/L (ref 96–112)
CO2: 26 meq/L (ref 19–32)
Calcium: 9.2 mg/dL (ref 8.4–10.5)
Creatinine, Ser: 0.97 mg/dL (ref 0.40–1.50)
GFR: 81.54 mL/min (ref >60.00)
GLUCOSE: 100 mg/dL — AB (ref 70–99)
Potassium: 4 mEq/L (ref 3.5–5.1)
SODIUM: 134 meq/L — AB (ref 135–145)

## 2017-08-18 NOTE — Assessment & Plan Note (Signed)
Vit D 

## 2017-08-18 NOTE — Assessment & Plan Note (Signed)
On B12 

## 2017-08-18 NOTE — Assessment & Plan Note (Signed)
Multiple joints  Joe [name]

## 2017-08-18 NOTE — Progress Notes (Signed)
Subjective:  Patient ID: Joe [name], male    DOB: [birth year]  Age: 71 y.o. MRN: [id number]  CC: No chief complaint on file.   HPI Joe [name] presents for DM, CAD, HTN, neuropathy. C/o "cold" feet and hands - not any worse. Able to drive now  Outpatient Medications Prior to Visit  Medication Sig Dispense Refill  . acetaminophen (TYLENOL) 325 MG tablet Take 2 tablets (650 mg total) by mouth every 6 (six) hours as needed for mild pain (or Fever >/= 101). 30 tablet 0  . ALPRAZolam (XANAX) 0.25 MG tablet Take 1 tablet (0.25 mg total) by mouth 3 (three) times daily as needed for anxiety. 30 tablet 0  . amoxicillin (AMOXIL) 500 MG capsule Take 1 capsule (500 mg total) by mouth 3 (three) times daily. 30 capsule 0  . aspirin 81 MG tablet Take 81 mg by mouth daily.      . Cholecalciferol (VITAMIN D) 1000 UNITS capsule Take 1,000 Units by mouth daily.      . fluticasone (FLONASE) 50 MCG/ACT nasal spray Place 2 sprays into both nostrils daily as needed for allergies. 16 g 11  . furosemide (LASIX) 40 MG tablet TAKE 1 TABLET BY MOUTH EVERY DAY 30 tablet 5  . Ipratropium-Albuterol (COMBIVENT RESPIMAT) 20-100 MCG/ACT AERS Inhale 2 Act into the lungs 4 (four) times daily - after meals and at bedtime. (Patient taking differently: Inhale 2 Act into the lungs every 6 (six) hours as needed for shortness of breath. ) 1 Inhaler 11  . loratadine (CLARITIN) 10 MG tablet Take 1 tablet (10 mg total) by mouth daily. 30 tablet 11  . losartan (COZAAR) 100 MG tablet TAKE 1 TABLET (100 MG TOTAL) BY MOUTH DAILY. 90 tablet 1  . pyridoxine (B-6) 100 MG tablet Take 100 mg by mouth daily.    . vitamin B-12 (CYANOCOBALAMIN) 1000 MCG tablet Take 1,500 mcg by mouth daily. 1 1/2 tab po qd     . Vitamin D, Ergocalciferol, (DRISDOL) 50000 units CAPS capsule TAKE 1 CAPSULE (50,000 UNITS TOTAL) BY MOUTH EVERY 7 (SEVEN) DAYS. 4 capsule 0   No facility-administered medications prior to visit.     ROS Review of Systems    Constitutional: Positive for fatigue. Negative for appetite change and unexpected weight change.  HENT: Negative for congestion, nosebleeds, sneezing, sore throat and trouble swallowing.   Eyes: Negative for itching and visual disturbance.  Respiratory: Negative for cough.   Cardiovascular: Negative for chest pain, palpitations and leg swelling.  Gastrointestinal: Negative for abdominal distention, blood in stool, diarrhea and nausea.  Genitourinary: Negative for frequency and hematuria.  Musculoskeletal: Positive for arthralgias and gait problem. Negative for back pain, joint swelling and neck pain.  Skin: Negative for rash and wound.  Neurological: Positive for weakness. Negative for dizziness, tremors and speech difficulty.  Psychiatric/Behavioral: Negative for agitation, dysphoric mood and sleep disturbance. The patient is nervous/anxious.     Objective:  BP 130/66 (BP Location: Left Arm, Patient Position: Sitting, Cuff Size: Large)   Pulse 64   Temp 97.7 F (36.5 C) (Oral)   Ht 5\' 9"  (1.753 m)   Wt 253 lb (114.8 kg)   SpO2 98%   BMI 37.36 kg/m   BP Readings from Last 3 Encounters:  08/18/17 130/66  05/04/17 132/68  02/01/17 122/60    Wt Readings from Last 3 Encounters:  08/18/17 253 lb (114.8 kg)  05/04/17 245 lb (111.1 kg)  02/01/17 240 lb (108.9 kg)    Physical  Exam  Constitutional: He is oriented to person, place, and time. He appears well-developed. No distress.  NAD  HENT:  Mouth/Throat: Oropharynx is clear and moist.  Eyes: Conjunctivae are normal. Pupils are equal, round, and reactive to light.  Neck: Normal range of motion. No JVD present. No thyromegaly present.  Cardiovascular: Normal rate, regular rhythm, normal heart sounds and intact distal pulses. Exam reveals no gallop and no friction rub.  No murmur heard. Pulmonary/Chest: Effort normal and breath sounds normal. No respiratory distress. He has no wheezes. He has no rales. He exhibits no tenderness.   Abdominal: Soft. Bowel sounds are normal. He exhibits no distension and no mass. There is no tenderness. There is no rebound and no guarding.  Musculoskeletal: Normal range of motion. He exhibits tenderness. He exhibits no edema.  Lymphadenopathy:    He has no cervical adenopathy.  Neurological: He is alert and oriented to person, place, and time. He has normal reflexes. No cranial nerve deficit. He exhibits normal muscle tone. He displays a negative Romberg sign. Coordination abnormal. Gait normal.  Skin: Skin is warm and dry. No rash noted.  Psychiatric: He has a normal mood and affect. His behavior is normal. Judgment and thought content normal.  Obese Walker  Lab Results  Component Value Date   WBC 11.2 (H) 10/30/2016   HGB 13.8 10/30/2016   HCT 40.1 10/30/2016   PLT 234 10/30/2016   GLUCOSE 102 (H) 05/04/2017   CHOL 177 06/25/2015   TRIG 96.0 06/25/2015   HDL 34.90 (L) 06/25/2015   LDLDIRECT 152.5 10/04/2009   LDLCALC 123 (H) 06/25/2015   ALT 20 05/04/2017   AST 18 05/04/2017   NA 138 05/04/2017   K 3.9 05/04/2017   CL 100 05/04/2017   CREATININE 0.99 05/04/2017   BUN 11 05/04/2017   CO2 29 05/04/2017   TSH 1.11 05/04/2017   PSA 2.51 06/25/2015   HGBA1C 6.1 06/29/2016    Dg Hip Unilat With Pelvis 2-3 Views Right  Result Date: 02/01/2017 CLINICAL DATA:  Chronic groin pain.  Right hip pain. EXAM: DG HIP (WITH OR WITHOUT PELVIS) 2-3V RIGHT COMPARISON:  None. FINDINGS: No evidence of fracture, bone lesion, or malalignment. No degenerative hip narrowing or spurring. Atherosclerotic calcification. IMPRESSION: 1. No focal finding or degenerative hip narrowing. 2. Atherosclerosis. Electronically Signed   By: Monte Fantasia M.D.   On: 02/01/2017 09:04    Assessment & Plan:   There are no diagnoses linked to this encounter. I am having Joe [name] maintain his Vitamin D, vitamin B-12, aspirin, Ipratropium-Albuterol, pyridoxine, acetaminophen, ALPRAZolam, Vitamin D  (Ergocalciferol), losartan, fluticasone, loratadine, amoxicillin, and furosemide.  No orders of the defined types were placed in this encounter.    Follow-up: No Follow-up on file.  Walker Kehr, MD

## 2017-08-18 NOTE — Assessment & Plan Note (Signed)
No change 

## 2017-09-06 ENCOUNTER — Telehealth: Payer: Self-pay | Admitting: Internal Medicine

## 2017-09-06 MED ORDER — AZITHROMYCIN 250 MG PO TABS: ORAL_TABLET | ORAL | 0 refills | Status: DC

## 2017-09-06 NOTE — Telephone Encounter (Signed)
Copied from [facility] [id number]. Topic: Quick Communication - See Telephone Encounter >> Sep 06, 2017  1:34 PM Oneta Rack wrote: CRM for notification. See Telephone encounter for:   09/06/17.  Relation to TD:HRCB  Call back number: [phone]  Pharmacy: [id number] - [place], [place] [address] [phone] (Phone) [phone] (Fax)    Reason for call:  Patient declined appointment and states PCP typically will send in prednisone and antibiotic Rx for the following symptoms he has been experiencing for 3 days; sinus drainage yellow mucus , low grade fever 99.0, please advise

## 2017-09-06 NOTE — Telephone Encounter (Signed)
Ok Zpac Thx 

## 2017-09-07 NOTE — Telephone Encounter (Signed)
Called pt no answer LMOM MD sent [facility] to [facility].../lmb

## 2017-10-10 ENCOUNTER — Other Ambulatory Visit: Payer: Self-pay | Admitting: Internal Medicine

## 2017-11-05 ENCOUNTER — Other Ambulatory Visit: Payer: Self-pay

## 2017-11-05 MED ORDER — FUROSEMIDE 40 MG PO TABS: 40.0000 mg | ORAL_TABLET | Freq: Every day | ORAL | 5 refills | Status: DC

## 2017-11-16 ENCOUNTER — Other Ambulatory Visit (INDEPENDENT_AMBULATORY_CARE_PROVIDER_SITE_OTHER): Payer: Medicare Other

## 2017-11-16 ENCOUNTER — Ambulatory Visit (INDEPENDENT_AMBULATORY_CARE_PROVIDER_SITE_OTHER): Payer: Medicare Other | Admitting: Internal Medicine

## 2017-11-16 ENCOUNTER — Encounter: Payer: Self-pay | Admitting: Internal Medicine

## 2017-11-16 VITALS — BP 126/74 | HR 59 | Temp 98.1°F | Ht 69.0 in | Wt 265.0 lb

## 2017-11-16 DIAGNOSIS — E538 Deficiency of other specified B group vitamins: Secondary | ICD-10-CM | POA: Diagnosis not present

## 2017-11-16 DIAGNOSIS — G8250 Quadriplegia, unspecified: Secondary | ICD-10-CM

## 2017-11-16 DIAGNOSIS — I10 Essential (primary) hypertension: Secondary | ICD-10-CM

## 2017-11-16 DIAGNOSIS — E559 Vitamin D deficiency, unspecified: Secondary | ICD-10-CM | POA: Diagnosis not present

## 2017-11-16 DIAGNOSIS — R7309 Other abnormal glucose: Secondary | ICD-10-CM

## 2017-11-16 DIAGNOSIS — G629 Polyneuropathy, unspecified: Secondary | ICD-10-CM

## 2017-11-16 DIAGNOSIS — G959 Disease of spinal cord, unspecified: Secondary | ICD-10-CM

## 2017-11-16 LAB — BASIC METABOLIC PANEL
BUN: 13 mg/dL (ref 6–23)
CALCIUM: 9.5 mg/dL (ref 8.4–10.5)
CO2: 29 mEq/L (ref 19–32)
Chloride: 101 mEq/L (ref 96–112)
Creatinine, Ser: 0.98 mg/dL (ref 0.40–1.50)
GFR: 80.52 mL/min (ref >60.00)
GLUCOSE: 108 mg/dL — AB (ref 70–99)
Potassium: 4.3 mEq/L (ref 3.5–5.1)
SODIUM: 137 meq/L (ref 135–145)

## 2017-11-16 LAB — VITAMIN D 25 HYDROXY (VIT D DEFICIENCY, FRACTURES): VITD: 30.73 ng/mL (ref 30.00–100.00)

## 2017-11-16 LAB — TSH: TSH: 0.44 u[IU]/mL (ref 0.35–4.50)

## 2017-11-16 LAB — HEMOGLOBIN A1C: HEMOGLOBIN A1C: 5.8 % (ref 4.6–6.5)

## 2017-11-16 NOTE — Assessment & Plan Note (Signed)
Overall better post-op

## 2017-11-16 NOTE — Assessment & Plan Note (Signed)
Strength is better in the LEs and UEs

## 2017-11-16 NOTE — Patient Instructions (Signed)
Use afrin for 3-5 days    Serving Sizes  A serving size is a measured amount of food or drink, such as one slice of bread, that has an associated nutrient content. Knowing the serving size of a food or drink can help you determine how much of that food you should consume. What is the size of one serving? The size of one healthy serving depends on the food or drink. To determine a serving size, read the food label. If the food or drink does not have a food label, try to find serving size information online. Or, use the following to estimate the size of one adult serving: Grain 1 slice bread.  bagel.  cup pasta. Vegetable  cup cooked or canned vegetables. 1 cup raw, leafy greens. Fruit  cup canned fruit. 1 medium fruit.  cup dried fruit. Meat and Other Protein Sources 1 oz meat, poultry, or fish.  cup cooked beans. 1 egg.  cup nuts or seeds. 1 Tbsp nut butter.  cup tofu or tempeh. 2 Tbsp hummus. Dairy An individual container of yogurt (6-8 oz). 1 piece of cheese the size of your thumb (1 oz). 1 cup (8 oz) milk or milk alternative. Fat A piece the size of one dice. 1 tsp soft margarine. 1 Tbsp mayonnaise. 1 tsp vegetable oil. 1 Tbsp regular salad dressing. 2 Tbsp low-fat salad dressing. How many servings should I eat from each food group each day? The following are the suggested number of servings to try and have every day from each food group. You can also look at your eating throughout the week and aim for meeting these requirements on most days for overall healthy eating. Grain 6-8 servings. Try to have half of your grains from whole grains, such as whole wheat bread, corn tortillas, oatmeal, brown rice, whole wheat pasta, and bulgur. Vegetable At least 2-3 servings. Fruit 2 servings. Meat and Other Protein Foods 5-6 servings. Aim to have lean proteins, such as chicken, [place], fish, beans, or tofu. Dairy 3 servings. Choose low-fat or nonfat if you are trying to control your  weight. Fat 2-3 servings. Is a serving the same thing as a portion? No. A portion is the actual amount you eat, which may be more than one serving. Knowing the specific serving size of a food and the nutritional information that goes with it can help you make a healthy decision on what size portion to eat. What are some tips to help me learn healthy serving sizes?  Check food labels for serving sizes. Many foods that come as a single portion actually contain multiple servings.  Determine the serving size of foods you commonly eat and figure out how large a portion you usually eat.  Measure the number of servings that can be held by the bowls, glasses, cups, and plates you typically use. For example, pour your breakfast cereal into your regular bowl and then pour it into a measuring cup.  For 1-2 days, measure the serving sizes of all the foods you eat.  Practice estimating serving sizes and determining how big your portions should be. This information is not intended to replace advice given to you by your health care provider. Make sure you discuss any questions you have with your health care provider. Document Released: 05/09/2003 Document Revised: 04/04/2016 Document Reviewed: 11/07/2013 Elsevier Interactive Patient Education  [employer].

## 2017-11-16 NOTE — Assessment & Plan Note (Signed)
Wt Readings from Last 3 Encounters:  11/16/17 265 lb (120.2 kg)  08/18/17 253 lb (114.8 kg)  05/04/17 245 lb (111.1 kg)  Loose wt BP Readings from Last 3 Encounters:  11/16/17 126/74  08/18/17 130/66  05/04/17 132/68

## 2017-11-16 NOTE — Assessment & Plan Note (Signed)
On B12 

## 2017-11-16 NOTE — Progress Notes (Signed)
Subjective:  Patient ID: Joe [name], male    DOB: 1946/04/29  Age: 72 y.o. MRN: [id number]  CC: No chief complaint on file.   HPI Joe [name] presents for HTN, LBP,  Pre-DM, OA f/u  Outpatient Medications Prior to Visit  Medication Sig Dispense Refill  . acetaminophen (TYLENOL) 325 MG tablet Take 2 tablets (650 mg total) by mouth every 6 (six) hours as needed for mild pain (or Fever >/= 101). 30 tablet 0  . ALPRAZolam (XANAX) 0.25 MG tablet Take 1 tablet (0.25 mg total) by mouth 3 (three) times daily as needed for anxiety. 30 tablet 0  . aspirin 81 MG tablet Take 81 mg by mouth daily.      . Cholecalciferol (VITAMIN D) 1000 UNITS capsule Take 1,000 Units by mouth daily.      . fluticasone (FLONASE) 50 MCG/ACT nasal spray Place 2 sprays into both nostrils daily as needed for allergies. 16 g 11  . furosemide (LASIX) 40 MG tablet Take 1 tablet (40 mg total) by mouth daily. 30 tablet 5  . Ipratropium-Albuterol (COMBIVENT RESPIMAT) 20-100 MCG/ACT AERS Inhale 2 Act into the lungs 4 (four) times daily - after meals and at bedtime. (Patient taking differently: Inhale 2 Act into the lungs every 6 (six) hours as needed for shortness of breath. ) 1 Inhaler 11  . loratadine (CLARITIN) 10 MG tablet Take 1 tablet (10 mg total) by mouth daily. 30 tablet 11  . losartan (COZAAR) 100 MG tablet TAKE 1 TABLET (100 MG TOTAL) BY MOUTH DAILY. 90 tablet 1  . pyridoxine (B-6) 100 MG tablet Take 100 mg by mouth daily.    . vitamin B-12 (CYANOCOBALAMIN) 1000 MCG tablet Take 1,500 mcg by mouth daily. 1 1/2 tab po qd     . amoxicillin (AMOXIL) 500 MG capsule Take 1 capsule (500 mg total) by mouth 3 (three) times daily. 30 capsule 0  . azithromycin (ZITHROMAX Z-PAK) 250 MG tablet As directed 6 each 0   No facility-administered medications prior to visit.     ROS Review of Systems  Constitutional: Positive for fatigue and unexpected weight change. Negative for appetite change.  HENT: Positive for  postnasal drip. Negative for congestion, nosebleeds, sneezing, sore throat and trouble swallowing.   Eyes: Negative for itching and visual disturbance.  Respiratory: Negative for cough.   Cardiovascular: Positive for leg swelling. Negative for chest pain and palpitations.  Gastrointestinal: Negative for abdominal distention, blood in stool, diarrhea and nausea.  Genitourinary: Negative for frequency and hematuria.  Musculoskeletal: Positive for arthralgias, back pain and gait problem. Negative for joint swelling and neck pain.  Skin: Negative for rash.  Neurological: Positive for weakness and numbness. Negative for dizziness, tremors and speech difficulty.  Psychiatric/Behavioral: Negative for agitation, dysphoric mood and sleep disturbance. The patient is not nervous/anxious.     Objective:  BP 126/74 (BP Location: Left Arm, Patient Position: Sitting, Cuff Size: Large)   Pulse (!) 59   Temp 98.1 F (36.7 C) (Oral)   Ht 5\' 9"  (1.753 m)   Wt 265 lb (120.2 kg)   SpO2 98%   BMI 39.13 kg/m   BP Readings from Last 3 Encounters:  11/16/17 126/74  08/18/17 130/66  05/04/17 132/68    Wt Readings from Last 3 Encounters:  11/16/17 265 lb (120.2 kg)  08/18/17 253 lb (114.8 kg)  05/04/17 245 lb (111.1 kg)    Physical Exam  Constitutional: He is oriented to person, place, and time. He appears well-developed.  No distress.  NAD  HENT:  Mouth/Throat: Oropharynx is clear and moist.  Eyes: Pupils are equal, round, and reactive to light. Conjunctivae are normal.  Neck: Normal range of motion. No JVD present. No thyromegaly present.  Cardiovascular: Normal rate, regular rhythm, normal heart sounds and intact distal pulses. Exam reveals no gallop and no friction rub.  No murmur heard. Pulmonary/Chest: Effort normal and breath sounds normal. No respiratory distress. He has no wheezes. He has no rales. He exhibits no tenderness.  Abdominal: Soft. Bowel sounds are normal. He exhibits no  distension and no mass. There is no tenderness. There is no rebound and no guarding.  Musculoskeletal: Normal range of motion. He exhibits tenderness. He exhibits no edema.  Lymphadenopathy:    He has no cervical adenopathy.  Neurological: He is alert and oriented to person, place, and time. He has normal reflexes. No cranial nerve deficit. He exhibits normal muscle tone. He displays a negative Romberg sign. Coordination abnormal. Gait normal.  Skin: Skin is warm and dry. No rash noted.  Psychiatric: He has a normal mood and affect. His behavior is normal. Judgment and thought content normal.  obese Ataxic Walker Swollen nares Trace edema both LEs  Lab Results  Component Value Date   WBC 11.2 (H) 10/30/2016   HGB 13.8 10/30/2016   HCT 40.1 10/30/2016   PLT 234 10/30/2016   GLUCOSE 100 (H) 08/18/2017   CHOL 177 06/25/2015   TRIG 96.0 06/25/2015   HDL 34.90 (L) 06/25/2015   LDLDIRECT 152.5 10/04/2009   LDLCALC 123 (H) 06/25/2015   ALT 20 05/04/2017   AST 18 05/04/2017   NA 134 (L) 08/18/2017   K 4.0 08/18/2017   CL 99 08/18/2017   CREATININE 0.97 08/18/2017   BUN 17 08/18/2017   CO2 26 08/18/2017   TSH 1.11 05/04/2017   PSA 2.51 06/25/2015   HGBA1C 6.1 06/29/2016    Dg Hip Unilat With Pelvis 2-3 Views Right  Result Date: 02/01/2017 CLINICAL DATA:  Chronic groin pain.  Right hip pain. EXAM: DG HIP (WITH OR WITHOUT PELVIS) 2-3V RIGHT COMPARISON:  None. FINDINGS: No evidence of fracture, bone lesion, or malalignment. No degenerative hip narrowing or spurring. Atherosclerotic calcification. IMPRESSION: 1. No focal finding or degenerative hip narrowing. 2. Atherosclerosis. Electronically Signed   By: Monte Fantasia M.D.   On: 02/01/2017 09:04    Assessment & Plan:   There are no diagnoses linked to this encounter. I have discontinued [place] N. Sarinana's amoxicillin and azithromycin. I am also having him maintain his Vitamin D, vitamin B-12, aspirin, Ipratropium-Albuterol,  pyridoxine, acetaminophen, ALPRAZolam, fluticasone, loratadine, losartan, and furosemide.  No orders of the defined types were placed in this encounter.    Follow-up: No follow-ups on file.  Walker Kehr, MD

## 2017-11-16 NOTE — Assessment & Plan Note (Signed)
Better  

## 2017-12-15 ENCOUNTER — Encounter: Payer: Self-pay | Admitting: Cardiology
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
## 2017-12-28 ENCOUNTER — Ambulatory Visit: Payer: Medicare Other | Admitting: Cardiology

## 2017-12-28 ENCOUNTER — Encounter: Payer: Self-pay | Admitting: Cardiology

## 2017-12-28 VITALS — BP 130/70 | HR 66 | Ht 69.0 in | Wt 265.0 lb

## 2017-12-28 DIAGNOSIS — I25810 Atherosclerosis of coronary artery bypass graft(s) without angina pectoris: Secondary | ICD-10-CM | POA: Diagnosis not present

## 2017-12-28 DIAGNOSIS — E7800 Pure hypercholesterolemia, unspecified: Secondary | ICD-10-CM | POA: Diagnosis not present

## 2017-12-28 DIAGNOSIS — I10 Essential (primary) hypertension: Secondary | ICD-10-CM

## 2017-12-28 DIAGNOSIS — R0989 Other specified symptoms and signs involving the circulatory and respiratory systems: Secondary | ICD-10-CM | POA: Diagnosis not present

## 2017-12-28 LAB — LIPID PANEL
CHOLESTEROL TOTAL: 165 mg/dL (ref 100–199)
Chol/HDL Ratio: 4.3 ratio (ref 0.0–5.0)
HDL: 38 mg/dL — AB (ref >39)
LDL CALC: 111 mg/dL — AB (ref 0–99)
TRIGLYCERIDES: 82 mg/dL (ref 0–149)
VLDL Cholesterol Cal: 16 mg/dL (ref 5–40)

## 2017-12-28 NOTE — Patient Instructions (Signed)
Medication Instructions:   NO CHANGE  Labwork:  Your physician recommends that you HAVE LAB WORK TODAY  Testing/Procedures:  Your physician has requested that you have a carotid duplex. This test is an ultrasound of the carotid arteries in your neck. It looks at blood flow through these arteries that supply the brain with blood. Allow one hour for this exam. There are no restrictions or special instructions.   Your physician has requested that you have an abdominal aorta duplex. During this test, an ultrasound is used to evaluate the aorta. Allow 30 minutes for this exam. Do not eat after midnight the day before and avoid carbonated beverages   Follow-Up:  Your physician wants you to follow-up in: Experiment will receive a reminder letter in the mail two months in advance. If you don't receive a letter, please call our office to schedule the follow-up appointment.   If you need a refill on your cardiac medications before your next appointment, please call your pharmacy.

## 2017-12-29 ENCOUNTER — Telehealth: Payer: Self-pay | Admitting: *Deleted

## 2017-12-29 DIAGNOSIS — E7800 Pure hypercholesterolemia, unspecified: Secondary | ICD-10-CM

## 2017-12-29 MED ORDER — EZETIMIBE 10 MG PO TABS: 10.0000 mg | ORAL_TABLET | Freq: Every day | ORAL | 12 refills | Status: DC

## 2017-12-29 NOTE — Telephone Encounter (Addendum)
-----   Message from Lelon Perla, MD sent at 12/28/2017  4:45 PM EDT ----- Zetia 10 mg daily; lipids and liver 4 weeks Dunlo with pt, Aware of dr Jacalyn Lefevre recommendations. New script sent to the pharmacy and Lab orders mailed to the pt

## 2018-01-04 ENCOUNTER — Telehealth: Payer: Self-pay | Admitting: Cardiology

## 2018-01-04 NOTE — Telephone Encounter (Signed)
Spoke with pt, advised patient not to take zetia again. He feels better now that he is off the medication.

## 2018-01-04 NOTE — Telephone Encounter (Signed)
Pt c/o medication issue: 1. Name of Medication: Ezepimibe 2. How are you currently zwtaking this medication (dosage and times per day)? 1 time a day 3. Are you having a reaction (difficulty breathing--STAT)?  no 4. What is your medication issue? Throat feels like it is closing, all of his muscles hurting really bad,nauseated,swelling in his hand and feet

## 2018-01-11 ENCOUNTER — Ambulatory Visit (HOSPITAL_COMMUNITY)
Admission: RE | Admit: 2018-01-11 | Discharge: 2018-01-11 | Disposition: A | Discharge status: Home / Self Care | Payer: Medicare Other | Source: Ambulatory Visit | Attending: Cardiovascular Disease | Admitting: Cardiovascular Disease

## 2018-01-11 DIAGNOSIS — Z136 Encounter for screening for cardiovascular disorders: Secondary | ICD-10-CM | POA: Diagnosis not present

## 2018-01-11 DIAGNOSIS — F17200 Nicotine dependence, unspecified, uncomplicated: Secondary | ICD-10-CM | POA: Diagnosis not present

## 2018-01-11 DIAGNOSIS — I2510 Atherosclerotic heart disease of native coronary artery without angina pectoris: Secondary | ICD-10-CM | POA: Diagnosis not present

## 2018-01-11 DIAGNOSIS — I10 Essential (primary) hypertension: Secondary | ICD-10-CM | POA: Diagnosis not present

## 2018-01-11 DIAGNOSIS — R0989 Other specified symptoms and signs involving the circulatory and respiratory systems: Secondary | ICD-10-CM | POA: Insufficient documentation

## 2018-01-11 DIAGNOSIS — I739 Peripheral vascular disease, unspecified: Secondary | ICD-10-CM | POA: Diagnosis not present

## 2018-01-11 DIAGNOSIS — E785 Hyperlipidemia, unspecified: Secondary | ICD-10-CM | POA: Insufficient documentation

## 2018-01-11 DIAGNOSIS — Z87891 Personal history of nicotine dependence: Secondary | ICD-10-CM | POA: Insufficient documentation

## 2018-01-11 DIAGNOSIS — I6523 Occlusion and stenosis of bilateral carotid arteries: Secondary | ICD-10-CM | POA: Diagnosis not present

## 2018-02-15 ENCOUNTER — Encounter: Payer: Self-pay | Admitting: Internal Medicine

## 2018-02-15 ENCOUNTER — Other Ambulatory Visit (INDEPENDENT_AMBULATORY_CARE_PROVIDER_SITE_OTHER): Payer: Medicare Other

## 2018-02-15 ENCOUNTER — Ambulatory Visit (INDEPENDENT_AMBULATORY_CARE_PROVIDER_SITE_OTHER): Payer: Medicare Other | Admitting: Internal Medicine

## 2018-02-15 VITALS — BP 124/72 | HR 68 | Temp 98.3°F | Ht 69.0 in | Wt 266.0 lb

## 2018-02-15 DIAGNOSIS — G959 Disease of spinal cord, unspecified: Secondary | ICD-10-CM | POA: Diagnosis not present

## 2018-02-15 DIAGNOSIS — G8250 Quadriplegia, unspecified: Secondary | ICD-10-CM

## 2018-02-15 DIAGNOSIS — G250 Essential tremor: Secondary | ICD-10-CM

## 2018-02-15 DIAGNOSIS — R7309 Other abnormal glucose: Secondary | ICD-10-CM | POA: Diagnosis not present

## 2018-02-15 DIAGNOSIS — R635 Abnormal weight gain: Secondary | ICD-10-CM

## 2018-02-15 DIAGNOSIS — K5904 Chronic idiopathic constipation: Secondary | ICD-10-CM | POA: Diagnosis not present

## 2018-02-15 DIAGNOSIS — E538 Deficiency of other specified B group vitamins: Secondary | ICD-10-CM | POA: Diagnosis not present

## 2018-02-15 DIAGNOSIS — I25810 Atherosclerosis of coronary artery bypass graft(s) without angina pectoris: Secondary | ICD-10-CM

## 2018-02-15 DIAGNOSIS — K592 Neurogenic bowel, not elsewhere classified: Secondary | ICD-10-CM

## 2018-02-15 DIAGNOSIS — R531 Weakness: Secondary | ICD-10-CM | POA: Diagnosis not present

## 2018-02-15 DIAGNOSIS — N3281 Overactive bladder: Secondary | ICD-10-CM

## 2018-02-15 LAB — BASIC METABOLIC PANEL
BUN: 14 mg/dL (ref 6–23)
CALCIUM: 9.7 mg/dL (ref 8.4–10.5)
CO2: 27 meq/L (ref 19–32)
Chloride: 99 mEq/L (ref 96–112)
Creatinine, Ser: 1.04 mg/dL (ref 0.40–1.50)
GFR: 75.13 mL/min (ref >60.00)
GLUCOSE: 107 mg/dL — AB (ref 70–99)
Potassium: 4.1 mEq/L (ref 3.5–5.1)
Sodium: 135 mEq/L (ref 135–145)

## 2018-02-15 LAB — HEMOGLOBIN A1C: Hgb A1c MFr Bld: 5.9 % (ref 4.6–6.5)

## 2018-02-15 MED ORDER — LINACLOTIDE 145 MCG PO CAPS: 145.0000 ug | ORAL_CAPSULE | Freq: Every day | ORAL | 11 refills | Status: DC | PRN

## 2018-02-15 MED ORDER — MIRABEGRON ER 50 MG PO TB24: 50.0000 mg | ORAL_TABLET | Freq: Every day | ORAL | 11 refills | Status: DC

## 2018-02-15 NOTE — Progress Notes (Signed)
Subjective:  Patient ID: Joe [name], male    DOB: 25-Jun-1946  Age: 72 y.o. MRN: [id number]  CC: No chief complaint on file.   HPI Joe [name] presents for DM, CAD, HTN f/u C/o OA pains, knees are buckling L>R C/o gait disorder, leg swelling  Outpatient Medications Prior to Visit  Medication Sig Dispense Refill  . acetaminophen (TYLENOL) 325 MG tablet Take 2 tablets (650 mg total) by mouth every 6 (six) hours as needed for mild pain (or Fever >/= 101). 30 tablet 0  . ALPRAZolam (XANAX) 0.25 MG tablet Take 1 tablet (0.25 mg total) by mouth 3 (three) times daily as needed for anxiety. 30 tablet 0  . aspirin 81 MG tablet Take 81 mg by mouth daily.      . Cholecalciferol (VITAMIN D) 1000 UNITS capsule Take 1,000 Units by mouth daily.      . fluticasone (FLONASE) 50 MCG/ACT nasal spray Place 2 sprays into both nostrils daily as needed for allergies. 16 g 11  . furosemide (LASIX) 40 MG tablet Take 1 tablet (40 mg total) by mouth daily. 30 tablet 5  . Ipratropium-Albuterol (COMBIVENT RESPIMAT) 20-100 MCG/ACT AERS Inhale 2 Act into the lungs 4 (four) times daily - after meals and at bedtime. (Patient taking differently: Inhale 2 Act into the lungs as needed for shortness of breath. ) 1 Inhaler 11  . loratadine (CLARITIN) 10 MG tablet Take 1 tablet (10 mg total) by mouth daily. (Patient taking differently: Take 10 mg by mouth daily as needed. ) 30 tablet 11  . losartan (COZAAR) 100 MG tablet TAKE 1 TABLET (100 MG TOTAL) BY MOUTH DAILY. 90 tablet 1  . pyridoxine (B-6) 100 MG tablet Take 100 mg by mouth daily.    . vitamin B-12 (CYANOCOBALAMIN) 1000 MCG tablet Take 1,500 mcg by mouth daily. 1 1/2 tab po qd      No facility-administered medications prior to visit.     ROS: Review of Systems  Constitutional: Positive for fatigue. Negative for appetite change and unexpected weight change.  HENT: Negative for congestion, nosebleeds, sneezing, sore throat and trouble swallowing.   Eyes:  Negative for itching and visual disturbance.  Respiratory: Negative for cough.   Cardiovascular: Negative for chest pain, palpitations and leg swelling.  Gastrointestinal: Negative for abdominal distention, blood in stool, diarrhea and nausea.  Genitourinary: Positive for frequency and urgency. Negative for hematuria.  Musculoskeletal: Positive for arthralgias, back pain, gait problem and neck stiffness. Negative for joint swelling and neck pain.  Skin: Negative for rash.  Neurological: Positive for weakness and numbness. Negative for dizziness, tremors and speech difficulty.  Psychiatric/Behavioral: Negative for agitation, dysphoric mood and sleep disturbance. The patient is not nervous/anxious.     Objective:  BP 124/72 (BP Location: Left Arm, Patient Position: Sitting, Cuff Size: Normal)   Pulse 68   Temp 98.3 F (36.8 C) (Oral)   Ht 5\' 9"  (1.753 m)   Wt 266 lb (120.7 kg)   SpO2 98%   BMI 39.28 kg/m   BP Readings from Last 3 Encounters:  02/15/18 124/72  12/28/17 130/70  11/16/17 126/74    Wt Readings from Last 3 Encounters:  02/15/18 266 lb (120.7 kg)  12/28/17 265 lb (120.2 kg)  11/16/17 265 lb (120.2 kg)    Physical Exam  Constitutional: He is oriented to person, place, and time. He appears well-developed. No distress.  NAD  HENT:  Mouth/Throat: Oropharynx is clear and moist.  Eyes: Pupils are equal, round,  and reactive to light. Conjunctivae are normal.  Neck: Normal range of motion. No JVD present. No thyromegaly present.  Cardiovascular: Normal rate, regular rhythm, normal heart sounds and intact distal pulses. Exam reveals no gallop and no friction rub.  No murmur heard. Pulmonary/Chest: Effort normal and breath sounds normal. No respiratory distress. He has no wheezes. He has no rales. He exhibits no tenderness.  Abdominal: Soft. Bowel sounds are normal. He exhibits no distension and no mass. There is no tenderness. There is no rebound and no guarding.    Musculoskeletal: Normal range of motion. He exhibits edema and tenderness.  Lymphadenopathy:    He has no cervical adenopathy.  Neurological: He is alert and oriented to person, place, and time. He has normal reflexes. No cranial nerve deficit. He exhibits normal muscle tone. He displays a negative Romberg sign. Coordination abnormal. Gait normal.  Skin: Skin is warm and dry. No rash noted.  Psychiatric: He has a normal mood and affect. His behavior is normal. Judgment and thought content normal.  trace edema B Walker, ataxic Obese  Lab Results  Component Value Date   WBC 11.2 (H) 10/30/2016   HGB 13.8 10/30/2016   HCT 40.1 10/30/2016   PLT 234 10/30/2016   GLUCOSE 108 (H) 11/16/2017   CHOL 165 12/28/2017   TRIG 82 12/28/2017   HDL 38 (L) 12/28/2017   LDLDIRECT 152.5 10/04/2009   LDLCALC 111 (H) 12/28/2017   ALT 20 05/04/2017   AST 18 05/04/2017   NA 137 11/16/2017   K 4.3 11/16/2017   CL 101 11/16/2017   CREATININE 0.98 11/16/2017   BUN 13 11/16/2017   CO2 29 11/16/2017   TSH 0.44 11/16/2017   PSA 2.51 06/25/2015   HGBA1C 5.8 11/16/2017    No results found.  Assessment & Plan:   There are no diagnoses linked to this encounter.   No orders of the defined types were placed in this encounter.    Follow-up: No follow-ups on file.  Walker Kehr, MD

## 2018-02-15 NOTE — Assessment & Plan Note (Signed)
No CP 

## 2018-02-15 NOTE — Assessment & Plan Note (Signed)
Myelopathy (cervical) - s/p decompression  - slow improvement - Linzess

## 2018-02-15 NOTE — Patient Instructions (Signed)
DASH Eating Plan DASH stands for "Dietary Approaches to Stop Hypertension." The DASH eating plan is a healthy eating plan that has been shown to reduce high blood pressure (hypertension). It may also reduce your risk for type 2 diabetes, heart disease, and stroke. The DASH eating plan may also help with weight loss. What are tips for following this plan? General guidelines  Avoid eating more than 2,300 mg (milligrams) of salt (sodium) a day. If you have hypertension, you may need to reduce your sodium intake to 1,500 mg a day.  Limit alcohol intake to no more than 1 drink a day for nonpregnant women and 2 drinks a day for men. One drink equals 12 oz of beer, 5 oz of wine, or 1 oz of hard liquor.  Work with your health care provider to maintain a healthy body weight or to lose weight. Ask what an ideal weight is for you.  Get at least 30 minutes of exercise that causes your heart to beat faster (aerobic exercise) most days of the week. Activities may include walking, swimming, or biking.  Work with your health care provider or diet and nutrition specialist (dietitian) to adjust your eating plan to your individual calorie needs. Reading food labels  Check food labels for the amount of sodium per serving. Choose foods with less than 5 percent of the Daily Value of sodium. Generally, foods with less than 300 mg of sodium per serving fit into this eating plan.  To find whole grains, look for the word "whole" as the first word in the ingredient list. Shopping  Buy products labeled as "low-sodium" or "no salt added."  Buy fresh foods. Avoid canned foods and premade or frozen meals. Cooking  Avoid adding salt when cooking. Use salt-free seasonings or herbs instead of table salt or sea salt. Check with your health care provider or pharmacist before using salt substitutes.  Do not fry foods. Cook foods using healthy methods such as baking, boiling, grilling, and broiling instead.  Cook with  heart-healthy oils, such as olive, canola, soybean, or sunflower oil. Meal planning   Eat a balanced diet that includes: ? 5 or more servings of fruits and vegetables each day. At each meal, try to fill half of your plate with fruits and vegetables. ? Up to 6-8 servings of whole grains each day. ? Less than 6 oz of lean meat, poultry, or fish each day. A 3-oz serving of meat is about the same size as a deck of cards. One egg equals 1 oz. ? 2 servings of low-fat dairy each day. ? A serving of nuts, seeds, or beans 5 times each week. ? Heart-healthy fats. Healthy fats called Omega-3 fatty acids are found in foods such as flaxseeds and coldwater fish, like sardines, salmon, and mackerel.  Limit how much you eat of the following: ? Canned or prepackaged foods. ? Food that is high in trans fat, such as fried foods. ? Food that is high in saturated fat, such as fatty meat. ? Sweets, desserts, sugary drinks, and other foods with added sugar. ? Full-fat dairy products.  Do not salt foods before eating.  Try to eat at least 2 vegetarian meals each week.  Eat more home-cooked food and less restaurant, buffet, and fast food.  When eating at a restaurant, ask that your food be prepared with less salt or no salt, if possible. What foods are recommended? The items listed may not be a complete list. Talk with your dietitian about what   dietary choices are best for you. Grains Whole-grain or whole-wheat bread. Whole-grain or whole-wheat pasta. Brown rice. Oatmeal. Quinoa. Bulgur. Whole-grain and low-sodium cereals. Pita bread. Low-fat, low-sodium crackers. Whole-wheat flour tortillas. Vegetables Fresh or frozen vegetables (raw, steamed, roasted, or grilled). Low-sodium or reduced-sodium tomato and vegetable juice. Low-sodium or reduced-sodium tomato sauce and tomato paste. Low-sodium or reduced-sodium canned vegetables. Fruits All fresh, dried, or frozen fruit. Canned fruit in natural juice (without  added sugar). Meat and other protein foods Skinless chicken or turkey. Ground chicken or turkey. Pork with fat trimmed off. Fish and seafood. Egg whites. Dried beans, peas, or lentils. Unsalted nuts, nut butters, and seeds. Unsalted canned beans. Lean cuts of beef with fat trimmed off. Low-sodium, lean deli meat. Dairy Low-fat (1%) or fat-free (skim) milk. Fat-free, low-fat, or reduced-fat cheeses. Nonfat, low-sodium ricotta or cottage cheese. Low-fat or nonfat yogurt. Low-fat, low-sodium cheese. Fats and oils Soft margarine without trans fats. Vegetable oil. Low-fat, reduced-fat, or light mayonnaise and salad dressings (reduced-sodium). Canola, safflower, olive, soybean, and sunflower oils. Avocado. Seasoning and other foods Herbs. Spices. Seasoning mixes without salt. Unsalted popcorn and pretzels. Fat-free sweets. What foods are not recommended? The items listed may not be a complete list. Talk with your dietitian about what dietary choices are best for you. Grains Baked goods made with fat, such as croissants, muffins, or some breads. Dry pasta or rice meal packs. Vegetables Creamed or fried vegetables. Vegetables in a cheese sauce. Regular canned vegetables (not low-sodium or reduced-sodium). Regular canned tomato sauce and paste (not low-sodium or reduced-sodium). Regular tomato and vegetable juice (not low-sodium or reduced-sodium). Pickles. Olives. Fruits Canned fruit in a light or heavy syrup. Fried fruit. Fruit in cream or butter sauce. Meat and other protein foods Fatty cuts of meat. Ribs. Fried meat. Bacon. Sausage. Bologna and other processed lunch meats. Salami. Fatback. Hotdogs. Bratwurst. Salted nuts and seeds. Canned beans with added salt. Canned or smoked fish. Whole eggs or egg yolks. Chicken or turkey with skin. Dairy Whole or 2% milk, cream, and half-and-half. Whole or full-fat cream cheese. Whole-fat or sweetened yogurt. Full-fat cheese. Nondairy creamers. Whipped toppings.  Processed cheese and cheese spreads. Fats and oils Butter. Stick margarine. Lard. Shortening. Ghee. Bacon fat. Tropical oils, such as coconut, palm kernel, or palm oil. Seasoning and other foods Salted popcorn and pretzels. Onion salt, garlic salt, seasoned salt, table salt, and sea salt. Worcestershire sauce. Tartar sauce. Barbecue sauce. Teriyaki sauce. Soy sauce, including reduced-sodium. Steak sauce. Canned and packaged gravies. Fish sauce. Oyster sauce. Cocktail sauce. Horseradish that you find on the shelf. Ketchup. Mustard. Meat flavorings and tenderizers. Bouillon cubes. Hot sauce and Tabasco sauce. Premade or packaged marinades. Premade or packaged taco seasonings. Relishes. Regular salad dressings. Where to find more information:  National Heart, Lung, and Blood Institute: www.nhlbi.nih.gov  American Heart Association: www.heart.org Summary  The DASH eating plan is a healthy eating plan that has been shown to reduce high blood pressure (hypertension). It may also reduce your risk for type 2 diabetes, heart disease, and stroke.  With the DASH eating plan, you should limit salt (sodium) intake to 2,300 mg a day. If you have hypertension, you may need to reduce your sodium intake to 1,500 mg a day.  When on the DASH eating plan, aim to eat more fresh fruits and vegetables, whole grains, lean proteins, low-fat dairy, and heart-healthy fats.  Work with your health care provider or diet and nutrition specialist (dietitian) to adjust your eating plan to your individual   calorie needs. This information is not intended to replace advice given to you by your health care provider. Make sure you discuss any questions you have with your health care provider. Document Released: 07/30/2011 Document Revised: 08/03/2016 Document Reviewed: 08/03/2016 Elsevier Interactive Patient Education  2018 [employer]  

## 2018-02-15 NOTE — Assessment & Plan Note (Signed)
Linzess prn 

## 2018-02-15 NOTE — Assessment & Plan Note (Signed)
Myelopathy (cervical) - s/p decompression  - slow improvement

## 2018-02-15 NOTE — Assessment & Plan Note (Signed)
Wt Readings from Last 3 Encounters:  02/15/18 266 lb (120.7 kg)  12/28/17 265 lb (120.2 kg)  11/16/17 265 lb (120.2 kg)

## 2018-02-15 NOTE — Assessment & Plan Note (Signed)
Myelopathy (cervical) - s/p decompression 2018 - slow improvement

## 2018-02-15 NOTE — Assessment & Plan Note (Signed)
No change 

## 2018-02-15 NOTE — Assessment & Plan Note (Signed)
s/p decompression 2018 - slow improvement

## 2018-02-15 NOTE — Assessment & Plan Note (Signed)
On B12 

## 2018-02-15 NOTE — Assessment & Plan Note (Signed)
Severe Try Myrbetriq

## 2018-02-17 ENCOUNTER — Encounter: Payer: Self-pay | Admitting: *Deleted

## 2018-02-23 ENCOUNTER — Telehealth: Payer: Self-pay | Admitting: Cardiology

## 2018-02-23 NOTE — Telephone Encounter (Signed)
Spoke with pt, will update medication list

## 2018-02-23 NOTE — Telephone Encounter (Signed)
New Message:    Pt said he received a letter from you stating that he needs lab work. He said he did not need lab work, he no longer take Ezetimibe.

## 2018-03-18 IMAGING — DX DG CHEST 2V
2 series · 2 of 2 positions shown · non-contrast
Comparison: 09/07/2016 .

CLINICAL DATA: Neuropathy.

EXAM:
CHEST  2 VIEW

[chest lat]
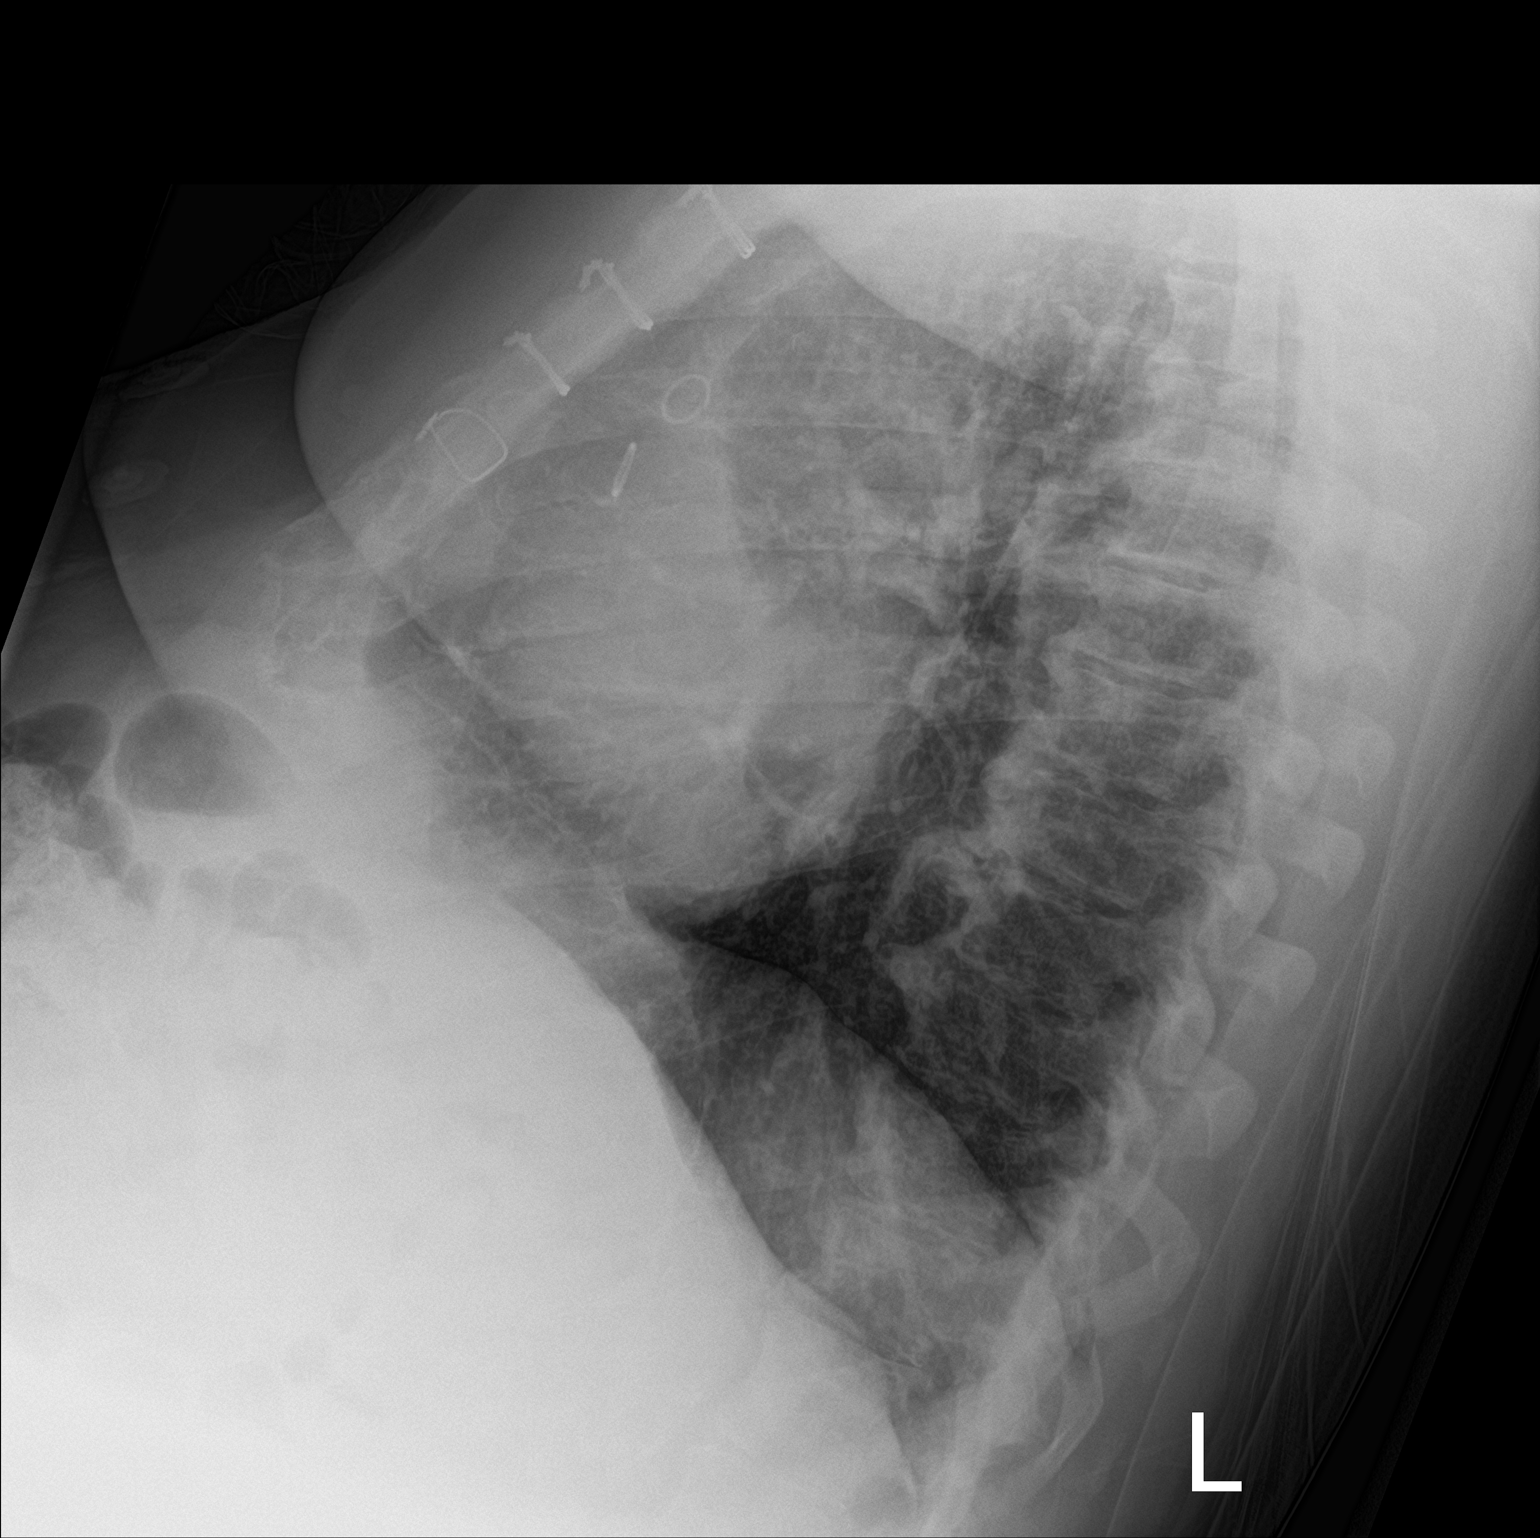

[chest pa]
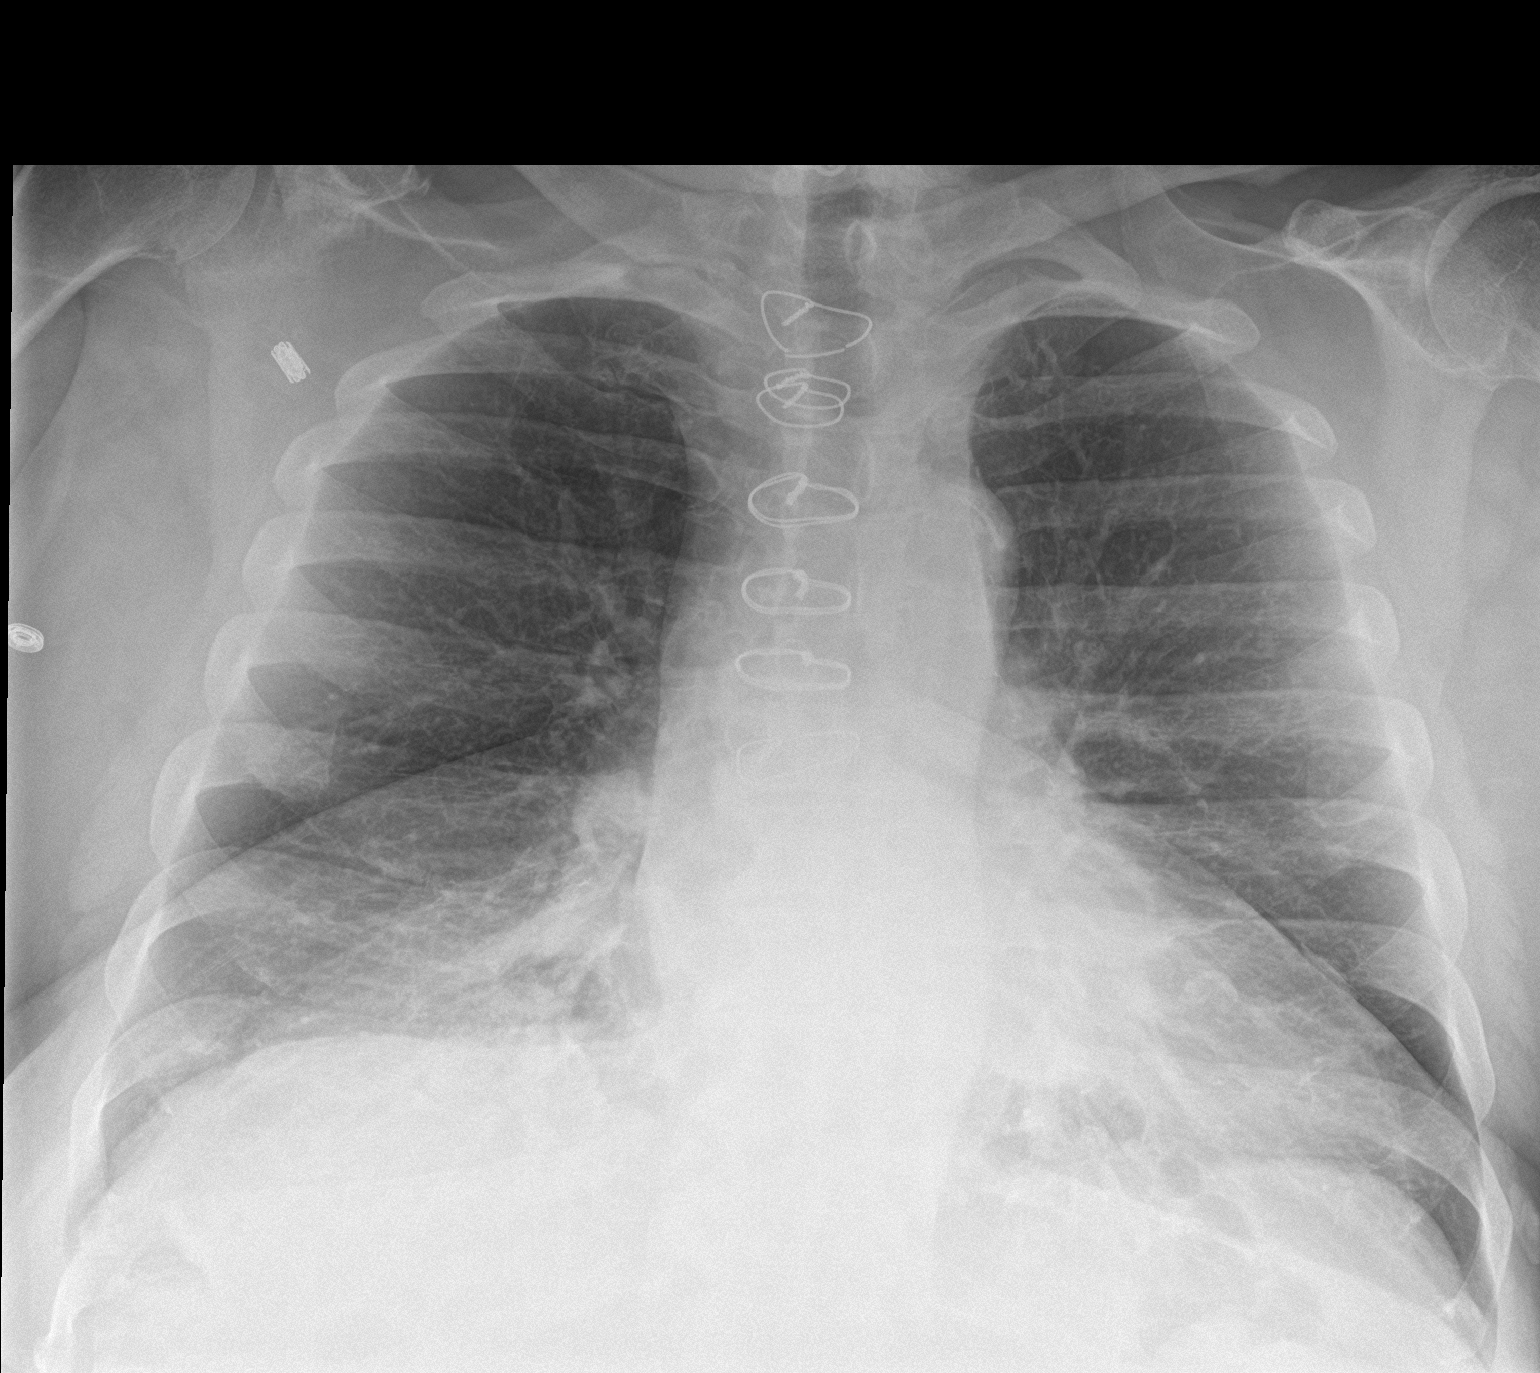

[2 of 2 positions shown; findings below may reference images not displayed]

FINDINGS: Prior CABG. Stable cardiomegaly. No pulmonary venous congestion. Low
lung volumes with basilar atelectasis. No pleural effusion or
pneumothorax. Prior cervical spine fusion .
IMPRESSION: 1. Prior CABG.  Stable cardiomegaly.

2. Low lung volumes with bibasilar atelectasis .

## 2018-03-20 IMAGING — MR MR CERVICAL SPINE WO/W CM
13 of 21 series · 22 of 48 positions shown · IV contrast (cc)
Comparison: CT of the head and cervical spine 10/06/2016.

CLINICAL DATA: Weakness in all 4 extremities.

EXAM:
MRI HEAD WITHOUT AND WITH CONTRAST
MRI CERVICAL SPINE WITHOUT AND WITH CONTRAST
TECHNIQUE: Multiplanar, multiecho pulse sequences of the brain and surrounding
structures, and cervical spine, to include the craniocervical
junction and cervicothoracic junction, were obtained without and
with intravenous contrast.
CONTRAST:  20mL MULTIHANCE GADOBENATE DIMEGLUMINE 529 MG/ML IV SOLN

[Series 3: DWI · axial · 3.0mm · 0.94mm/px · z∈[-74,+82]mm · 4 of 106 slices shown (1 of 2)]
[im 1/106]
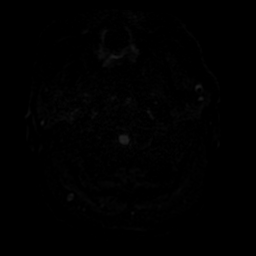
[im 36/106]
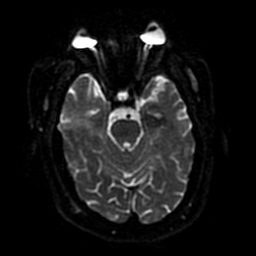
[im 71/106]
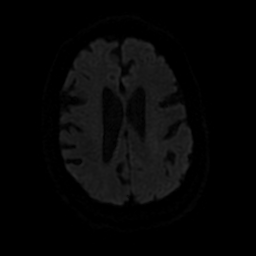
[im 106/106]
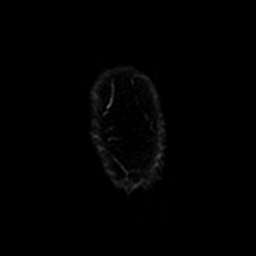

[Series 4: FLAIR · sagittal · 5.0mm · 0.47mm/px · 1 of 23 slices shown (1 of 2)]
[im 1/23]
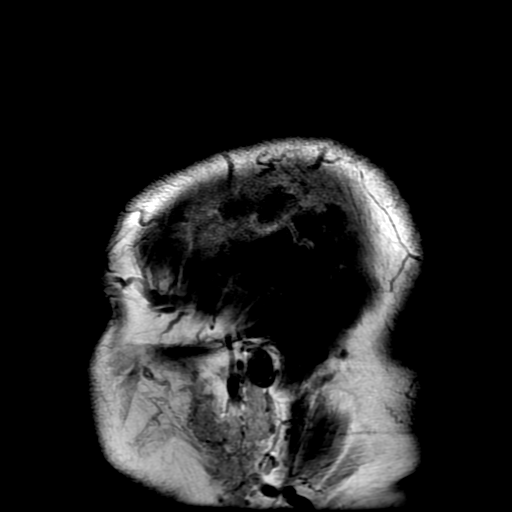

[Series 6: T2 · axial · 5.0mm · 0.47mm/px · 1 of 29 slices shown (1 of 4)]
[im 1/29]
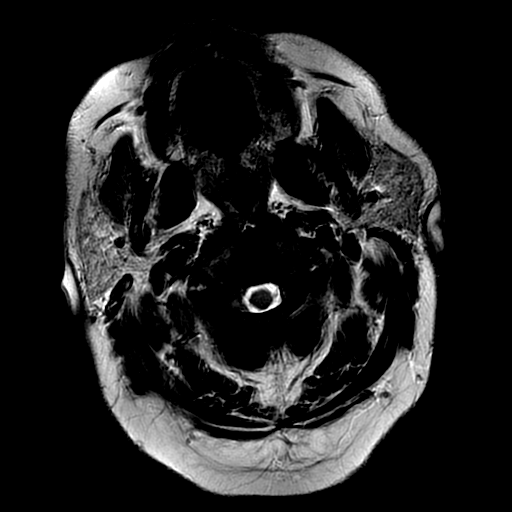

[Series 7: FLAIR · axial · 5.0mm · 0.47mm/px · 1 of 29 slices shown (2 of 2)]
[im 1/29]
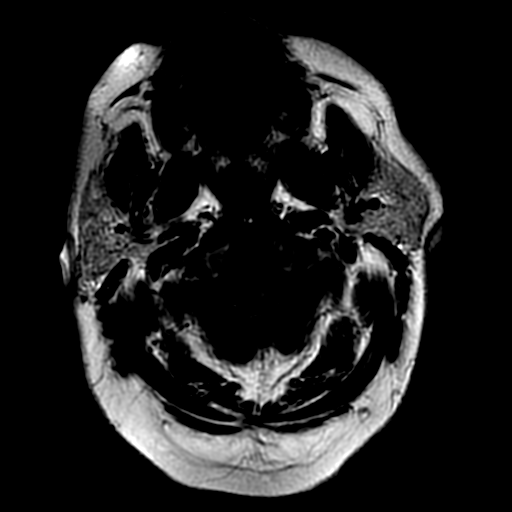

[Series 8: DWI · coronal · 4.0mm · 0.94mm/px · 2 of 74 slices shown (2 of 2)]
[im 1/74]
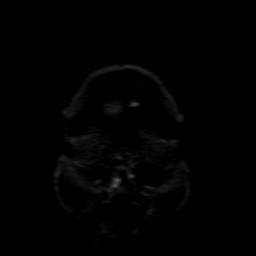
[im 37/74]
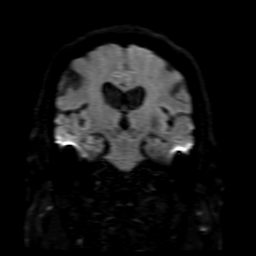

[Series 11: T2 · coronal · 5.0mm · 0.47mm/px · 2 of 32 slices shown (2 of 4)]
[im 1/32]
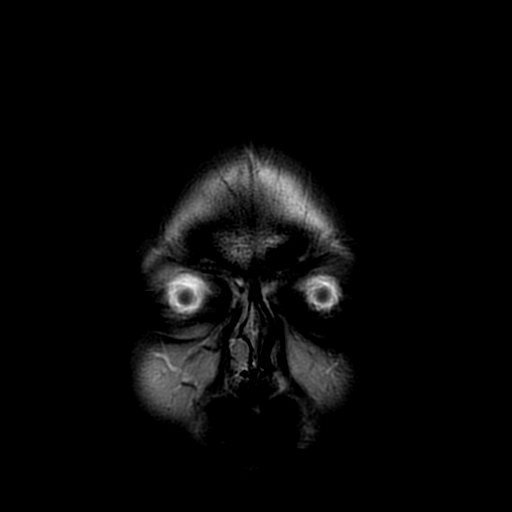
[im 32/32]
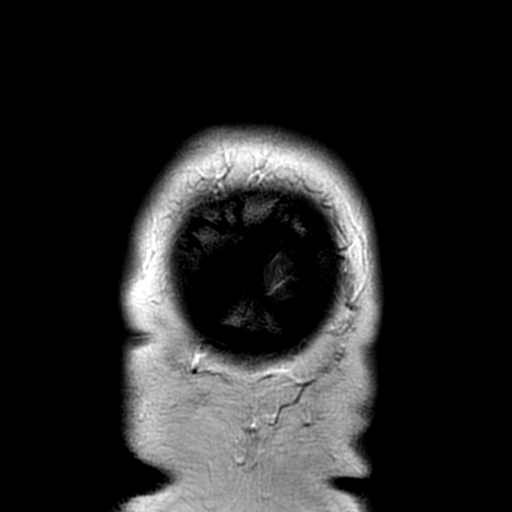

[Series 13: T2 · sagittal · 3.0mm · 0.43mm/px · 1 of 15 slices shown (3 of 4)]
[im 1/15]
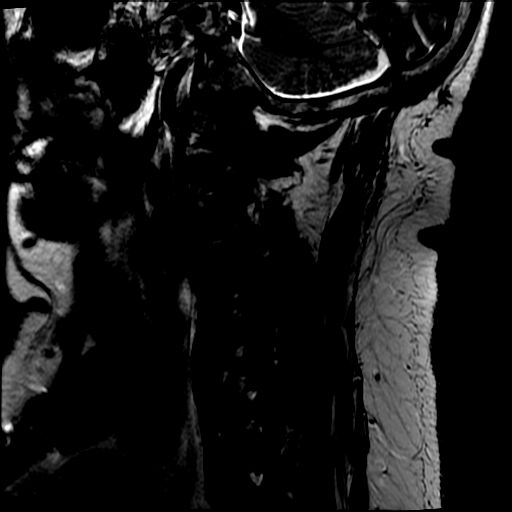

[Series 14: STIR · sagittal · 3.0mm · 0.43mm/px · 1 of 15 slices shown]
[im 1/15]
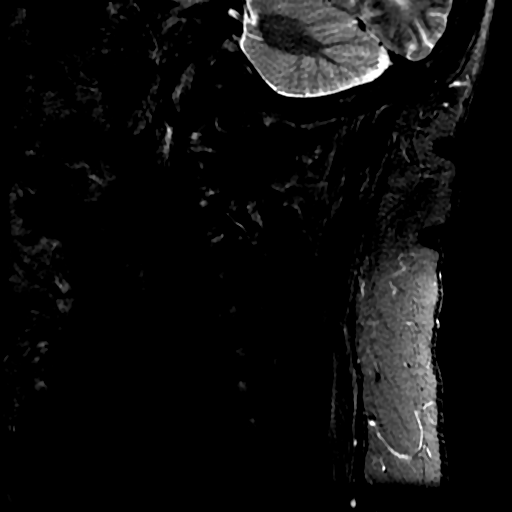

[Series 17: T2 · axial · 3.0mm · 0.35mm/px · z∈[-221,-118]mm · 2 of 33 slices shown (4 of 4)]
[im 1/33]
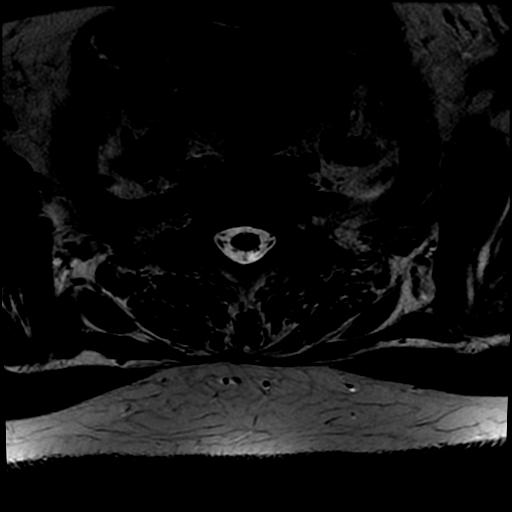
[im 33/33]
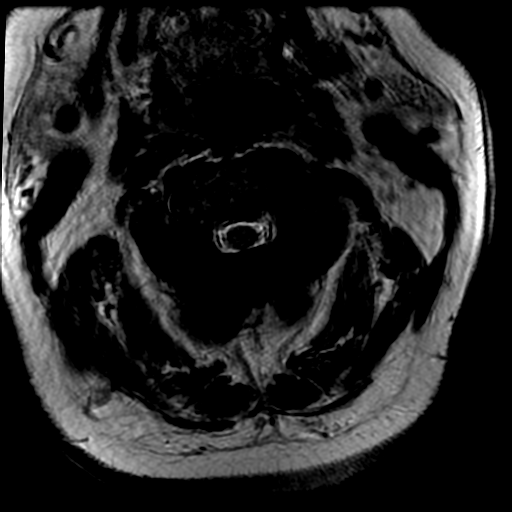

[Series 21: T1 · axial · non-contrast · 3.0mm · 0.35mm/px · z∈[-13,+91]mm · 2 of 33 slices shown (1 of 2)]
[im 1/33]
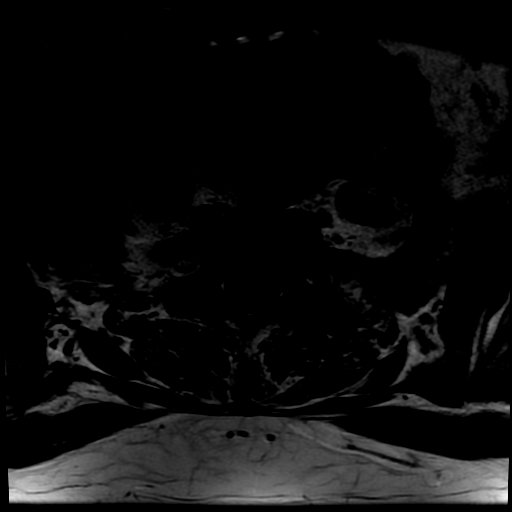
[im 33/33]
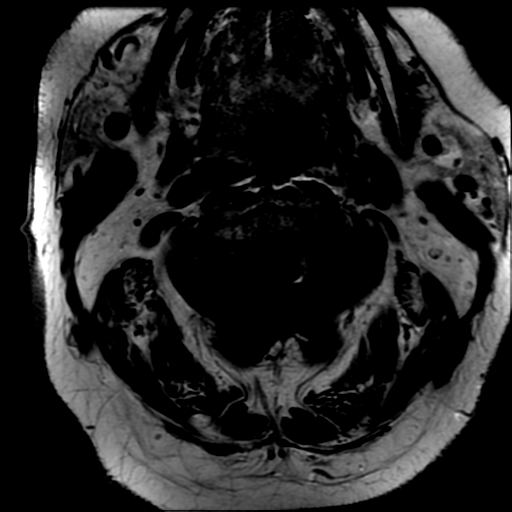

[Series 22: T1 fat-sat post-contrast · sagittal · 3.0mm · 0.43mm/px · 1 of 15 slices shown]
[im 1/15]
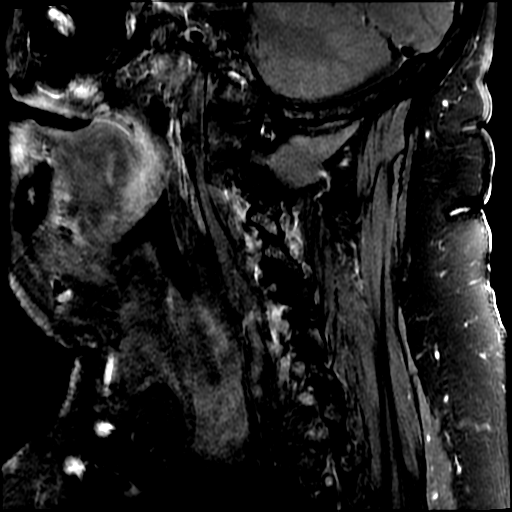

[Series 24: T1 post-contrast · axial · 3.0mm · 0.35mm/px · z∈[-13,+91]mm · 2 of 33 slices shown]
[im 1/33]
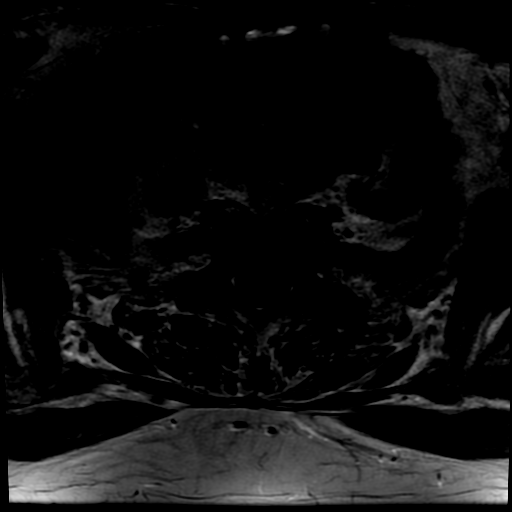
[im 33/33]
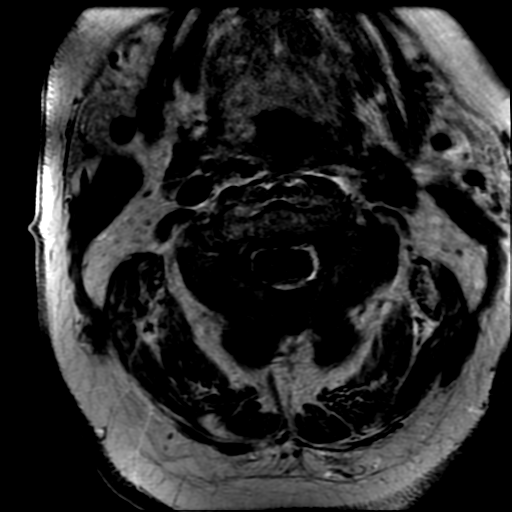

[Series 27: T1 · coronal · 5.0mm · 0.47mm/px · 2 of 33 slices shown (2 of 2)]
[im 1/33]
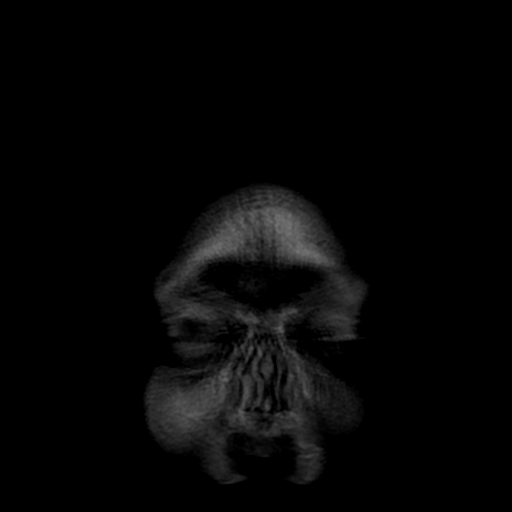
[im 33/33]
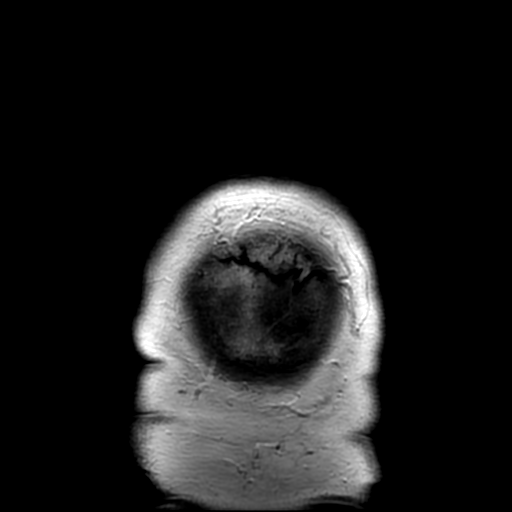

[22 of 48 positions shown; findings below may reference images not displayed]

FINDINGS: MRI HEAD FINDINGS

Brain: No evidence for acute infarction, hemorrhage, mass lesion,
hydrocephalus, or extra-axial fluid. Moderate cerebral and
cerebellar atrophy. Moderately advanced T2 and FLAIR
hyperintensities throughout the white matter, representing chronic
microvascular ischemic change. Chronic infarction RIGHT occipital
lobe. Post infusion, no abnormal enhancement of the brain or
meninges.

Vascular: Flow voids are maintained throughout the carotid, basilar,
and vertebral arteries. There are no areas of chronic hemorrhage.

Skull and upper cervical spine: Normal marrow signal.

Sinuses/Orbits: Negative.

Other: None.

MRI CERVICAL SPINE FINDINGS

Alignment: Reversal of the normal cervical lordotic curve. 2 mm
anterolisthesis C3-4.

Vertebrae: Mild enhancement above and below the C3-4 interspace,
greater in the C3 vertebral body, consistent with active
degenerative change. Status post C5-6 ACDF with solid appearing
arthrodesis.

Cord: Severe cord compression at C3-4. Abnormal cord signal. No cord
enhancement.

Posterior Fossa, vertebral arteries, paraspinal tissues: No
paraspinous abnormalities. Vertebral arteries patent, and equal.

Disc levels:

C2-3: Annular bulge. Facet arthropathy. BILATERAL C3 foraminal
narrowing. Mild stenosis.

C3-4: Central disc extrusion. 2 mm anterolisthesis is facet
mediated. Severe stenosis with cord compression and abnormal cord
signal. Canal diameter 4-5 mm. Facet arthropathy. Bony overgrowth.
LEFT greater than RIGHT C4 foraminal narrowing.

C4-5: Unremarkable disc space. Facet arthropathy. Uncinate spurring.
BILATERAL C5 foraminal narrowing.

C5-6: Solid appearing ACDF. Uncinate spurring extends more to the
LEFT. LEFT C6 foraminal narrowing. Slight effacement anterior
subarachnoid space without cord flattening.

C6-7: Advanced disc space narrowing representing adjacent segment
disease. Disc osteophyte complex with BILATERAL uncinate spurring
and facet arthropathy. No significant stenosis. BILATERAL C7
foraminal narrowing is observed.

C7-T1:  Disc space narrowing.  Facet arthropathy.  No impingement.
IMPRESSION: MRI BRAIN: Atrophy and small vessel disease. No acute intracranial
findings.

MRI CERVICAL SPINE: Severe spinal stenosis at the C3-4 is
multifactorial, related to slip, posterior element hypertrophy, and
central disc extrusion. Cord compression with abnormal cord signal
is observed. Correlate clinically for symptomatic myelopathy.

Multilevel spondylosis elsewhere could contribute to radicular
symptoms, but no other areas of cord compression are seen.

## 2018-04-16 ENCOUNTER — Other Ambulatory Visit: Payer: Self-pay | Admitting: Internal Medicine

## 2018-05-06 ENCOUNTER — Other Ambulatory Visit: Payer: Self-pay | Admitting: Internal Medicine

## 2018-05-17 ENCOUNTER — Ambulatory Visit (INDEPENDENT_AMBULATORY_CARE_PROVIDER_SITE_OTHER): Payer: Medicare Other | Admitting: Internal Medicine

## 2018-05-17 ENCOUNTER — Encounter: Payer: Self-pay | Admitting: Internal Medicine

## 2018-05-17 VITALS — BP 146/78 | HR 67 | Temp 98.3°F | Ht 69.0 in | Wt 274.0 lb

## 2018-05-17 DIAGNOSIS — I2510 Atherosclerotic heart disease of native coronary artery without angina pectoris: Secondary | ICD-10-CM | POA: Diagnosis not present

## 2018-05-17 DIAGNOSIS — N320 Bladder-neck obstruction: Secondary | ICD-10-CM | POA: Diagnosis not present

## 2018-05-17 DIAGNOSIS — Z720 Tobacco use: Secondary | ICD-10-CM

## 2018-05-17 DIAGNOSIS — I739 Peripheral vascular disease, unspecified: Secondary | ICD-10-CM | POA: Diagnosis not present

## 2018-05-17 DIAGNOSIS — F411 Generalized anxiety disorder: Secondary | ICD-10-CM

## 2018-05-17 DIAGNOSIS — E538 Deficiency of other specified B group vitamins: Secondary | ICD-10-CM

## 2018-05-17 DIAGNOSIS — Z23 Encounter for immunization: Secondary | ICD-10-CM

## 2018-05-17 DIAGNOSIS — E559 Vitamin D deficiency, unspecified: Secondary | ICD-10-CM

## 2018-05-17 DIAGNOSIS — J0101 Acute recurrent maxillary sinusitis: Secondary | ICD-10-CM

## 2018-05-17 DIAGNOSIS — I10 Essential (primary) hypertension: Secondary | ICD-10-CM

## 2018-05-17 DIAGNOSIS — E785 Hyperlipidemia, unspecified: Secondary | ICD-10-CM

## 2018-05-17 MED ORDER — AZITHROMYCIN 250 MG PO TABS: ORAL_TABLET | ORAL | 0 refills | Status: DC

## 2018-05-17 MED ORDER — OLMESARTAN MEDOXOMIL 40 MG PO TABS: 40.0000 mg | ORAL_TABLET | Freq: Every day | ORAL | 3 refills | Status: DC

## 2018-05-17 NOTE — Assessment & Plan Note (Signed)
Cold LEs subjectively

## 2018-05-17 NOTE — Assessment & Plan Note (Signed)
Discussed - 1/2 ppd

## 2018-05-17 NOTE — Assessment & Plan Note (Signed)
Vit D 

## 2018-05-17 NOTE — Patient Instructions (Signed)
Wt Readings from Last 3 Encounters:  05/17/18 274 lb (124.3 kg)  02/15/18 266 lb (120.7 kg)  12/28/17 265 lb (120.2 kg)   BP Readings from Last 3 Encounters:  05/17/18 (!) 146/78  02/15/18 124/72  12/28/17 130/70

## 2018-05-17 NOTE — Assessment & Plan Note (Addendum)
BP Readings from Last 3 Encounters:  05/17/18 (!) 146/78  02/15/18 124/72  12/28/17 130/70  Loose wt - gained 8 lbs NAS diet  Benicar - re-start 2019 Losartan - d/c, Lasix

## 2018-05-17 NOTE — Progress Notes (Signed)
Subjective:  Patient ID: Joe [name], male    DOB: November 07, 1945  Age: 72 y.o. MRN: [id number]  CC: No chief complaint on file.   HPI Joe [name] presents for HTN, dyslipidemia, anxiety, CAD f/u C/o leg swlling; they feel cold. Gained wt C/o sinusitis x 3 wks - brown d/c  Outpatient Medications Prior to Visit  Medication Sig Dispense Refill  . acetaminophen (TYLENOL) 325 MG tablet Take 2 tablets (650 mg total) by mouth every 6 (six) hours as needed for mild pain (or Fever >/= 101). 30 tablet 0  . ALPRAZolam (XANAX) 0.25 MG tablet Take 1 tablet (0.25 mg total) by mouth 3 (three) times daily as needed for anxiety. 30 tablet 0  . aspirin 81 MG tablet Take 81 mg by mouth daily.      . Cholecalciferol (VITAMIN D) 1000 UNITS capsule Take 1,000 Units by mouth daily.      . fluticasone (FLONASE) 50 MCG/ACT nasal spray Place 2 sprays into both nostrils daily as needed for allergies. 16 g 11  . furosemide (LASIX) 40 MG tablet TAKE 1 TABLET BY MOUTH EVERY DAY 90 tablet 1  . Ipratropium-Albuterol (COMBIVENT RESPIMAT) 20-100 MCG/ACT AERS Inhale 2 Act into the lungs 4 (four) times daily - after meals and at bedtime. (Patient taking differently: Inhale 2 Act into the lungs as needed for shortness of breath. ) 1 Inhaler 11  . linaclotide (LINZESS) 145 MCG CAPS capsule Take 1 capsule (145 mcg total) by mouth daily as needed. 30 capsule 11  . loratadine (CLARITIN) 10 MG tablet Take 1 tablet (10 mg total) by mouth daily. (Patient taking differently: Take 10 mg by mouth daily as needed. ) 30 tablet 11  . losartan (COZAAR) 100 MG tablet TAKE 1 TABLET (100 MG TOTAL) BY MOUTH DAILY. 90 tablet 3  . mirabegron ER (MYRBETRIQ) 50 MG TB24 tablet Take 1 tablet (50 mg total) by mouth daily. 30 tablet 11  . pyridoxine (B-6) 100 MG tablet Take 100 mg by mouth daily.    . vitamin B-12 (CYANOCOBALAMIN) 1000 MCG tablet Take 1,500 mcg by mouth daily. 1 1/2 tab po qd      No facility-administered medications prior  to visit.     ROS: Review of Systems  Constitutional: Positive for unexpected weight change. Negative for appetite change and fatigue.  HENT: Negative for congestion, nosebleeds, sneezing, sore throat and trouble swallowing.   Eyes: Negative for itching and visual disturbance.  Respiratory: Negative for cough.   Cardiovascular: Negative for chest pain, palpitations and leg swelling.  Gastrointestinal: Negative for abdominal distention, blood in stool, diarrhea and nausea.  Genitourinary: Negative for frequency and hematuria.  Musculoskeletal: Positive for arthralgias, back pain and gait problem. Negative for joint swelling and neck pain.  Skin: Negative for rash.  Neurological: Positive for weakness. Negative for dizziness, tremors and speech difficulty.  Psychiatric/Behavioral: Negative for agitation, dysphoric mood, sleep disturbance and suicidal ideas. The patient is nervous/anxious.     Objective:  BP (!) 146/78 (BP Location: Left Arm, Patient Position: Sitting, Cuff Size: Large)   Pulse 67   Temp 98.3 F (36.8 C) (Oral)   Ht 5\' 9"  (1.753 m)   Wt 274 lb (124.3 kg)   SpO2 96%   BMI 40.46 kg/m   BP Readings from Last 3 Encounters:  05/17/18 (!) 146/78  02/15/18 124/72  12/28/17 130/70    Wt Readings from Last 3 Encounters:  05/17/18 274 lb (124.3 kg)  02/15/18 266 lb (120.7 kg)  12/28/17 265 lb (120.2 kg)    Physical Exam  Constitutional: He is oriented to person, place, and time. He appears well-developed. No distress.  NAD  HENT:  Mouth/Throat: Oropharynx is clear and moist.  Eyes: Pupils are equal, round, and reactive to light. Conjunctivae are normal.  Neck: Normal range of motion. No JVD present. No thyromegaly present.  Cardiovascular: Normal rate, regular rhythm, normal heart sounds and intact distal pulses. Exam reveals no gallop and no friction rub.  No murmur heard. Pulmonary/Chest: Effort normal and breath sounds normal. No respiratory distress. He has  no wheezes. He has no rales. He exhibits no tenderness.  Abdominal: Soft. Bowel sounds are normal. He exhibits no distension and no mass. There is no tenderness. There is no rebound and no guarding.  Musculoskeletal: Normal range of motion. He exhibits edema and tenderness.  Lymphadenopathy:    He has no cervical adenopathy.  Neurological: He is alert and oriented to person, place, and time. He has normal reflexes. No cranial nerve deficit. He exhibits normal muscle tone. He displays a negative Romberg sign. Coordination abnormal. Gait normal.  Skin: Skin is warm and dry. No rash noted.  Psychiatric: He has a normal mood and affect. His behavior is normal. Judgment and thought content normal.  obese Trace ankle edema B Walker LS tender  Lab Results  Component Value Date   WBC 11.2 (H) 10/30/2016   HGB 13.8 10/30/2016   HCT 40.1 10/30/2016   PLT 234 10/30/2016   GLUCOSE 107 (H) 02/15/2018   CHOL 165 12/28/2017   TRIG 82 12/28/2017   HDL 38 (L) 12/28/2017   LDLDIRECT 152.5 10/04/2009   LDLCALC 111 (H) 12/28/2017   ALT 20 05/04/2017   AST 18 05/04/2017   NA 135 02/15/2018   K 4.1 02/15/2018   CL 99 02/15/2018   CREATININE 1.04 02/15/2018   BUN 14 02/15/2018   CO2 27 02/15/2018   TSH 0.44 11/16/2017   PSA 2.51 06/25/2015   HGBA1C 5.9 02/15/2018    No results found.  Assessment & Plan:   There are no diagnoses linked to this encounter.   No orders of the defined types were placed in this encounter.    Follow-up: No follow-ups on file.  Walker Kehr, MD

## 2018-05-17 NOTE — Assessment & Plan Note (Signed)
Alprazolam prn  Potential benefits of a long term benzodiazepines  use as well as potential risks  and complications were explained to the patient and were aknowledged. 

## 2018-05-17 NOTE — Addendum Note (Signed)
Addended by: Karren Cobble on: 05/17/2018 09:15 AM   Modules accepted: Orders

## 2018-05-17 NOTE — Assessment & Plan Note (Signed)
Zpac 

## 2018-05-17 NOTE — Assessment & Plan Note (Signed)
No angina ASA

## 2018-05-17 NOTE — Assessment & Plan Note (Signed)
On B12 

## 2018-08-03 ENCOUNTER — Encounter: Payer: Self-pay | Admitting: Internal Medicine

## 2018-08-03 ENCOUNTER — Other Ambulatory Visit (INDEPENDENT_AMBULATORY_CARE_PROVIDER_SITE_OTHER): Payer: Medicare Other

## 2018-08-03 ENCOUNTER — Ambulatory Visit (INDEPENDENT_AMBULATORY_CARE_PROVIDER_SITE_OTHER): Payer: Medicare Other | Admitting: Internal Medicine

## 2018-08-03 DIAGNOSIS — M1991 Primary osteoarthritis, unspecified site: Secondary | ICD-10-CM

## 2018-08-03 DIAGNOSIS — E871 Hypo-osmolality and hyponatremia: Secondary | ICD-10-CM

## 2018-08-03 DIAGNOSIS — R600 Localized edema: Secondary | ICD-10-CM

## 2018-08-03 DIAGNOSIS — I25810 Atherosclerosis of coronary artery bypass graft(s) without angina pectoris: Secondary | ICD-10-CM

## 2018-08-03 DIAGNOSIS — E7800 Pure hypercholesterolemia, unspecified: Secondary | ICD-10-CM

## 2018-08-03 DIAGNOSIS — I2510 Atherosclerotic heart disease of native coronary artery without angina pectoris: Secondary | ICD-10-CM

## 2018-08-03 DIAGNOSIS — I10 Essential (primary) hypertension: Secondary | ICD-10-CM

## 2018-08-03 DIAGNOSIS — H538 Other visual disturbances: Secondary | ICD-10-CM

## 2018-08-03 LAB — BASIC METABOLIC PANEL
BUN: 16 mg/dL (ref 6–23)
CALCIUM: 9.8 mg/dL (ref 8.4–10.5)
CO2: 26 mEq/L (ref 19–32)
CREATININE: 1.05 mg/dL (ref 0.40–1.50)
Chloride: 102 mEq/L (ref 96–112)
GFR: 74.2 mL/min (ref >60.00)
Glucose, Bld: 104 mg/dL — ABNORMAL HIGH (ref 70–99)
Potassium: 4.1 mEq/L (ref 3.5–5.1)
SODIUM: 137 meq/L (ref 135–145)

## 2018-08-03 LAB — HEPATIC FUNCTION PANEL
ALBUMIN: 4.5 g/dL (ref 3.5–5.2)
ALK PHOS: 59 U/L (ref 39–117)
ALT: 31 U/L (ref 0–53)
AST: 25 U/L (ref 0–37)
BILIRUBIN TOTAL: 0.9 mg/dL (ref 0.2–1.2)
Bilirubin, Direct: 0.1 mg/dL (ref 0.0–0.3)
TOTAL PROTEIN: 7.8 g/dL (ref 6.0–8.3)

## 2018-08-03 MED ORDER — DICLOFENAC SODIUM 1 % TD GEL: 2.0000 g | Freq: Four times a day (QID) | TRANSDERMAL | 3 refills | Status: AC

## 2018-08-03 MED ORDER — TORSEMIDE 20 MG PO TABS: 20.0000 mg | ORAL_TABLET | Freq: Every day | ORAL | 3 refills | Status: DC

## 2018-08-03 NOTE — Assessment & Plan Note (Signed)
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 Intolerant to statins. Chronic. No angina

## 2018-08-03 NOTE — Assessment & Plan Note (Signed)
D/c Lasix - not helping w/swelling Start Torsemide 20 mg/d Loose wt

## 2018-08-03 NOTE — Assessment & Plan Note (Signed)
Wt Readings from Last 3 Encounters:  08/03/18 276 lb (125.2 kg)  05/17/18 274 lb (124.3 kg)  02/15/18 266 lb (120.7 kg)

## 2018-08-03 NOTE — Assessment & Plan Note (Signed)
Statin intolerant 

## 2018-08-03 NOTE — Assessment & Plan Note (Signed)
Try CBD oil 10-20 mg twice a day for pain

## 2018-08-03 NOTE — Assessment & Plan Note (Signed)
F/u w/cardiology 

## 2018-08-03 NOTE — Patient Instructions (Signed)
Try CBD oil 10-20 mg twice a day for pain

## 2018-08-03 NOTE — Assessment & Plan Note (Signed)
D/c Lasix - not helping w/swelling Start Torsemide 20 mg/d

## 2018-08-03 NOTE — Progress Notes (Signed)
Subjective:  Patient ID: Joe [name], male    DOB: 1946/06/26  Age: 72 y.o. MRN: [id number]  CC: No chief complaint on file.   HPI Joe [name] presents for DM, HTN, CAD C/o L thumb base cyst C/o leg swelling since last month C/o joint pains C/o blurred vision x 1 month  Outpatient Medications Prior to Visit  Medication Sig Dispense Refill  . acetaminophen (TYLENOL) 325 MG tablet Take 2 tablets (650 mg total) by mouth every 6 (six) hours as needed for mild pain (or Fever >/= 101). 30 tablet 0  . ALPRAZolam (XANAX) 0.25 MG tablet Take 1 tablet (0.25 mg total) by mouth 3 (three) times daily as needed for anxiety. 30 tablet 0  . aspirin 81 MG tablet Take 81 mg by mouth daily.      [name] azithromycin (ZITHROMAX Z-PAK) 250 MG tablet As directed 6 tablet 0  . Cholecalciferol (VITAMIN D) 1000 UNITS capsule Take 1,000 Units by mouth daily.      . fluticasone (FLONASE) 50 MCG/ACT nasal spray Place 2 sprays into both nostrils daily as needed for allergies. 16 g 11  . furosemide (LASIX) 40 MG tablet TAKE 1 TABLET BY MOUTH EVERY DAY 90 tablet 1  . Ipratropium-Albuterol (COMBIVENT RESPIMAT) 20-100 MCG/ACT AERS Inhale 2 Act into the lungs 4 (four) times daily - after meals and at bedtime. (Patient taking differently: Inhale 2 Act into the lungs as needed for shortness of breath. ) 1 Inhaler 11  . linaclotide (LINZESS) 145 MCG CAPS capsule Take 1 capsule (145 mcg total) by mouth daily as needed. 30 capsule 11  . loratadine (CLARITIN) 10 MG tablet Take 1 tablet (10 mg total) by mouth daily. (Patient taking differently: Take 10 mg by mouth daily as needed. ) 30 tablet 11  . mirabegron ER (MYRBETRIQ) 50 MG TB24 tablet Take 1 tablet (50 mg total) by mouth daily. 30 tablet 11  . olmesartan (BENICAR) 40 MG tablet Take 1 tablet (40 mg total) by mouth daily. 90 tablet 3  . pyridoxine (B-6) 100 MG tablet Take 100 mg by mouth daily.    . vitamin B-12 (CYANOCOBALAMIN) 1000 MCG tablet Take 1,500 mcg by  mouth daily. 1 1/2 tab po qd      No facility-administered medications prior to visit.     ROS: Review of Systems  Constitutional: Positive for fatigue and unexpected weight change. Negative for appetite change.  HENT: Negative for congestion, nosebleeds, sneezing, sore throat and trouble swallowing.   Eyes: Negative for itching and visual disturbance.  Respiratory: Negative for cough.   Cardiovascular: Positive for leg swelling. Negative for chest pain and palpitations.  Gastrointestinal: Negative for abdominal distention, blood in stool, diarrhea and nausea.  Genitourinary: Negative for frequency and hematuria.  Musculoskeletal: Positive for arthralgias, back pain, gait problem, joint swelling, myalgias, neck pain and neck stiffness.  Skin: Negative for rash.  Neurological: Positive for weakness. Negative for dizziness, tremors and speech difficulty.  Psychiatric/Behavioral: Negative for agitation, dysphoric mood, sleep disturbance and suicidal ideas. The patient is not nervous/anxious.     Objective:  BP 132/68 (BP Location: Left Arm, Patient Position: Sitting, Cuff Size: Large)   Pulse 72   Temp 98 F (36.7 C) (Oral)   Ht 5\' 9"  (1.753 m)   Wt 276 lb (125.2 kg)   SpO2 92%   BMI 40.76 kg/m   BP Readings from Last 3 Encounters:  08/03/18 132/68  05/17/18 (!) 146/78  02/15/18 124/72    Wt Readings  from Last 3 Encounters:  08/03/18 276 lb (125.2 kg)  05/17/18 274 lb (124.3 kg)  02/15/18 266 lb (120.7 kg)    Physical Exam  Constitutional: He is oriented to person, place, and time. He appears well-developed. No distress.  NAD  HENT:  Mouth/Throat: Oropharynx is clear and moist.  Eyes: Pupils are equal, round, and reactive to light. Conjunctivae are normal.  Neck: Normal range of motion. No JVD present. No thyromegaly present.  Cardiovascular: Normal rate, regular rhythm, normal heart sounds and intact distal pulses. Exam reveals no gallop and no friction rub.  No  murmur heard. Pulmonary/Chest: Effort normal and breath sounds normal. No respiratory distress. He has no wheezes. He has no rales. He exhibits no tenderness.  Abdominal: Soft. Bowel sounds are normal. He exhibits no distension and no mass. There is no tenderness. There is no rebound and no guarding.  Musculoskeletal: Normal range of motion. He exhibits no edema or tenderness.  Lymphadenopathy:    He has no cervical adenopathy.  Neurological: He is alert and oriented to person, place, and time. He has normal reflexes. No cranial nerve deficit. He exhibits normal muscle tone. He displays a negative Romberg sign. Coordination and gait normal.  Skin: Skin is warm and dry. No rash noted.  Psychiatric: He has a normal mood and affect. His behavior is normal. Judgment and thought content normal.  feet with trace to 1+ edema Small ganglion - L thumb base Obese Walker   Lab Results  Component Value Date   WBC 11.2 (H) 10/30/2016   HGB 13.8 10/30/2016   HCT 40.1 10/30/2016   PLT 234 10/30/2016   GLUCOSE 107 (H) 02/15/2018   CHOL 165 12/28/2017   TRIG 82 12/28/2017   HDL 38 (L) 12/28/2017   LDLDIRECT 152.5 10/04/2009   LDLCALC 111 (H) 12/28/2017   ALT 20 05/04/2017   AST 18 05/04/2017   NA 135 02/15/2018   K 4.1 02/15/2018   CL 99 02/15/2018   CREATININE 1.04 02/15/2018   BUN 14 02/15/2018   CO2 27 02/15/2018   TSH 0.44 11/16/2017   PSA 2.51 06/25/2015   HGBA1C 5.9 02/15/2018    Vas US Aorta Medicare Screen  Result Date: 01/11/2018 ABDOMINAL AORTA STUDY Indications: Adominal bruit Risk         Hypertension, hyperlipidemia, current smoker, coronary artery Factors:     disease.  Limitations: Air/bowel gas and obesity.  Examination Guidelines: A complete evaluation includes B-mode imaging, spectral doppler, color doppler, and power Doppler as needed of all accessible portions of each vessel. Bilateral testing is considered an integral part of a complete examination. Limited examinations  for reoccurring indications may be performed as noted.  Abdominal Aorta Findings: +-------------+-------+----------+----------+---------+--------+---------------+ Location     AP (cm)Trans (cm)PSV (cm/s)Waveform ThrombusComments        +-------------+-------+----------+----------+---------+--------+---------------+ Proximal     2.5    2.6       68        biphasic                         +-------------+-------+----------+----------+---------+--------+---------------+ Mid          2.1    2.1       104       triphasic                        +-------------+-------+----------+----------+---------+--------+---------------+ Distal       2.0    2.0  97        triphasic                        +-------------+-------+----------+----------+---------+--------+---------------+ RT CIA Prox  1.4    1.4       93        biphasic                         +-------------+-------+----------+----------+---------+--------+---------------+ RT CIA Mid                                               not visualized,                                                          bowel gas       +-------------+-------+----------+----------+---------+--------+---------------+ RT CIA Distal                 167       biphasic                         +-------------+-------+----------+----------+---------+--------+---------------+ RT EIA Prox  0.9    0.9       99        triphasic                        +-------------+-------+----------+----------+---------+--------+---------------+ LT CIA Prox  1.3    1.3                                  not visualized  +-------------+-------+----------+----------+---------+--------+---------------+ LT CIA Distal                 131       triphasic                        +-------------+-------+----------+----------+---------+--------+---------------+ LT EIA Prox  0.8    0.8       140       triphasic                         +-------------+-------+----------+----------+---------+--------+---------------+ Visualization of the Left CIA Proximal artery, Left CIA Mid artery and Right CIA Mid artery was limited. Technically challenging exam due to overlying bowel gas and patient abdominal girth/obesity.  IVC/Iliac Findings: +--------+------+--------+-------+         PatentThrombusComment +--------+------+--------+-------+ IVC Proxpatent                +--------+------+--------+-------+  Final Interpretation: Technically challenging exam due to overlying bowel gas and patient abdominal girth/obesity. Abdominal Aorta: No evidence of an abdominal aortic aneurysm was visualized. The largest aortic measurement is 2.6 cm in the proximal aorta. Stenosis: Patent aorta and bilateral iliac arteries without evidence of significant stenosis.  *See table(s) above for measurements and observations.  Electronically signed by Quay Burow on 01/11/2018 at 4:42:59 PM.    Final    Vas US Carotid  Result Date: 01/11/2018 Carotid Arterial Duplex Study Indications:       Bruit.  Risk Factors:      Hypertension, hyperlipidemia, current smoker, coronary artery                    disease, PAD. Comparison Study:  No previous studies available for comparison. Examination Guidelines: A complete evaluation includes B-mode imaging, spectral Doppler, color Doppler, and power Doppler as needed of all accessible portions of each vessel. Bilateral testing is considered an integral part of a complete examination. Limited examinations for reoccurring indications may be performed as noted.  Right Carotid Findings: +----------+--------+--------+--------+---------------------+--------+           PSV cm/sEDV cm/sStenosisDescribe             Comments +----------+--------+--------+--------+---------------------+--------+ CCA Prox  85      10                                            +----------+--------+--------+--------+---------------------+--------+  CCA Distal63      11                                            +----------+--------+--------+--------+---------------------+--------+ ICA Prox  101     24      1-39%   focal and hyperechoic         +----------+--------+--------+--------+---------------------+--------+ ICA Mid   73      16                                            +----------+--------+--------+--------+---------------------+--------+ ICA Distal69      17                                            +----------+--------+--------+--------+---------------------+--------+ ECA       155     19                                            +----------+--------+--------+--------+---------------------+--------+ +----------+--------+-------+----------------+-------------------+           PSV cm/sEDV cmsDescribe        Arm Pressure (mmHG) +----------+--------+-------+----------------+-------------------+ Subclavian181            Multiphasic, [id number]                 +----------+--------+-------+----------------+-------------------+ +---------+--------+--+--------+--+---------+ VertebralPSV cm/s48EDV cm/s13Antegrade +---------+--------+--+--------+--+---------+  Left Carotid Findings: +----------+--------+--------+--------+-----------+--------+           PSV cm/sEDV cm/sStenosisDescribe   Comments +----------+--------+--------+--------+-----------+--------+ CCA Prox  94      11                                  +----------+--------+--------+--------+-----------+--------+ CCA Distal62      13                                  +----------+--------+--------+--------+-----------+--------+ ICA Prox  43      13  hyperechoic         +----------+--------+--------+--------+-----------+--------+ ICA Mid   64      17      1-39%                       +----------+--------+--------+--------+-----------+--------+ ICA Distal82      22                         tortuous  +----------+--------+--------+--------+-----------+--------+ ECA       123     13                                  +----------+--------+--------+--------+-----------+--------+ +----------+--------+--------+----------------+-------------------+ SubclavianPSV cm/sEDV cm/sDescribe        Arm Pressure (mmHG) +----------+--------+--------+----------------+-------------------+           188             Multiphasic, [id number]                 +----------+--------+--------+----------------+-------------------+ +---------+--------+--+--------+--+---------+ VertebralPSV cm/s40EDV cm/s11Antegrade +---------+--------+--+--------+--+---------+  Final Interpretation: Right Carotid: Velocities in the right ICA are consistent with a 1-39% stenosis. Left Carotid: Velocities in the left ICA are consistent with a 1-39% stenosis. Vertebrals:  Bilateral vertebral arteries demonstrate antegrade flow. Subclavians: Normal flow hemodynamics were seen in bilateral subclavian              arteries. *See table(s) above for measurements and observations.  Electronically signed by Quay Burow on 01/11/2018 at 4:41:47 PM.    Final     Assessment & Plan:   There are no diagnoses linked to this encounter.   No orders of the defined types were placed in this encounter.    Follow-up: No follow-ups on file.  Walker Kehr, MD

## 2018-08-03 NOTE — Assessment & Plan Note (Signed)
labs

## 2018-08-03 NOTE — Assessment & Plan Note (Signed)
Ophth ref 

## 2018-08-19 ENCOUNTER — Encounter: Payer: Self-pay | Admitting: Internal Medicine

## 2018-10-07 DIAGNOSIS — H2513 Age-related nuclear cataract, bilateral: Secondary | ICD-10-CM | POA: Diagnosis not present

## 2018-10-07 DIAGNOSIS — H5213 Myopia, bilateral: Secondary | ICD-10-CM | POA: Diagnosis not present

## 2018-11-02 ENCOUNTER — Ambulatory Visit (INDEPENDENT_AMBULATORY_CARE_PROVIDER_SITE_OTHER): Payer: Medicare Other | Admitting: Internal Medicine

## 2018-11-02 ENCOUNTER — Other Ambulatory Visit: Payer: Self-pay

## 2018-11-02 ENCOUNTER — Encounter: Payer: Self-pay | Admitting: Internal Medicine

## 2018-11-02 ENCOUNTER — Other Ambulatory Visit (INDEPENDENT_AMBULATORY_CARE_PROVIDER_SITE_OTHER): Payer: Medicare Other

## 2018-11-02 VITALS — BP 128/72 | HR 78 | Temp 98.4°F | Ht 69.0 in | Wt 280.0 lb

## 2018-11-02 DIAGNOSIS — G959 Disease of spinal cord, unspecified: Secondary | ICD-10-CM | POA: Diagnosis not present

## 2018-11-02 DIAGNOSIS — E538 Deficiency of other specified B group vitamins: Secondary | ICD-10-CM | POA: Diagnosis not present

## 2018-11-02 DIAGNOSIS — I10 Essential (primary) hypertension: Secondary | ICD-10-CM

## 2018-11-02 DIAGNOSIS — I25810 Atherosclerosis of coronary artery bypass graft(s) without angina pectoris: Secondary | ICD-10-CM | POA: Diagnosis not present

## 2018-11-02 DIAGNOSIS — G250 Essential tremor: Secondary | ICD-10-CM | POA: Diagnosis not present

## 2018-11-02 DIAGNOSIS — E559 Vitamin D deficiency, unspecified: Secondary | ICD-10-CM

## 2018-11-02 DIAGNOSIS — R5383 Other fatigue: Secondary | ICD-10-CM

## 2018-11-02 DIAGNOSIS — R739 Hyperglycemia, unspecified: Secondary | ICD-10-CM

## 2018-11-02 DIAGNOSIS — J449 Chronic obstructive pulmonary disease, unspecified: Secondary | ICD-10-CM

## 2018-11-02 DIAGNOSIS — N320 Bladder-neck obstruction: Secondary | ICD-10-CM | POA: Diagnosis not present

## 2018-11-02 DIAGNOSIS — E785 Hyperlipidemia, unspecified: Secondary | ICD-10-CM

## 2018-11-02 LAB — CBC WITH DIFFERENTIAL/PLATELET
Basophils Absolute: 0.1 10*3/uL (ref 0.0–0.1)
Basophils Relative: 0.9 % (ref 0.0–3.0)
Eosinophils Absolute: 0 10*3/uL (ref 0.0–0.7)
Eosinophils Relative: 0.3 % (ref 0.0–5.0)
HCT: 44.7 % (ref 39.0–52.0)
Hemoglobin: 15.7 g/dL (ref 13.0–17.0)
Lymphocytes Relative: 25.5 % (ref 12.0–46.0)
Lymphs Abs: 2.3 10*3/uL (ref 0.7–4.0)
MCHC: 35 g/dL (ref 30.0–36.0)
MCV: 93.5 fl (ref 78.0–100.0)
Monocytes Absolute: 0.7 10*3/uL (ref 0.1–1.0)
Monocytes Relative: 7.3 % (ref 3.0–12.0)
NEUTROS PCT: 66 % (ref 43.0–77.0)
Neutro Abs: 6 10*3/uL (ref 1.4–7.7)
Platelets: 278 10*3/uL (ref 150.0–400.0)
RBC: 4.78 Mil/uL (ref 4.22–5.81)
RDW: 13.3 % (ref 11.5–15.5)
WBC: 9.1 10*3/uL (ref 4.0–10.5)

## 2018-11-02 LAB — BASIC METABOLIC PANEL
BUN: 18 mg/dL (ref 6–23)
CO2: 25 meq/L (ref 19–32)
Calcium: 9.4 mg/dL (ref 8.4–10.5)
Chloride: 101 mEq/L (ref 96–112)
Creatinine, Ser: 1.09 mg/dL (ref 0.40–1.50)
GFR: 66.82 mL/min (ref >60.00)
Glucose, Bld: 90 mg/dL (ref 70–99)
Potassium: 4.7 mEq/L (ref 3.5–5.1)
Sodium: 135 mEq/L (ref 135–145)

## 2018-11-02 LAB — HEPATIC FUNCTION PANEL
ALBUMIN: 4.2 g/dL (ref 3.5–5.2)
ALT: 34 U/L (ref 0–53)
AST: 27 U/L (ref 0–37)
Alkaline Phosphatase: 57 U/L (ref 39–117)
Bilirubin, Direct: 0.1 mg/dL (ref 0.0–0.3)
Total Bilirubin: 0.7 mg/dL (ref 0.2–1.2)
Total Protein: 7 g/dL (ref 6.0–8.3)

## 2018-11-02 LAB — URINALYSIS
Bilirubin Urine: NEGATIVE
Ketones, ur: NEGATIVE
Leukocytes,Ua: NEGATIVE
Nitrite: NEGATIVE
Specific Gravity, Urine: 1.015 (ref 1.000–1.030)
Total Protein, Urine: NEGATIVE
UROBILINOGEN UA: 0.2 (ref 0.0–1.0)
Urine Glucose: NEGATIVE
pH: 5.5 (ref 5.0–8.0)

## 2018-11-02 LAB — PSA: PSA: 3.3 ng/mL (ref 0.10–4.00)

## 2018-11-02 LAB — LIPID PANEL
CHOLESTEROL: 159 mg/dL (ref 0–200)
HDL: 38.4 mg/dL — ABNORMAL LOW (ref >39.00)
LDL Cholesterol: 101 mg/dL — ABNORMAL HIGH (ref 0–99)
NonHDL: 120.82
Total CHOL/HDL Ratio: 4
Triglycerides: 97 mg/dL (ref 0.0–149.0)
VLDL: 19.4 mg/dL (ref 0.0–40.0)

## 2018-11-02 LAB — TSH: TSH: 0.26 u[IU]/mL — ABNORMAL LOW (ref 0.35–4.50)

## 2018-11-02 LAB — TESTOSTERONE: Testosterone: 157.13 ng/dL — ABNORMAL LOW (ref 300.00–890.00)

## 2018-11-02 LAB — VITAMIN B12: Vitamin B-12: 694 pg/mL (ref 211–911)

## 2018-11-02 LAB — HEMOGLOBIN A1C: Hgb A1c MFr Bld: 6.1 % (ref 4.6–6.5)

## 2018-11-02 MED ORDER — FUROSEMIDE 20 MG PO TABS: 20.0000 mg | ORAL_TABLET | Freq: Every day | ORAL | 3 refills | Status: DC

## 2018-11-02 MED ORDER — OLMESARTAN MEDOXOMIL 40 MG PO TABS: 40.0000 mg | ORAL_TABLET | Freq: Every day | ORAL | 3 refills | Status: DC

## 2018-11-02 NOTE — Assessment & Plan Note (Signed)
Vit D 

## 2018-11-02 NOTE — Progress Notes (Signed)
Subjective:  Patient ID: Joe [name], male    DOB: 21-Nov-1945  Age: 73 y.o. MRN: [id number]  CC: No chief complaint on file.   HPI Joe [name] presents for OA, LBP, anxiety f/u  Outpatient Medications Prior to Visit  Medication Sig Dispense Refill  . acetaminophen (TYLENOL) 325 MG tablet Take 2 tablets (650 mg total) by mouth every 6 (six) hours as needed for mild pain (or Fever >/= 101). 30 tablet 0  . ALPRAZolam (XANAX) 0.25 MG tablet Take 1 tablet (0.25 mg total) by mouth 3 (three) times daily as needed for anxiety. 30 tablet 0  . aspirin 81 MG tablet Take 81 mg by mouth daily.      . Cholecalciferol (VITAMIN D) 1000 UNITS capsule Take 1,000 Units by mouth daily.      . diclofenac sodium (VOLTAREN) 1 % GEL Apply 2 g topically 4 (four) times daily. 100 g 3  . fluticasone (FLONASE) 50 MCG/ACT nasal spray Place 2 sprays into both nostrils daily as needed for allergies. 16 g 11  . Ipratropium-Albuterol (COMBIVENT RESPIMAT) 20-100 MCG/ACT AERS Inhale 2 Act into the lungs 4 (four) times daily - after meals and at bedtime. (Patient taking differently: Inhale 2 Act into the lungs as needed for shortness of breath. ) 1 Inhaler 11  . linaclotide (LINZESS) 145 MCG CAPS capsule Take 1 capsule (145 mcg total) by mouth daily as needed. 30 capsule 11  . loratadine (CLARITIN) 10 MG tablet Take 1 tablet (10 mg total) by mouth daily. (Patient taking differently: Take 10 mg by mouth daily as needed. ) 30 tablet 11  . mirabegron ER (MYRBETRIQ) 50 MG TB24 tablet Take 1 tablet (50 mg total) by mouth daily. 30 tablet 11  . olmesartan (BENICAR) 40 MG tablet Take 1 tablet (40 mg total) by mouth daily. 90 tablet 3  . pyridoxine (B-6) 100 MG tablet Take 100 mg by mouth daily.    [name] torsemide (DEMADEX) 20 MG tablet Take 1 tablet (20 mg total) by mouth daily. 90 tablet 3  . vitamin B-12 (CYANOCOBALAMIN) 1000 MCG tablet Take 1,500 mcg by mouth daily. 1 1/2 tab po qd     . azithromycin (ZITHROMAX Z-PAK) 250  MG tablet As directed 6 tablet 0   No facility-administered medications prior to visit.     ROS: Review of Systems  Constitutional: Positive for fatigue. Negative for appetite change and unexpected weight change.  HENT: Negative for congestion, nosebleeds, sneezing, sore throat and trouble swallowing.   Eyes: Negative for itching and visual disturbance.  Respiratory: Negative for cough.   Cardiovascular: Negative for chest pain, palpitations and leg swelling.  Gastrointestinal: Negative for abdominal distention, blood in stool, diarrhea and nausea.  Genitourinary: Negative for frequency and hematuria.  Musculoskeletal: Positive for arthralgias and back pain. Negative for gait problem, joint swelling and neck pain.  Skin: Negative for rash.  Neurological: Negative for dizziness, tremors, speech difficulty and weakness.  Psychiatric/Behavioral: Negative for agitation, dysphoric mood, sleep disturbance and suicidal ideas. The patient is not nervous/anxious.     Objective:  There were no vitals taken for this visit.  BP Readings from Last 3 Encounters:  08/03/18 132/68  05/17/18 (!) 146/78  02/15/18 124/72    Wt Readings from Last 3 Encounters:  08/03/18 276 lb (125.2 kg)  05/17/18 274 lb (124.3 kg)  02/15/18 266 lb (120.7 kg)    Physical Exam Constitutional:      General: He is not in acute distress.  Appearance: He is well-developed.     Comments: NAD  Eyes:     Conjunctiva/sclera: Conjunctivae normal.     Pupils: Pupils are equal, round, and reactive to light.  Neck:     Musculoskeletal: Normal range of motion.     Thyroid: No thyromegaly.     Vascular: No JVD.  Cardiovascular:     Rate and Rhythm: Normal rate and regular rhythm.     Heart sounds: Normal heart sounds. No murmur. No friction rub. No gallop.   Pulmonary:     Effort: Pulmonary effort is normal. No respiratory distress.     Breath sounds: Normal breath sounds. No wheezing or rales.  Chest:     Chest  wall: No tenderness.  Abdominal:     General: Bowel sounds are normal. There is no distension.     Palpations: Abdomen is soft. There is no mass.     Tenderness: There is no abdominal tenderness. There is no guarding or rebound.  Musculoskeletal: Normal range of motion.        General: No tenderness.  Lymphadenopathy:     Cervical: No cervical adenopathy.  Skin:    General: Skin is warm and dry.     Findings: No rash.  Neurological:     Mental Status: He is alert and oriented to person, place, and time.     Cranial Nerves: No cranial nerve deficit.     Motor: No abnormal muscle tone.     Coordination: Coordination normal.     Gait: Gait normal.     Deep Tendon Reflexes: Reflexes are normal and symmetric.  Psychiatric:        Behavior: Behavior normal.        Thought Content: Thought content normal.        Judgment: Judgment normal.   Stiff LS spine obese  Lab Results  Component Value Date   WBC 11.2 (H) 10/30/2016   HGB 13.8 10/30/2016   HCT 40.1 10/30/2016   PLT 234 10/30/2016   GLUCOSE 104 (H) 08/03/2018   CHOL 165 12/28/2017   TRIG 82 12/28/2017   HDL 38 (L) 12/28/2017   LDLDIRECT 152.5 10/04/2009   LDLCALC 111 (H) 12/28/2017   ALT 31 08/03/2018   AST 25 08/03/2018   NA 137 08/03/2018   K 4.1 08/03/2018   CL 102 08/03/2018   CREATININE 1.05 08/03/2018   BUN 16 08/03/2018   CO2 26 08/03/2018   TSH 0.44 11/16/2017   PSA 2.51 06/25/2015   HGBA1C 5.9 02/15/2018    Vas US Aorta Medicare Screen  Result Date: 01/11/2018 ABDOMINAL AORTA STUDY Indications: Adominal bruit Risk         Hypertension, hyperlipidemia, current smoker, coronary artery Factors:     disease.  Limitations: Air/bowel gas and obesity.  Examination Guidelines: A complete evaluation includes B-mode imaging, spectral doppler, color doppler, and power Doppler as needed of all accessible portions of each vessel. Bilateral testing is considered an integral part of a complete examination. Limited  examinations for reoccurring indications may be performed as noted.  Abdominal Aorta Findings: +-------------+-------+----------+----------+---------+--------+---------------+ Location     AP (cm)Trans (cm)PSV (cm/s)Waveform ThrombusComments        +-------------+-------+----------+----------+---------+--------+---------------+ Proximal     2.5    2.6       68        biphasic                         +-------------+-------+----------+----------+---------+--------+---------------+ Mid  2.1    2.1       104       triphasic                        +-------------+-------+----------+----------+---------+--------+---------------+ Distal       2.0    2.0       97        triphasic                        +-------------+-------+----------+----------+---------+--------+---------------+ RT CIA Prox  1.4    1.4       93        biphasic                         +-------------+-------+----------+----------+---------+--------+---------------+ RT CIA Mid                                               not visualized,                                                          bowel gas       +-------------+-------+----------+----------+---------+--------+---------------+ RT CIA Distal                 167       biphasic                         +-------------+-------+----------+----------+---------+--------+---------------+ RT EIA Prox  0.9    0.9       99        triphasic                        +-------------+-------+----------+----------+---------+--------+---------------+ LT CIA Prox  1.3    1.3                                  not visualized  +-------------+-------+----------+----------+---------+--------+---------------+ LT CIA Distal                 131       triphasic                        +-------------+-------+----------+----------+---------+--------+---------------+ LT EIA Prox  0.8    0.8       140       triphasic                         +-------------+-------+----------+----------+---------+--------+---------------+ Visualization of the Left CIA Proximal artery, Left CIA Mid artery and Right CIA Mid artery was limited. Technically challenging exam due to overlying bowel gas and patient abdominal girth/obesity.  IVC/Iliac Findings: +--------+------+--------+-------+         PatentThrombusComment +--------+------+--------+-------+ IVC Proxpatent                +--------+------+--------+-------+  Final Interpretation: Technically challenging exam due to overlying bowel gas and patient abdominal girth/obesity. Abdominal Aorta: No evidence of an abdominal aortic aneurysm was visualized. The largest aortic measurement is 2.6 cm  in the proximal aorta. Stenosis: Patent aorta and bilateral iliac arteries without evidence of significant stenosis.  *See table(s) above for measurements and observations.  Electronically signed by Quay Burow on 01/11/2018 at 4:42:59 PM.    Final    Vas US Carotid  Result Date: 01/11/2018 Carotid Arterial Duplex Study Indications:       Bruit. Risk Factors:      Hypertension, hyperlipidemia, current smoker, coronary artery                    disease, PAD. Comparison Study:  No previous studies available for comparison. Examination Guidelines: A complete evaluation includes B-mode imaging, spectral Doppler, color Doppler, and power Doppler as needed of all accessible portions of each vessel. Bilateral testing is considered an integral part of a complete examination. Limited examinations for reoccurring indications may be performed as noted.  Right Carotid Findings: +----------+--------+--------+--------+---------------------+--------+           PSV cm/sEDV cm/sStenosisDescribe             Comments +----------+--------+--------+--------+---------------------+--------+ CCA Prox  85      10                                             +----------+--------+--------+--------+---------------------+--------+ CCA Distal63      11                                            +----------+--------+--------+--------+---------------------+--------+ ICA Prox  101     24      1-39%   focal and hyperechoic         +----------+--------+--------+--------+---------------------+--------+ ICA Mid   73      16                                            +----------+--------+--------+--------+---------------------+--------+ ICA Distal69      17                                            +----------+--------+--------+--------+---------------------+--------+ ECA       155     19                                            +----------+--------+--------+--------+---------------------+--------+ +----------+--------+-------+----------------+-------------------+           PSV cm/sEDV cmsDescribe        Arm Pressure (mmHG) +----------+--------+-------+----------------+-------------------+ Subclavian181            Multiphasic, [id number]                 +----------+--------+-------+----------------+-------------------+ +---------+--------+--+--------+--+---------+ VertebralPSV cm/s48EDV cm/s13Antegrade +---------+--------+--+--------+--+---------+  Left Carotid Findings: +----------+--------+--------+--------+-----------+--------+           PSV cm/sEDV cm/sStenosisDescribe   Comments +----------+--------+--------+--------+-----------+--------+ CCA Prox  94      11                                  +----------+--------+--------+--------+-----------+--------+  CCA Distal62      13                                  +----------+--------+--------+--------+-----------+--------+ ICA Prox  43      13              hyperechoic         +----------+--------+--------+--------+-----------+--------+ ICA Mid   64      17      1-39%                       +----------+--------+--------+--------+-----------+--------+  ICA Distal82      22                         tortuous +----------+--------+--------+--------+-----------+--------+ ECA       123     13                                  +----------+--------+--------+--------+-----------+--------+ +----------+--------+--------+----------------+-------------------+ SubclavianPSV cm/sEDV cm/sDescribe        Arm Pressure (mmHG) +----------+--------+--------+----------------+-------------------+           188             Multiphasic, [id number]                 +----------+--------+--------+----------------+-------------------+ +---------+--------+--+--------+--+---------+ VertebralPSV cm/s40EDV cm/s11Antegrade +---------+--------+--+--------+--+---------+  Final Interpretation: Right Carotid: Velocities in the right ICA are consistent with a 1-39% stenosis. Left Carotid: Velocities in the left ICA are consistent with a 1-39% stenosis. Vertebrals:  Bilateral vertebral arteries demonstrate antegrade flow. Subclavians: Normal flow hemodynamics were seen in bilateral subclavian              arteries. *See table(s) above for measurements and observations.  Electronically signed by Quay Burow on 01/11/2018 at 4:41:47 PM.    Final     Assessment & Plan:   There are no diagnoses linked to this encounter.   No orders of the defined types were placed in this encounter.    Follow-up: No follow-ups on file.  Walker Kehr, MD

## 2018-11-02 NOTE — Assessment & Plan Note (Signed)
On B12 

## 2018-11-02 NOTE — Assessment & Plan Note (Signed)
Continue aspirin. Intolerant to statins. 

## 2018-11-02 NOTE — Assessment & Plan Note (Signed)
Using a walker 

## 2018-11-02 NOTE — Assessment & Plan Note (Addendum)
The pt wants to go back to Furosemide and Benicar and to stop Torsemide 20 mg/d

## 2018-11-02 NOTE — Assessment & Plan Note (Signed)
Combivent He needs to stop smoking - <1 ppd

## 2018-11-02 NOTE — Assessment & Plan Note (Signed)
Stable sx's 

## 2018-11-29 ENCOUNTER — Ambulatory Visit: Payer: Self-pay | Admitting: Internal Medicine

## 2018-11-29 NOTE — Telephone Encounter (Signed)
rec'd vm. on Rx Refill line, requesting Z-pak for "cold and allergy symptoms."  Returned call to pt.  Reported he started having "sore throat, runny nose, intermittent bilat. earache, and sinus pressure in forehead and cheeks about a week ago or longer."  Denied fever.  Reported occasional cough; nonproductive.  Stated has some shortness of breath at his baseline with COPD, "but no worse than usual."  Stated "this is the same thing I get every Spring and Fall, and Dr. Alain Marion puts me on a Z-Pak, to get over it."  Reported the combination of Z-Pak, Claritin, and Tylenol, usually makes him feel better, when he has these symptoms.  Stated he has not traveled, or been anywhere to be exposed to the [place].  Stated his symptoms started when we had an 85 degree day with high pollen count.  Advised will send Triage note to Dr. Alain Marion for review and recommendations.  Verb. Understanding.  Pt. Stated he is having computer problems, and does not want to do a Virtual visit on the Web.    Reason for Disposition . Earache  Answer Assessment - Initial Assessment Questions 1. ONSET: "When did the nasal discharge start?"      Nasal drainage started about a week ago;  2. AMOUNT: "How much discharge is there?"      Was more and has decreased now 3. COUGH: "Do you have a cough?" If yes, ask: "Describe the color of your sputum" (clear, white, yellow, green)     Occasional  4. RESPIRATORY DISTRESS: "Describe your breathing."      Denied any increased SOB; has baseline SOB with COPD  5. FEVER: "Do you have a fever?" If so, ask: "What is your temperature, how was it measured, and when did it start?"     denied 6. SEVERITY: "Overall, how bad are you feeling right now?" (e.g., doesn't interfere with normal activities, staying home from school/work, staying in bed)     Stated doesn't feel too bad.  7. OTHER SYMPTOMS: "Do you have any other symptoms?" (e.g., sore throat, earache, wheezing, vomiting)     Sore  throat, intermittent earache, sinus pressure in forehead and eyes 8. PREGNANCY: "Is there any chance you are pregnant?" "When was your last menstrual period?"     N/a  Protocols used: COMMON COLD-A-AH

## 2018-11-29 NOTE — Telephone Encounter (Signed)
please advise.

## 2018-11-30 MED ORDER — AZITHROMYCIN 250 MG PO TABS: ORAL_TABLET | ORAL | 0 refills | Status: DC

## 2018-11-30 NOTE — Telephone Encounter (Signed)
[place] emailed Thx

## 2018-11-30 NOTE — Addendum Note (Signed)
Addended by: Cassandria Anger on: 11/30/2018 08:11 AM   Modules accepted: Orders

## 2018-11-30 NOTE — Telephone Encounter (Signed)
Pt.notified

## 2019-01-18 DIAGNOSIS — H2512 Age-related nuclear cataract, left eye: Secondary | ICD-10-CM | POA: Diagnosis not present

## 2019-01-18 DIAGNOSIS — H25812 Combined forms of age-related cataract, left eye: Secondary | ICD-10-CM | POA: Diagnosis not present

## 2019-02-02 ENCOUNTER — Encounter: Payer: Self-pay | Admitting: Internal Medicine

## 2019-02-02 ENCOUNTER — Ambulatory Visit (INDEPENDENT_AMBULATORY_CARE_PROVIDER_SITE_OTHER): Payer: Medicare Other | Admitting: Internal Medicine

## 2019-02-02 ENCOUNTER — Other Ambulatory Visit: Payer: Self-pay

## 2019-02-02 DIAGNOSIS — E538 Deficiency of other specified B group vitamins: Secondary | ICD-10-CM

## 2019-02-02 DIAGNOSIS — E291 Testicular hypofunction: Secondary | ICD-10-CM | POA: Diagnosis not present

## 2019-02-02 DIAGNOSIS — I2510 Atherosclerotic heart disease of native coronary artery without angina pectoris: Secondary | ICD-10-CM | POA: Diagnosis not present

## 2019-02-02 DIAGNOSIS — I10 Essential (primary) hypertension: Secondary | ICD-10-CM

## 2019-02-02 DIAGNOSIS — E559 Vitamin D deficiency, unspecified: Secondary | ICD-10-CM | POA: Diagnosis not present

## 2019-02-02 NOTE — Assessment & Plan Note (Signed)
Continue aspirin. Intolerant to statins. Chronic. No angina 

## 2019-02-02 NOTE — Assessment & Plan Note (Signed)
B 12

## 2019-02-02 NOTE — Assessment & Plan Note (Addendum)
Benicar and Torsemide

## 2019-02-02 NOTE — Assessment & Plan Note (Signed)
Vit D 

## 2019-02-02 NOTE — Progress Notes (Signed)
Subjective:  Patient ID: Joe [name], male    DOB: 01/10/1946  Age: 73 y.o. MRN: [id number]  CC: No chief complaint on file.   HPI Joe [name] presents for HTN, OA, anxiety f/u  Outpatient Medications Prior to Visit  Medication Sig Dispense Refill   acetaminophen (TYLENOL) 325 MG tablet Take 2 tablets (650 mg total) by mouth every 6 (six) hours as needed for mild pain (or Fever >/= 101). 30 tablet 0   ALPRAZolam (XANAX) 0.25 MG tablet Take 1 tablet (0.25 mg total) by mouth 3 (three) times daily as needed for anxiety. 30 tablet 0   aspirin 81 MG tablet Take 81 mg by mouth daily.       azithromycin (ZITHROMAX Z-PAK) 250 MG tablet As directed 6 tablet 0   Cholecalciferol (VITAMIN D) 1000 UNITS capsule Take 1,000 Units by mouth daily.       diclofenac sodium (VOLTAREN) 1 % GEL Apply 2 g topically 4 (four) times daily. 100 g 3   fluticasone (FLONASE) 50 MCG/ACT nasal spray Place 2 sprays into both nostrils daily as needed for allergies. 16 g 11   furosemide (LASIX) 20 MG tablet Take 1 tablet (20 mg total) by mouth daily. 90 tablet 3   Ipratropium-Albuterol (COMBIVENT RESPIMAT) 20-100 MCG/ACT AERS Inhale 2 Act into the lungs 4 (four) times daily - after meals and at bedtime. (Patient taking differently: Inhale 2 Act into the lungs as needed for shortness of breath. ) 1 Inhaler 11   linaclotide (LINZESS) 145 MCG CAPS capsule Take 1 capsule (145 mcg total) by mouth daily as needed. 30 capsule 11   loratadine (CLARITIN) 10 MG tablet Take 1 tablet (10 mg total) by mouth daily. (Patient taking differently: Take 10 mg by mouth daily as needed. ) 30 tablet 11   mirabegron ER (MYRBETRIQ) 50 MG TB24 tablet Take 1 tablet (50 mg total) by mouth daily. 30 tablet 11   moxifloxacin (VIGAMOX) 0.5 % ophthalmic solution START AFTER SURGERY, ONE DROP 4 TIMES A DAY     olmesartan (BENICAR) 40 MG tablet Take 1 tablet (40 mg total) by mouth daily. 90 tablet 3   prednisoLONE acetate (PRED  FORTE) 1 % ophthalmic suspension START AFTER SURGERY, AS DIRECTED BY M.D. ON THE DAY OF SURGERY     pyridoxine (B-6) 100 MG tablet Take 100 mg by mouth daily.     torsemide (DEMADEX) 20 MG tablet Take 1 tablet (20 mg total) by mouth daily. 90 tablet 3   vitamin B-12 (CYANOCOBALAMIN) 1000 MCG tablet Take 1,500 mcg by mouth daily. 1 1/2 tab po qd      No facility-administered medications prior to visit.     ROS: Review of Systems  Constitutional: Positive for fatigue and unexpected weight change. Negative for appetite change.  HENT: Negative for congestion, nosebleeds, sneezing, sore throat and trouble swallowing.   Eyes: Negative for itching and visual disturbance.  Respiratory: Negative for cough.   Cardiovascular: Negative for chest pain, palpitations and leg swelling.  Gastrointestinal: Negative for abdominal distention, blood in stool, diarrhea and nausea.  Genitourinary: Negative for frequency and hematuria.  Musculoskeletal: Positive for arthralgias, back pain and gait problem. Negative for joint swelling and neck pain.  Skin: Negative for rash.  Neurological: Positive for weakness. Negative for dizziness, tremors and speech difficulty.  Psychiatric/Behavioral: Negative for agitation, dysphoric mood, sleep disturbance and suicidal ideas. The patient is nervous/anxious.     Objective:  BP 136/72 (BP Location: Left Arm, Patient Position: Sitting,  Cuff Size: Large)    Pulse 72    Temp 98.3 F (36.8 C) (Oral)    Ht 5\' 9"  (1.753 m)    Wt 283 lb (128.4 kg)    SpO2 94%    BMI 41.79 kg/m   BP Readings from Last 3 Encounters:  02/02/19 136/72  11/02/18 128/72  08/03/18 132/68    Wt Readings from Last 3 Encounters:  02/02/19 283 lb (128.4 kg)  11/02/18 280 lb (127 kg)  08/03/18 276 lb (125.2 kg)    Physical Exam Constitutional:      General: He is not in acute distress.    Appearance: He is well-developed.     Comments: NAD  Eyes:     Conjunctiva/sclera: Conjunctivae  normal.     Pupils: Pupils are equal, round, and reactive to light.  Neck:     Musculoskeletal: Normal range of motion.     Thyroid: No thyromegaly.     Vascular: No JVD.  Cardiovascular:     Rate and Rhythm: Normal rate and regular rhythm.     Heart sounds: Normal heart sounds. No murmur. No friction rub. No gallop.   Pulmonary:     Effort: Pulmonary effort is normal. No respiratory distress.     Breath sounds: Normal breath sounds. No wheezing or rales.  Chest:     Chest wall: No tenderness.  Abdominal:     General: Bowel sounds are normal. There is no distension.     Palpations: Abdomen is soft. There is no mass.     Tenderness: There is no abdominal tenderness. There is no guarding or rebound.  Musculoskeletal: Normal range of motion.        General: Tenderness present.  Lymphadenopathy:     Cervical: No cervical adenopathy.  Skin:    General: Skin is warm and dry.     Findings: No rash.  Neurological:     Mental Status: He is alert and oriented to person, place, and time.     Cranial Nerves: No cranial nerve deficit.     Motor: Weakness present. No abnormal muscle tone.     Coordination: Coordination normal.     Gait: Gait abnormal.     Deep Tendon Reflexes: Reflexes are normal and symmetric.  Psychiatric:        Behavior: Behavior normal.        Thought Content: Thought content normal.        Judgment: Judgment normal.   walker  Lab Results  Component Value Date   WBC 9.1 11/02/2018   HGB 15.7 11/02/2018   HCT 44.7 11/02/2018   PLT 278.0 11/02/2018   GLUCOSE 90 11/02/2018   CHOL 159 11/02/2018   TRIG 97.0 11/02/2018   HDL 38.40 (L) 11/02/2018   LDLDIRECT 152.5 10/04/2009   LDLCALC 101 (H) 11/02/2018   ALT 34 11/02/2018   AST 27 11/02/2018   NA 135 11/02/2018   K 4.7 11/02/2018   CL 101 11/02/2018   CREATININE 1.09 11/02/2018   BUN 18 11/02/2018   CO2 25 11/02/2018   TSH 0.26 (L) 11/02/2018   PSA 3.30 11/02/2018   HGBA1C 6.1 11/02/2018    Vas US  Aorta Medicare Screen  Result Date: 01/11/2018 ABDOMINAL AORTA STUDY Indications: Adominal bruit Risk         Hypertension, hyperlipidemia, current smoker, coronary artery Factors:     disease.  Limitations: Air/bowel gas and obesity.  Examination Guidelines: A complete evaluation includes B-mode imaging, spectral doppler, color doppler, and power  Doppler as needed of all accessible portions of each vessel. Bilateral testing is considered an integral part of a complete examination. Limited examinations for reoccurring indications may be performed as noted.  Abdominal Aorta Findings: +-------------+-------+----------+----------+---------+--------+---------------+  Location      AP (cm) Trans (cm) PSV (cm/s) Waveform  Thrombus Comments         +-------------+-------+----------+----------+---------+--------+---------------+  Proximal      2.5     2.6        68         biphasic                            +-------------+-------+----------+----------+---------+--------+---------------+  Mid           2.1     2.1        104        triphasic                           +-------------+-------+----------+----------+---------+--------+---------------+  Distal        2.0     2.0        97         triphasic                           +-------------+-------+----------+----------+---------+--------+---------------+  RT CIA Prox   1.4     1.4        93         biphasic                            +-------------+-------+----------+----------+---------+--------+---------------+  RT CIA Mid                                                     not visualized,                                                                  bowel gas        +-------------+-------+----------+----------+---------+--------+---------------+  RT CIA Distal                    167        biphasic                            +-------------+-------+----------+----------+---------+--------+---------------+  RT EIA Prox   0.9     0.9        99         triphasic                            +-------------+-------+----------+----------+---------+--------+---------------+  LT CIA Prox   1.3     1.3                                      not visualized   +-------------+-------+----------+----------+---------+--------+---------------+  LT CIA Distal                    131        triphasic                           +-------------+-------+----------+----------+---------+--------+---------------+  LT EIA Prox   0.8     0.8        140        triphasic                           +-------------+-------+----------+----------+---------+--------+---------------+ Visualization of the Left CIA Proximal artery, Left CIA Mid artery and Right CIA Mid artery was limited. Technically challenging exam due to overlying bowel gas and patient abdominal girth/obesity.  IVC/Iliac Findings: +--------+------+--------+-------+           Patent Thrombus Comment  +--------+------+--------+-------+  IVC Prox patent                   +--------+------+--------+-------+  Final Interpretation: Technically challenging exam due to overlying bowel gas and patient abdominal girth/obesity. Abdominal Aorta: No evidence of an abdominal aortic aneurysm was visualized. The largest aortic measurement is 2.6 cm in the proximal aorta. Stenosis: Patent aorta and bilateral iliac arteries without evidence of significant stenosis.  *See table(s) above for measurements and observations.  Electronically signed by Quay Burow on 01/11/2018 at 4:42:59 PM.    Final    Vas US Carotid  Result Date: 01/11/2018 Carotid Arterial Duplex Study Indications:       Bruit. Risk Factors:      Hypertension, hyperlipidemia, current smoker, coronary artery                    disease, PAD. Comparison Study:  No previous studies available for comparison. Examination Guidelines: A complete evaluation includes B-mode imaging, spectral Doppler, color Doppler, and power Doppler as needed of all accessible portions of each vessel. Bilateral testing is  considered an integral part of a complete examination. Limited examinations for reoccurring indications may be performed as noted.  Right Carotid Findings: +----------+--------+--------+--------+---------------------+--------+             PSV cm/s EDV cm/s Stenosis Describe              Comments  +----------+--------+--------+--------+---------------------+--------+  CCA Prox   85       10                                                +----------+--------+--------+--------+---------------------+--------+  CCA Distal 63       11                                                +----------+--------+--------+--------+---------------------+--------+  ICA Prox   101      24       1-39%    focal and hyperechoic           +----------+--------+--------+--------+---------------------+--------+  ICA Mid    73       16                                                +----------+--------+--------+--------+---------------------+--------+  ICA Distal 69       17                                                +----------+--------+--------+--------+---------------------+--------+  ECA        155      19                                                +----------+--------+--------+--------+---------------------+--------+ +----------+--------+-------+----------------+-------------------+             PSV cm/s EDV cms Describe         Arm Pressure (mmHG)  +----------+--------+-------+----------------+-------------------+  Subclavian 181              Multiphasic, WNL 122                  +----------+--------+-------+----------------+-------------------+ +---------+--------+--+--------+--+---------+  Vertebral PSV cm/s 48 EDV cm/s 13 Antegrade  +---------+--------+--+--------+--+---------+  Left Carotid Findings: +----------+--------+--------+--------+-----------+--------+             PSV cm/s EDV cm/s Stenosis Describe    Comments  +----------+--------+--------+--------+-----------+--------+  CCA Prox   94       11                                       +----------+--------+--------+--------+-----------+--------+  CCA Distal 62       13                                      +----------+--------+--------+--------+-----------+--------+  ICA Prox   43       13                hyperechoic           +----------+--------+--------+--------+-----------+--------+  ICA Mid    64       17       1-39%                          +----------+--------+--------+--------+-----------+--------+  ICA Distal 82       22                            tortuous  +----------+--------+--------+--------+-----------+--------+  ECA        123      13                                      +----------+--------+--------+--------+-----------+--------+ +----------+--------+--------+----------------+-------------------+  Subclavian PSV cm/s EDV cm/s Describe         Arm Pressure (mmHG)  +----------+--------+--------+----------------+-------------------+             188               Multiphasic, WNL 130                  +----------+--------+--------+----------------+-------------------+ +---------+--------+--+--------+--+---------+  Vertebral PSV cm/s 40 EDV cm/s 11 Antegrade  +---------+--------+--+--------+--+---------+  Final Interpretation: Right Carotid:  Velocities in the right ICA are consistent with a 1-39% stenosis. Left Carotid: Velocities in the left ICA are consistent with a 1-39% stenosis. Vertebrals:  Bilateral vertebral arteries demonstrate antegrade flow. Subclavians: Normal flow hemodynamics were seen in bilateral subclavian              arteries. *See table(s) above for measurements and observations.  Electronically signed by Quay Burow on 01/11/2018 at 4:41:47 PM.    Final     Assessment & Plan:   There are no diagnoses linked to this encounter.   No orders of the defined types were placed in this encounter.    Follow-up: No follow-ups on file.  Walker Kehr, MD

## 2019-02-02 NOTE — Patient Instructions (Signed)

## 2019-02-02 NOTE — Assessment & Plan Note (Signed)
Pt declined treatment

## 2019-04-12 DIAGNOSIS — H25811 Combined forms of age-related cataract, right eye: Secondary | ICD-10-CM | POA: Diagnosis not present

## 2019-04-12 DIAGNOSIS — H2511 Age-related nuclear cataract, right eye: Secondary | ICD-10-CM | POA: Diagnosis not present
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
## 2019-04-18 ENCOUNTER — Ambulatory Visit (INDEPENDENT_AMBULATORY_CARE_PROVIDER_SITE_OTHER): Payer: Medicare Other | Admitting: Cardiology

## 2019-04-18 ENCOUNTER — Other Ambulatory Visit: Payer: Self-pay

## 2019-04-18 ENCOUNTER — Encounter: Payer: Self-pay | Admitting: Cardiology

## 2019-04-18 VITALS — BP 140/80 | HR 74 | Ht 69.0 in | Wt 282.2 lb

## 2019-04-18 DIAGNOSIS — E7800 Pure hypercholesterolemia, unspecified: Secondary | ICD-10-CM

## 2019-04-18 DIAGNOSIS — I25810 Atherosclerosis of coronary artery bypass graft(s) without angina pectoris: Secondary | ICD-10-CM

## 2019-04-18 DIAGNOSIS — I10 Essential (primary) hypertension: Secondary | ICD-10-CM

## 2019-04-18 NOTE — Patient Instructions (Signed)

## 2019-05-09 ENCOUNTER — Encounter: Payer: Self-pay | Admitting: Internal Medicine

## 2019-05-09 ENCOUNTER — Other Ambulatory Visit (INDEPENDENT_AMBULATORY_CARE_PROVIDER_SITE_OTHER): Payer: Medicare Other

## 2019-05-09 ENCOUNTER — Ambulatory Visit (INDEPENDENT_AMBULATORY_CARE_PROVIDER_SITE_OTHER): Payer: Medicare Other | Admitting: Internal Medicine

## 2019-05-09 ENCOUNTER — Other Ambulatory Visit: Payer: Self-pay

## 2019-05-09 DIAGNOSIS — R531 Weakness: Secondary | ICD-10-CM | POA: Diagnosis not present

## 2019-05-09 DIAGNOSIS — I739 Peripheral vascular disease, unspecified: Secondary | ICD-10-CM

## 2019-05-09 DIAGNOSIS — F411 Generalized anxiety disorder: Secondary | ICD-10-CM

## 2019-05-09 DIAGNOSIS — I10 Essential (primary) hypertension: Secondary | ICD-10-CM | POA: Diagnosis not present

## 2019-05-09 DIAGNOSIS — N320 Bladder-neck obstruction: Secondary | ICD-10-CM | POA: Diagnosis not present

## 2019-05-09 DIAGNOSIS — E785 Hyperlipidemia, unspecified: Secondary | ICD-10-CM | POA: Diagnosis not present

## 2019-05-09 DIAGNOSIS — I2510 Atherosclerotic heart disease of native coronary artery without angina pectoris: Secondary | ICD-10-CM

## 2019-05-09 DIAGNOSIS — E538 Deficiency of other specified B group vitamins: Secondary | ICD-10-CM

## 2019-05-09 LAB — CBC WITH DIFFERENTIAL/PLATELET
Basophils Absolute: 0.1 10*3/uL (ref 0.0–0.1)
Basophils Relative: 1.2 % (ref 0.0–3.0)
Eosinophils Absolute: 0 10*3/uL (ref 0.0–0.7)
Eosinophils Relative: 0.3 % (ref 0.0–5.0)
HCT: 44.3 % (ref 39.0–52.0)
Hemoglobin: 15.3 g/dL (ref 13.0–17.0)
Lymphocytes Relative: 32.8 % (ref 12.0–46.0)
Lymphs Abs: 2.7 10*3/uL (ref 0.7–4.0)
MCHC: 34.6 g/dL (ref 30.0–36.0)
MCV: 93.8 fl (ref 78.0–100.0)
Monocytes Absolute: 0.6 10*3/uL (ref 0.1–1.0)
Monocytes Relative: 7.1 % (ref 3.0–12.0)
Neutro Abs: 4.8 10*3/uL (ref 1.4–7.7)
Neutrophils Relative %: 58.6 % (ref 43.0–77.0)
Platelets: 284 10*3/uL (ref 150.0–400.0)
RBC: 4.72 Mil/uL (ref 4.22–5.81)
RDW: 13.6 % (ref 11.5–15.5)
WBC: 8.2 10*3/uL (ref 4.0–10.5)

## 2019-05-09 LAB — LIPID PANEL
Cholesterol: 163 mg/dL (ref 0–200)
HDL: 35.8 mg/dL — ABNORMAL LOW (ref >39.00)
LDL Cholesterol: 107 mg/dL — ABNORMAL HIGH (ref 0–99)
NonHDL: 126.97
Total CHOL/HDL Ratio: 5
Triglycerides: 100 mg/dL (ref 0.0–149.0)
VLDL: 20 mg/dL (ref 0.0–40.0)

## 2019-05-09 LAB — BASIC METABOLIC PANEL
BUN: 16 mg/dL (ref 6–23)
CO2: 28 mEq/L (ref 19–32)
Calcium: 9.8 mg/dL (ref 8.4–10.5)
Chloride: 99 mEq/L (ref 96–112)
Creatinine, Ser: 1.14 mg/dL (ref 0.40–1.50)
GFR: 63.35 mL/min (ref >60.00)
Glucose, Bld: 92 mg/dL (ref 70–99)
Potassium: 4.4 mEq/L (ref 3.5–5.1)
Sodium: 135 mEq/L (ref 135–145)

## 2019-05-09 LAB — HEPATIC FUNCTION PANEL
ALT: 32 U/L (ref 0–53)
AST: 23 U/L (ref 0–37)
Albumin: 4.1 g/dL (ref 3.5–5.2)
Alkaline Phosphatase: 55 U/L (ref 39–117)
Bilirubin, Direct: 0.1 mg/dL (ref 0.0–0.3)
Total Bilirubin: 0.9 mg/dL (ref 0.2–1.2)
Total Protein: 7 g/dL (ref 6.0–8.3)

## 2019-05-09 LAB — PSA: PSA: 4.45 ng/mL — ABNORMAL HIGH (ref 0.10–4.00)

## 2019-05-09 LAB — TSH: TSH: 0.36 u[IU]/mL (ref 0.35–4.50)

## 2019-05-09 NOTE — Patient Instructions (Signed)

## 2019-05-09 NOTE — Assessment & Plan Note (Signed)
No angina 

## 2019-05-09 NOTE — Progress Notes (Signed)
Subjective:  Patient ID: Joe [name], male    DOB: Nov 09, 1945  Age: 73 y.o. MRN: [id number]  CC: No chief complaint on file.   HPI Joe [name] presents for anxiety, allergies, edema f/u  Outpatient Medications Prior to Visit  Medication Sig Dispense Refill   acetaminophen (TYLENOL) 325 MG tablet Take 2 tablets (650 mg total) by mouth every 6 (six) hours as needed for mild pain (or Fever >/= 101). 30 tablet 0   ALPRAZolam (XANAX) 0.25 MG tablet Take 1 tablet (0.25 mg total) by mouth 3 (three) times daily as needed for anxiety. 30 tablet 0   aspirin 81 MG tablet Take 81 mg by mouth daily.       azithromycin (ZITHROMAX Z-PAK) 250 MG tablet As directed 6 tablet 0   Cholecalciferol (VITAMIN D) 1000 UNITS capsule Take 1,000 Units by mouth daily.       diclofenac sodium (VOLTAREN) 1 % GEL Apply 2 g topically 4 (four) times daily. 100 g 3   fluticasone (FLONASE) 50 MCG/ACT nasal spray Place 2 sprays into both nostrils daily as needed for allergies. 16 g 11   Ipratropium-Albuterol (COMBIVENT RESPIMAT) 20-100 MCG/ACT AERS Inhale 2 Act into the lungs 4 (four) times daily - after meals and at bedtime. (Patient taking differently: Inhale 2 Act into the lungs as needed for shortness of breath. ) 1 Inhaler 11   linaclotide (LINZESS) 145 MCG CAPS capsule Take 1 capsule (145 mcg total) by mouth daily as needed. 30 capsule 11   loratadine (CLARITIN) 10 MG tablet Take 1 tablet (10 mg total) by mouth daily. (Patient taking differently: Take 10 mg by mouth daily as needed. ) 30 tablet 11   mirabegron ER (MYRBETRIQ) 50 MG TB24 tablet Take 1 tablet (50 mg total) by mouth daily. 30 tablet 11   moxifloxacin (VIGAMOX) 0.5 % ophthalmic solution START AFTER SURGERY, ONE DROP 4 TIMES A DAY     olmesartan (BENICAR) 40 MG tablet Take 1 tablet (40 mg total) by mouth daily. 90 tablet 3   prednisoLONE acetate (PRED FORTE) 1 % ophthalmic suspension START AFTER SURGERY, AS DIRECTED BY M.D. ON THE DAY  OF SURGERY     pyridoxine (B-6) 100 MG tablet Take 100 mg by mouth daily.     torsemide (DEMADEX) 20 MG tablet Take 1 tablet (20 mg total) by mouth daily. 90 tablet 3   vitamin B-12 (CYANOCOBALAMIN) 1000 MCG tablet Take 1,500 mcg by mouth daily. 1 1/2 tab po qd      No facility-administered medications prior to visit.     ROS: Review of Systems  Constitutional: Positive for fatigue. Negative for appetite change and unexpected weight change.  HENT: Negative for congestion, nosebleeds, sneezing, sore throat and trouble swallowing.   Eyes: Negative for itching and visual disturbance.  Respiratory: Negative for cough.   Cardiovascular: Positive for leg swelling. Negative for chest pain and palpitations.  Gastrointestinal: Negative for abdominal distention, blood in stool, diarrhea and nausea.  Genitourinary: Negative for frequency and hematuria.  Musculoskeletal: Positive for arthralgias, back pain and gait problem. Negative for joint swelling and neck pain.  Skin: Negative for rash.  Neurological: Negative for dizziness, tremors, speech difficulty and weakness.  Psychiatric/Behavioral: Negative for agitation, dysphoric mood, sleep disturbance and suicidal ideas. The patient is not nervous/anxious.     Objective:  BP 136/78 (BP Location: Left Arm, Patient Position: Sitting, Cuff Size: Large)    Pulse 78    Temp 98.4 F (36.9 C) (Oral)  Ht 5\' 9"  (1.753 m)    Wt 283 lb (128.4 kg)    SpO2 97%    BMI 41.79 kg/m   BP Readings from Last 3 Encounters:  05/09/19 136/78  04/18/19 140/80  02/02/19 136/72    Wt Readings from Last 3 Encounters:  05/09/19 283 lb (128.4 kg)  04/18/19 282 lb 3.2 oz (128 kg)  02/02/19 283 lb (128.4 kg)    Physical Exam Constitutional:      General: He is not in acute distress.    Appearance: He is well-developed. He is obese.     Comments: NAD  Eyes:     Conjunctiva/sclera: Conjunctivae normal.     Pupils: Pupils are equal, round, and reactive to  light.  Neck:     Musculoskeletal: Normal range of motion.     Thyroid: No thyromegaly.     Vascular: No JVD.  Cardiovascular:     Rate and Rhythm: Normal rate and regular rhythm.     Heart sounds: Normal heart sounds. No murmur. No friction rub. No gallop.   Pulmonary:     Effort: Pulmonary effort is normal. No respiratory distress.     Breath sounds: Normal breath sounds. No wheezing or rales.  Chest:     Chest wall: No tenderness.  Abdominal:     General: Bowel sounds are normal. There is no distension.     Palpations: Abdomen is soft. There is no mass.     Tenderness: There is no abdominal tenderness. There is no guarding or rebound.  Musculoskeletal: Normal range of motion.        General: No tenderness.     Right lower leg: Edema present.     Left lower leg: Edema present.  Lymphadenopathy:     Cervical: No cervical adenopathy.  Skin:    General: Skin is warm and dry.     Findings: No rash.  Neurological:     Mental Status: He is alert and oriented to person, place, and time.     Cranial Nerves: No cranial nerve deficit.     Motor: Weakness present. No abnormal muscle tone.     Coordination: Coordination abnormal.     Gait: Gait abnormal.     Deep Tendon Reflexes: Reflexes are normal and symmetric.  Psychiatric:        Behavior: Behavior normal.        Thought Content: Thought content normal.        Judgment: Judgment normal.   trace edema B LS stiff walker  Lab Results  Component Value Date   WBC 9.1 11/02/2018   HGB 15.7 11/02/2018   HCT 44.7 11/02/2018   PLT 278.0 11/02/2018   GLUCOSE 90 11/02/2018   CHOL 159 11/02/2018   TRIG 97.0 11/02/2018   HDL 38.40 (L) 11/02/2018   LDLDIRECT 152.5 10/04/2009   LDLCALC 101 (H) 11/02/2018   ALT 34 11/02/2018   AST 27 11/02/2018   NA 135 11/02/2018   K 4.7 11/02/2018   CL 101 11/02/2018   CREATININE 1.09 11/02/2018   BUN 18 11/02/2018   CO2 25 11/02/2018   TSH 0.26 (L) 11/02/2018   PSA 3.30 11/02/2018    HGBA1C 6.1 11/02/2018    Vas US Aorta Medicare Screen  Result Date: 01/11/2018 ABDOMINAL AORTA STUDY Indications: Adominal bruit Risk         Hypertension, hyperlipidemia, current smoker, coronary artery Factors:     disease.  Limitations: Air/bowel gas and obesity.  Examination Guidelines: A complete evaluation includes  B-mode imaging, spectral doppler, color doppler, and power Doppler as needed of all accessible portions of each vessel. Bilateral testing is considered an integral part of a complete examination. Limited examinations for reoccurring indications may be performed as noted.  Abdominal Aorta Findings: +-------------+-------+----------+----------+---------+--------+---------------+  Location      AP (cm) Trans (cm) PSV (cm/s) Waveform  Thrombus Comments         +-------------+-------+----------+----------+---------+--------+---------------+  Proximal      2.5     2.6        68         biphasic                            +-------------+-------+----------+----------+---------+--------+---------------+  Mid           2.1     2.1        104        triphasic                           +-------------+-------+----------+----------+---------+--------+---------------+  Distal        2.0     2.0        97         triphasic                           +-------------+-------+----------+----------+---------+--------+---------------+  RT CIA Prox   1.4     1.4        93         biphasic                            +-------------+-------+----------+----------+---------+--------+---------------+  RT CIA Mid                                                     not visualized,                                                                  bowel gas        +-------------+-------+----------+----------+---------+--------+---------------+  RT CIA Distal                    167        biphasic                            +-------------+-------+----------+----------+---------+--------+---------------+  RT EIA Prox   0.9     0.9         99         triphasic                           +-------------+-------+----------+----------+---------+--------+---------------+  LT CIA Prox   1.3     1.3  not visualized   +-------------+-------+----------+----------+---------+--------+---------------+  LT CIA Distal                    131        triphasic                           +-------------+-------+----------+----------+---------+--------+---------------+  LT EIA Prox   0.8     0.8        140        triphasic                           +-------------+-------+----------+----------+---------+--------+---------------+ Visualization of the Left CIA Proximal artery, Left CIA Mid artery and Right CIA Mid artery was limited. Technically challenging exam due to overlying bowel gas and patient abdominal girth/obesity.  IVC/Iliac Findings: +--------+------+--------+-------+           Patent Thrombus Comment  +--------+------+--------+-------+  IVC Prox patent                   +--------+------+--------+-------+  Final Interpretation: Technically challenging exam due to overlying bowel gas and patient abdominal girth/obesity. Abdominal Aorta: No evidence of an abdominal aortic aneurysm was visualized. The largest aortic measurement is 2.6 cm in the proximal aorta. Stenosis: Patent aorta and bilateral iliac arteries without evidence of significant stenosis.  *See table(s) above for measurements and observations.  Electronically signed by Quay Burow on 01/11/2018 at 4:42:59 PM.    Final    Vas US Carotid  Result Date: 01/11/2018 Carotid Arterial Duplex Study Indications:       Bruit. Risk Factors:      Hypertension, hyperlipidemia, current smoker, coronary artery                    disease, PAD. Comparison Study:  No previous studies available for comparison. Examination Guidelines: A complete evaluation includes B-mode imaging, spectral Doppler, color Doppler, and power Doppler as needed of all accessible portions of each  vessel. Bilateral testing is considered an integral part of a complete examination. Limited examinations for reoccurring indications may be performed as noted.  Right Carotid Findings: +----------+--------+--------+--------+---------------------+--------+             PSV cm/s EDV cm/s Stenosis Describe              Comments  +----------+--------+--------+--------+---------------------+--------+  CCA Prox   85       10                                                +----------+--------+--------+--------+---------------------+--------+  CCA Distal 63       11                                                +----------+--------+--------+--------+---------------------+--------+  ICA Prox   101      24       1-39%    focal and hyperechoic           +----------+--------+--------+--------+---------------------+--------+  ICA Mid    73       16                                                +----------+--------+--------+--------+---------------------+--------+  ICA Distal 69       17                                                +----------+--------+--------+--------+---------------------+--------+  ECA        155      19                                                +----------+--------+--------+--------+---------------------+--------+ +----------+--------+-------+----------------+-------------------+             PSV cm/s EDV cms Describe         Arm Pressure (mmHG)  +----------+--------+-------+----------------+-------------------+  Subclavian 181              Multiphasic, WNL 122                  +----------+--------+-------+----------------+-------------------+ +---------+--------+--+--------+--+---------+  Vertebral PSV cm/s 48 EDV cm/s 13 Antegrade  +---------+--------+--+--------+--+---------+  Left Carotid Findings: +----------+--------+--------+--------+-----------+--------+             PSV cm/s EDV cm/s Stenosis Describe    Comments  +----------+--------+--------+--------+-----------+--------+  CCA Prox   94        11                                      +----------+--------+--------+--------+-----------+--------+  CCA Distal 62       13                                      +----------+--------+--------+--------+-----------+--------+  ICA Prox   43       13                hyperechoic           +----------+--------+--------+--------+-----------+--------+  ICA Mid    64       17       1-39%                          +----------+--------+--------+--------+-----------+--------+  ICA Distal 82       22                            tortuous  +----------+--------+--------+--------+-----------+--------+  ECA        123      13                                      +----------+--------+--------+--------+-----------+--------+ +----------+--------+--------+----------------+-------------------+  Subclavian PSV cm/s EDV cm/s Describe         Arm Pressure (mmHG)  +----------+--------+--------+----------------+-------------------+             188               Multiphasic, WNL 130                  +----------+--------+--------+----------------+-------------------+ +---------+--------+--+--------+--+---------+  Vertebral PSV cm/s 40 EDV cm/s 11 Antegrade  +---------+--------+--+--------+--+---------+  Final Interpretation: Right Carotid:  Velocities in the right ICA are consistent with a 1-39% stenosis. Left Carotid: Velocities in the left ICA are consistent with a 1-39% stenosis. Vertebrals:  Bilateral vertebral arteries demonstrate antegrade flow. Subclavians: Normal flow hemodynamics were seen in bilateral subclavian              arteries. *See table(s) above for measurements and observations.  Electronically signed by Quay Burow on 01/11/2018 at 4:41:47 PM.    Final     Assessment & Plan:   There are no diagnoses linked to this encounter.   No orders of the defined types were placed in this encounter.    Follow-up: No follow-ups on file.  Walker Kehr, MD

## 2019-05-09 NOTE — Assessment & Plan Note (Signed)
Benicar and Torsemide  

## 2019-05-09 NOTE — Assessment & Plan Note (Signed)
On B12 

## 2019-05-09 NOTE — Assessment & Plan Note (Signed)
Alprazolam prn  Potential benefits of a long term benzodiazepines  use as well as potential risks  and complications were explained to the patient and were aknowledged. 

## 2019-05-09 NOTE — Assessment & Plan Note (Signed)
Use a walker 

## 2019-05-10 ENCOUNTER — Other Ambulatory Visit: Payer: Self-pay | Admitting: Internal Medicine

## 2019-05-10 DIAGNOSIS — R972 Elevated prostate specific antigen [PSA]: Secondary | ICD-10-CM

## 2019-08-08 ENCOUNTER — Ambulatory Visit (INDEPENDENT_AMBULATORY_CARE_PROVIDER_SITE_OTHER): Payer: Medicare Other | Admitting: Internal Medicine

## 2019-08-08 ENCOUNTER — Encounter: Payer: Self-pay | Admitting: Internal Medicine

## 2019-08-08 ENCOUNTER — Other Ambulatory Visit: Payer: Self-pay

## 2019-08-08 ENCOUNTER — Other Ambulatory Visit (INDEPENDENT_AMBULATORY_CARE_PROVIDER_SITE_OTHER): Payer: Medicare Other

## 2019-08-08 DIAGNOSIS — G629 Polyneuropathy, unspecified: Secondary | ICD-10-CM

## 2019-08-08 DIAGNOSIS — I10 Essential (primary) hypertension: Secondary | ICD-10-CM

## 2019-08-08 DIAGNOSIS — E559 Vitamin D deficiency, unspecified: Secondary | ICD-10-CM

## 2019-08-08 DIAGNOSIS — R972 Elevated prostate specific antigen [PSA]: Secondary | ICD-10-CM | POA: Diagnosis not present

## 2019-08-08 DIAGNOSIS — E538 Deficiency of other specified B group vitamins: Secondary | ICD-10-CM

## 2019-08-08 DIAGNOSIS — F411 Generalized anxiety disorder: Secondary | ICD-10-CM

## 2019-08-08 LAB — BASIC METABOLIC PANEL
BUN: 17 mg/dL (ref 6–23)
CO2: 26 mEq/L (ref 19–32)
Calcium: 9.7 mg/dL (ref 8.4–10.5)
Chloride: 99 mEq/L (ref 96–112)
Creatinine, Ser: 1.19 mg/dL (ref 0.40–1.50)
GFR: 60.25 mL/min (ref >60.00)
Glucose, Bld: 100 mg/dL — ABNORMAL HIGH (ref 70–99)
Potassium: 4.1 mEq/L (ref 3.5–5.1)
Sodium: 135 mEq/L (ref 135–145)

## 2019-08-08 LAB — PSA: PSA: 5.04 ng/mL — ABNORMAL HIGH (ref 0.10–4.00)

## 2019-08-08 LAB — HEPATIC FUNCTION PANEL
ALT: 36 U/L (ref 0–53)
AST: 24 U/L (ref 0–37)
Albumin: 4.2 g/dL (ref 3.5–5.2)
Alkaline Phosphatase: 58 U/L (ref 39–117)
Bilirubin, Direct: 0.1 mg/dL (ref 0.0–0.3)
Total Bilirubin: 0.7 mg/dL (ref 0.2–1.2)
Total Protein: 7.1 g/dL (ref 6.0–8.3)

## 2019-08-08 MED ORDER — TORSEMIDE 20 MG PO TABS: 20.0000 mg | ORAL_TABLET | Freq: Every day | ORAL | 3 refills | Status: DC

## 2019-08-08 NOTE — Assessment & Plan Note (Signed)
BP Readings from Last 3 Encounters:  08/08/19 132/72  05/09/19 136/78  04/18/19 140/80

## 2019-08-08 NOTE — Assessment & Plan Note (Signed)
Alprazolam prn  Potential benefits of a long term benzodiazepines  use as well as potential risks  and complications were explained to the patient and were aknowledged. 

## 2019-08-08 NOTE — Assessment & Plan Note (Signed)
On B12 

## 2019-08-08 NOTE — Assessment & Plan Note (Signed)
Vit D 

## 2019-08-08 NOTE — Progress Notes (Signed)
Subjective:  Patient ID: Joe [name], male    DOB: 09-15-1945  Age: 73 y.o. MRN: [id number]  CC: No chief complaint on file.   HPI Joe [name] presents for leg swelling, dizziness, anxiety f/u  Outpatient Medications Prior to Visit  Medication Sig Dispense Refill  . acetaminophen (TYLENOL) 325 MG tablet Take 2 tablets (650 mg total) by mouth every 6 (six) hours as needed for mild pain (or Fever >/= 101). 30 tablet 0  . ALPRAZolam (XANAX) 0.25 MG tablet Take 1 tablet (0.25 mg total) by mouth 3 (three) times daily as needed for anxiety. 30 tablet 0  . aspirin 81 MG tablet Take 81 mg by mouth daily.      . Cholecalciferol (VITAMIN D) 1000 UNITS capsule Take 1,000 Units by mouth daily.      . diclofenac sodium (VOLTAREN) 1 % GEL Apply 2 g topically 4 (four) times daily. 100 g 3  . fluticasone (FLONASE) 50 MCG/ACT nasal spray Place 2 sprays into both nostrils daily as needed for allergies. 16 g 11  . Ipratropium-Albuterol (COMBIVENT RESPIMAT) 20-100 MCG/ACT AERS Inhale 2 Act into the lungs 4 (four) times daily - after meals and at bedtime. (Patient taking differently: Inhale 2 Act into the lungs as needed for shortness of breath. ) 1 Inhaler 11  . linaclotide (LINZESS) 145 MCG CAPS capsule Take 1 capsule (145 mcg total) by mouth daily as needed. 30 capsule 11  . loratadine (CLARITIN) 10 MG tablet Take 1 tablet (10 mg total) by mouth daily. (Patient taking differently: Take 10 mg by mouth daily as needed. ) 30 tablet 11  . mirabegron ER (MYRBETRIQ) 50 MG TB24 tablet Take 1 tablet (50 mg total) by mouth daily. 30 tablet 11  . moxifloxacin (VIGAMOX) 0.5 % ophthalmic solution START AFTER SURGERY, ONE DROP 4 TIMES A DAY    . olmesartan (BENICAR) 40 MG tablet Take 1 tablet (40 mg total) by mouth daily. 90 tablet 3  . prednisoLONE acetate (PRED FORTE) 1 % ophthalmic suspension START AFTER SURGERY, AS DIRECTED BY M.D. ON THE DAY OF SURGERY    . pyridoxine (B-6) 100 MG tablet Take 100 mg by  mouth daily.    [name] torsemide (DEMADEX) 20 MG tablet Take 1 tablet (20 mg total) by mouth daily. 90 tablet 3  . vitamin B-12 (CYANOCOBALAMIN) 1000 MCG tablet Take 1,500 mcg by mouth daily. 1 1/2 tab po qd      No facility-administered medications prior to visit.    ROS: Review of Systems  Constitutional: Negative for appetite change, fatigue and unexpected weight change.  HENT: Negative for congestion, nosebleeds, sneezing, sore throat and trouble swallowing.   Eyes: Negative for itching and visual disturbance.  Respiratory: Negative for cough.   Cardiovascular: Positive for leg swelling. Negative for chest pain and palpitations.  Gastrointestinal: Negative for abdominal distention, blood in stool, diarrhea and nausea.  Genitourinary: Negative for frequency and hematuria.  Musculoskeletal: Positive for gait problem. Negative for back pain, joint swelling and neck pain.  Skin: Negative for color change and rash.  Neurological: Negative for dizziness, tremors, speech difficulty and weakness.  Psychiatric/Behavioral: Negative for agitation, dysphoric mood, sleep disturbance and suicidal ideas. The patient is not nervous/anxious.     Objective:  BP 132/72 (BP Location: Left Arm, Patient Position: Sitting, Cuff Size: Large)   Pulse 81   Temp 98.6 F (37 C) (Oral)   Ht 5\' 9"  (1.753 m)   Wt 286 lb (129.7 kg)   SpO2  98%   BMI 42.23 kg/m   BP Readings from Last 3 Encounters:  08/08/19 132/72  05/09/19 136/78  04/18/19 140/80    Wt Readings from Last 3 Encounters:  08/08/19 286 lb (129.7 kg)  05/09/19 283 lb (128.4 kg)  04/18/19 282 lb 3.2 oz (128 kg)    Physical Exam Constitutional:      General: He is not in acute distress.    Appearance: He is well-developed. He is obese.     Comments: NAD  Eyes:     Conjunctiva/sclera: Conjunctivae normal.     Pupils: Pupils are equal, round, and reactive to light.  Neck:     Thyroid: No thyromegaly.     Vascular: No JVD.    Cardiovascular:     Rate and Rhythm: Normal rate and regular rhythm.     Heart sounds: Normal heart sounds. No murmur. No friction rub. No gallop.   Pulmonary:     Effort: Pulmonary effort is normal. No respiratory distress.     Breath sounds: Normal breath sounds. No wheezing or rales.  Chest:     Chest wall: No tenderness.  Abdominal:     General: Bowel sounds are normal. There is no distension.     Palpations: Abdomen is soft. There is no mass.     Tenderness: There is no abdominal tenderness. There is no guarding or rebound.  Musculoskeletal:        General: Swelling and tenderness present. Normal range of motion.     Cervical back: Normal range of motion.  Lymphadenopathy:     Cervical: No cervical adenopathy.  Skin:    General: Skin is warm and dry.     Findings: No rash.  Neurological:     Mental Status: He is alert and oriented to person, place, and time.     Cranial Nerves: No cranial nerve deficit.     Motor: Weakness present. No abnormal muscle tone.     Coordination: Coordination normal.     Gait: Gait abnormal.     Deep Tendon Reflexes: Reflexes are normal and symmetric.  Psychiatric:        Behavior: Behavior normal.        Thought Content: Thought content normal.        Judgment: Judgment normal.   walker B LE edema trace to 1+ obese  Lab Results  Component Value Date   WBC 8.2 05/09/2019   HGB 15.3 05/09/2019   HCT 44.3 05/09/2019   PLT 284.0 05/09/2019   GLUCOSE 92 05/09/2019   CHOL 163 05/09/2019   TRIG 100.0 05/09/2019   HDL 35.80 (L) 05/09/2019   LDLDIRECT 152.5 10/04/2009   LDLCALC 107 (H) 05/09/2019   ALT 32 05/09/2019   AST 23 05/09/2019   NA 135 05/09/2019   K 4.4 05/09/2019   CL 99 05/09/2019   CREATININE 1.14 05/09/2019   BUN 16 05/09/2019   CO2 28 05/09/2019   TSH 0.36 05/09/2019   PSA 4.45 (H) 05/09/2019   HGBA1C 6.1 11/02/2018    VAS US AORTA MEDICARE SCREEN  Result Date: 01/11/2018 ABDOMINAL AORTA STUDY Indications:  Adominal bruit Risk         Hypertension, hyperlipidemia, current smoker, coronary artery Factors:     disease.  Limitations: Air/bowel gas and obesity.  Examination Guidelines: A complete evaluation includes B-mode imaging, spectral doppler, color doppler, and power Doppler as needed of all accessible portions of each vessel. Bilateral testing is considered an integral part of a complete examination. Limited  examinations for reoccurring indications may be performed as noted.  Abdominal Aorta Findings: +-------------+-------+----------+----------+---------+--------+---------------+ Location     AP (cm)Trans (cm)PSV (cm/s)Waveform ThrombusComments        +-------------+-------+----------+----------+---------+--------+---------------+ Proximal     2.5    2.6       68        biphasic                         +-------------+-------+----------+----------+---------+--------+---------------+ Mid          2.1    2.1       104       triphasic                        +-------------+-------+----------+----------+---------+--------+---------------+ Distal       2.0    2.0       97        triphasic                        +-------------+-------+----------+----------+---------+--------+---------------+ RT CIA Prox  1.4    1.4       93        biphasic                         +-------------+-------+----------+----------+---------+--------+---------------+ RT CIA Mid                                               not visualized,                                                          bowel gas       +-------------+-------+----------+----------+---------+--------+---------------+ RT CIA Distal                 167       biphasic                         +-------------+-------+----------+----------+---------+--------+---------------+ RT EIA Prox  0.9    0.9       99        triphasic                         +-------------+-------+----------+----------+---------+--------+---------------+ LT CIA Prox  1.3    1.3                                  not visualized  +-------------+-------+----------+----------+---------+--------+---------------+ LT CIA Distal                 131       triphasic                        +-------------+-------+----------+----------+---------+--------+---------------+ LT EIA Prox  0.8    0.8       140       triphasic                        +-------------+-------+----------+----------+---------+--------+---------------+ Visualization of the  Left CIA Proximal artery, Left CIA Mid artery and Right CIA Mid artery was limited. Technically challenging exam due to overlying bowel gas and patient abdominal girth/obesity.  IVC/Iliac Findings: +--------+------+--------+-------+         PatentThrombusComment +--------+------+--------+-------+ IVC Proxpatent                +--------+------+--------+-------+  Final Interpretation: Technically challenging exam due to overlying bowel gas and patient abdominal girth/obesity. Abdominal Aorta: No evidence of an abdominal aortic aneurysm was visualized. The largest aortic measurement is 2.6 cm in the proximal aorta. Stenosis: Patent aorta and bilateral iliac arteries without evidence of significant stenosis.  *See table(s) above for measurements and observations.  Electronically signed by Quay Burow on 01/11/2018 at 4:42:59 PM.    Final    VAS US CAROTID  Result Date: 01/11/2018 Carotid Arterial Duplex Study Indications:       Bruit. Risk Factors:      Hypertension, hyperlipidemia, current smoker, coronary artery                    disease, PAD. Comparison Study:  No previous studies available for comparison. Examination Guidelines: A complete evaluation includes B-mode imaging, spectral Doppler, color Doppler, and power Doppler as needed of all accessible portions of each vessel. Bilateral testing is considered an integral  part of a complete examination. Limited examinations for reoccurring indications may be performed as noted.  Right Carotid Findings: +----------+--------+--------+--------+---------------------+--------+           PSV cm/sEDV cm/sStenosisDescribe             Comments +----------+--------+--------+--------+---------------------+--------+ CCA Prox  85      10                                            +----------+--------+--------+--------+---------------------+--------+ CCA Distal63      11                                            +----------+--------+--------+--------+---------------------+--------+ ICA Prox  101     24      1-39%   focal and hyperechoic         +----------+--------+--------+--------+---------------------+--------+ ICA Mid   73      16                                            +----------+--------+--------+--------+---------------------+--------+ ICA Distal69      17                                            +----------+--------+--------+--------+---------------------+--------+ ECA       155     19                                            +----------+--------+--------+--------+---------------------+--------+ +----------+--------+-------+----------------+-------------------+           PSV cm/sEDV cmsDescribe        Arm Pressure (mmHG) +----------+--------+-------+----------------+-------------------+ [id number]  Multiphasic, [id number]                 +----------+--------+-------+----------------+-------------------+ +---------+--------+--+--------+--+---------+ VertebralPSV cm/s48EDV cm/s13Antegrade +---------+--------+--+--------+--+---------+  Left Carotid Findings: +----------+--------+--------+--------+-----------+--------+           PSV cm/sEDV cm/sStenosisDescribe   Comments +----------+--------+--------+--------+-----------+--------+ CCA Prox  94      11                                   +----------+--------+--------+--------+-----------+--------+ CCA Distal62      13                                  +----------+--------+--------+--------+-----------+--------+ ICA Prox  43      13              hyperechoic         +----------+--------+--------+--------+-----------+--------+ ICA Mid   64      17      1-39%                       +----------+--------+--------+--------+-----------+--------+ ICA Distal82      22                         tortuous +----------+--------+--------+--------+-----------+--------+ ECA       123     13                                  +----------+--------+--------+--------+-----------+--------+ +----------+--------+--------+----------------+-------------------+ SubclavianPSV cm/sEDV cm/sDescribe        Arm Pressure (mmHG) +----------+--------+--------+----------------+-------------------+           188             Multiphasic, [id number]                 +----------+--------+--------+----------------+-------------------+ +---------+--------+--+--------+--+---------+ VertebralPSV cm/s40EDV cm/s11Antegrade +---------+--------+--+--------+--+---------+  Final Interpretation: Right Carotid: Velocities in the right ICA are consistent with a 1-39% stenosis. Left Carotid: Velocities in the left ICA are consistent with a 1-39% stenosis. Vertebrals:  Bilateral vertebral arteries demonstrate antegrade flow. Subclavians: Normal flow hemodynamics were seen in bilateral subclavian              arteries. *See table(s) above for measurements and observations.  Electronically signed by Quay Burow on 01/11/2018 at 4:41:47 PM.    Final     Assessment & Plan:   There are no diagnoses linked to this encounter.   No orders of the defined types were placed in this encounter.    Follow-up: No follow-ups on file.  Walker Kehr, MD

## 2019-08-08 NOTE — Patient Instructions (Signed)
  These suggestions will probably help you to improve your metabolism if you are not overweight and to lose weight if you are overweight: 1.  Reduce your consumption of sugars and starches.  Eliminate high fructose corn syrup from your diet.  Reduce your consumption of processed foods.  For desserts try to have seasonal fruits, berries, nuts, cheeses or dark chocolate with more than 70% cacao. 2.  Do not snack 3.  You do not have to eat breakfast.  If you choose to have breakfast-eat plain greek yogurt, eggs, oatmeal (without sugar) 4.  Drink water, freshly brewed unsweetened tea (green, black or herbal) or coffee.  Do not drink sodas including diet sodas , juices, beverages sweetened with artificial sweeteners. 5.  Reduce your consumption of refined grains. 6.  Avoid protein drinks such as Optifast, Slim fast etc. Eat chicken, fish, meat, dairy and beans for your sources of protein 7.  Natural unprocessed fats like cold pressed virgin olive oil, butter, coconut oil are good for you.  Eat avocados 8.  Increase your consumption of fiber.  Fruits, berries, vegetables, whole grains, flaxseeds, Chia seeds, beans, popcorn, nuts, oatmeal are good sources of fiber 9.  Use vinegar in your diet, i.e. apple cider vinegar, red wine or balsamic vinegar 10.  You can try fasting.  For example you can skip breakfast and lunch every other day (24-hour fast) 11.  Stress reduction, good night sleep, relaxation, meditation, yoga and other physical activity is likely to help you to maintain low weight too. 12.  If you drink alcohol, limit your alcohol intake to no more than 2 drinks a day.   Cabbage soup recipe that will not make you gain weight: Take 1 small head of cabbage, 1 average pack of celery, 4 green peppers, 4 onions, 2 cans diced tomatoes (they are not available without salt), salt and spices to taste.  Chop cabbage, celery, peppers and onions.  And tomatoes and 2-2.5 liters (2.5 quarts) of water so that it  would just cover the vegetables.  Bring to boil.  Add spices and salt.  Turn heat to low/medium and simmer for 20-25 minutes.  Naturally, you can make a smaller batch and change some of the ingredients.  

## 2019-08-08 NOTE — Assessment & Plan Note (Signed)
Chronic sx's - no change

## 2019-08-08 NOTE — Assessment & Plan Note (Signed)
Diet discussed 

## 2019-08-10 ENCOUNTER — Telehealth: Payer: Self-pay

## 2019-08-10 DIAGNOSIS — R972 Elevated prostate specific antigen [PSA]: Secondary | ICD-10-CM

## 2019-08-10 NOTE — Telephone Encounter (Signed)
Copied from [place] [id number]. Topic: Appointment Scheduling - Scheduling Inquiry for Clinic >> Aug 10, 2019 12:19 PM Greggory Keen D wrote: Reason for CRM: pt called saying "YES" he does want the appt for a consult with a urologist.  This in response to the mychart message yesterday that he recieved.  CB#  [phone]

## 2019-08-15 NOTE — Telephone Encounter (Signed)
Ok Thx 

## 2019-08-15 NOTE — Addendum Note (Signed)
Addended by: Cassandria Anger on: 08/15/2019 07:36 AM   Modules accepted: Orders

## 2019-08-25 HISTORY — PX: PROSTATE BIOPSY: SHX241

## 2019-10-09 ENCOUNTER — Encounter: Payer: Self-pay | Admitting: Internal Medicine

## 2019-10-18 ENCOUNTER — Other Ambulatory Visit: Payer: Self-pay | Admitting: Internal Medicine

## 2019-10-20 ENCOUNTER — Other Ambulatory Visit: Payer: Self-pay

## 2019-10-20 MED ORDER — TORSEMIDE 20 MG PO TABS: 20.0000 mg | ORAL_TABLET | Freq: Every day | ORAL | 3 refills | Status: DC

## 2019-11-07 ENCOUNTER — Ambulatory Visit (INDEPENDENT_AMBULATORY_CARE_PROVIDER_SITE_OTHER): Payer: Medicare Other | Admitting: Internal Medicine

## 2019-11-07 ENCOUNTER — Encounter: Payer: Self-pay | Admitting: Internal Medicine

## 2019-11-07 ENCOUNTER — Other Ambulatory Visit: Payer: Self-pay

## 2019-11-07 VITALS — BP 140/62 | HR 83 | Temp 98.6°F | Ht 69.0 in | Wt 278.0 lb

## 2019-11-07 DIAGNOSIS — E7800 Pure hypercholesterolemia, unspecified: Secondary | ICD-10-CM

## 2019-11-07 DIAGNOSIS — G629 Polyneuropathy, unspecified: Secondary | ICD-10-CM

## 2019-11-07 DIAGNOSIS — R739 Hyperglycemia, unspecified: Secondary | ICD-10-CM

## 2019-11-07 DIAGNOSIS — R972 Elevated prostate specific antigen [PSA]: Secondary | ICD-10-CM

## 2019-11-07 DIAGNOSIS — I10 Essential (primary) hypertension: Secondary | ICD-10-CM | POA: Diagnosis not present

## 2019-11-07 LAB — BASIC METABOLIC PANEL
BUN: 21 mg/dL (ref 6–23)
CO2: 23 mEq/L (ref 19–32)
Calcium: 9.5 mg/dL (ref 8.4–10.5)
Chloride: 102 mEq/L (ref 96–112)
Creatinine, Ser: 1.43 mg/dL (ref 0.40–1.50)
GFR: 48.7 mL/min — ABNORMAL LOW (ref >60.00)
Glucose, Bld: 100 mg/dL — ABNORMAL HIGH (ref 70–99)
Potassium: 4.6 mEq/L (ref 3.5–5.1)
Sodium: 134 mEq/L — ABNORMAL LOW (ref 135–145)

## 2019-11-07 LAB — HEPATIC FUNCTION PANEL
ALT: 26 U/L (ref 0–53)
AST: 20 U/L (ref 0–37)
Albumin: 3.9 g/dL (ref 3.5–5.2)
Alkaline Phosphatase: 62 U/L (ref 39–117)
Bilirubin, Direct: 0.1 mg/dL (ref 0.0–0.3)
Total Bilirubin: 0.5 mg/dL (ref 0.2–1.2)
Total Protein: 7.6 g/dL (ref 6.0–8.3)

## 2019-11-07 LAB — HEMOGLOBIN A1C: Hgb A1c MFr Bld: 6.1 % (ref 4.6–6.5)

## 2019-11-07 MED ORDER — AMITRIPTYLINE HCL 10 MG PO TABS: 10.0000 mg | ORAL_TABLET | Freq: Every day | ORAL | 5 refills | Status: DC

## 2019-11-07 NOTE — Progress Notes (Signed)
Subjective:  Patient ID: Joe [name], male    DOB: 02-Nov-1945  Age: 74 y.o. MRN: [id number]  CC: No chief complaint on file.   HPI [facility] presents for DM, CAD, HTN f/u. Lost wt.Marland KitchenMarland KitchenJust had a food poisoning - [employer] food C/o neuropathy  Outpatient Medications Prior to Visit  Medication Sig Dispense Refill  . acetaminophen (TYLENOL) 325 MG tablet Take 2 tablets (650 mg total) by mouth every 6 (six) hours as needed for mild pain (or Fever >/= 101). 30 tablet 0  . ALPRAZolam (XANAX) 0.25 MG tablet Take 1 tablet (0.25 mg total) by mouth 3 (three) times daily as needed for anxiety. 30 tablet 0  . aspirin 81 MG tablet Take 81 mg by mouth daily.      . Cholecalciferol (VITAMIN D) 1000 UNITS capsule Take 1,000 Units by mouth daily.      . diclofenac sodium (VOLTAREN) 1 % GEL Apply 2 g topically 4 (four) times daily. 100 g 3  . fluticasone (FLONASE) 50 MCG/ACT nasal spray Place 2 sprays into both nostrils daily as needed for allergies. 16 g 11  . Ipratropium-Albuterol (COMBIVENT RESPIMAT) 20-100 MCG/ACT AERS Inhale 2 Act into the lungs 4 (four) times daily - after meals and at bedtime. (Patient taking differently: Inhale 2 Act into the lungs as needed for shortness of breath. ) 1 Inhaler 11  . linaclotide (LINZESS) 145 MCG CAPS capsule Take 1 capsule (145 mcg total) by mouth daily as needed. 30 capsule 11  . loratadine (CLARITIN) 10 MG tablet Take 1 tablet (10 mg total) by mouth daily. (Patient taking differently: Take 10 mg by mouth daily as needed. ) 30 tablet 11  . mirabegron ER (MYRBETRIQ) 50 MG TB24 tablet Take 1 tablet (50 mg total) by mouth daily. 30 tablet 11  . moxifloxacin (VIGAMOX) 0.5 % ophthalmic solution START AFTER SURGERY, ONE DROP 4 TIMES A DAY    . olmesartan (BENICAR) 40 MG tablet TAKE 1 TABLET BY MOUTH EVERY DAY 90 tablet 2  . prednisoLONE acetate (PRED FORTE) 1 % ophthalmic suspension START AFTER SURGERY, AS DIRECTED BY M.D. ON THE DAY OF SURGERY    . pyridoxine  (B-6) 100 MG tablet Take 100 mg by mouth daily.    [name] torsemide (DEMADEX) 20 MG tablet Take 1-2 tablets (20-40 mg total) by mouth daily. 90 tablet 3  . vitamin B-12 (CYANOCOBALAMIN) 1000 MCG tablet Take 1,500 mcg by mouth daily. 1 1/2 tab po qd      No facility-administered medications prior to visit.    ROS: Review of Systems  Constitutional: Positive for unexpected weight change. Negative for appetite change and fatigue.  HENT: Negative for congestion, nosebleeds, sneezing, sore throat and trouble swallowing.   Eyes: Negative for itching and visual disturbance.  Respiratory: Negative for cough.   Cardiovascular: Negative for chest pain, palpitations and leg swelling.  Gastrointestinal: Negative for abdominal distention, blood in stool, diarrhea and nausea.  Genitourinary: Negative for frequency and hematuria.  Musculoskeletal: Positive for arthralgias, back pain and gait problem. Negative for joint swelling and neck pain.  Skin: Negative for rash.  Neurological: Negative for dizziness, tremors, speech difficulty and weakness.  Psychiatric/Behavioral: Negative for agitation, dysphoric mood and sleep disturbance. The patient is not nervous/anxious.     Objective:  BP 140/62 (BP Location: Left Arm, Patient Position: Sitting, Cuff Size: Large)   Pulse 83   Temp 98.6 F (37 C) (Oral)   Ht 5\' 9"  (1.753 m)   Wt 278 lb (  126.1 kg)   SpO2 96%   BMI 41.05 kg/m   BP Readings from Last 3 Encounters:  11/07/19 140/62  08/08/19 132/72  05/09/19 136/78    Wt Readings from Last 3 Encounters:  11/07/19 278 lb (126.1 kg)  08/08/19 286 lb (129.7 kg)  05/09/19 283 lb (128.4 kg)    Physical Exam Constitutional:      General: He is not in acute distress.    Appearance: He is well-developed. He is obese.     Comments: NAD  Eyes:     Conjunctiva/sclera: Conjunctivae normal.     Pupils: Pupils are equal, round, and reactive to light.  Neck:     Thyroid: No thyromegaly.     Vascular: No  JVD.  Cardiovascular:     Rate and Rhythm: Normal rate and regular rhythm.     Heart sounds: Normal heart sounds. No murmur. No friction rub. No gallop.   Pulmonary:     Effort: Pulmonary effort is normal. No respiratory distress.     Breath sounds: Normal breath sounds. No wheezing or rales.  Chest:     Chest wall: No tenderness.  Abdominal:     General: Bowel sounds are normal. There is no distension.     Palpations: Abdomen is soft. There is no mass.     Tenderness: There is no abdominal tenderness. There is no guarding or rebound.  Musculoskeletal:        General: No tenderness. Normal range of motion.     Cervical back: Normal range of motion.  Lymphadenopathy:     Cervical: No cervical adenopathy.  Skin:    General: Skin is warm and dry.     Findings: No rash.  Neurological:     Mental Status: He is alert and oriented to person, place, and time.     Cranial Nerves: No cranial nerve deficit.     Motor: No abnormal muscle tone.     Coordination: Coordination normal.     Gait: Gait normal.     Deep Tendon Reflexes: Reflexes are normal and symmetric.  Psychiatric:        Behavior: Behavior normal.        Thought Content: Thought content normal.        Judgment: Judgment normal.    Ataxic  Lab Results  Component Value Date   WBC 8.2 05/09/2019   HGB 15.3 05/09/2019   HCT 44.3 05/09/2019   PLT 284.0 05/09/2019   GLUCOSE 100 (H) 08/08/2019   CHOL 163 05/09/2019   TRIG 100.0 05/09/2019   HDL 35.80 (L) 05/09/2019   LDLDIRECT 152.5 10/04/2009   LDLCALC 107 (H) 05/09/2019   ALT 36 08/08/2019   AST 24 08/08/2019   NA 135 08/08/2019   K 4.1 08/08/2019   CL 99 08/08/2019   CREATININE 1.19 08/08/2019   BUN 17 08/08/2019   CO2 26 08/08/2019   TSH 0.36 05/09/2019   PSA 5.04 (H) 08/08/2019   HGBA1C 6.1 11/02/2018    VAS US AORTA MEDICARE SCREEN  Result Date: 01/11/2018 ABDOMINAL AORTA STUDY Indications: Adominal bruit Risk         Hypertension, hyperlipidemia,  current smoker, coronary artery Factors:     disease.  Limitations: Air/bowel gas and obesity.  Examination Guidelines: A complete evaluation includes B-mode imaging, spectral doppler, color doppler, and power Doppler as needed of all accessible portions of each vessel. Bilateral testing is considered an integral part of a complete examination. Limited examinations for reoccurring indications may be performed  as noted.  Abdominal Aorta Findings: +-------------+-------+----------+----------+---------+--------+---------------+ Location     AP (cm)Trans (cm)PSV (cm/s)Waveform ThrombusComments        +-------------+-------+----------+----------+---------+--------+---------------+ Proximal     2.5    2.6       68        biphasic                         +-------------+-------+----------+----------+---------+--------+---------------+ Mid          2.1    2.1       104       triphasic                        +-------------+-------+----------+----------+---------+--------+---------------+ Distal       2.0    2.0       97        triphasic                        +-------------+-------+----------+----------+---------+--------+---------------+ RT CIA Prox  1.4    1.4       93        biphasic                         +-------------+-------+----------+----------+---------+--------+---------------+ RT CIA Mid                                               not visualized,                                                          bowel gas       +-------------+-------+----------+----------+---------+--------+---------------+ RT CIA Distal                 167       biphasic                         +-------------+-------+----------+----------+---------+--------+---------------+ RT EIA Prox  0.9    0.9       99        triphasic                        +-------------+-------+----------+----------+---------+--------+---------------+ LT CIA Prox  1.3    1.3                                   not visualized  +-------------+-------+----------+----------+---------+--------+---------------+ LT CIA Distal                 131       triphasic                        +-------------+-------+----------+----------+---------+--------+---------------+ LT EIA Prox  0.8    0.8       140       triphasic                        +-------------+-------+----------+----------+---------+--------+---------------+ Visualization of the Left CIA Proximal artery, Left CIA Mid  artery and Right CIA Mid artery was limited. Technically challenging exam due to overlying bowel gas and patient abdominal girth/obesity.  IVC/Iliac Findings: +--------+------+--------+-------+         PatentThrombusComment +--------+------+--------+-------+ IVC Proxpatent                +--------+------+--------+-------+  Final Interpretation: Technically challenging exam due to overlying bowel gas and patient abdominal girth/obesity. Abdominal Aorta: No evidence of an abdominal aortic aneurysm was visualized. The largest aortic measurement is 2.6 cm in the proximal aorta. Stenosis: Patent aorta and bilateral iliac arteries without evidence of significant stenosis.  *See table(s) above for measurements and observations.  Electronically signed by Quay Burow on 01/11/2018 at 4:42:59 PM.    Final    VAS US CAROTID  Result Date: 01/11/2018 Carotid Arterial Duplex Study Indications:       Bruit. Risk Factors:      Hypertension, hyperlipidemia, current smoker, coronary artery                    disease, PAD. Comparison Study:  No previous studies available for comparison. Examination Guidelines: A complete evaluation includes B-mode imaging, spectral Doppler, color Doppler, and power Doppler as needed of all accessible portions of each vessel. Bilateral testing is considered an integral part of a complete examination. Limited examinations for reoccurring indications may be performed as noted.  Right Carotid  Findings: +----------+--------+--------+--------+---------------------+--------+           PSV cm/sEDV cm/sStenosisDescribe             Comments +----------+--------+--------+--------+---------------------+--------+ CCA Prox  85      10                                            +----------+--------+--------+--------+---------------------+--------+ CCA Distal63      11                                            +----------+--------+--------+--------+---------------------+--------+ ICA Prox  101     24      1-39%   focal and hyperechoic         +----------+--------+--------+--------+---------------------+--------+ ICA Mid   73      16                                            +----------+--------+--------+--------+---------------------+--------+ ICA Distal69      17                                            +----------+--------+--------+--------+---------------------+--------+ ECA       155     19                                            +----------+--------+--------+--------+---------------------+--------+ +----------+--------+-------+----------------+-------------------+           PSV cm/sEDV cmsDescribe        Arm Pressure (mmHG) +----------+--------+-------+----------------+-------------------+ [id number]  Multiphasic, [id number]                 +----------+--------+-------+----------------+-------------------+ +---------+--------+--+--------+--+---------+ VertebralPSV cm/s48EDV cm/s13Antegrade +---------+--------+--+--------+--+---------+  Left Carotid Findings: +----------+--------+--------+--------+-----------+--------+           PSV cm/sEDV cm/sStenosisDescribe   Comments +----------+--------+--------+--------+-----------+--------+ CCA Prox  94      11                                  +----------+--------+--------+--------+-----------+--------+ CCA Distal62      13                                   +----------+--------+--------+--------+-----------+--------+ ICA Prox  43      13              hyperechoic         +----------+--------+--------+--------+-----------+--------+ ICA Mid   64      17      1-39%                       +----------+--------+--------+--------+-----------+--------+ ICA Distal82      22                         tortuous +----------+--------+--------+--------+-----------+--------+ ECA       123     13                                  +----------+--------+--------+--------+-----------+--------+ +----------+--------+--------+----------------+-------------------+ SubclavianPSV cm/sEDV cm/sDescribe        Arm Pressure (mmHG) +----------+--------+--------+----------------+-------------------+           188             Multiphasic, [id number]                 +----------+--------+--------+----------------+-------------------+ +---------+--------+--+--------+--+---------+ VertebralPSV cm/s40EDV cm/s11Antegrade +---------+--------+--+--------+--+---------+  Final Interpretation: Right Carotid: Velocities in the right ICA are consistent with a 1-39% stenosis. Left Carotid: Velocities in the left ICA are consistent with a 1-39% stenosis. Vertebrals:  Bilateral vertebral arteries demonstrate antegrade flow. Subclavians: Normal flow hemodynamics were seen in bilateral subclavian              arteries. *See table(s) above for measurements and observations.  Electronically signed by Quay Burow on 01/11/2018 at 4:41:47 PM.    Final     Assessment & Plan:

## 2019-11-07 NOTE — Addendum Note (Signed)
Addended by: Isaiah Serge D on: 11/07/2019 08:32 AM   Modules accepted: Orders

## 2019-11-07 NOTE — Assessment & Plan Note (Signed)
Statin intolerant 

## 2019-11-07 NOTE — Assessment & Plan Note (Signed)
Benicar and Torsemide

## 2019-11-07 NOTE — Assessment & Plan Note (Signed)
Trial of Amitriptyline

## 2019-11-08 LAB — PSA, TOTAL AND FREE
PSA, % Free: 9 % (calc) — ABNORMAL LOW (ref >25)
PSA, Free: 0.5 ng/mL
PSA, Total: 5.7 ng/mL — ABNORMAL HIGH (ref <=4.0)

## 2019-11-29 ENCOUNTER — Other Ambulatory Visit: Payer: Self-pay | Admitting: Internal Medicine

## 2019-12-04 DIAGNOSIS — R3915 Urgency of urination: Secondary | ICD-10-CM | POA: Diagnosis not present

## 2019-12-04 DIAGNOSIS — R350 Frequency of micturition: Secondary | ICD-10-CM | POA: Diagnosis not present

## 2020-02-07 ENCOUNTER — Encounter: Payer: Self-pay | Admitting: Internal Medicine

## 2020-02-07 ENCOUNTER — Other Ambulatory Visit: Payer: Self-pay

## 2020-02-07 ENCOUNTER — Ambulatory Visit (INDEPENDENT_AMBULATORY_CARE_PROVIDER_SITE_OTHER): Payer: Medicare Other | Admitting: Internal Medicine

## 2020-02-07 VITALS — BP 144/88 | HR 83 | Temp 98.4°F | Ht 69.0 in | Wt 277.0 lb

## 2020-02-07 DIAGNOSIS — I10 Essential (primary) hypertension: Secondary | ICD-10-CM

## 2020-02-07 DIAGNOSIS — R739 Hyperglycemia, unspecified: Secondary | ICD-10-CM

## 2020-02-07 DIAGNOSIS — C61 Malignant neoplasm of prostate: Secondary | ICD-10-CM | POA: Insufficient documentation

## 2020-02-07 DIAGNOSIS — E559 Vitamin D deficiency, unspecified: Secondary | ICD-10-CM

## 2020-02-07 DIAGNOSIS — E538 Deficiency of other specified B group vitamins: Secondary | ICD-10-CM

## 2020-02-07 DIAGNOSIS — E349 Endocrine disorder, unspecified: Secondary | ICD-10-CM | POA: Diagnosis not present

## 2020-02-07 DIAGNOSIS — E7800 Pure hypercholesterolemia, unspecified: Secondary | ICD-10-CM

## 2020-02-07 LAB — BASIC METABOLIC PANEL
BUN: 29 mg/dL — ABNORMAL HIGH (ref 6–23)
CO2: 25 mEq/L (ref 19–32)
Calcium: 9.5 mg/dL (ref 8.4–10.5)
Chloride: 101 mEq/L (ref 96–112)
Creatinine, Ser: 1.56 mg/dL — ABNORMAL HIGH (ref 0.40–1.50)
GFR: 44.02 mL/min — ABNORMAL LOW (ref >60.00)
Glucose, Bld: 103 mg/dL — ABNORMAL HIGH (ref 70–99)
Potassium: 4.4 mEq/L (ref 3.5–5.1)
Sodium: 134 mEq/L — ABNORMAL LOW (ref 135–145)

## 2020-02-07 LAB — HEMOGLOBIN A1C: Hgb A1c MFr Bld: 6.1 % (ref 4.6–6.5)

## 2020-02-07 NOTE — Assessment & Plan Note (Signed)
On B12 Chronic Risks associated with treatment noncompliance were discussed. Compliance was encouraged.  

## 2020-02-07 NOTE — Assessment & Plan Note (Signed)
Dr Wrenn 

## 2020-02-07 NOTE — Assessment & Plan Note (Signed)
Chronic  Risks associated with treatment noncompliance were discussed. Compliance was encouraged. Vit D

## 2020-02-07 NOTE — Assessment & Plan Note (Signed)
Discussed.

## 2020-02-07 NOTE — Addendum Note (Signed)
Addended by: Trenda Moots on: 8/61/6837 08:41 AM   Modules accepted: Orders

## 2020-02-07 NOTE — Assessment & Plan Note (Signed)
BP Readings from Last 3 Encounters:  02/07/20 (!) 144/88  11/07/19 140/62  08/08/19 132/72

## 2020-02-07 NOTE — Assessment & Plan Note (Signed)
Statin intolerant 

## 2020-02-07 NOTE — Progress Notes (Signed)
Subjective:  Patient ID: Joe [name], male    DOB: 10-27-1945  Age: 74 y.o. MRN: [id number]  CC: Annual Exam   HPI Joe [name] presents for HTN, tremor, LE weakness, elevated PSA.  COVID 19 vaccine was strongly advised - he refused  New prostate cancer - Dr Jeffie Pollock.    Outpatient Medications Prior to Visit  Medication Sig Dispense Refill  . acetaminophen (TYLENOL) 325 MG tablet Take 2 tablets (650 mg total) by mouth every 6 (six) hours as needed for mild pain (or Fever >/= 101). 30 tablet 0  . ALPRAZolam (XANAX) 0.25 MG tablet Take 1 tablet (0.25 mg total) by mouth 3 (three) times daily as needed for anxiety. 30 tablet 0  . amitriptyline (ELAVIL) 10 MG tablet TAKE 1-2 TABLETS (10-20 MG TOTAL) BY MOUTH AT BEDTIME. 180 tablet 3  . aspirin 81 MG tablet Take 81 mg by mouth daily.      . Cholecalciferol (VITAMIN D) 1000 UNITS capsule Take 1,000 Units by mouth daily.      . diclofenac sodium (VOLTAREN) 1 % GEL Apply 2 g topically 4 (four) times daily. 100 g 3  . fluticasone (FLONASE) 50 MCG/ACT nasal spray Place 2 sprays into both nostrils daily as needed for allergies. 16 g 11  . Ipratropium-Albuterol (COMBIVENT RESPIMAT) 20-100 MCG/ACT AERS Inhale 2 Act into the lungs 4 (four) times daily - after meals and at bedtime. (Patient taking differently: Inhale 2 Act into the lungs as needed for shortness of breath. ) 1 Inhaler 11  . linaclotide (LINZESS) 145 MCG CAPS capsule Take 1 capsule (145 mcg total) by mouth daily as needed. 30 capsule 11  . loratadine (CLARITIN) 10 MG tablet Take 1 tablet (10 mg total) by mouth daily. (Patient taking differently: Take 10 mg by mouth daily as needed. ) 30 tablet 11  . mirabegron ER (MYRBETRIQ) 50 MG TB24 tablet Take 1 tablet (50 mg total) by mouth daily. 30 tablet 11  . moxifloxacin (VIGAMOX) 0.5 % ophthalmic solution START AFTER SURGERY, ONE DROP 4 TIMES A DAY    . olmesartan (BENICAR) 40 MG tablet TAKE 1 TABLET BY MOUTH EVERY DAY 90 tablet 2  .  prednisoLONE acetate (PRED FORTE) 1 % ophthalmic suspension START AFTER SURGERY, AS DIRECTED BY M.D. ON THE DAY OF SURGERY    . pyridoxine (B-6) 100 MG tablet Take 100 mg by mouth daily.    [name] torsemide (DEMADEX) 20 MG tablet Take 1-2 tablets (20-40 mg total) by mouth daily. 90 tablet 3  . vitamin B-12 (CYANOCOBALAMIN) 1000 MCG tablet Take 1,500 mcg by mouth daily. 1 1/2 tab po qd      No facility-administered medications prior to visit.    ROS: Review of Systems  Constitutional: Positive for fatigue. Negative for appetite change and unexpected weight change.  HENT: Negative for congestion, nosebleeds, sneezing, sore throat and trouble swallowing.   Eyes: Negative for itching and visual disturbance.  Respiratory: Negative for cough.   Cardiovascular: Negative for chest pain, palpitations and leg swelling.  Gastrointestinal: Negative for abdominal distention, blood in stool, diarrhea and nausea.  Genitourinary: Positive for frequency and urgency. Negative for hematuria.  Musculoskeletal: Positive for back pain and gait problem. Negative for joint swelling and neck pain.  Skin: Negative for rash.  Neurological: Positive for dizziness and weakness. Negative for tremors and speech difficulty.  Psychiatric/Behavioral: Negative for agitation, dysphoric mood, sleep disturbance and suicidal ideas. The patient is not nervous/anxious.     Objective:  BP [name])  144/88   Pulse 83   Temp 98.4 F (36.9 C) (Oral)   Ht 5\' 9"  (1.753 m)   Wt 277 lb (125.6 kg)   SpO2 97%   BMI 40.91 kg/m   BP Readings from Last 3 Encounters:  02/07/20 (!) 144/88  11/07/19 140/62  08/08/19 132/72    Wt Readings from Last 3 Encounters:  02/07/20 277 lb (125.6 kg)  11/07/19 278 lb (126.1 kg)  08/08/19 286 lb (129.7 kg)    Physical Exam Constitutional:      General: He is not in acute distress.    Appearance: He is well-developed. He is obese.     Comments: NAD  Eyes:     Conjunctiva/sclera: Conjunctivae  normal.     Pupils: Pupils are equal, round, and reactive to light.  Neck:     Thyroid: No thyromegaly.     Vascular: No JVD.  Cardiovascular:     Rate and Rhythm: Normal rate and regular rhythm.     Heart sounds: Normal heart sounds. No murmur heard.  No friction rub. No gallop.   Pulmonary:     Effort: Pulmonary effort is normal. No respiratory distress.     Breath sounds: Normal breath sounds. No wheezing or rales.  Chest:     Chest wall: No tenderness.  Abdominal:     General: Bowel sounds are normal. There is no distension.     Palpations: Abdomen is soft. There is no mass.     Tenderness: There is no abdominal tenderness. There is no guarding or rebound.  Musculoskeletal:        General: Tenderness present. Normal range of motion.     Cervical back: Normal range of motion.  Lymphadenopathy:     Cervical: No cervical adenopathy.  Skin:    General: Skin is warm and dry.     Findings: No rash.  Neurological:     Mental Status: He is alert and oriented to person, place, and time.     Cranial Nerves: No cranial nerve deficit.     Motor: Weakness present. No abnormal muscle tone.     Coordination: Coordination abnormal.     Gait: Gait abnormal.     Deep Tendon Reflexes: Reflexes are normal and symmetric.  Psychiatric:        Behavior: Behavior normal.        Thought Content: Thought content normal.        Judgment: Judgment normal.     Lab Results  Component Value Date   WBC 8.2 05/09/2019   HGB 15.3 05/09/2019   HCT 44.3 05/09/2019   PLT 284.0 05/09/2019   GLUCOSE 100 (H) 11/07/2019   CHOL 163 05/09/2019   TRIG 100.0 05/09/2019   HDL 35.80 (L) 05/09/2019   LDLDIRECT 152.5 10/04/2009   LDLCALC 107 (H) 05/09/2019   ALT 26 11/07/2019   AST 20 11/07/2019   NA 134 (L) 11/07/2019   K 4.6 11/07/2019   CL 102 11/07/2019   CREATININE 1.43 11/07/2019   BUN 21 11/07/2019   CO2 23 11/07/2019   TSH 0.36 05/09/2019   PSA 5.04 (H) 08/08/2019   HGBA1C 6.1 11/07/2019     VAS US AORTA MEDICARE SCREEN  Result Date: 01/11/2018 ABDOMINAL AORTA STUDY Indications: Adominal bruit Risk         Hypertension, hyperlipidemia, current smoker, coronary artery Factors:     disease.  Limitations: Air/bowel gas and obesity.  Examination Guidelines: A complete evaluation includes B-mode imaging, spectral doppler, color doppler, and  power Doppler as needed of all accessible portions of each vessel. Bilateral testing is considered an integral part of a complete examination. Limited examinations for reoccurring indications may be performed as noted.  Abdominal Aorta Findings: +-------------+-------+----------+----------+---------+--------+---------------+ Location     AP (cm)Trans (cm)PSV (cm/s)Waveform ThrombusComments        +-------------+-------+----------+----------+---------+--------+---------------+ Proximal     2.5    2.6       68        biphasic                         +-------------+-------+----------+----------+---------+--------+---------------+ Mid          2.1    2.1       104       triphasic                        +-------------+-------+----------+----------+---------+--------+---------------+ Distal       2.0    2.0       97        triphasic                        +-------------+-------+----------+----------+---------+--------+---------------+ RT CIA Prox  1.4    1.4       93        biphasic                         +-------------+-------+----------+----------+---------+--------+---------------+ RT CIA Mid                                               not visualized,                                                          bowel gas       +-------------+-------+----------+----------+---------+--------+---------------+ RT CIA Distal                 167       biphasic                         +-------------+-------+----------+----------+---------+--------+---------------+ RT EIA Prox  0.9    0.9       99         triphasic                        +-------------+-------+----------+----------+---------+--------+---------------+ LT CIA Prox  1.3    1.3                                  not visualized  +-------------+-------+----------+----------+---------+--------+---------------+ LT CIA Distal                 131       triphasic                        +-------------+-------+----------+----------+---------+--------+---------------+ LT EIA Prox  0.8    0.8       140       triphasic                        +-------------+-------+----------+----------+---------+--------+---------------+  Visualization of the Left CIA Proximal artery, Left CIA Mid artery and Right CIA Mid artery was limited. Technically challenging exam due to overlying bowel gas and patient abdominal girth/obesity.  IVC/Iliac Findings: +--------+------+--------+-------+         PatentThrombusComment +--------+------+--------+-------+ IVC Proxpatent                +--------+------+--------+-------+  Final Interpretation: Technically challenging exam due to overlying bowel gas and patient abdominal girth/obesity. Abdominal Aorta: No evidence of an abdominal aortic aneurysm was visualized. The largest aortic measurement is 2.6 cm in the proximal aorta. Stenosis: Patent aorta and bilateral iliac arteries without evidence of significant stenosis.  *See table(s) above for measurements and observations.  Electronically signed by Quay Burow on 01/11/2018 at 4:42:59 PM.    Final    VAS US CAROTID  Result Date: 01/11/2018 Carotid Arterial Duplex Study Indications:       Bruit. Risk Factors:      Hypertension, hyperlipidemia, current smoker, coronary artery                    disease, PAD. Comparison Study:  No previous studies available for comparison. Examination Guidelines: A complete evaluation includes B-mode imaging, spectral Doppler, color Doppler, and power Doppler as needed of all accessible portions of each vessel. Bilateral  testing is considered an integral part of a complete examination. Limited examinations for reoccurring indications may be performed as noted.  Right Carotid Findings: +----------+--------+--------+--------+---------------------+--------+           PSV cm/sEDV cm/sStenosisDescribe             Comments +----------+--------+--------+--------+---------------------+--------+ CCA Prox  85      10                                            +----------+--------+--------+--------+---------------------+--------+ CCA Distal63      11                                            +----------+--------+--------+--------+---------------------+--------+ ICA Prox  101     24      1-39%   focal and hyperechoic         +----------+--------+--------+--------+---------------------+--------+ ICA Mid   73      16                                            +----------+--------+--------+--------+---------------------+--------+ ICA Distal69      17                                            +----------+--------+--------+--------+---------------------+--------+ ECA       155     19                                            +----------+--------+--------+--------+---------------------+--------+ +----------+--------+-------+----------------+-------------------+           PSV cm/sEDV cmsDescribe        Arm Pressure (mmHG) +----------+--------+-------+----------------+-------------------+  Subclavian181            Multiphasic, [id number]                 +----------+--------+-------+----------------+-------------------+ +---------+--------+--+--------+--+---------+ VertebralPSV cm/s48EDV cm/s13Antegrade +---------+--------+--+--------+--+---------+  Left Carotid Findings: +----------+--------+--------+--------+-----------+--------+           PSV cm/sEDV cm/sStenosisDescribe   Comments +----------+--------+--------+--------+-----------+--------+ CCA Prox  94      11                                   +----------+--------+--------+--------+-----------+--------+ CCA Distal62      13                                  +----------+--------+--------+--------+-----------+--------+ ICA Prox  43      13              hyperechoic         +----------+--------+--------+--------+-----------+--------+ ICA Mid   64      17      1-39%                       +----------+--------+--------+--------+-----------+--------+ ICA Distal82      22                         tortuous +----------+--------+--------+--------+-----------+--------+ ECA       123     13                                  +----------+--------+--------+--------+-----------+--------+ +----------+--------+--------+----------------+-------------------+ SubclavianPSV cm/sEDV cm/sDescribe        Arm Pressure (mmHG) +----------+--------+--------+----------------+-------------------+           188             Multiphasic, [id number]                 +----------+--------+--------+----------------+-------------------+ +---------+--------+--+--------+--+---------+ VertebralPSV cm/s40EDV cm/s11Antegrade +---------+--------+--+--------+--+---------+  Final Interpretation: Right Carotid: Velocities in the right ICA are consistent with a 1-39% stenosis. Left Carotid: Velocities in the left ICA are consistent with a 1-39% stenosis. Vertebrals:  Bilateral vertebral arteries demonstrate antegrade flow. Subclavians: Normal flow hemodynamics were seen in bilateral subclavian              arteries. *See table(s) above for measurements and observations.  Electronically signed by Quay Burow on 01/11/2018 at 4:41:47 PM.    Final     Assessment & Plan:    Walker Kehr, MD

## 2020-02-08 DIAGNOSIS — E230 Hypopituitarism: Secondary | ICD-10-CM | POA: Diagnosis not present

## 2020-02-22 ENCOUNTER — Other Ambulatory Visit (HOSPITAL_COMMUNITY): Payer: Self-pay | Admitting: Urology

## 2020-02-22 DIAGNOSIS — C61 Malignant neoplasm of prostate: Secondary | ICD-10-CM

## 2020-02-29 ENCOUNTER — Ambulatory Visit (HOSPITAL_COMMUNITY)
Admission: RE | Admit: 2020-02-29 | Discharge: 2020-02-29 | Disposition: A | Discharge status: Home / Self Care | Payer: Medicare Other | Source: Ambulatory Visit | Attending: Urology | Admitting: Urology

## 2020-02-29 ENCOUNTER — Other Ambulatory Visit: Payer: Self-pay

## 2020-02-29 DIAGNOSIS — C61 Malignant neoplasm of prostate: Secondary | ICD-10-CM | POA: Diagnosis present

## 2020-02-29 DIAGNOSIS — I700 Atherosclerosis of aorta: Secondary | ICD-10-CM | POA: Diagnosis not present

## 2020-02-29 MED ORDER — AXUMIN (FLUCICLOVINE F 18) INJECTION
9.6100 | Freq: Once | INTRAVENOUS | Status: AC
  Administered 2020-02-29: 9.61 via INTRAVENOUS

## 2020-04-04 NOTE — Progress Notes (Signed)
GU Location of Tumor / Histology: prostatic adenocarcinoma  If Prostate Cancer, Gleason Score is (3 + 4) and PSA is (5.7). Prostate volume: 35 mL.   [name] presented with a rising PSA.  Biopsies of prostate (if applicable) revealed:    Past/Anticipated interventions by urology, if any: Prostate biopsy, PET scan, referral for consideration of seeds  Past/Anticipated interventions by medical oncology, if any: no  Weight changes, if any: no  Bowel/Bladder complaints, if any:    Nausea/Vomiting, if any: no  Pain issues, if any:  History of low back surgery.  SAFETY ISSUES:  Prior radiation?   Pacemaker/ICD?   Possible current pregnancy? no, male patient  Is the patient on methotrexate?   Current Complaints / other details:  74 year old male. Single with 1 daughter and 2 sons. Retired. Smokes one ppd.

## 2020-04-09 ENCOUNTER — Other Ambulatory Visit: Payer: Self-pay | Admitting: Urology

## 2020-04-09 ENCOUNTER — Telehealth: Payer: Self-pay | Admitting: *Deleted

## 2020-04-09 ENCOUNTER — Encounter: Payer: Self-pay | Admitting: Medical Oncology

## 2020-04-09 ENCOUNTER — Encounter: Payer: Self-pay | Admitting: Radiation Oncology

## 2020-04-09 ENCOUNTER — Ambulatory Visit
Admission: RE | Admit: 2020-04-09 | Discharge: 2020-04-09 | Disposition: A | Discharge status: Home / Self Care | Payer: Medicare Other | Source: Ambulatory Visit | Attending: Radiation Oncology | Admitting: Radiation Oncology

## 2020-04-09 ENCOUNTER — Other Ambulatory Visit: Payer: Self-pay

## 2020-04-09 VITALS — BP 126/54 | HR 68 | Temp 98.4°F | Resp 18 | Wt 274.0 lb

## 2020-04-09 DIAGNOSIS — I10 Essential (primary) hypertension: Secondary | ICD-10-CM | POA: Insufficient documentation

## 2020-04-09 DIAGNOSIS — C61 Malignant neoplasm of prostate: Secondary | ICD-10-CM

## 2020-04-09 DIAGNOSIS — I2510 Atherosclerotic heart disease of native coronary artery without angina pectoris: Secondary | ICD-10-CM | POA: Insufficient documentation

## 2020-04-09 DIAGNOSIS — R251 Tremor, unspecified: Secondary | ICD-10-CM | POA: Diagnosis not present

## 2020-04-09 DIAGNOSIS — Z79899 Other long term (current) drug therapy: Secondary | ICD-10-CM | POA: Diagnosis not present

## 2020-04-09 DIAGNOSIS — F17210 Nicotine dependence, cigarettes, uncomplicated: Secondary | ICD-10-CM | POA: Diagnosis not present

## 2020-04-09 DIAGNOSIS — R42 Dizziness and giddiness: Secondary | ICD-10-CM | POA: Insufficient documentation

## 2020-04-09 DIAGNOSIS — E785 Hyperlipidemia, unspecified: Secondary | ICD-10-CM | POA: Insufficient documentation

## 2020-04-09 DIAGNOSIS — I739 Peripheral vascular disease, unspecified: Secondary | ICD-10-CM | POA: Diagnosis not present

## 2020-04-09 DIAGNOSIS — J449 Chronic obstructive pulmonary disease, unspecified: Secondary | ICD-10-CM | POA: Diagnosis not present

## 2020-04-09 DIAGNOSIS — R9721 Rising PSA following treatment for malignant neoplasm of prostate: Secondary | ICD-10-CM | POA: Insufficient documentation

## 2020-04-09 DIAGNOSIS — M1990 Unspecified osteoarthritis, unspecified site: Secondary | ICD-10-CM | POA: Diagnosis not present

## 2020-04-09 DIAGNOSIS — Z7982 Long term (current) use of aspirin: Secondary | ICD-10-CM | POA: Insufficient documentation

## 2020-04-09 DIAGNOSIS — Z808 Family history of malignant neoplasm of other organs or systems: Secondary | ICD-10-CM | POA: Insufficient documentation

## 2020-04-09 NOTE — Progress Notes (Signed)
Introduced myself to patient and his fiance Rodena Piety, as the prostate nurse navigator and discussed my role. He states after reading and discussing with Dr. Jeffie Pollock, he is most interested in brachytherapy. I answered his questions about how the procedure is done and post surgical side effects. No barriers to care identified at this time. I gave him my business card and asked him to call me with questions or concerns. He voiced understanding.

## 2020-04-09 NOTE — Telephone Encounter (Signed)
Called patient to inform of pre-seed appts. and his implant, lvm for a return call
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
## 2020-04-09 NOTE — Progress Notes (Signed)
GU Location of Tumor / Histology: prostatic adenocarcinoma  If Prostate Cancer, Gleason Score is (3 + 4) and PSA is (5.7). Prostate volume: 35 mL.   Joe [name] presented with a rising PSA.  Biopsies of prostate (if applicable) revealed:    Past/Anticipated interventions by urology, if any: Prostate biopsy, PET scan, referral for consideration of seeds  Past/Anticipated interventions by medical oncology, if any: no  Weight changes, if any: no  Bowel/Bladder complaints, if any:  IBS  Nausea/Vomiting, if any: Some nausea  Pain issues, if any:  Inner ear  SAFETY ISSUES:  Prior radiation? No  Pacemaker/ICD? No    Possible current pregnancy? no, male patient  Is the patient on methotrexate? No  Current Complaints / other details:  74 year old male. Single with 1 daughter and 2 sons. Retired. Smokes one ppd. Vitals:   04/09/20 0856  BP: (!) 126/54  Pulse: 68  Resp: 18  Temp: 98.4 F (36.9 C)  TempSrc: Oral  SpO2: 100%  Weight: 124.3 kg

## 2020-05-09 ENCOUNTER — Ambulatory Visit (INDEPENDENT_AMBULATORY_CARE_PROVIDER_SITE_OTHER): Payer: Medicare Other | Admitting: Internal Medicine

## 2020-05-09 ENCOUNTER — Encounter: Payer: Self-pay | Admitting: Internal Medicine

## 2020-05-09 ENCOUNTER — Other Ambulatory Visit: Payer: Self-pay

## 2020-05-09 VITALS — BP 162/70 | HR 75 | Temp 98.5°F | Ht 69.0 in | Wt 276.0 lb

## 2020-05-09 DIAGNOSIS — R739 Hyperglycemia, unspecified: Secondary | ICD-10-CM

## 2020-05-09 DIAGNOSIS — T466X5A Adverse effect of antihyperlipidemic and antiarteriosclerotic drugs, initial encounter: Secondary | ICD-10-CM

## 2020-05-09 DIAGNOSIS — R42 Dizziness and giddiness: Secondary | ICD-10-CM

## 2020-05-09 DIAGNOSIS — E785 Hyperlipidemia, unspecified: Secondary | ICD-10-CM

## 2020-05-09 DIAGNOSIS — E538 Deficiency of other specified B group vitamins: Secondary | ICD-10-CM | POA: Diagnosis not present

## 2020-05-09 DIAGNOSIS — I10 Essential (primary) hypertension: Secondary | ICD-10-CM

## 2020-05-09 DIAGNOSIS — G720 Drug-induced myopathy: Secondary | ICD-10-CM | POA: Diagnosis not present

## 2020-05-09 MED ORDER — MECLIZINE HCL 12.5 MG PO TABS: 12.5000 mg | ORAL_TABLET | Freq: Three times a day (TID) | ORAL | 1 refills | Status: AC | PRN

## 2020-05-09 NOTE — Addendum Note (Signed)
Addended by: Hazle Quant on: 05/09/2020 09:14 AM   Modules accepted: Orders

## 2020-05-09 NOTE — Assessment & Plan Note (Addendum)
BP Readings from Last 3 Encounters:  05/09/20 (!) 162/70  04/09/20 (!) 126/54  02/07/20 (!) 144/88    BP nl at home per pt

## 2020-05-09 NOTE — Assessment & Plan Note (Signed)
On B12 

## 2020-05-09 NOTE — Progress Notes (Signed)
Subjective:  Patient ID: Joe [name], male    DOB: 09/05/1945  Age: 74 y.o. MRN: [id number]  CC: No chief complaint on file.   HPI Joe [name] presents for HTN, LBP, gait disorder  Advised to get COVID19 shot ASAP - refused   Outpatient Medications Prior to Visit  Medication Sig Dispense Refill  . acetaminophen (TYLENOL) 325 MG tablet Take 2 tablets (650 mg total) by mouth every 6 (six) hours as needed for mild pain (or Fever >/= 101). 30 tablet 0  . ALPRAZolam (XANAX) 0.25 MG tablet Take 1 tablet (0.25 mg total) by mouth 3 (three) times daily as needed for anxiety. 30 tablet 0  . amitriptyline (ELAVIL) 10 MG tablet TAKE 1-2 TABLETS (10-20 MG TOTAL) BY MOUTH AT BEDTIME. 180 tablet 3  . aspirin 81 MG tablet Take 81 mg by mouth daily.      . Cholecalciferol (VITAMIN D) 1000 UNITS capsule Take 1,000 Units by mouth daily.      . diclofenac sodium (VOLTAREN) 1 % GEL Apply 2 g topically 4 (four) times daily. 100 g 3  . fluticasone (FLONASE) 50 MCG/ACT nasal spray Place 2 sprays into both nostrils daily as needed for allergies. 16 g 11  . Ipratropium-Albuterol (COMBIVENT RESPIMAT) 20-100 MCG/ACT AERS Inhale 2 Act into the lungs 4 (four) times daily - after meals and at bedtime. (Patient taking differently: Inhale 2 Act into the lungs as needed for shortness of breath. ) 1 Inhaler 11  . linaclotide (LINZESS) 145 MCG CAPS capsule Take 1 capsule (145 mcg total) by mouth daily as needed. 30 capsule 11  . loratadine (CLARITIN) 10 MG tablet Take 1 tablet (10 mg total) by mouth daily. (Patient taking differently: Take 10 mg by mouth daily as needed. ) 30 tablet 11  . mirabegron ER (MYRBETRIQ) 50 MG TB24 tablet Take 1 tablet (50 mg total) by mouth daily. 30 tablet 11  . moxifloxacin (VIGAMOX) 0.5 % ophthalmic solution START AFTER SURGERY, ONE DROP 4 TIMES A DAY    . olmesartan (BENICAR) 40 MG tablet TAKE 1 TABLET BY MOUTH EVERY DAY 90 tablet 2  . prednisoLONE acetate (PRED FORTE) 1 %  ophthalmic suspension START AFTER SURGERY, AS DIRECTED BY M.D. ON THE DAY OF SURGERY    . pyridoxine (B-6) 100 MG tablet Take 100 mg by mouth daily.    [name] torsemide (DEMADEX) 20 MG tablet Take 1-2 tablets (20-40 mg total) by mouth daily. 90 tablet 3  . vitamin B-12 (CYANOCOBALAMIN) 1000 MCG tablet Take 1,500 mcg by mouth daily. 1 1/2 tab po qd      No facility-administered medications prior to visit.    ROS: Review of Systems  Constitutional: Positive for unexpected weight change. Negative for appetite change and fatigue.  HENT: Negative for congestion, nosebleeds, sneezing, sore throat and trouble swallowing.   Eyes: Negative for itching and visual disturbance.  Respiratory: Negative for cough.   Cardiovascular: Negative for chest pain, palpitations and leg swelling.  Gastrointestinal: Negative for abdominal distention, blood in stool, diarrhea and nausea.  Genitourinary: Negative for frequency and hematuria.  Musculoskeletal: Positive for arthralgias, back pain and gait problem. Negative for joint swelling and neck pain.  Skin: Negative for rash.  Neurological: Positive for dizziness. Negative for tremors, speech difficulty and weakness.  Psychiatric/Behavioral: Negative for agitation, dysphoric mood and sleep disturbance. The patient is not nervous/anxious.     Objective:  BP (!) 162/70 (BP Location: Left Arm, Patient Position: Sitting, Cuff Size: Large)  Pulse 75   Temp 98.5 F (36.9 C) (Oral)   Ht 5\' 9"  (1.753 m)   Wt 276 lb (125.2 kg)   SpO2 98%   BMI 40.76 kg/m   BP Readings from Last 3 Encounters:  05/09/20 (!) 162/70  04/09/20 (!) 126/54  02/07/20 (!) 144/88    Wt Readings from Last 3 Encounters:  05/09/20 276 lb (125.2 kg)  04/09/20 274 lb (124.3 kg)  02/07/20 277 lb (125.6 kg)    Physical Exam Vitals reviewed.  Constitutional:      General: He is not in acute distress.    Appearance: He is well-developed. He is obese.     Comments: NAD  Eyes:      Conjunctiva/sclera: Conjunctivae normal.     Pupils: Pupils are equal, round, and reactive to light.  Neck:     Thyroid: No thyromegaly.     Vascular: No JVD.  Cardiovascular:     Rate and Rhythm: Normal rate and regular rhythm.     Heart sounds: Normal heart sounds. No murmur heard.  No friction rub. No gallop.   Pulmonary:     Effort: Pulmonary effort is normal. No respiratory distress.     Breath sounds: Normal breath sounds. No wheezing or rales.  Chest:     Chest wall: No tenderness.  Abdominal:     General: Bowel sounds are normal. There is no distension.     Palpations: Abdomen is soft. There is no mass.     Tenderness: There is no abdominal tenderness. There is no guarding or rebound.  Musculoskeletal:        General: No tenderness. Normal range of motion.     Cervical back: Normal range of motion.  Lymphadenopathy:     Cervical: No cervical adenopathy.  Skin:    General: Skin is warm and dry.     Findings: No rash.  Neurological:     Mental Status: He is alert and oriented to person, place, and time.     Cranial Nerves: No cranial nerve deficit.     Motor: Weakness present. No abnormal muscle tone.     Coordination: Coordination abnormal.     Gait: Gait abnormal.     Deep Tendon Reflexes: Reflexes are normal and symmetric.  Psychiatric:        Behavior: Behavior normal.        Thought Content: Thought content normal.        Judgment: Judgment normal.    Walker Hunched over a walker gait Stiff LS   Lab Results  Component Value Date   WBC 8.2 05/09/2019   HGB 15.3 05/09/2019   HCT 44.3 05/09/2019   PLT 284.0 05/09/2019   GLUCOSE 103 (H) 02/07/2020   CHOL 163 05/09/2019   TRIG 100.0 05/09/2019   HDL 35.80 (L) 05/09/2019   LDLDIRECT 152.5 10/04/2009   LDLCALC 107 (H) 05/09/2019   ALT 26 11/07/2019   AST 20 11/07/2019   NA 134 (L) 02/07/2020   K 4.4 02/07/2020   CL 101 02/07/2020   CREATININE 1.56 (H) 02/07/2020   BUN 29 (H) 02/07/2020   CO2 25  02/07/2020   TSH 0.36 05/09/2019   PSA 5.04 (H) 08/08/2019   HGBA1C 6.1 02/07/2020    No results found.  Assessment & Plan:

## 2020-05-09 NOTE — Assessment & Plan Note (Signed)
Off statins 

## 2020-05-09 NOTE — Assessment & Plan Note (Signed)
Meclizine prn 

## 2020-05-10 LAB — COMPLETE METABOLIC PANEL WITH GFR
AG Ratio: 1.5 (calc) (ref 1.0–2.5)
ALT: 36 U/L (ref 9–46)
AST: 24 U/L (ref 10–35)
Albumin: 4 g/dL (ref 3.6–5.1)
Alkaline phosphatase (APISO): 71 U/L (ref 35–144)
BUN/Creatinine Ratio: 15 (calc) (ref 6–22)
BUN: 24 mg/dL (ref 7–25)
CO2: 22 mmol/L (ref 20–32)
Calcium: 9.1 mg/dL (ref 8.6–10.3)
Chloride: 105 mmol/L (ref 98–110)
Creat: 1.55 mg/dL — ABNORMAL HIGH (ref 0.70–1.18)
GFR, Est African American: 51 mL/min/{1.73_m2} — ABNORMAL LOW (ref >=60)
GFR, Est Non African American: 44 mL/min/{1.73_m2} — ABNORMAL LOW (ref >=60)
Globulin: 2.7 g/dL (calc) (ref 1.9–3.7)
Glucose, Bld: 90 mg/dL (ref 65–99)
Potassium: 4.5 mmol/L (ref 3.5–5.3)
Sodium: 136 mmol/L (ref 135–146)
Total Bilirubin: 0.8 mg/dL (ref 0.2–1.2)
Total Protein: 6.7 g/dL (ref 6.1–8.1)

## 2020-05-10 LAB — HEMOGLOBIN A1C
Hgb A1c MFr Bld: 5.8 % of total Hgb — ABNORMAL HIGH (ref <5.7)
Mean Plasma Glucose: 120 (calc)
eAG (mmol/L): 6.6 (calc)

## 2020-05-10 LAB — LIPID PANEL
Cholesterol: 159 mg/dL (ref <200)
HDL: 34 mg/dL — ABNORMAL LOW (ref >=40)
LDL Cholesterol (Calc): 103 mg/dL (calc) — ABNORMAL HIGH
Non-HDL Cholesterol (Calc): 125 mg/dL (calc) (ref <130)
Total CHOL/HDL Ratio: 4.7 (calc) (ref <5.0)
Triglycerides: 121 mg/dL (ref <150)

## 2020-05-10 LAB — TSH: TSH: 0.2 mIU/L — ABNORMAL LOW (ref 0.40–4.50)

## 2020-05-13 ENCOUNTER — Telehealth: Payer: Self-pay | Admitting: *Deleted

## 2020-05-13 NOTE — Telephone Encounter (Signed)
RETURNED PATIENT'S PHONE CALL. ?

## 2020-05-15 ENCOUNTER — Telehealth: Payer: Self-pay | Admitting: *Deleted

## 2020-05-15 NOTE — Telephone Encounter (Signed)
CALLED PATIENT TO REMIND OF PRE-SEED APPTS. FOR 05-16-20, SPOKE WITH PATIENT AND HE IS AWARE OF THESE APPTS.

## 2020-05-16 ENCOUNTER — Other Ambulatory Visit: Payer: Self-pay

## 2020-05-16 ENCOUNTER — Ambulatory Visit (HOSPITAL_COMMUNITY)
Admission: RE | Admit: 2020-05-16 | Discharge: 2020-05-16 | Disposition: A | Discharge status: Home / Self Care | Payer: Medicare Other | Source: Ambulatory Visit | Attending: Urology | Admitting: Urology

## 2020-05-16 ENCOUNTER — Ambulatory Visit
Admission: RE | Admit: 2020-05-16 | Discharge: 2020-05-16 | Disposition: A | Discharge status: Home / Self Care | Payer: Medicare Other | Source: Ambulatory Visit | Attending: Urology | Admitting: Urology

## 2020-05-16 ENCOUNTER — Encounter: Payer: Self-pay | Admitting: Medical Oncology

## 2020-05-16 ENCOUNTER — Encounter (HOSPITAL_COMMUNITY)
Admission: RE | Admit: 2020-05-16 | Discharge: 2020-05-16 | Disposition: A | Discharge status: Home / Self Care | Payer: Medicare Other | Source: Ambulatory Visit | Attending: Urology | Admitting: Urology

## 2020-05-16 ENCOUNTER — Ambulatory Visit
Admission: RE | Admit: 2020-05-16 | Discharge: 2020-05-16 | Disposition: A | Discharge status: Home / Self Care | Payer: Medicare Other | Source: Ambulatory Visit | Attending: Radiation Oncology | Admitting: Radiation Oncology

## 2020-05-16 DIAGNOSIS — C61 Malignant neoplasm of prostate: Secondary | ICD-10-CM | POA: Diagnosis not present

## 2020-05-16 DIAGNOSIS — Z01818 Encounter for other preprocedural examination: Secondary | ICD-10-CM | POA: Diagnosis not present

## 2020-05-16 NOTE — Progress Notes (Signed)
  Radiation Oncology         [phone] ________________________________  Name: JUBAL RADEMAKER MRN: [id number]  Date: 05/16/2020  DOB: 03-Oct-1945  SIMULATION AND TREATMENT PLANNING NOTE PUBIC ARCH STUDY  KT:GYBWLSLHT, Evie Lacks, MD  Irine Seal, MD  DIAGNOSIS: 74 y.o. gentleman with Stage T1c adenocarcinoma of the prostate with Gleason score of 3+4, and PSA of 5.7.  Oncology History  Malignant neoplasm of prostate ([place])  01/08/2020 Cancer Staging   Staging form: Prostate, AJCC 8th Edition - Clinical stage from 01/08/2020: Stage IIB (cT1c, cN0, cM0, PSA: 5.7, Grade Group: 2) - Signed by Freeman Caldron, PA-C on 04/09/2020   02/07/2020 Initial Diagnosis   Malignant neoplasm of prostate ([place])       ICD-10-CM   1. Malignant neoplasm of prostate ([place])  C61     COMPLEX SIMULATION:  The patient presented today for evaluation for possible prostate seed implant. He was brought to the radiation planning suite and placed supine on the CT couch. A 3-dimensional image study set was obtained in upload to the planning computer. There, on each axial slice, I contoured the prostate gland. Then, using three-dimensional radiation planning tools I reconstructed the prostate in view of the structures from the transperineal needle pathway to assess for possible pubic arch interference. In doing so, I did not appreciate any pubic arch interference. Also, the patient's prostate volume was estimated based on the drawn structure. The volume was 33 cc.  Given the pubic arch appearance and prostate volume, patient remains a good candidate to proceed with prostate seed implant. Today, he freely provided informed written consent to proceed.    PLAN: The patient will undergo prostate seed implant.   ________________________________  Sheral Apley. Tammi Klippel, M.D.
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
## 2020-06-19 ENCOUNTER — Ambulatory Visit: Payer: Medicare Other | Admitting: Cardiology

## 2020-06-19 ENCOUNTER — Other Ambulatory Visit: Payer: Self-pay

## 2020-06-19 ENCOUNTER — Encounter: Payer: Self-pay | Admitting: Cardiology

## 2020-06-19 VITALS — BP 158/69 | HR 77 | Ht 69.0 in | Wt 276.8 lb

## 2020-06-19 DIAGNOSIS — R011 Cardiac murmur, unspecified: Secondary | ICD-10-CM | POA: Diagnosis not present

## 2020-06-19 DIAGNOSIS — E7800 Pure hypercholesterolemia, unspecified: Secondary | ICD-10-CM | POA: Diagnosis not present

## 2020-06-19 DIAGNOSIS — I25810 Atherosclerosis of coronary artery bypass graft(s) without angina pectoris: Secondary | ICD-10-CM | POA: Diagnosis not present

## 2020-06-19 DIAGNOSIS — I10 Essential (primary) hypertension: Secondary | ICD-10-CM

## 2020-06-19 NOTE — Patient Instructions (Signed)

## 2020-07-01 ENCOUNTER — Encounter (HOSPITAL_BASED_OUTPATIENT_CLINIC_OR_DEPARTMENT_OTHER): Payer: Self-pay | Admitting: Urology

## 2020-07-01 ENCOUNTER — Other Ambulatory Visit: Payer: Self-pay

## 2020-07-01 ENCOUNTER — Telehealth: Payer: Self-pay | Admitting: *Deleted

## 2020-07-01 NOTE — Progress Notes (Signed)
Spoke w/ via phone for pre-op interview---pt Lab needs dos---- none              Lab results------has lab appt 07-09-2020 830 am COVID test ------07-05-2020 950 am Arrive at -------1015 am 07-09-2020 NPO after MN NO Solid Food.  Clear liquids from MN until---915 am Medications to take morning of surgery -----bring combivent inhaler Diabetic medication -----n/a Patient Special Instructions -----pt stated he would be doing linzess qod for a few days before surgery and would not be doing fleets enema am of surgery, called pam gibson to make dr Jeffie Pollock aware Pre-Op special Istructions -----none Patient verbalized understanding of instructions that were given at this phone interview. Patient denies shortness of breath, chest pain, fever, cough at this phone interview.  Anesthesia review: no cbag x 4, having echo for cardiac murmur 08-01-2020  PCP: dr Henderson [place] Cardiologist :dr Kathyrn Drown 06-19-2020 Chest x-ray :05-16-2020 EKG :05-16-2020 Echo :scheduled for 08-01-2020  Stress test:12-14-2012 epic Cardiac Cath : none Activity level: climbs steps slowly cannot walk long distances, uses walker for short distances, and wheelchair for long distances Sleep Study/ CPAP :n/a Fasting Blood Sugar :      / Checks Blood Sugar -- times a day:  n/a Blood Thinner/ Instructions /Last Dose:n/a ASA / Instructions/ Last Dose : will stop 81 mg aspirin 5 days before per dr Jeffie Pollock instructions  Pt will be in manual wheelchair can transfer self to stretcher

## 2020-07-01 NOTE — Telephone Encounter (Signed)
CALLED PATIENT TO REMIND OF LAB AND COVID TESTING FOR 07-05-20, SPOKE WITH PATIENT AND HE IS AWARE OF THESE APPTS.

## 2020-07-05 ENCOUNTER — Encounter (HOSPITAL_COMMUNITY)
Admission: RE | Admit: 2020-07-05 | Discharge: 2020-07-05 | Disposition: A | Discharge status: Home / Self Care | Payer: Medicare Other | Source: Ambulatory Visit | Attending: Urology | Admitting: Urology

## 2020-07-05 ENCOUNTER — Other Ambulatory Visit: Payer: Self-pay

## 2020-07-05 ENCOUNTER — Other Ambulatory Visit (HOSPITAL_COMMUNITY)
Admission: RE | Admit: 2020-07-05 | Discharge: 2020-07-05 | Disposition: A | Discharge status: Home / Self Care | Payer: Medicare Other | Source: Ambulatory Visit | Attending: Urology | Admitting: Urology

## 2020-07-05 ENCOUNTER — Other Ambulatory Visit: Payer: Self-pay | Admitting: Internal Medicine

## 2020-07-05 DIAGNOSIS — Z20822 Contact with and (suspected) exposure to covid-19: Secondary | ICD-10-CM | POA: Diagnosis not present

## 2020-07-05 DIAGNOSIS — Z01812 Encounter for preprocedural laboratory examination: Secondary | ICD-10-CM | POA: Insufficient documentation

## 2020-07-05 LAB — CBC
HCT: 43.1 % (ref 39.0–52.0)
Hemoglobin: 14.3 g/dL (ref 13.0–17.0)
MCH: 31.8 pg (ref 26.0–34.0)
MCHC: 33.2 g/dL (ref 30.0–36.0)
MCV: 95.8 fL (ref 80.0–100.0)
Platelets: 293 10*3/uL (ref 150–400)
RBC: 4.5 MIL/uL (ref 4.22–5.81)
RDW: 12.8 % (ref 11.5–15.5)
WBC: 9.6 10*3/uL (ref 4.0–10.5)
nRBC: 0 % (ref 0.0–0.2)

## 2020-07-05 LAB — PROTIME-INR
INR: 1 (ref 0.8–1.2)
Prothrombin Time: 12.7 seconds (ref 11.4–15.2)

## 2020-07-05 LAB — COMPREHENSIVE METABOLIC PANEL
ALT: 29 U/L (ref 0–44)
AST: 25 U/L (ref 15–41)
Albumin: 3.9 g/dL (ref 3.5–5.0)
Alkaline Phosphatase: 69 U/L (ref 38–126)
Anion gap: 11 (ref 5–15)
BUN: 22 mg/dL (ref 8–23)
CO2: 23 mmol/L (ref 22–32)
Calcium: 9.2 mg/dL (ref 8.9–10.3)
Chloride: 104 mmol/L (ref 98–111)
Creatinine, Ser: 1.63 mg/dL — ABNORMAL HIGH (ref 0.61–1.24)
GFR, Estimated: 45 mL/min — ABNORMAL LOW (ref >60)
Glucose, Bld: 96 mg/dL (ref 70–99)
Potassium: 5.1 mmol/L (ref 3.5–5.1)
Sodium: 138 mmol/L (ref 135–145)
Total Bilirubin: 0.9 mg/dL (ref 0.3–1.2)
Total Protein: 7.6 g/dL (ref 6.5–8.1)

## 2020-07-05 LAB — SARS CORONAVIRUS 2 (TAT 6-24 HRS): SARS Coronavirus 2: NEGATIVE

## 2020-07-05 LAB — APTT: aPTT: 37 seconds — ABNORMAL HIGH (ref 24–36)

## 2020-07-08 ENCOUNTER — Telehealth: Payer: Self-pay | Admitting: *Deleted

## 2020-07-08 NOTE — Telephone Encounter (Signed)
CALLED PATIENT TO REMIND OF IMPLANT FOR 07-09-20, SPOKE WITH PATIENT AND HE IS AWARE OF THIS PROCEDURE

## 2020-07-08 NOTE — H&P (Signed)
CC/HPI: Pt presents today for pre-operative history and physical exam in anticipation of radioactive seed implant with space oar on 07/09/20 by Dr. Jeffie Pollock.   Pt denies F/C, HA, CP, SOB, N/V, diarrhea/constipation, back pain, flank pain, hematuria, and dysuria.    HX:     CC/HPI: I have prostate cancer.     Joe [name] returns today in f/u to discuss his recent prostate biopsy for a rising PSA of 5.07. He has a testosterone of 157 in 3/20. His prostate volume is 48ml. He was found to have 2 cores with cancer. He has Gleason 6 in 40% of the left mid lateral core and Gleason 7(3+4) in 10% the right lateral apex.   Stage T1c Nx Mx GG2 disease.   [facility] nomogram 67% OCD, 31% ECE, 2% LNI and 2% SVI.   IPSS 6   SHIM 2.    ALLERGIES: amlodipine Atorvastatin codeine - Nausea, Vomiting, Itching Esomeprazole Magnesium CPDR ezetimibe-simvastatin famotidine fexofenadine hydrocodone - Vomiting, Itching, Dizziness, Nausea lubiprostone nabumetone Nizatidine Oxycodone Hcl - Itching, Vomiting, Nausea, Dizziness primidone Ticlopidine Hcl Tramadol - Vomiting, Dizziness, Itching, Nausea    MEDICATIONS: Aspirin  Acetaminophen  Cholecalciferol  Fluticasone Propionate  Ipratropium-Albuterol  Linzess  Loratadine  Olmesartan Medoxomil  Pyridoxine Hcl  Torsemide  Vitamin B12  Vitamin C  Vitamin D2     GU PSH: Prostate Needle Biopsy - 01/08/2020       PSH Notes: cardiac angioplasty and stenting prior to CABG at [facility] Dr. Thresa Ross last eval week of 06/22/20.   NON-GU PSH: Coronary Artery Bypass Grafting Eye Surgery (Unspecified) Low Back Disk Surgery Surgical Pathology, Gross And Microscopic Examination For Prostate Needle - 01/08/2020     GU PMH: Chronic kidney disease stage 3 (GFR 30-60), He has CKD 3 that has progressed from a GFR of 66 in 3/20 to a GFR of 44 today. I will avoid IV contrast for staging if possible. - 02/07/2020 Prostate Cancer, He has a 70ml prostate  with a favorable intermediate risk T1c Nx Mx prostate cancer. I discussed options including surveillance, surgery and radiation therapy options. I reviewed the risks in detail. I didn't discuss cryo or HIFU at this time. He has multiple comorbidities and I would suspect a limited 10 year life expectancy. I feel active surveillance would be the most appropriate option based on the biopsy alone but his PSA has doubled over the past year and he has a history of low T which can be associated with more aggressive disease. I am going to try to get him staged and will send him to see Dr. Tammi Klippel for consideration of a seed implant should he desire active therapy. - 02/07/2020 Elevated PSA - 01/08/2020, He has a rising PSA and needs a biopsy. I reviewed the risks of bleeding, infection and voiding difficulty and sent levaquin for the prep. , - 12/04/2019 BPH w/LUTS, He has a benign exam but does have LUTS with OAB and is on Myrbetriq which helps. Further evaluation can be considered after the biospy. - 12/04/2019 Urinary Frequency - 12/04/2019 Urinary Urgency - 12/04/2019      PMH Notes: neuropathy  bilateral LE edema (mostly feet) (cards aware)   NON-GU PMH: Low testosterone, His testosterone was 157 in 3/20. I will get a repeat in the near future. If it has returned to normal I will be more comfortable with the PSA rise and will feel better with surveillance. If it remains down, particularly if lower I will be concerned. - 02/07/2020 Anxiety  Arthritis Cardiac arrest, cause unspecified GERD Heart disease, unspecified Hypercholesterolemia Hypertension Other heart failure    FAMILY HISTORY: 1 Daughter - Other 2 sons - Other   SOCIAL HISTORY: Marital Status: Single Preferred Language: English; Ethnicity: Not Hispanic Or Latino; Race: White Current Smoking Status: Patient smokes. Has smoked since 11/23/1971. Smokes 1 pack per day.   Tobacco Use Assessment Completed: Used Tobacco in last 30 days? Does not  use smokeless tobacco. Has never drank.  Does not use drugs. Drinks 4+ caffeinated drinks per day. Has not had a blood transfusion. Patient's occupation is/was retired.     Notes: Engaged to [facility]   REVIEW OF SYSTEMS:    GU Review Male:   Patient denies frequent urination, hard to postpone urination, burning/ pain with urination, get up at night to urinate, leakage of urine, stream starts and stops, trouble starting your stream, have to strain to urinate , erection problems, and penile pain.  Gastrointestinal (Upper):   Patient denies nausea, vomiting, and indigestion/ heartburn.  Gastrointestinal (Lower):   Patient denies diarrhea and constipation.  Constitutional:   Patient denies fatigue, weight loss, fever, and night sweats.  Skin:   Patient denies skin rash/ lesion and itching.  Eyes:   Patient denies blurred vision and double vision.  Ears/ Nose/ Throat:   Patient denies sore throat and sinus problems.  Hematologic/Lymphatic:   Patient denies swollen glands and easy bruising.  Cardiovascular:   Patient reports leg swelling. Patient denies chest pains.  Respiratory:   Patient denies cough and shortness of breath.  Endocrine:   Patient denies excessive thirst.  Musculoskeletal:   Patient reports joint pain. Patient denies back pain.  Neurological:   Patient reports headaches and dizziness.   Psychologic:   Patient denies depression and anxiety.   Notes: Left Knee is coming out of place, Neuropathy is bad today    VITAL SIGNS:      06/25/2020 02:15 PM  Weight 275 lb / 124.74 kg  Height 69 in / 175.26 cm  BP 154/87 mmHg  Heart Rate 91 /min  Temperature 97.3 F / 36.2 C  BMI 40.6 kg/m   MULTI-SYSTEM PHYSICAL EXAMINATION:    Constitutional: Well-nourished. No physical deformities. Normally developed. Good grooming. Obese  Neck: Neck symmetrical, not swollen. Normal tracheal position.  Respiratory: No labored breathing, no use of accessory muscles. Crackles bilateral  bases  Cardiovascular: Regular rate and rhythm. II/VI murmur, no gallop.   Lymphatic: No enlargement of neck, axillae, groin.  Skin: No paleness, no jaundice, no cyanosis. No lesion, no ulcer, no rash.  Neurologic / Psychiatric: Oriented to time, oriented to place, oriented to person. No depression, no anxiety, no agitation.  Gastrointestinal: No mass, no tenderness, no rigidity, obese abdomen.   Eyes: Normal conjunctivae. Normal eyelids.  Ears, Nose, Mouth, and Throat: Left ear no scars, no lesions, no masses. Right ear no scars, no lesions, no masses. Nose no scars, no lesions, no masses. Normal hearing. Normal lips.  Musculoskeletal: Normal gait and station of head and neck.     Complexity of Data:  Records Review:   Previous Patient Records  Urine Test Review:   Urinalysis   PROCEDURES: None   ASSESSMENT:      ICD-10 Details  1 GU:   Prostate Cancer - C61    PLAN:            Medications Stop Meds: Myrbetriq  Discontinue: 06/25/2020  - Reason: The medication cycle was completed.  Alprazolam  Discontinue: 06/25/2020  -  Reason: The medication cycle was completed.  Amitriptyline Hcl  Discontinue: 06/25/2020  - Reason: The medication cycle was completed.  Levofloxacin 750 mg tablet 1 po 1 hour prior to the procedure  Start: 12/04/2019  Discontinue: 06/25/2020  - Reason: The medication cycle was completed.  Moxifloxacin  Discontinue: 06/25/2020  - Reason: The medication cycle was completed.  Prednisolone  Discontinue: 06/25/2020  - Reason: The medication cycle was completed.            Schedule Return Visit/Planned Activity: Keep Scheduled Appointment - Schedule Surgery          Document Letter(s):  Created for Patient: Clinical Summary         Notes:   There are no changes in the patients history or physical exam since last evaluation by Dr. Jeffie Pollock. Pt is scheduled to undergo seed implant and space oar on 07/09/20.   Pt was unable to provide a urine specimen.   All  pt's questions were answered to the best of my ability.          Next Appointment:      Next Appointment: 07/09/2020 12:00 PM    Appointment Type: Surgery     Location: [facility] [phone]    Provider: Irine Seal, M.D.    Reason for Visit: [place]

## 2020-07-09 ENCOUNTER — Ambulatory Visit (HOSPITAL_BASED_OUTPATIENT_CLINIC_OR_DEPARTMENT_OTHER)
Admission: RE | Admit: 2020-07-09 | Discharge: 2020-07-09 | Disposition: A | Discharge status: Home / Self Care | Payer: Medicare Other | Attending: Urology | Admitting: Urology

## 2020-07-09 ENCOUNTER — Ambulatory Visit (HOSPITAL_BASED_OUTPATIENT_CLINIC_OR_DEPARTMENT_OTHER): Payer: Medicare Other | Admitting: Anesthesiology

## 2020-07-09 ENCOUNTER — Other Ambulatory Visit: Payer: Self-pay

## 2020-07-09 ENCOUNTER — Encounter (HOSPITAL_BASED_OUTPATIENT_CLINIC_OR_DEPARTMENT_OTHER)
Admission: RE | Disposition: A | Discharge status: Home / Self Care | Payer: Self-pay | Source: Home / Self Care | Attending: Urology

## 2020-07-09 ENCOUNTER — Ambulatory Visit (HOSPITAL_COMMUNITY): Payer: Medicare Other

## 2020-07-09 ENCOUNTER — Encounter (HOSPITAL_BASED_OUTPATIENT_CLINIC_OR_DEPARTMENT_OTHER): Payer: Self-pay | Admitting: Urology

## 2020-07-09 DIAGNOSIS — Z885 Allergy status to narcotic agent status: Secondary | ICD-10-CM | POA: Insufficient documentation

## 2020-07-09 DIAGNOSIS — I2510 Atherosclerotic heart disease of native coronary artery without angina pectoris: Secondary | ICD-10-CM | POA: Diagnosis not present

## 2020-07-09 DIAGNOSIS — Z951 Presence of aortocoronary bypass graft: Secondary | ICD-10-CM | POA: Diagnosis not present

## 2020-07-09 DIAGNOSIS — C61 Malignant neoplasm of prostate: Secondary | ICD-10-CM | POA: Diagnosis present

## 2020-07-09 DIAGNOSIS — Z888 Allergy status to other drugs, medicaments and biological substances status: Secondary | ICD-10-CM | POA: Insufficient documentation

## 2020-07-09 DIAGNOSIS — F17210 Nicotine dependence, cigarettes, uncomplicated: Secondary | ICD-10-CM | POA: Diagnosis not present

## 2020-07-09 DIAGNOSIS — J449 Chronic obstructive pulmonary disease, unspecified: Secondary | ICD-10-CM | POA: Diagnosis not present

## 2020-07-09 DIAGNOSIS — Z955 Presence of coronary angioplasty implant and graft: Secondary | ICD-10-CM | POA: Diagnosis not present

## 2020-07-09 DIAGNOSIS — I10 Essential (primary) hypertension: Secondary | ICD-10-CM | POA: Diagnosis not present

## 2020-07-09 HISTORY — PX: CYSTOSCOPY: SHX5120

## 2020-07-09 HISTORY — DX: Polyneuropathy, unspecified: G62.9

## 2020-07-09 HISTORY — PX: SPACE OAR INSTILLATION: SHX6769

## 2020-07-09 HISTORY — DX: Other complications of anesthesia, initial encounter: T88.59XA

## 2020-07-09 HISTORY — DX: Cardiac murmur, unspecified: R01.1

## 2020-07-09 HISTORY — PX: RADIOACTIVE SEED IMPLANT: SHX5150

## 2020-07-09 SURGERY — INSERTION, RADIATION SOURCE, PROSTATE
Anesthesia: General | Site: Urethra

## 2020-07-09 MED ORDER — ONDANSETRON HCL 4 MG/2ML IJ SOLN
INTRAMUSCULAR | Status: AC
  Filled 2020-07-09: qty 2

## 2020-07-09 MED ORDER — SODIUM CHLORIDE (PF) 0.9 % IJ SOLN
INTRAMUSCULAR | Status: DC | PRN
  Administered 2020-07-09: 10 mL

## 2020-07-09 MED ORDER — SUGAMMADEX SODIUM 500 MG/5ML IV SOLN
INTRAVENOUS | Status: DC | PRN
  Administered 2020-07-09: 250 mg via INTRAVENOUS

## 2020-07-09 MED ORDER — GLYCOPYRROLATE PF 0.2 MG/ML IJ SOSY
PREFILLED_SYRINGE | INTRAMUSCULAR | Status: AC
  Filled 2020-07-09: qty 1

## 2020-07-09 MED ORDER — ACETAMINOPHEN 10 MG/ML IV SOLN: 1000.0000 mg | Freq: Once | INTRAVENOUS | Status: DC | PRN

## 2020-07-09 MED ORDER — FENTANYL CITRATE (PF) 100 MCG/2ML IJ SOLN
INTRAMUSCULAR | Status: AC
  Filled 2020-07-09: qty 2

## 2020-07-09 MED ORDER — SODIUM CHLORIDE 0.9% FLUSH: 3.0000 mL | INTRAVENOUS | Status: DC | PRN

## 2020-07-09 MED ORDER — FLEET ENEMA 7-19 GM/118ML RE ENEM: 1.0000 | ENEMA | Freq: Once | RECTAL | Status: DC

## 2020-07-09 MED ORDER — SODIUM CHLORIDE 0.9 % IV SOLN
INTRAVENOUS | Status: AC | PRN
  Administered 2020-07-09: 1000 mL via INTRAMUSCULAR

## 2020-07-09 MED ORDER — DEXAMETHASONE SODIUM PHOSPHATE 10 MG/ML IJ SOLN
INTRAMUSCULAR | Status: DC | PRN
  Administered 2020-07-09: 10 mg via INTRAVENOUS

## 2020-07-09 MED ORDER — SODIUM CHLORIDE 0.9% FLUSH: 3.0000 mL | Freq: Two times a day (BID) | INTRAVENOUS | Status: DC

## 2020-07-09 MED ORDER — ONDANSETRON HCL 4 MG/2ML IJ SOLN
INTRAMUSCULAR | Status: DC | PRN
  Administered 2020-07-09: 4 mg via INTRAVENOUS

## 2020-07-09 MED ORDER — PHENYLEPHRINE 40 MCG/ML (10ML) SYRINGE FOR IV PUSH (FOR BLOOD PRESSURE SUPPORT)
PREFILLED_SYRINGE | INTRAVENOUS | Status: AC
  Filled 2020-07-09: qty 10

## 2020-07-09 MED ORDER — HYDROMORPHONE HCL 2 MG PO TABS
2.0000 mg | ORAL_TABLET | Freq: Four times a day (QID) | ORAL | 0 refills | Status: AC | PRN
Start: 2020-07-09 — End: 2020-07-14

## 2020-07-09 MED ORDER — IOHEXOL 300 MG/ML  SOLN
INTRAMUSCULAR | Status: DC | PRN
  Administered 2020-07-09: 7 mL

## 2020-07-09 MED ORDER — FENTANYL CITRATE (PF) 100 MCG/2ML IJ SOLN: 25.0000 ug | INTRAMUSCULAR | Status: DC | PRN

## 2020-07-09 MED ORDER — SODIUM CHLORIDE 0.9 % IV SOLN: 250.0000 mL | INTRAVENOUS | Status: DC | PRN

## 2020-07-09 MED ORDER — ACETAMINOPHEN 325 MG RE SUPP: 650.0000 mg | RECTAL | Status: DC | PRN

## 2020-07-09 MED ORDER — ROCURONIUM BROMIDE 10 MG/ML (PF) SYRINGE
PREFILLED_SYRINGE | INTRAVENOUS | Status: DC | PRN
  Administered 2020-07-09: 10 mg via INTRAVENOUS
  Administered 2020-07-09: 60 mg via INTRAVENOUS

## 2020-07-09 MED ORDER — ACETAMINOPHEN 325 MG PO TABS: 650.0000 mg | ORAL_TABLET | ORAL | Status: DC | PRN

## 2020-07-09 MED ORDER — EPHEDRINE 5 MG/ML INJ
INTRAVENOUS | Status: AC
  Filled 2020-07-09: qty 10

## 2020-07-09 MED ORDER — ONDANSETRON HCL 4 MG/2ML IJ SOLN
4.0000 mg | Freq: Once | INTRAMUSCULAR | Status: AC | PRN
  Administered 2020-07-09: 4 mg via INTRAVENOUS

## 2020-07-09 MED ORDER — FENTANYL CITRATE (PF) 100 MCG/2ML IJ SOLN
INTRAMUSCULAR | Status: DC | PRN
  Administered 2020-07-09 (×2): 50 ug via INTRAVENOUS

## 2020-07-09 MED ORDER — PROPOFOL 10 MG/ML IV BOLUS
INTRAVENOUS | Status: AC
  Filled 2020-07-09: qty 20

## 2020-07-09 MED ORDER — PHENYLEPHRINE 40 MCG/ML (10ML) SYRINGE FOR IV PUSH (FOR BLOOD PRESSURE SUPPORT)
PREFILLED_SYRINGE | INTRAVENOUS | Status: DC | PRN
  Administered 2020-07-09 (×3): 80 ug via INTRAVENOUS

## 2020-07-09 MED ORDER — CIPROFLOXACIN IN D5W 400 MG/200ML IV SOLN
400.0000 mg | INTRAVENOUS | Status: AC
  Administered 2020-07-09: 400 mg via INTRAVENOUS

## 2020-07-09 MED ORDER — CIPROFLOXACIN IN D5W 400 MG/200ML IV SOLN
INTRAVENOUS | Status: AC
  Filled 2020-07-09: qty 200

## 2020-07-09 MED ORDER — ROCURONIUM BROMIDE 10 MG/ML (PF) SYRINGE
PREFILLED_SYRINGE | INTRAVENOUS | Status: AC
  Filled 2020-07-09: qty 10

## 2020-07-09 MED ORDER — PROPOFOL 10 MG/ML IV BOLUS
INTRAVENOUS | Status: DC | PRN
  Administered 2020-07-09: 180 mg via INTRAVENOUS

## 2020-07-09 MED ORDER — LIDOCAINE 2% (20 MG/ML) 5 ML SYRINGE
INTRAMUSCULAR | Status: AC
  Filled 2020-07-09: qty 5

## 2020-07-09 MED ORDER — DEXAMETHASONE SODIUM PHOSPHATE 10 MG/ML IJ SOLN
INTRAMUSCULAR | Status: AC
  Filled 2020-07-09: qty 1

## 2020-07-09 MED ORDER — SUGAMMADEX SODIUM 500 MG/5ML IV SOLN
INTRAVENOUS | Status: AC
  Filled 2020-07-09: qty 5

## 2020-07-09 MED ORDER — LIDOCAINE 2% (20 MG/ML) 5 ML SYRINGE
INTRAMUSCULAR | Status: DC | PRN
  Administered 2020-07-09: 80 mg via INTRAVENOUS

## 2020-07-09 MED ORDER — GLYCOPYRROLATE 0.2 MG/ML IJ SOLN
INTRAMUSCULAR | Status: DC | PRN
  Administered 2020-07-09: .2 mg via INTRAVENOUS

## 2020-07-09 MED ORDER — MIDAZOLAM HCL 2 MG/2ML IJ SOLN
INTRAMUSCULAR | Status: AC
  Filled 2020-07-09: qty 2

## 2020-07-09 MED ORDER — LACTATED RINGERS IV SOLN: INTRAVENOUS | Status: DC

## 2020-07-09 SURGICAL SUPPLY — 36 items
BAG DRN RND TRDRP ANRFLXCHMBR (UROLOGICAL SUPPLIES) ×3
BAG URINE DRAIN 2000ML AR STRL (UROLOGICAL SUPPLIES) ×5 IMPLANT
BLADE CLIPPER SENSICLIP SURGIC (BLADE) ×5 IMPLANT
CATH FOLEY 2WAY SLVR  5CC 16FR (CATHETERS) ×5
CATH FOLEY 2WAY SLVR 5CC 16FR (CATHETERS) ×3 IMPLANT
CATH ROBINSON RED A/P 16FR (CATHETERS) IMPLANT
CATH ROBINSON RED A/P 20FR (CATHETERS) ×5 IMPLANT
CLOTH BEACON ORANGE TIMEOUT ST (SAFETY) ×5 IMPLANT
CNTNR URN SCR LID CUP LEK RST (MISCELLANEOUS) ×6 IMPLANT
CONT SPEC 4OZ STRL OR WHT (MISCELLANEOUS) ×10
COVER BACK TABLE 60X90IN (DRAPES) ×5 IMPLANT
COVER MAYO STAND STRL (DRAPES) ×5 IMPLANT
DRAPE C-ARM 35X43 STRL (DRAPES) ×5 IMPLANT
DRSG TEGADERM 4X4.75 (GAUZE/BANDAGES/DRESSINGS) ×8 IMPLANT
DRSG TEGADERM 8X12 (GAUZE/BANDAGES/DRESSINGS) ×10 IMPLANT
GAUZE SPONGE 4X4 12PLY STRL (GAUZE/BANDAGES/DRESSINGS) ×2 IMPLANT
GLOVE BIO SURGEON STRL SZ8 (GLOVE) ×2 IMPLANT
GLOVE SURG ORTHO 8.5 STRL (GLOVE) ×9 IMPLANT
GLOVE SURG SS PI 8.0 STRL IVOR (GLOVE) ×10 IMPLANT
GOWN STRL REUS W/ TWL XL LVL3 (GOWN DISPOSABLE) ×3 IMPLANT
GOWN STRL REUS W/TWL XL LVL3 (GOWN DISPOSABLE) ×10 IMPLANT
HOLDER FOLEY CATH W/STRAP (MISCELLANEOUS) ×3 IMPLANT
I Seed AgX100 ×142 IMPLANT
IMPL SPACEOAR VUE SYSTEM (Spacer) IMPLANT
IMPLANT SPACEOAR VUE SYSTEM (Spacer) ×5 IMPLANT
IV NS 1000ML (IV SOLUTION) ×5
IV NS 1000ML BAXH (IV SOLUTION) ×3 IMPLANT
KIT TURNOVER CYSTO (KITS) ×5 IMPLANT
MARKER SKIN DUAL TIP RULER LAB (MISCELLANEOUS) ×5 IMPLANT
PACK CYSTO (CUSTOM PROCEDURE TRAY) ×5 IMPLANT
SURGILUBE 2OZ TUBE FLIPTOP (MISCELLANEOUS) ×2 IMPLANT
SYR 10ML LL (SYRINGE) ×2 IMPLANT
TOWEL OR 17X26 10 PK STRL BLUE (TOWEL DISPOSABLE) ×5 IMPLANT
UNDERPAD 30X36 HEAVY ABSORB (UNDERPADS AND DIAPERS) ×10 IMPLANT
WATER STERILE IRR 3000ML UROMA (IV SOLUTION) ×5 IMPLANT
WATER STERILE IRR 500ML POUR (IV SOLUTION) ×5 IMPLANT

## 2020-07-09 NOTE — Transfer of Care (Signed)
Immediate Anesthesia Transfer of Care Note  Patient: Joe [name]  Procedure(s) Performed: RADIOACTIVE SEED IMPLANT/BRACHYTHERAPY IMPLANT (N/A ) SPACE OAR INSTILLATION (N/A ) CYSTOSCOPY FLEXIBLE (Bilateral Urethra)  Patient Location: PACU  Anesthesia Type:General  Level of Consciousness: awake, alert  and oriented  Airway & Oxygen Therapy: Patient Spontanous Breathing and Patient connected to nasal cannula oxygen  Post-op Assessment: Report given to RN and Post -op Vital signs reviewed and stable  Post vital signs: Reviewed and stable  Last Vitals:  Vitals Value Taken Time  BP 126/61 07/09/20 1342  Temp    Pulse 76 07/09/20 1344  Resp 23 07/09/20 1344  SpO2 97 % 07/09/20 1344  Vitals shown include unvalidated device data.  Last Pain:  Vitals:   07/09/20 1042  TempSrc: Oral  PainSc: 0-No pain      Patients Stated Pain Goal: 4 (77/41/28 7867)  Complications: No complications documented.

## 2020-07-09 NOTE — Op Note (Signed)
PATIENT:  Joe [name]  PRE-OPERATIVE DIAGNOSIS:  Adenocarcinoma of the prostate  POST-OPERATIVE DIAGNOSIS:  Same  PROCEDURE:  Procedure(s): 1. I-125 radioactive seed implantation 2. SpaceOAR implantation. 3.  Cystoscopy  SURGEON:  Surgeon(s): Irine Seal MD  Radiation oncologist: Dr. Tyler Pita  ANESTHESIA:  General  EBL:  Minimal  DRAINS: 56 French Foley catheter  INDICATION: Joe [name] is a 74 y.o. with Stage T1c, Gleason 7(3+4) prostate cancer who has elected brachytherapy for treatment.  Description of procedure: After informed consent the patient was brought to the major OR, placed on the table and administered general anesthesia. He was then moved to the modified lithotomy position with his perineum perpendicular to the floor. His perineum and genitalia were then sterilely prepped. An official timeout was then performed. A 16 French Foley catheter was then placed in the bladder and filled with dilute contrast, a rectal tube was placed in the rectum and the transrectal ultrasound probe was placed in the rectum and affixed to the stand. He was then sterilely draped.  The sterile grid was installed.   Anchor needles were then placed.   Real time ultrasonography was used along with the seed planning software spot-pro version 3.1-00. This was used to develop the seed plan including the number of needles as well as number of seeds required for complete and adequate coverage. Real-time ultrasonography was then used along with the previously developed plan  to implant a total of 71 seeds using 19 needles for a target dose of 145 Gy. This proceeded without difficulty or complication.  The anchor needles and guide were removed and the SpaceOAR needle was passed under US guidance into the fat stripe posterior to the prostate with the tip in the midline at mid prostate. A puff of NS confirmed appropriate positioning and the SpaceOAR polymer was then injected over 10 seconds into  the space with excellent distribution.     A Foley catheter was then removed as well as the transrectal ultrasound probe and rectal probe. Flexible cystoscopy was then performed using the 17 French flexible scope which revealed a normal urethra throughout its length down to the sphincter which appeared intact. The prostatic urethra was 2-3cm with bilobar hyperplasia with some coaptation. The bladder was then entered and fully and systematically inspected.  Mild trabeculation noted.  The ureteral orifices were noted to be of normal configuration and position. The mucosa revealed no evidence of tumors. There were also no stones identified within the bladder.  No seeds or spacers were seen and/or removed from the bladder.  The cystoscope was then removed.  The drapes were removed.  The perineum was cleaned and dressed.  He was taken out of the lithotomy position and was awakened and taken to recovery room in stable and satisfactory condition. He tolerated procedure well and there were no intraoperative complications.

## 2020-07-09 NOTE — Anesthesia Procedure Notes (Signed)
Procedure Name: Intubation Date/Time: 07/09/2020 12:28 PM Performed by: Kline Bulthuis D, CRNA Pre-anesthesia Checklist: Patient identified, Emergency Drugs available, Suction available and Patient being monitored Patient Re-evaluated:Patient Re-evaluated prior to induction Oxygen Delivery Method: Circle system utilized Preoxygenation: Pre-oxygenation with 100% oxygen Induction Type: IV induction Ventilation: Mask ventilation without difficulty Laryngoscope Size: Mac and 4 Grade View: Grade I Tube type: Oral Tube size: 7.5 mm Number of attempts: 1 Airway Equipment and Method: Stylet Placement Confirmation: ETT inserted through vocal cords under direct vision,  positive ETCO2 and breath sounds checked- equal and bilateral Secured at: 22 cm Tube secured with: Tape Dental Injury: Teeth and Oropharynx as per pre-operative assessment

## 2020-07-09 NOTE — Discharge Instructions (Signed)
Brachytherapy for Prostate Cancer, Care After  This sheet gives you information about how to care for yourself after your procedure. Your health care provider may also give you more specific instructions. If you have problems or questions, contact your health care provider. What can I expect after the procedure? After the procedure, it is common to have:  Trouble passing urine.  Blood in the urine or semen.  Constipation.  Frequent feeling of an urgent need to urinate.  Bruising, swelling, and tenderness of the area behind the scrotum (perineum).  Bloating and gas.  Fatigue.  Burning or pain in the rectum.  Problems getting or keeping an erection (erectile dysfunction).  Nausea. Follow these instructions at home: Managing pain, stiffness, and swelling  If directed, apply ice to the affected area: ? Put ice in a plastic bag. ? Place a towel between your skin and the bag. ? Leave the ice on for 20 minutes, 2-3 times a day.  Try not to sit directly on the area behind the scrotum. A soft cushion can help with discomfort. Activity  Do not drive for 24 hours if you were given a medicine to help you relax (sedative).  Do not drive or use heavy machinery while taking prescription pain medicine.  Rest as told by your health care provider.  Most people can return to normal activities a few days or weeks after the procedure. Ask your health care provider what activities are safe for you. Eating and drinking  Drink enough fluid to keep your urine clear or pale yellow.  Eat a healthy, balanced diet. This includes lean proteins, whole grains, and plenty of fruits and vegetables. General instructions  Take over-the-counter and prescription medicines only as told by your health care provider.  Keep all follow-up visits as told by your health care provider. This is important. You may still need additional treatment.  Do not take baths, swim, or use a hot tub until your health  care provider approves. Shower and wash the area behind the scrotum gently.  Do not have sex for one week after the treatment, or until your health care provider approves.  If you have permanent, low-dose brachytherapy implants: ? Limit close contact with children and pregnant women for 2 months or as told by your health care provider. This is important because of the radiation that is still active in the prostate. ? You may set off radioactive sensors, such as airport screenings. Ask your health care provider for a document that explains your treatment. ? You may be instructed to use a condom during sex for the first 2 months after low-dose brachytherapy. Contact a health care provider if:  You have a fever or chills.  You do not have a bowel movement for 3-4 days after the procedure.  You have diarrhea for 3-4 days after the procedure.  You develop any new symptoms, such as problems with urinating or erectile dysfunction.  You have abdomen (abdominal) pain.  You have more blood in your urine. Get help right away if:  You cannot urinate.  There is excessive bleeding from your rectum.  You have unusual drainage coming from your rectum.  You have severe pain in the treated area that does not go away with pain medicine.  You have severe nausea or vomiting. Summary  If you have permanent, low-dose brachytherapy implants, limit close contact with children and pregnant women for 2 months or as told by your health care provider. This is important because of the radiation  that is still active in the prostate.  Talk with your health care provider about your risk of brachytherapy side effects, such as erectile dysfunction or urinary problems. Your health care provider will be able to recommend possible treatment options.  Keep all follow-up visits as told by your health care provider. This is important. You may need additional treatment. This information is not intended to replace  advice given to you by your health care provider. Make sure you discuss any questions you have with your health care provider. Document Revised: 07/23/2017 Document Reviewed: 09/11/2016 Elsevier Patient Education  [place] Instructions  Activity: Get plenty of rest for the remainder of the day. A responsible individual must stay with you for 24 hours following the procedure.  For the next 24 hours, DO NOT: -Drive a car -Paediatric nurse -Drink alcoholic beverages -Take any medication unless instructed by your physician -Make any legal decisions or sign important papers.  Meals: Start with liquid foods such as gelatin or soup. Progress to regular foods as tolerated. Avoid greasy, spicy, heavy foods. If nausea and/or vomiting occur, drink only clear liquids until the nausea and/or vomiting subsides. Call your physician if vomiting continues.  Special Instructions/Symptoms: Your throat may feel dry or sore from the anesthesia or the breathing tube placed in your throat during surgery. If this causes discomfort, gargle with warm salt water. The discomfort should disappear within 24 hours.  If you had a scopolamine patch placed behind your ear for the management of post- operative nausea and/or vomiting:  1. The medication in the patch is effective for 72 hours, after which it should be removed.  Wrap patch in a tissue and discard in the trash. Wash hands thoroughly with soap and water. 2. You may remove the patch earlier than 72 hours if you experience unpleasant side effects which may include dry mouth, dizziness or visual disturbances. 3. Avoid touching the patch. Wash your hands with soap and water after contact with the patch.     Indwelling Urinary Catheter Care, Adult An indwelling urinary catheter is a thin tube that is put into your bladder. The tube helps to drain pee (urine) out of your body. The tube goes in through your urethra. Your  urethra is where pee comes out of your body. Your pee will come out through the catheter, then it will go into a bag (drainage bag). Take good care of your catheter so it will work well. How to wear your catheter and bag Supplies needed  Sticky tape (adhesive tape) or a leg strap.  Alcohol wipe or soap and water (if you use tape).  A clean towel (if you use tape).  Large overnight bag.  Smaller bag (leg bag). Wearing your catheter Attach your catheter to your leg with tape or a leg strap.  Make sure the catheter is not pulled tight.  If a leg strap gets wet, take it off and put on a dry strap.  If you use tape to hold the bag on your leg: 1. Use an alcohol wipe or soap and water to wash your skin where the tape made it sticky before. 2. Use a clean towel to pat-dry that skin. 3. Use new tape to make the bag stay on your leg. Wearing your bags You should have been given a large overnight bag.  You may wear the overnight bag in the day or night.  Always have the overnight bag lower than  your bladder.  Do not let the bag touch the floor.  Before you go to sleep, put a clean plastic bag in a wastebasket. Then hang the overnight bag inside the wastebasket. You should also have a smaller leg bag that fits under your clothes.  Always wear the leg bag below your knee.  Do not wear your leg bag at night. How to care for your skin and catheter Supplies needed  A clean washcloth.  Water and mild soap.  A clean towel. Caring for your skin and catheter      Clean the skin around your catheter every day: 1. Wash your hands with soap and water. 2. Wet a clean washcloth in warm water and mild soap. 3. Clean the skin around your urethra.  If you are male:  Gently spread the folds of skin around your vagina (labia).  With the washcloth in your other hand, wipe the inner side of your labia on each side. Wipe from front to back.  If you are male:  Pull back any skin that  covers the end of your penis (foreskin).  With the washcloth in your other hand, wipe your penis in small circles. Start wiping at the tip of your penis, then move away from the catheter.  Move the foreskin back in place, if needed. 4. With your free hand, hold the catheter close to where it goes into your body.  Keep holding the catheter during cleaning so it does not get pulled out. 5. With the washcloth in your other hand, clean the catheter.  Only wipe downward on the catheter.  Do not wipe upward toward your body. Doing this may push germs into your urethra and cause infection. 6. Use a clean towel to pat-dry the catheter and the skin around it. Make sure to wipe off all soap. 7. Wash your hands with soap and water.  Shower every day. Do not take baths.  Do not use cream, ointment, or lotion on the area where the catheter goes into your body, unless your doctor tells you to.  Do not use powders, sprays, or lotions on your genital area.  Check your skin around the catheter every day for signs of infection. Check for: ? Redness, swelling, or pain. ? Fluid or blood. ? Warmth. ? Pus or a bad smell. How to empty the bag Supplies needed  Rubbing alcohol.  Gauze pad or cotton ball.  Tape or a leg strap. Emptying the bag Pour the pee out of your bag when it is ?- full, or at least 2-3 times a day. Do this for your overnight bag and your leg bag. 1. Wash your hands with soap and water. 2. Separate (detach) the bag from your leg. 3. Hold the bag over the toilet or a clean pail. Keep the bag lower than your hips and bladder. This is so the pee (urine) does not go back into the tube. 4. Open the pour spout. It is at the bottom of the bag. 5. Empty the pee into the toilet or pail. Do not let the pour spout touch any surface. 6. Put rubbing alcohol on a gauze pad or cotton ball. 7. Use the gauze pad or cotton ball to clean the pour spout. 8. Close the pour spout. 9. Attach the  bag to your leg with tape or a leg strap. 10. Wash your hands with soap and water. Follow instructions for cleaning the drainage bag:  From the product maker.  As told by  your doctor. How to change the bag Supplies needed  Alcohol wipes.  A clean bag.  Tape or a leg strap. Changing the bag Replace your bag when it starts to leak, smell bad, or look dirty. 1. Wash your hands with soap and water. 2. Separate the dirty bag from your leg. 3. Pinch the catheter with your fingers so that pee does not spill out. 4. Separate the catheter tube from the bag tube where these tubes connect (at the connection valve). Do not let the tubes touch any surface. 5. Clean the end of the catheter tube with an alcohol wipe. Use a different alcohol wipe to clean the end of the bag tube. 6. Connect the catheter tube to the tube of the clean bag. 7. Attach the clean bag to your leg with tape or a leg strap. Do not make the bag tight on your leg. 8. Wash your hands with soap and water. General rules   Never pull on your catheter. Never try to take it out. Doing that can hurt you.  Always wash your hands before and after you touch your catheter or bag. Use a mild, fragrance-free soap. If you do not have soap and water, use hand sanitizer.  Always make sure there are no twists or bends (kinks) in the catheter tube.  Always make sure there are no leaks in the catheter or bag.  Drink enough fluid to keep your pee pale yellow.  Do not take baths, swim, or use a hot tub.  If you are male, wipe from front to back after you poop (have a bowel movement). Contact a doctor if:  Your pee is cloudy.  Your pee smells worse than usual.  Your catheter gets clogged.  Your catheter leaks.  Your bladder feels full. Get help right away if:  You have redness, swelling, or pain where the catheter goes into your body.  You have fluid, blood, pus, or a bad smell coming from the area where the catheter goes  into your body.  Your skin feels warm where the catheter goes into your body.  You have a fever.  You have pain in your: ? Belly (abdomen). ? Legs. ? Lower back. ? Bladder.  You see blood in the catheter.  Your pee is pink or red.  You feel sick to your stomach (nauseous).  You throw up (vomit).  You have chills.  Your pee is not draining into the bag.  Your catheter gets pulled out. Summary  An indwelling urinary catheter is a thin tube that is placed into the bladder to help drain pee (urine) out of the body.  The catheter is placed into the part of the body that drains pee from the bladder (urethra).  Taking good care of your catheter will keep it working properly and help prevent problems.  Always wash your hands before and after touching your catheter or bag.  Never pull on your catheter or try to take it out. This information is not intended to replace advice given to you by your health care provider. Make sure you discuss any questions you have with your health care provider. Document Revised: 12/02/2018 Document Reviewed: 03/26/2017 Elsevier Patient Education  [place].

## 2020-07-09 NOTE — Anesthesia Preprocedure Evaluation (Signed)
Anesthesia Evaluation  Patient identified by MRN, date of birth, ID band Patient awake    Reviewed: Patient's Chart, lab work & pertinent test results  History of Anesthesia Complications (+) Emergence Delirium  Airway Mallampati: II  TM Distance: >3 FB Neck ROM: Full    Dental  (+) Upper Dentures, Missing, Dental Advisory Given   Pulmonary COPD, Current Smoker,    Pulmonary exam normal        Cardiovascular hypertension, Pt. on medications + CAD, + CABG and + Peripheral Vascular Disease   Rhythm:Regular Rate:Normal + Systolic murmurs Cardiology-Dr. Stanford Breed seen 06/19/20, last myoview 2014 EF >65%, echo pending for likely AS murmur   Neuro/Psych  Headaches, Anxiety    GI/Hepatic negative GI ROS, Neg liver ROS,   Endo/Other  negative endocrine ROS  Renal/GU negative Renal ROS   Prostate Ca    Musculoskeletal  (+) Arthritis , Osteoarthritis,    Abdominal (+)  Abdomen: soft. Bowel sounds: normal.  Peds  Hematology negative hematology ROS (+)   Anesthesia Other Findings   Reproductive/Obstetrics                             Anesthesia Physical Anesthesia Plan  ASA: III  Anesthesia Plan: General   Post-op Pain Management:    Induction: Intravenous  PONV Risk Score and Plan: 1 and Ondansetron, Dexamethasone and Treatment may vary due to age or medical condition  Airway Management Planned: Mask and LMA  Additional Equipment: None  Intra-op Plan:   Post-operative Plan: Extubation in OR  Informed Consent: I have reviewed the patients History and Physical, chart, labs and discussed the procedure including the risks, benefits and alternatives for the proposed anesthesia with the patient or authorized representative who has indicated his/her understanding and acceptance.     Dental advisory given  Plan Discussed with: CRNA  Anesthesia Plan Comments: (Lab Results       Component                Value               Date                      WBC                      9.6                 07/05/2020                HGB                      14.3                07/05/2020                HCT                      43.1                07/05/2020                MCV                      95.8                07/05/2020  PLT                      293                 07/05/2020          )        Anesthesia Quick Evaluation

## 2020-07-09 NOTE — Interval H&P Note (Signed)
History and Physical Interval Note:  07/09/2020 11:31 AM  Joe [name]  has presented today for surgery, with the diagnosis of PROSTATE CANCER.  The various methods of treatment have been discussed with the patient and family. After consideration of risks, benefits and other options for treatment, the patient has consented to  Procedure(s): RADIOACTIVE SEED IMPLANT/BRACHYTHERAPY IMPLANT (N/A) SPACE OAR INSTILLATION (N/A) as a surgical intervention.  The patient's history has been reviewed, patient examined, no change in status, stable for surgery.  I have reviewed the patient's chart and labs.  Questions were answered to the patient's satisfaction.     Irine Seal

## 2020-07-10 ENCOUNTER — Encounter (HOSPITAL_BASED_OUTPATIENT_CLINIC_OR_DEPARTMENT_OTHER): Payer: Self-pay | Admitting: Urology

## 2020-07-10 ENCOUNTER — Ambulatory Visit: Payer: Medicare Other

## 2020-07-10 NOTE — Anesthesia Postprocedure Evaluation (Signed)
Anesthesia Post Note  Patient: Joe [name]  Procedure(s) Performed: RADIOACTIVE SEED IMPLANT/BRACHYTHERAPY IMPLANT (N/A Prostate) SPACE OAR INSTILLATION (N/A ) CYSTOSCOPY FLEXIBLE (Bilateral Urethra)     Patient location during evaluation: PACU Anesthesia Type: General Level of consciousness: awake and alert Pain management: pain level controlled Vital Signs Assessment: post-procedure vital signs reviewed and stable Respiratory status: spontaneous breathing, nonlabored ventilation, respiratory function stable and patient connected to nasal cannula oxygen Cardiovascular status: blood pressure returned to baseline and stable Postop Assessment: no apparent nausea or vomiting Anesthetic complications: no   No complications documented.  Last Vitals:  Vitals:   07/09/20 1415 07/09/20 1655  BP: 132/65 (!) 153/84  Pulse: 67 77  Resp: 20 18  Temp: 36.4 C (!) 36.3 C  SpO2: 98% 96%    Last Pain:  Vitals:   07/09/20 1655  TempSrc: Oral  PainSc: 0-No pain                 Belenda Cruise P Preethi Scantlebury

## 2020-07-13 NOTE — Progress Notes (Signed)
  Radiation Oncology         [phone] ________________________________  Name: Joe [name] MRN: [id number]  Date: 07/13/2020  DOB: 1945/10/03       Prostate Seed Implant  EO:[id number], Evie Lacks, MD  No ref. provider found  DIAGNOSIS:  Oncology History  Malignant neoplasm of prostate ([place])  01/08/2020 Cancer Staging   Staging form: Prostate, AJCC 8th Edition - Clinical stage from 01/08/2020: Stage IIB (cT1c, cN0, cM0, PSA: 5.7, Grade Group: 2) - Signed by Freeman Caldron, PA-C on 04/09/2020   02/07/2020 Initial Diagnosis   Malignant neoplasm of prostate ([place])     No diagnosis found.  PROCEDURE: Insertion of radioactive I-125 seeds into the prostate gland.  RADIATION DOSE: 145 Gy, definitive therapy.  TECHNIQUE: Joe [name] was brought to the operating room with the urologist. He was placed in the dorsolithotomy position. He was catheterized and a rectal tube was inserted. The perineum was shaved, prepped and draped. The ultrasound probe was then introduced into the rectum to see the prostate gland.  TREATMENT DEVICE: A needle grid was attached to the ultrasound probe stand and anchor needles were placed.  3D PLANNING: The prostate was imaged in 3D using a sagittal sweep of the prostate probe. These images were transferred to the planning computer. There, the prostate, urethra and rectum were defined on each axial reconstructed image. Then, the software created an optimized 3D plan and a few seed positions were adjusted. The quality of the plan was reviewed using [facility] information for the target and the following two organs at risk:  Urethra and Rectum.  Then the accepted plan was printed and handed off to the radiation therapist.  Under my supervision, the custom loading of the seeds and spacers was carried out and loaded into sealed vicryl sleeves.  These pre-loaded needles were then placed into the needle holder.[name]  PROSTATE VOLUME STUDY:  Using transrectal ultrasound the  volume of the prostate was verified to be 38.7 cc.  SPECIAL TREATMENT PROCEDURE/SUPERVISION AND HANDLING: The pre-loaded needles were then delivered under sagittal guidance. A total of 19 needles were used to deposit 71 seeds in the prostate gland. The individual seed activity was 0.425 mCi.  SpaceOAR:  Yes  COMPLEX SIMULATION: At the end of the procedure, an anterior radiograph of the pelvis was obtained to document seed positioning and count. Cystoscopy was performed to check the urethra and bladder.  MICRODOSIMETRY: At the end of the procedure, the patient was emitting 0.04 mR/hr at 1 meter. Accordingly, he was considered safe for hospital discharge.  PLAN: The patient will return to the radiation oncology clinic for post implant CT dosimetry in three weeks.   ________________________________  Sheral Apley Tammi Klippel, M.D.

## 2020-07-23 ENCOUNTER — Telehealth: Payer: Self-pay | Admitting: *Deleted

## 2020-07-23 NOTE — Progress Notes (Signed)
Radiation Oncology         [phone] ________________________________  Name: Joe [name] MRN: [id number]  Date: 07/24/2020  DOB: 21-May-1946  Post-Seed Follow-Up Visit Note  CC: Plotnikov, Evie Lacks, MD  Plotnikov, Evie Lacks, MD  Diagnosis:    74 y.o. gentleman with Stage T1c adenocarcinoma of the prostate with Gleason Score of 3+4, and PSA of 5.7.    ICD-10-CM   1. Malignant neoplasm of prostate ([place])  C61     Interval Since Last Radiation:  2 weeks 07/09/20:  Insertion of radioactive I-125 seeds into the prostate gland; 145 Gy, definitive therapy with placement of SpaceOAR VUE gel.  Narrative:  The patient returns today for routine follow-up.  He is complaining of increased urinary frequency and urinary hesitation symptoms. He filled out a questionnaire regarding urinary function today providing and overall IPSS score of 7 characterizing his symptoms as mild with occasional mild dysuria, urgency, frequency and nocturia x3 which is pretty close to his baseline LUTS.  He specifically denies gross hematuria, incomplete bladder emptying or incontinence.  His pre-implant score was 14. He has had some soft stools but denies diarrhea or abdominal pain.  He reports a healthy appetite and is maintaining his weight.  He denies any significant change in his energy level.  Overall, he is quite pleased with his progress to date.  ALLERGIES:  is allergic to allegra-d allergy & congestion [fexofenadine-pseudoephed er], lubiprostone, lubiprostone, nabumetone, oxycodone hcl, primidone, tramadol, amlodipine, atorvastatin, codeine phosphate, esomeprazole magnesium, ezetimibe-simvastatin, famotidine, fexofenadine-pseudoephed er, hydrocodone, nizatidine, and ticlopidine hcl.  Meds: Current Outpatient Medications  Medication Sig Dispense Refill  . acetaminophen (TYLENOL) 325 MG tablet Take 2 tablets (650 mg total) by mouth every 6 (six) hours as needed for mild pain (or Fever >/= 101). 30 tablet 0  .  aspirin 81 MG tablet Take 81 mg by mouth daily.      . Bacitracin-Polymyxin B (NEOSPORIN) 500-10000 UNIT/GM OINT Apply topically. Prn to legs    . Cholecalciferol (VITAMIN D) 1000 UNITS capsule Take 1,000 Units by mouth daily. Vitamin d 3 geltab    . diclofenac sodium (VOLTAREN) 1 % GEL Apply 2 g topically 4 (four) times daily. (Patient taking differently: Apply 2 g topically as needed. ) 100 g 3  . fluticasone (FLONASE) 50 MCG/ACT nasal spray Place 2 sprays into both nostrils daily as needed for allergies. 16 g 11  . Ipratropium-Albuterol (COMBIVENT RESPIMAT) 20-100 MCG/ACT AERS Inhale 2 Act into the lungs 4 (four) times daily - after meals and at bedtime. (Patient taking differently: Inhale 2 Act into the lungs as needed for shortness of breath. ) 1 Inhaler 11  . linaclotide (LINZESS) 145 MCG CAPS capsule Take 1 capsule (145 mcg total) by mouth daily as needed. 30 capsule 11  . loratadine (CLARITIN) 10 MG tablet Take 1 tablet (10 mg total) by mouth daily. (Patient taking differently: Take 10 mg by mouth daily as needed. ) 30 tablet 11  . meclizine (ANTIVERT) 12.5 MG tablet Take 1 tablet (12.5 mg total) by mouth 3 (three) times daily as needed for dizziness. 60 tablet 1  . olmesartan (BENICAR) 40 MG tablet TAKE 1 TABLET BY MOUTH EVERY DAY 90 tablet 2  . pyridoxine (B-6) 100 MG tablet Take 100 mg by mouth daily.    [name] torsemide (DEMADEX) 20 MG tablet Take 1-2 tablets (20-40 mg total) by mouth daily. 90 tablet 3  . vitamin B-12 (CYANOCOBALAMIN) 1000 MCG tablet Take 1,000 mcg by mouth daily. 1  tab po qd     No current facility-administered medications for this visit.    Physical Findings: In general this is a well appearing Caucasian male in no acute distress. He's alert and oriented x4 and appropriate throughout the examination. Cardiopulmonary assessment is negative for acute distress and he exhibits normal effort.   Lab Findings: Lab Results  Component Value Date   WBC 9.6 07/05/2020   HGB  14.3 07/05/2020   HCT 43.1 07/05/2020   MCV 95.8 07/05/2020   PLT 293 07/05/2020    Radiographic Findings:  Patient underwent CT imaging in our clinic for post implant dosimetry. The CT will be reviewed by Dr. Tammi Klippel to confirm there is an adequate distribution of radioactive seeds throughout the prostate gland and ensure that there are no seeds in or near the rectum.  We suspect the final radiation plan and dosimetry will show appropriate coverage of the prostate gland. He understands that we will call and inform him of any unexpected findings on further review of his imaging and dosimetry.  Impression/Plan:  74 y.o. gentleman with Stage T1c adenocarcinoma of the prostate with Gleason Score of 3+4, and PSA of 5.7. The patient is recovering from the effects of radiation. His urinary symptoms should gradually improve over the next 4-6 months. We talked about this today. He is encouraged by his improvement already and is otherwise pleased with his outcome. We also talked about long-term follow-up for prostate cancer following seed implant. He understands that ongoing PSA determinations and digital rectal exams will help perform surveillance to rule out disease recurrence. He has a follow up appointment scheduled with Dr. Jeffie Pollock on 07/31/2020. He understands what to expect with his PSA measures. Patient was also educated today about some of the long-term effects from radiation including a small risk for rectal bleeding and possibly erectile dysfunction. We talked about some of the general management approaches to these potential complications. However, I did encourage the patient to contact our office or return at any point if he has questions or concerns related to his previous radiation and prostate cancer.    Nicholos Johns, PA-C

## 2020-07-23 NOTE — Telephone Encounter (Signed)
CALLED PATIENT TO REMIND OF POST SEED APPTS. FOR 07-24-20, SPOKE WITH PATIENT AND HE IS AWARE OF THESE APPTS.

## 2020-07-24 ENCOUNTER — Ambulatory Visit
Admission: RE | Admit: 2020-07-24 | Discharge: 2020-07-24 | Disposition: A | Discharge status: Home / Self Care | Payer: Medicare Other | Source: Ambulatory Visit | Attending: Radiation Oncology | Admitting: Radiation Oncology

## 2020-07-24 ENCOUNTER — Encounter: Payer: Self-pay | Admitting: Urology

## 2020-07-24 ENCOUNTER — Ambulatory Visit
Admission: RE | Admit: 2020-07-24 | Discharge: 2020-07-24 | Disposition: A | Discharge status: Home / Self Care | Payer: Medicare Other | Source: Ambulatory Visit | Attending: Urology | Admitting: Urology

## 2020-07-24 ENCOUNTER — Other Ambulatory Visit: Payer: Self-pay

## 2020-07-24 VITALS — BP 163/68 | HR 76 | Temp 97.9°F | Resp 20 | Ht 69.0 in | Wt 274.0 lb

## 2020-07-24 DIAGNOSIS — C61 Malignant neoplasm of prostate: Secondary | ICD-10-CM | POA: Insufficient documentation

## 2020-07-24 NOTE — Progress Notes (Signed)
  Radiation Oncology         [phone] ________________________________  Name: Joe [name] MRN: [id number]  Date: 07/24/2020  DOB: [birth year]  COMPLEX SIMULATION NOTE  NARRATIVE:  The patient was brought to the [facility] today following prostate seed implantation approximately one month ago.  Identity was confirmed.  All relevant records and images related to the planned course of therapy were reviewed.  Then, the patient was set-up supine.  CT images were obtained.  The CT images were loaded into the planning software.  Then the prostate and rectum were contoured.  Treatment planning then occurred.  The implanted iodine 125 seeds were identified by the physics staff for projection of radiation distribution  I have requested : 3D Simulation  I have requested a DVH of the following structures: Prostate and rectum.    ________________________________  Sheral Apley Tammi Klippel, M.D.

## 2020-07-24 NOTE — Progress Notes (Signed)
Patient here today for a post seed appointment with Freeman Caldron PA. Patient states nocturia 3 times per night. Patient states mild dysuria. Patient states mild hematuria right after surgery but it has stopped 2 days ago. Patient states having soft stools. Patient states having a week stream. Patient states that he is emptying his bladder completely. Patient states occasional urgency. Patient states that he does not have a follow-up appointment with the urologist. Patient denies having leakage.

## 2020-07-25 ENCOUNTER — Encounter: Payer: Self-pay | Admitting: Medical Oncology

## 2020-07-30 ENCOUNTER — Encounter: Payer: Self-pay | Admitting: Radiation Oncology

## 2020-07-30 DIAGNOSIS — C61 Malignant neoplasm of prostate: Secondary | ICD-10-CM | POA: Diagnosis not present

## 2020-07-30 NOTE — Progress Notes (Signed)
  Radiation Oncology         [phone] ________________________________  Name: Joe [name] MRN: [id number]  Date: 07/30/2020  DOB: Feb 05, 1946  3D Planning Note   Prostate Brachytherapy Post-Implant Dosimetry  Diagnosis: 74 y.o. gentleman with Stage T1c adenocarcinoma of the prostate with Gleason score of 3+4, and PSA of 5.7.  Narrative: On a previous date, Joe [name] returned following prostate seed implantation for post implant planning. He underwent CT scan complex simulation to delineate the three-dimensional structures of the pelvis and demonstrate the radiation distribution.  Since that time, the seed localization, and complex isodose planning with dose volume histograms have now been completed.  Results:   Prostate Coverage - The dose of radiation delivered to the 90% or more of the prostate gland (D90) was 97.5% of the prescription dose. This exceeds our goal of greater than 90%. Rectal Sparing - The volume of rectal tissue receiving the prescription dose or higher was 0.0 cc. This falls under our thresholds tolerance of 1.0 cc.  Impression: The prostate seed implant appears to show adequate target coverage and appropriate rectal sparing.  Plan:  The patient will continue to follow with urology for ongoing PSA determinations. I would anticipate a high likelihood for local tumor control with minimal risk for rectal morbidity.  ________________________________  Sheral Apley Tammi Klippel, M.D.

## 2020-08-01 ENCOUNTER — Ambulatory Visit (HOSPITAL_COMMUNITY): Payer: Medicare Other | Attending: Cardiovascular Disease

## 2020-08-01 ENCOUNTER — Other Ambulatory Visit: Payer: Self-pay

## 2020-08-01 DIAGNOSIS — R011 Cardiac murmur, unspecified: Secondary | ICD-10-CM | POA: Diagnosis not present

## 2020-08-01 LAB — ECHOCARDIOGRAM COMPLETE
AR max vel: 1.75 cm2
AV Area VTI: 1.97 cm2
AV Area mean vel: 1.71 cm2
AV Mean grad: 8 mmHg
AV Peak grad: 18 mmHg
Ao pk vel: 2.12 m/s
Area-P 1/2: 3.65 cm2
S' Lateral: 3 cm

## 2020-08-26 ENCOUNTER — Other Ambulatory Visit: Payer: Self-pay

## 2020-08-27 ENCOUNTER — Encounter: Payer: Self-pay | Admitting: Internal Medicine

## 2020-08-27 ENCOUNTER — Ambulatory Visit (INDEPENDENT_AMBULATORY_CARE_PROVIDER_SITE_OTHER): Payer: Medicare Other | Admitting: Internal Medicine

## 2020-08-27 ENCOUNTER — Other Ambulatory Visit: Payer: Self-pay

## 2020-08-27 VITALS — BP 130/80 | HR 76 | Temp 98.8°F | Ht 69.0 in | Wt 272.1 lb

## 2020-08-27 DIAGNOSIS — I10 Essential (primary) hypertension: Secondary | ICD-10-CM

## 2020-08-27 DIAGNOSIS — F411 Generalized anxiety disorder: Secondary | ICD-10-CM | POA: Diagnosis not present

## 2020-08-27 DIAGNOSIS — E538 Deficiency of other specified B group vitamins: Secondary | ICD-10-CM

## 2020-08-27 DIAGNOSIS — R739 Hyperglycemia, unspecified: Secondary | ICD-10-CM

## 2020-08-27 DIAGNOSIS — G629 Polyneuropathy, unspecified: Secondary | ICD-10-CM | POA: Diagnosis not present

## 2020-08-27 DIAGNOSIS — J449 Chronic obstructive pulmonary disease, unspecified: Secondary | ICD-10-CM

## 2020-08-27 DIAGNOSIS — C61 Malignant neoplasm of prostate: Secondary | ICD-10-CM

## 2020-08-27 DIAGNOSIS — R270 Ataxia, unspecified: Secondary | ICD-10-CM

## 2020-08-27 DIAGNOSIS — E559 Vitamin D deficiency, unspecified: Secondary | ICD-10-CM | POA: Diagnosis not present

## 2020-08-27 LAB — COMPREHENSIVE METABOLIC PANEL
ALT: 29 U/L (ref 0–53)
AST: 23 U/L (ref 0–37)
Albumin: 4.1 g/dL (ref 3.5–5.2)
Alkaline Phosphatase: 72 U/L (ref 39–117)
BUN: 18 mg/dL (ref 6–23)
CO2: 27 mEq/L (ref 19–32)
Calcium: 9.3 mg/dL (ref 8.4–10.5)
Chloride: 102 mEq/L (ref 96–112)
Creatinine, Ser: 1.51 mg/dL — ABNORMAL HIGH (ref 0.40–1.50)
GFR: 45.98 mL/min — ABNORMAL LOW (ref >60.00)
Glucose, Bld: 94 mg/dL (ref 70–99)
Potassium: 4.8 mEq/L (ref 3.5–5.1)
Sodium: 136 mEq/L (ref 135–145)
Total Bilirubin: 0.6 mg/dL (ref 0.2–1.2)
Total Protein: 7.3 g/dL (ref 6.0–8.3)

## 2020-08-27 LAB — HEMOGLOBIN A1C: Hgb A1c MFr Bld: 5.9 % (ref 4.6–6.5)

## 2020-08-27 LAB — TSH: TSH: 0.2 u[IU]/mL — ABNORMAL LOW (ref 0.35–4.50)

## 2020-08-27 NOTE — Assessment & Plan Note (Signed)
XRT seeds 2021

## 2020-08-27 NOTE — Progress Notes (Signed)
Subjective:  Patient ID: Joe [name], male    DOB: [birth year]  Age: 75 y.o. MRN: [id number]  CC: No chief complaint on file.   HPI [name] presents for prostate cancer - s/p XRT seeds C/o neuropathy, LBP, gait disorder   Pt refused a COVID19 vaccination   Outpatient Medications Prior to Visit  Medication Sig Dispense Refill  . acetaminophen (TYLENOL) 325 MG tablet Take 2 tablets (650 mg total) by mouth every 6 (six) hours as needed for mild pain (or Fever >/= 101). 30 tablet 0  . aspirin 81 MG tablet Take 81 mg by mouth daily.    . Bacitracin-Polymyxin B (NEOSPORIN) 500-10000 UNIT/GM OINT Apply topically. Prn to legs    . Cholecalciferol (VITAMIN D) 1000 UNITS capsule Take 1,000 Units by mouth daily. Vitamin d 3 geltab    . diclofenac sodium (VOLTAREN) 1 % GEL Apply 2 g topically 4 (four) times daily. (Patient taking differently: Apply 2 g topically as needed.) 100 g 3  . fluticasone (FLONASE) 50 MCG/ACT nasal spray Place 2 sprays into both nostrils daily as needed for allergies. 16 g 11  . Ipratropium-Albuterol (COMBIVENT RESPIMAT) 20-100 MCG/ACT AERS Inhale 2 Act into the lungs 4 (four) times daily - after meals and at bedtime. (Patient taking differently: Inhale 2 Act into the lungs as needed for shortness of breath.) 1 Inhaler 11  . linaclotide (LINZESS) 145 MCG CAPS capsule Take 1 capsule (145 mcg total) by mouth daily as needed. 30 capsule 11  . loratadine (CLARITIN) 10 MG tablet Take 1 tablet (10 mg total) by mouth daily. (Patient taking differently: Take 10 mg by mouth daily as needed.) 30 tablet 11  . meclizine (ANTIVERT) 12.5 MG tablet Take 1 tablet (12.5 mg total) by mouth 3 (three) times daily as needed for dizziness. 60 tablet 1  . olmesartan (BENICAR) 40 MG tablet TAKE 1 TABLET BY MOUTH EVERY DAY 90 tablet 2  . pyridoxine (B-6) 100 MG tablet Take 100 mg by mouth daily.    [name] torsemide (DEMADEX) 20 MG tablet Take 1-2 tablets (20-40 mg total) by mouth daily. 90  tablet 3  . vitamin B-12 (CYANOCOBALAMIN) 1000 MCG tablet Take 1,000 mcg by mouth daily. 1  tab po qd     No facility-administered medications prior to visit.    ROS: Review of Systems  Constitutional: Negative for appetite change, fatigue and unexpected weight change.  HENT: Positive for congestion. Negative for nosebleeds, sneezing, sore throat and trouble swallowing.   Eyes: Negative for itching and visual disturbance.  Respiratory: Positive for shortness of breath. Negative for cough.   Cardiovascular: Negative for chest pain, palpitations and leg swelling.  Gastrointestinal: Negative for abdominal distention, blood in stool, diarrhea and nausea.  Genitourinary: Negative for frequency and hematuria.  Musculoskeletal: Positive for back pain, gait problem, neck pain and neck stiffness. Negative for joint swelling.  Skin: Negative for rash.  Neurological: Positive for light-headedness. Negative for dizziness, tremors, speech difficulty and weakness.  Psychiatric/Behavioral: Negative for agitation, dysphoric mood and sleep disturbance. The patient is not nervous/anxious.     Objective:  BP 130/80 (BP Location: Left Arm, Patient Position: Sitting, Cuff Size: Large)   Pulse 76   Temp 98.8 F (37.1 C) (Oral)   Ht 5\' 9"  (1.753 m)   Wt 272 lb 2 oz (123.4 kg)   SpO2 97%   BMI 40.19 kg/m   BP Readings from Last 3 Encounters:  08/27/20 130/80  07/24/20 (!) 163/68  07/09/20 [name])  153/84    Wt Readings from Last 3 Encounters:  08/27/20 272 lb 2 oz (123.4 kg)  07/24/20 274 lb (124.3 kg)  07/09/20 267 lb 12.8 oz (121.5 kg)    Physical Exam Constitutional:      General: He is not in acute distress.    Appearance: He is well-developed. He is obese.     Comments: NAD  HENT:     Mouth/Throat:     Mouth: Oropharynx is clear and moist.  Eyes:     Conjunctiva/sclera: Conjunctivae normal.     Pupils: Pupils are equal, round, and reactive to light.  Neck:     Thyroid: No thyromegaly.      Vascular: No JVD.  Cardiovascular:     Rate and Rhythm: Normal rate and regular rhythm.     Pulses: Intact distal pulses.     Heart sounds: Normal heart sounds. No murmur heard. No friction rub. No gallop.   Pulmonary:     Effort: Pulmonary effort is normal. No respiratory distress.     Breath sounds: Normal breath sounds. No wheezing or rales.  Chest:     Chest wall: No tenderness.  Abdominal:     General: Bowel sounds are normal. There is no distension.     Palpations: Abdomen is soft. There is no mass.     Tenderness: There is no abdominal tenderness. There is no guarding or rebound.  Musculoskeletal:        General: No tenderness or edema. Normal range of motion.     Cervical back: Normal range of motion.  Lymphadenopathy:     Cervical: No cervical adenopathy.  Skin:    General: Skin is warm and dry.     Findings: No rash.  Neurological:     Mental Status: He is alert and oriented to person, place, and time.     Cranial Nerves: No cranial nerve deficit.     Motor: No abnormal muscle tone.     Coordination: He displays a negative Romberg sign. Coordination abnormal.     Gait: Gait normal.     Deep Tendon Reflexes: Reflexes are normal and symmetric.  Psychiatric:        Mood and Affect: Mood and affect normal.        Behavior: Behavior normal.        Thought Content: Thought content normal.        Judgment: Judgment normal.   ataxic Walker   Lab Results  Component Value Date   WBC 9.6 07/05/2020   HGB 14.3 07/05/2020   HCT 43.1 07/05/2020   PLT 293 07/05/2020   GLUCOSE 96 07/05/2020   CHOL 159 05/09/2020   TRIG 121 05/09/2020   HDL 34 (L) 05/09/2020   LDLDIRECT 152.5 10/04/2009   LDLCALC 103 (H) 05/09/2020   ALT 29 07/05/2020   AST 25 07/05/2020   NA 138 07/05/2020   K 5.1 07/05/2020   CL 104 07/05/2020   CREATININE 1.63 (H) 07/05/2020   BUN 22 07/05/2020   CO2 23 07/05/2020   TSH 0.20 (L) 05/09/2020   PSA 5.04 (H) 08/08/2019   INR 1.0 07/05/2020    HGBA1C 5.8 (H) 05/09/2020    No results found.  Assessment & Plan:    Sonda Primes, MD

## 2020-08-27 NOTE — Assessment & Plan Note (Signed)
Alprazolam prn  Potential benefits of a long term benzodiazepines  use as well as potential risks  and complications were explained to the patient and were aknowledged. 

## 2020-08-27 NOTE — Assessment & Plan Note (Signed)
Vit D 

## 2020-08-27 NOTE — Assessment & Plan Note (Signed)
On B12 

## 2020-08-27 NOTE — Assessment & Plan Note (Signed)
BP Readings from Last 3 Encounters:  08/27/20 130/80  07/24/20 (!) 163/68  07/09/20 (!) 153/84

## 2020-08-27 NOTE — Assessment & Plan Note (Signed)
Amitripyline

## 2020-08-27 NOTE — Assessment & Plan Note (Signed)
Pt refused a COVID19 shot

## 2020-08-27 NOTE — Assessment & Plan Note (Signed)
Pt lost wt 

## 2020-08-29 ENCOUNTER — Other Ambulatory Visit: Payer: Self-pay | Admitting: Internal Medicine

## 2020-08-29 DIAGNOSIS — E7800 Pure hypercholesterolemia, unspecified: Secondary | ICD-10-CM

## 2020-10-02 ENCOUNTER — Other Ambulatory Visit: Payer: Self-pay | Admitting: Internal Medicine

## 2020-10-10 ENCOUNTER — Telehealth: Payer: Self-pay | Admitting: Internal Medicine

## 2020-10-10 NOTE — Telephone Encounter (Signed)
LVM for pt to rtn my call to schedule awv with nha. Please schedule if patient calls the office.

## 2020-10-30 DIAGNOSIS — R350 Frequency of micturition: Secondary | ICD-10-CM | POA: Diagnosis not present

## 2020-11-06 ENCOUNTER — Ambulatory Visit: Payer: Medicare Other | Admitting: Diagnostic Neuroimaging

## 2020-11-06 ENCOUNTER — Telehealth: Payer: Self-pay | Admitting: Diagnostic Neuroimaging

## 2020-11-06 ENCOUNTER — Encounter: Payer: Self-pay | Admitting: Diagnostic Neuroimaging

## 2020-11-06 VITALS — BP 142/67 | HR 77 | Ht 69.0 in | Wt 276.0 lb

## 2020-11-06 DIAGNOSIS — E785 Hyperlipidemia, unspecified: Secondary | ICD-10-CM | POA: Diagnosis not present

## 2020-11-06 DIAGNOSIS — R6889 Other general symptoms and signs: Secondary | ICD-10-CM | POA: Diagnosis not present

## 2020-11-06 DIAGNOSIS — M4807 Spinal stenosis, lumbosacral region: Secondary | ICD-10-CM

## 2020-11-06 DIAGNOSIS — R531 Weakness: Secondary | ICD-10-CM

## 2020-11-06 DIAGNOSIS — Z7689 Persons encountering health services in other specified circumstances: Secondary | ICD-10-CM | POA: Diagnosis not present

## 2020-11-06 NOTE — Telephone Encounter (Signed)
[employer] medicare order sent to [facility]. No auth they will reach out to the patient to schedule.  
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
## 2020-11-06 NOTE — Patient Instructions (Signed)
-   check MRI cervical / lumbar spine - check neuropathy labs - recommend PT evaluation; increase physical activity / mobility

## 2020-11-11 LAB — C-REACTIVE PROTEIN: CRP: 5 mg/L (ref 0–10)

## 2020-11-11 LAB — MULTIPLE MYELOMA PANEL, SERUM
Albumin SerPl Elph-Mcnc: 3.6 g/dL (ref 2.9–4.4)
Albumin/Glob SerPl: 1 (ref 0.7–1.7)
Alpha 1: 0.3 g/dL (ref 0.0–0.4)
Alpha2 Glob SerPl Elph-Mcnc: 0.9 g/dL (ref 0.4–1.0)
B-Globulin SerPl Elph-Mcnc: 1.3 g/dL (ref 0.7–1.3)
Gamma Glob SerPl Elph-Mcnc: 1.3 g/dL (ref 0.4–1.8)
Globulin, Total: 3.7 g/dL (ref 2.2–3.9)
IgA/Immunoglobulin A, Serum: 409 mg/dL (ref 61–437)
IgG (Immunoglobin G), Serum: 1352 mg/dL (ref 603–1613)
IgM (Immunoglobulin M), Srm: 64 mg/dL (ref 15–143)
Total Protein: 7.3 g/dL (ref 6.0–8.5)

## 2020-11-11 LAB — VITAMIN B12: Vitamin B-12: 518 pg/mL (ref 232–1245)

## 2020-11-11 LAB — SEDIMENTATION RATE: Sed Rate: 19 mm/hr (ref 0–30)

## 2020-11-11 LAB — ANA W/REFLEX: ANA Titer 1: NEGATIVE

## 2020-11-11 LAB — HOMOCYSTEINE: Homocysteine: 15.4 umol/L (ref 0.0–19.2)

## 2020-11-11 LAB — SJOGREN'S SYNDROME ANTIBODS(SSA + SSB)
ENA SSA (RO) Ab: <0.2 AI (ref 0.0–0.9)
ENA SSB (LA) Ab: <0.2 AI (ref 0.0–0.9)

## 2020-11-11 LAB — METHYLMALONIC ACID, SERUM: Methylmalonic Acid: 298 nmol/L (ref 0–378)

## 2020-11-18 ENCOUNTER — Encounter: Payer: Self-pay | Admitting: *Deleted

## 2020-11-22 ENCOUNTER — Ambulatory Visit
Admission: RE | Admit: 2020-11-22 | Discharge: 2020-11-22 | Disposition: A | Discharge status: Home / Self Care | Payer: Medicare Other | Source: Ambulatory Visit | Attending: Diagnostic Neuroimaging | Admitting: Diagnostic Neuroimaging

## 2020-11-22 DIAGNOSIS — M4807 Spinal stenosis, lumbosacral region: Secondary | ICD-10-CM

## 2020-11-22 DIAGNOSIS — R531 Weakness: Secondary | ICD-10-CM

## 2020-11-22 DIAGNOSIS — M48061 Spinal stenosis, lumbar region without neurogenic claudication: Secondary | ICD-10-CM | POA: Diagnosis not present

## 2020-11-26 ENCOUNTER — Encounter: Payer: Self-pay | Admitting: Internal Medicine

## 2020-11-26 ENCOUNTER — Other Ambulatory Visit: Payer: Self-pay

## 2020-11-26 ENCOUNTER — Ambulatory Visit (INDEPENDENT_AMBULATORY_CARE_PROVIDER_SITE_OTHER): Payer: Medicare Other | Admitting: Internal Medicine

## 2020-11-26 DIAGNOSIS — R531 Weakness: Secondary | ICD-10-CM | POA: Diagnosis not present

## 2020-11-26 DIAGNOSIS — G720 Drug-induced myopathy: Secondary | ICD-10-CM

## 2020-11-26 DIAGNOSIS — R29898 Other symptoms and signs involving the musculoskeletal system: Secondary | ICD-10-CM | POA: Diagnosis not present

## 2020-11-26 DIAGNOSIS — T466X5A Adverse effect of antihyperlipidemic and antiarteriosclerotic drugs, initial encounter: Secondary | ICD-10-CM | POA: Diagnosis not present

## 2020-11-26 DIAGNOSIS — I25810 Atherosclerosis of coronary artery bypass graft(s) without angina pectoris: Secondary | ICD-10-CM

## 2020-11-26 DIAGNOSIS — R600 Localized edema: Secondary | ICD-10-CM

## 2020-11-26 DIAGNOSIS — R635 Abnormal weight gain: Secondary | ICD-10-CM

## 2020-11-26 DIAGNOSIS — M79604 Pain in right leg: Secondary | ICD-10-CM | POA: Diagnosis not present

## 2020-11-26 DIAGNOSIS — M79605 Pain in left leg: Secondary | ICD-10-CM | POA: Diagnosis not present

## 2020-11-26 DIAGNOSIS — E7800 Pure hypercholesterolemia, unspecified: Secondary | ICD-10-CM

## 2020-11-26 LAB — COMPREHENSIVE METABOLIC PANEL
ALT: 27 U/L (ref 0–53)
AST: 20 U/L (ref 0–37)
Albumin: 3.9 g/dL (ref 3.5–5.2)
Alkaline Phosphatase: 79 U/L (ref 39–117)
BUN: 18 mg/dL (ref 6–23)
CO2: 27 mEq/L (ref 19–32)
Calcium: 9.4 mg/dL (ref 8.4–10.5)
Chloride: 104 mEq/L (ref 96–112)
Creatinine, Ser: 1.5 mg/dL (ref 0.40–1.50)
GFR: 46.26 mL/min — ABNORMAL LOW (ref >60.00)
Glucose, Bld: 87 mg/dL (ref 70–99)
Potassium: 4.6 mEq/L (ref 3.5–5.1)
Sodium: 139 mEq/L (ref 135–145)
Total Bilirubin: 0.8 mg/dL (ref 0.2–1.2)
Total Protein: 6.9 g/dL (ref 6.0–8.3)

## 2020-11-26 LAB — TSH: TSH: 0.75 u[IU]/mL (ref 0.35–4.50)

## 2020-11-26 LAB — HEMOGLOBIN A1C: Hgb A1c MFr Bld: 5.9 % (ref 4.6–6.5)

## 2020-11-26 LAB — T4, FREE: Free T4: 1.9 ng/dL — ABNORMAL HIGH (ref 0.60–1.60)

## 2020-11-26 NOTE — Assessment & Plan Note (Signed)
Diet discussed Cont w/Torsemide

## 2020-11-26 NOTE — Assessment & Plan Note (Addendum)
F/u w/Dr Penumalli Spinal stenosis Tramadol prn - stopped, loose wt LS MRI 3/22 IMPRESSION: This MRI of the lumbar spine without contrast shows the following: 1.   Disc protrusions and other mild degenerative changes at T10-T11, T11-T12 and L1-L2.  There is no nerve root compression or significant spinal stenosis at these levels. 2.   At L2-L3, there is moderately severe spinal stenosis (AP diameter 6.2 mm) due to degenerative changes.  There does not appear to be nerve root compression at this level. 3.   At L3-L4, there is severe spinal stenosis (AP diameter 5.4 mm) due to left paramedian disc herniation and other degenerative changes.  There is probable left L4 nerve root compression and there is also potential bilateral L3 and right L4 nerve root compression. 4.   At L4-L5, there is moderately severe spinal stenosis (AP diameter 6.6 mm) due to degenerative changes.  There is potential for right L4 and right L5 nerve root compression. 5.   At L5-S1, there are degenerative changes causing moderately severe bilateral foraminal narrowing with potential for L5 nerve root compression to either side.

## 2020-11-26 NOTE — Progress Notes (Signed)
Subjective:  Patient ID: Joe [name], male    DOB: 11-22-1945  Age: 75 y.o. MRN: [id number]  CC: Follow-up (3 month f/u)   HPI Joe [name] presents for spinal stenosis, LBP, falls, feet swelling - worse  Outpatient Medications Prior to Visit  Medication Sig Dispense Refill  . acetaminophen (TYLENOL) 325 MG tablet Take 2 tablets (650 mg total) by mouth every 6 (six) hours as needed for mild pain (or Fever >/= 101). 30 tablet 0  . aspirin 81 MG tablet Take 81 mg by mouth daily.    . Cholecalciferol (VITAMIN D) 1000 UNITS capsule Take 1,000 Units by mouth daily. Vitamin d 3 geltab    . diclofenac sodium (VOLTAREN) 1 % GEL Apply 2 g topically 4 (four) times daily. (Patient taking differently: Apply 2 g topically as needed.) 100 g 3  . fluticasone (FLONASE) 50 MCG/ACT nasal spray Place 2 sprays into both nostrils daily as needed for allergies. 16 g 11  . Ipratropium-Albuterol (COMBIVENT RESPIMAT) 20-100 MCG/ACT AERS Inhale 2 Act into the lungs 4 (four) times daily - after meals and at bedtime. (Patient taking differently: Inhale 2 Act into the lungs as needed for shortness of breath.) 1 Inhaler 11  . linaclotide (LINZESS) 145 MCG CAPS capsule Take 1 capsule (145 mcg total) by mouth daily as needed. 30 capsule 11  . loratadine (CLARITIN) 10 MG tablet Take 1 tablet (10 mg total) by mouth daily. (Patient taking differently: Take 10 mg by mouth daily as needed.) 30 tablet 11  . meclizine (ANTIVERT) 12.5 MG tablet Take 1 tablet (12.5 mg total) by mouth 3 (three) times daily as needed for dizziness. 60 tablet 1  . olmesartan (BENICAR) 40 MG tablet TAKE 1 TABLET BY MOUTH EVERY DAY 90 tablet 2  . pyridoxine (B-6) 100 MG tablet Take 100 mg by mouth daily.    [name] torsemide (DEMADEX) 20 MG tablet TAKE 1-2 TABLETS (20-40 MG TOTAL) BY MOUTH DAILY. 180 tablet 1  . vitamin B-12 (CYANOCOBALAMIN) 1000 MCG tablet Take 1,000 mcg by mouth daily. 1  tab po qd    . Bacitracin-Polymyxin B (NEOSPORIN)  500-10000 UNIT/GM OINT Apply topically. Prn to legs (Patient not taking: Reported on 11/26/2020)     No facility-administered medications prior to visit.    ROS: Review of Systems  Constitutional: Positive for unexpected weight change. Negative for appetite change and fatigue.  HENT: Negative for congestion, nosebleeds, sneezing, sore throat and trouble swallowing.   Eyes: Negative for itching and visual disturbance.  Respiratory: Negative for cough.   Cardiovascular: Positive for leg swelling. Negative for chest pain and palpitations.  Gastrointestinal: Negative for abdominal distention, blood in stool, diarrhea and nausea.  Genitourinary: Negative for frequency and hematuria.  Musculoskeletal: Positive for back pain and gait problem. Negative for joint swelling and neck pain.  Skin: Negative for rash.  Neurological: Positive for weakness and numbness. Negative for dizziness, tremors and speech difficulty.  Psychiatric/Behavioral: Negative for agitation, dysphoric mood, sleep disturbance and suicidal ideas. The patient is not nervous/anxious.     Objective:  BP (!) 162/70 (BP Location: Left Arm)   Pulse 60   Temp 98.6 F (37 C) (Oral)   Ht 5\' 9"  (1.753 m)   Wt 281 lb 9.6 oz (127.7 kg)   SpO2 97%   BMI 41.59 kg/m   BP Readings from Last 3 Encounters:  11/26/20 (!) 162/70  11/06/20 (!) 142/67  08/27/20 130/80    Wt Readings from Last 3 Encounters:  11/26/20  281 lb 9.6 oz (127.7 kg)  11/06/20 276 lb (125.2 kg)  08/27/20 272 lb 2 oz (123.4 kg)    Physical Exam Constitutional:      General: He is not in acute distress.    Appearance: He is well-developed.     Comments: NAD  Eyes:     Conjunctiva/sclera: Conjunctivae normal.     Pupils: Pupils are equal, round, and reactive to light.  Neck:     Thyroid: No thyromegaly.     Vascular: No JVD.  Cardiovascular:     Rate and Rhythm: Normal rate and regular rhythm.     Heart sounds: Normal heart sounds. No murmur heard. No  friction rub. No gallop.   Pulmonary:     Effort: Pulmonary effort is normal. No respiratory distress.     Breath sounds: Normal breath sounds. No wheezing or rales.  Chest:     Chest wall: No tenderness.  Abdominal:     General: Bowel sounds are normal. There is no distension.     Palpations: Abdomen is soft. There is no mass.     Tenderness: There is no abdominal tenderness. There is no guarding or rebound.  Musculoskeletal:        General: Tenderness present. Normal range of motion.     Cervical back: Normal range of motion.     Right lower leg: Edema present.     Left lower leg: Edema present.  Lymphadenopathy:     Cervical: No cervical adenopathy.  Skin:    General: Skin is warm and dry.     Findings: No rash.  Neurological:     Mental Status: He is alert and oriented to person, place, and time.     Cranial Nerves: No cranial nerve deficit.     Motor: No abnormal muscle tone.     Coordination: Coordination normal.     Gait: Gait normal.     Deep Tendon Reflexes: Reflexes are normal and symmetric.  Psychiatric:        Behavior: Behavior normal.        Thought Content: Thought content normal.        Judgment: Judgment normal.    Using a walker 2+ edema  Lab Results  Component Value Date   WBC 9.6 07/05/2020   HGB 14.3 07/05/2020   HCT 43.1 07/05/2020   PLT 293 07/05/2020   GLUCOSE 94 08/27/2020   CHOL 159 05/09/2020   TRIG 121 05/09/2020   HDL 34 (L) 05/09/2020   LDLDIRECT 152.5 10/04/2009   LDLCALC 103 (H) 05/09/2020   ALT 29 08/27/2020   AST 23 08/27/2020   NA 136 08/27/2020   K 4.8 08/27/2020   CL 102 08/27/2020   CREATININE 1.51 (H) 08/27/2020   BUN 18 08/27/2020   CO2 27 08/27/2020   TSH 0.20 (L) 08/27/2020   PSA 5.04 (H) 08/08/2019   INR 1.0 07/05/2020   HGBA1C 5.9 08/27/2020    MR LUMBAR SPINE WO CONTRAST  Result Date: 11/23/2020  [facility] [address] [place], [place] [address] [phone] NEUROIMAGING REPORT  STUDY DATE: 11/22/2020 PATIENT NAME: Joe [name] DOB: 04-21-1946 MRN: [id number] EXAM: MRI of the lumbar spine without contrast ORDERING CLINICIAN: Andrey Spearman MD CLINICAL HISTORY: 75 year old man with leg weakness and spinal stenosis COMPARISON FILMS: None TECHNIQUE: MRI of the lumbar spine was obtained utilizing 4 mm sagittal slices from L38-10 down to the lower sacrum with T1, T2 and inversion recovery views. In addition 4 mm axial  slices from Y4-0 down to L5-S1 level were included with T1 and T2 weighted views. CONTRAST: None IMAGING SITE: [facility], [address] [place], [place], [place] FINDINGS: On sagittal images, the spine is imaged from T11 to the sacrum.   The conus medullaris and cauda equine appear normal.   The vertebral bodies are normally aligned.  There is moderately severe loss of disc height at L5-S1.  Endplate degenerative changes are noted at L2-L3 and L5-S1. On sagittal images, not covered by axial views, disc protrusions are noted at T10-T11 and T11-T12.  There does not appear to be foraminal narrowing at these levels.  The lumbar discs and interspaces were further evaluated on axial views from T12 to S1 as follows: T12-L1: The disc and interspace appear normal. L1-L2: There is minimal disc bulging and minimal facet hypertrophy.  No significant foraminal narrowing or lateral recess stenosis.  No spinal stenosis or nerve root compression. L2-L3: There is moderately severe spinal stenosis (AP diameter 6.2 mm) due to large broad disc protrusion, mild facet hypertrophy with small joint effusions and minimal ligamenta flava hypertrophy.  These degenerative changes also cause mild bilateral foraminal narrowing and mild to moderate left and mild right lateral recess stenosis.  There does not appear to be nerve root compression at this level. L3-L4: There is severe spinal stenosis (AP diameter 5.4 mm) due to a left paramedian disc herniation, facet hypertrophy and ligamenta flava  hypertrophy.  There is moderately severe bilateral foraminal narrowing, severe left lateral recess stenosis and moderately severe right lateral recess stenosis.  There is probable left L4 nerve root compression and there is also potential for compression of both L3 nerve roots in the right L4 nerve root. L4-L5: There is moderately severe spinal stenosis (AP diameter 6.6 mm) due to poor disc protrusion, moderately severe facet hypertrophy and ligamenta flava hypertrophy.  Additionally, there is moderate left and moderately severe right foraminal narrowing, and moderate left and moderately severe right lateral recess stenosis.  There is potential for right L4 and L5 nerve root compression. L5-S1: There is reduced disc height, disc protrusion, severe facet hypertrophy.  These changes cause moderately severe bilateral foraminal narrowing and mild lateral recess stenosis.  There is potential for L5 nerve root compression.   This MRI of the lumbar spine without contrast shows the following: 1.   Disc protrusions and other mild degenerative changes at T10-T11, T11-T12 and L1-L2.  There is no nerve root compression or significant spinal stenosis at these levels. 2.   At L2-L3, there is moderately severe spinal stenosis (AP diameter 6.2 mm) due to degenerative changes.  There does not appear to be nerve root compression at this level. 3.   At L3-L4, there is severe spinal stenosis (AP diameter 5.4 mm) due to left paramedian disc herniation and other degenerative changes.  There is probable left L4 nerve root compression and there is also potential bilateral L3 and right L4 nerve root compression. 4.   At L4-L5, there is moderately severe spinal stenosis (AP diameter 6.6 mm) due to degenerative changes.  There is potential for right L4 and right L5 nerve root compression. 5.   At L5-S1, there are degenerative changes causing moderately severe bilateral foraminal narrowing with potential for L5 nerve root compression to  either side. INTERPRETING PHYSICIAN: Richard A. Felecia Shelling, MD, PhD, FAAN Certified in  Neuroimaging by [employer]    Assessment & Plan:    Walker Kehr, MD

## 2020-11-26 NOTE — Assessment & Plan Note (Signed)
Use a walker 

## 2020-11-26 NOTE — Assessment & Plan Note (Signed)
F/u w/Dr Penumalli Spinal stenosis LS MRI 3/22 IMPRESSION: This MRI of the lumbar spine without contrast shows the following: 1.   Disc protrusions and other mild degenerative changes at T10-T11, T11-T12 and L1-L2.  There is no nerve root compression or significant spinal stenosis at these levels. 2.   At L2-L3, there is moderately severe spinal stenosis (AP diameter 6.2 mm) due to degenerative changes.  There does not appear to be nerve root compression at this level. 3.   At L3-L4, there is severe spinal stenosis (AP diameter 5.4 mm) due to left paramedian disc herniation and other degenerative changes.  There is probable left L4 nerve root compression and there is also potential bilateral L3 and right L4 nerve root compression. 4.   At L4-L5, there is moderately severe spinal stenosis (AP diameter 6.6 mm) due to degenerative changes.  There is potential for right L4 and right L5 nerve root compression. 5.   At L5-S1, there are degenerative changes causing moderately severe bilateral foraminal narrowing with potential for L5 nerve root compression to either side.

## 2020-11-26 NOTE — Assessment & Plan Note (Signed)
SCD [employer] - Rx given

## 2020-11-26 NOTE — Assessment & Plan Note (Signed)
Continue aspirin. Intolerant to statins. Chronic. No angina 

## 2020-11-26 NOTE — Addendum Note (Signed)
Addended by: Boris Lown B on: 11/26/2020 09:25 AM   Modules accepted: Orders

## 2020-11-29 ENCOUNTER — Other Ambulatory Visit: Payer: Self-pay | Admitting: Internal Medicine

## 2020-11-29 DIAGNOSIS — Z789 Other specified health status: Secondary | ICD-10-CM

## 2020-11-29 DIAGNOSIS — E0590 Thyrotoxicosis, unspecified without thyrotoxic crisis or storm: Secondary | ICD-10-CM | POA: Insufficient documentation

## 2020-11-29 NOTE — Progress Notes (Signed)
[facility])                                            [place] Team                                        Statin Quality Measure Assessment    11/29/2020  Joe [name] 1946/08/23 [id number]   I am a [facility] clinical pharmacist that reviews patients for statin quality initiatives.     Per review of chart and payor information, patient has a diagnosis of cardiovascular disease but is not currently filling a statin prescription.  This places patient into the [facility] (Statin Use In Patients with Cardiovascular Disease) measure for CMS.  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 A message was sent prior and after the appointment on 11/26/20 regarding exclusion code renewal G72.0 - awaiting response from PCP.   Next appointment w/PCP is 02/26/2021.       Component Value Date/Time   CHOL 159 05/09/2020 0915   CHOL 165 12/28/2017 0910   TRIG 121 05/09/2020 0915   HDL 34 (L) 05/09/2020 0915   HDL 38 (L) 12/28/2017 0910   CHOLHDL 4.7 05/09/2020 0915   VLDL 20.0 05/09/2019 0900   LDLCALC 103 (H) 05/09/2020 0915   LDLDIRECT 152.5 10/04/2009 1316     Please consider ONE of the following recommendations:  ? Initiate high intensity statin Atorvastatin 40mg  once daily, #90, 3 refills   Rosuvastatin 20mg  once daily, #90, 3 refills    ? Initiate moderate intensity  statin with reduced frequency if prior  statin intolerance 1x weekly, #13, 3 refills   2x weekly, #26, 3 refills   3x weekly, #39, 3 refills    ? Code for past statin intolerance  (required annually)  **Provider Requirements:** Must asociate code during an office visit or telehealth encounter   Drug Induced Myopathy G72.0   Myalgia M79.1   Myositis, unspecified M60.9   Myopathy, unspecified G72.9   Rhabdomyolysis M62.82     Please let us know your decision.    Thank you!    Kristeen Miss, PharmD Clinical Pharmacist [place] Cell: [phone]

## 2020-12-02 ENCOUNTER — Telehealth: Payer: Self-pay | Admitting: Internal Medicine

## 2020-12-02 DIAGNOSIS — R29898 Other symptoms and signs involving the musculoskeletal system: Secondary | ICD-10-CM

## 2020-12-02 DIAGNOSIS — R600 Localized edema: Secondary | ICD-10-CM

## 2020-12-02 NOTE — Telephone Encounter (Signed)
Patient called and said that [employer] is needing the order sent to them for FCD device. Please advise   Fax: [phone]

## 2020-12-02 NOTE — Telephone Encounter (Signed)
OK. Pls fax Thx

## 2020-12-02 NOTE — Telephone Encounter (Signed)
Faxed order to [facility].Marland KitchenJohny Chess

## 2020-12-02 NOTE — Assessment & Plan Note (Signed)
Statins and other treatments for hyperlipidemia were discussed

## 2020-12-05 ENCOUNTER — Telehealth: Payer: Self-pay | Admitting: *Deleted

## 2020-12-05 NOTE — Telephone Encounter (Signed)
Patient states that he spoke to [employer] yesterday and they still have not received the order

## 2020-12-05 NOTE — Telephone Encounter (Signed)
Called patient, informed him MRI lumber spine showed spinal stenosis at L3-4 and L4-5. He can consider spine surgery consult. He stated he's had surgeries on his spine in past, will consider it. He stated he has a lot going on with swelling in his legs, will let us know what he decides. Patient verbalized understanding, appreciation.

## 2020-12-09 NOTE — Addendum Note (Signed)
Addended by: Earnstine Regal on: 12/09/2020 09:51 AM   Modules accepted: Orders

## 2020-12-09 NOTE — Telephone Encounter (Signed)
Generated DME order for the SCD faxed AGAIN to [place].Marland KitchenJohny Chess

## 2020-12-25 ENCOUNTER — Telehealth: Payer: Self-pay | Admitting: Internal Medicine

## 2020-12-25 NOTE — Telephone Encounter (Signed)
[facility] calling, states they got the order for the compressor but did not get the clinical notes. Requesting we fax those over to them.  Joe [name]

## 2020-12-26 NOTE — Telephone Encounter (Signed)
Faxed clinical notes again to fax # given below.Marland KitchenJohny [name]

## 2021-02-13 ENCOUNTER — Ambulatory Visit: Payer: Medicare Other

## 2021-02-13 NOTE — Telephone Encounter (Signed)
   [employer] requesting office notes be re- faxed to [phone]

## 2021-02-13 NOTE — Telephone Encounter (Signed)
Faxed clinical notes AGAIN!!! Notes has been faxed on several occassions and which we have receive confirmation that they was received. If they do not get this time will need to come to the office to pick-up notes.Marland KitchenJohny [name]

## 2021-02-26 ENCOUNTER — Ambulatory Visit (INDEPENDENT_AMBULATORY_CARE_PROVIDER_SITE_OTHER): Payer: Medicare Other

## 2021-02-26 ENCOUNTER — Encounter: Payer: Self-pay | Admitting: Internal Medicine

## 2021-02-26 ENCOUNTER — Ambulatory Visit (INDEPENDENT_AMBULATORY_CARE_PROVIDER_SITE_OTHER): Payer: Medicare Other | Admitting: Internal Medicine

## 2021-02-26 ENCOUNTER — Other Ambulatory Visit: Payer: Self-pay

## 2021-02-26 VITALS — BP 142/70 | HR 85 | Temp 98.9°F | Ht 69.0 in | Wt 280.0 lb

## 2021-02-26 DIAGNOSIS — I10 Essential (primary) hypertension: Secondary | ICD-10-CM

## 2021-02-26 DIAGNOSIS — M62838 Other muscle spasm: Secondary | ICD-10-CM | POA: Diagnosis not present

## 2021-02-26 DIAGNOSIS — R635 Abnormal weight gain: Secondary | ICD-10-CM | POA: Diagnosis not present

## 2021-02-26 DIAGNOSIS — Z0000 Encounter for general adult medical examination without abnormal findings: Secondary | ICD-10-CM

## 2021-02-26 DIAGNOSIS — E538 Deficiency of other specified B group vitamins: Secondary | ICD-10-CM

## 2021-02-26 DIAGNOSIS — R209 Unspecified disturbances of skin sensation: Secondary | ICD-10-CM

## 2021-02-26 DIAGNOSIS — R600 Localized edema: Secondary | ICD-10-CM | POA: Diagnosis not present

## 2021-02-26 DIAGNOSIS — G8250 Quadriplegia, unspecified: Secondary | ICD-10-CM

## 2021-02-26 MED ORDER — TORSEMIDE 20 MG PO TABS
40.0000 mg | ORAL_TABLET | Freq: Every day | ORAL | 1 refills | Status: DC
Start: 2021-02-26 — End: 2021-05-21

## 2021-02-26 MED ORDER — DIAZEPAM 5 MG PO TABS
5.0000 mg | ORAL_TABLET | Freq: Two times a day (BID) | ORAL | 3 refills | Status: DC | PRN
Start: 2021-02-26 — End: 2022-03-11

## 2021-02-26 NOTE — Assessment & Plan Note (Signed)
On B12 Chronic Risks associated with treatment noncompliance were discussed. Compliance was encouraged.
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
## 2021-02-26 NOTE — Patient Instructions (Signed)
Increase Torsemide to 40 - 60 mg/d

## 2021-02-26 NOTE — Patient Instructions (Addendum)
Joe [name] , Thank you for taking time to come for your Medicare Wellness Visit. I appreciate your ongoing commitment to your health goals. Please review the following plan we discussed and let me know if I can assist you in the future.   Screening recommendations/referrals: Colonoscopy: last done 01/24/2008; patient refused Recommended yearly ophthalmology/optometry visit for glaucoma screening and checkup Recommended yearly dental visit for hygiene and checkup  Vaccinations: Influenza vaccine: declined Pneumococcal vaccine: 06/25/2015, 02/01/2017 Tdap vaccine: declined Shingles vaccine: declined   Covid-19: declined  Advanced directives: Advance directive discussed with you today. Even though you declined this today please call our office should you change your mind and we can give you the proper paperwork for you to fill out.  Conditions/risks identified: My goal is to wake up the next day.  Next appointment: Please schedule your next Medicare Wellness Visit with your Nurse Health Advisor in 1 year by calling [phone].  Preventive Care 73 Years and Older, Male Preventive care refers to lifestyle choices and visits with your health care provider that can promote health and wellness. What does preventive care include? A yearly physical exam. This is also called an annual well check. Dental exams once or twice a year. Routine eye exams. Ask your health care provider how often you should have your eyes checked. Personal lifestyle choices, including: Daily care of your teeth and gums. Regular physical activity. Eating a healthy diet. Avoiding tobacco and drug use. Limiting alcohol use. Practicing safe sex. Taking low doses of aspirin every day. Taking vitamin and mineral supplements as recommended by your health care provider. What happens during an annual well check? The services and screenings done by your health care provider during your annual well check will depend on your  age, overall health, lifestyle risk factors, and family history of disease. Counseling  Your health care provider may ask you questions about your: Alcohol use. Tobacco use. Drug use. Emotional well-being. Home and relationship well-being. Sexual activity. Eating habits. History of falls. Memory and ability to understand (cognition). Work and work Statistician. Screening  You may have the following tests or measurements: Height, weight, and BMI. Blood pressure. Lipid and cholesterol levels. These may be checked every 5 years, or more frequently if you are over 41 years old. Skin check. Lung cancer screening. You may have this screening every year starting at age 18 if you have a 30-pack-year history of smoking and currently smoke or have quit within the past 15 years. Fecal occult blood test (FOBT) of the stool. You may have this test every year starting at age 78. Flexible sigmoidoscopy or colonoscopy. You may have a sigmoidoscopy every 5 years or a colonoscopy every 10 years starting at age 69. Prostate cancer screening. Recommendations will vary depending on your family history and other risks. Hepatitis C blood test. Hepatitis B blood test. Sexually transmitted disease (STD) testing. Diabetes screening. This is done by checking your blood sugar (glucose) after you have not eaten for a while (fasting). You may have this done every 1-3 years. Abdominal aortic aneurysm (AAA) screening. You may need this if you are a current or former smoker. Osteoporosis. You may be screened starting at age 70 if you are at high risk. Talk with your health care provider about your test results, treatment options, and if necessary, the need for more tests. Vaccines  Your health care provider may recommend certain vaccines, such as: Influenza vaccine. This is recommended every year. Tetanus, diphtheria, and acellular pertussis (Tdap, Td) vaccine.  You may need a Td booster every 10 years. Zoster  vaccine. You may need this after age 16. Pneumococcal 13-valent conjugate (PCV13) vaccine. One dose is recommended after age 84. Pneumococcal polysaccharide (PPSV23) vaccine. One dose is recommended after age 9. Talk to your health care provider about which screenings and vaccines you need and how often you need them. This information is not intended to replace advice given to you by your health care provider. Make sure you discuss any questions you have with your health care provider. Document Released: 09/06/2015 Document Revised: 04/29/2016 Document Reviewed: 06/11/2015 Elsevier Interactive Patient Education  2017 [employer] Prevention in the Home Falls can cause injuries. They can happen to people of all ages. There are many things you can do to make your home safe and to help prevent falls. What can I do on the outside of my home? Regularly fix the edges of walkways and driveways and fix any cracks. Remove anything that might make you trip as you walk through a door, such as a raised step or threshold. Trim any bushes or trees on the path to your home. Use bright outdoor lighting. Clear any walking paths of anything that might make someone trip, such as rocks or tools. Regularly check to see if handrails are loose or broken. Make sure that both sides of any steps have handrails. Any raised decks and porches should have guardrails on the edges. Have any leaves, snow, or ice cleared regularly. Use sand or salt on walking paths during winter. Clean up any spills in your garage right away. This includes oil or grease spills. What can I do in the bathroom? Use night lights. Install grab bars by the toilet and in the tub and shower. Do not use towel bars as grab bars. Use non-skid mats or decals in the tub or shower. If you need to sit down in the shower, use a plastic, non-slip stool. Keep the floor dry. Clean up any water that spills on the floor as soon as it happens. Remove  soap buildup in the tub or shower regularly. Attach bath mats securely with double-sided non-slip rug tape. Do not have throw rugs and other things on the floor that can make you trip. What can I do in the bedroom? Use night lights. Make sure that you have a light by your bed that is easy to reach. Do not use any sheets or blankets that are too big for your bed. They should not hang down onto the floor. Have a firm chair that has side arms. You can use this for support while you get dressed. Do not have throw rugs and other things on the floor that can make you trip. What can I do in the kitchen? Clean up any spills right away. Avoid walking on wet floors. Keep items that you use a lot in easy-to-reach places. If you need to reach something above you, use a strong step stool that has a grab bar. Keep electrical cords out of the way. Do not use floor polish or wax that makes floors slippery. If you must use wax, use non-skid floor wax. Do not have throw rugs and other things on the floor that can make you trip. What can I do with my stairs? Do not leave any items on the stairs. Make sure that there are handrails on both sides of the stairs and use them. Fix handrails that are broken or loose. Make sure that handrails are as long as  the stairways. Check any carpeting to make sure that it is firmly attached to the stairs. Fix any carpet that is loose or worn. Avoid having throw rugs at the top or bottom of the stairs. If you do have throw rugs, attach them to the floor with carpet tape. Make sure that you have a light switch at the top of the stairs and the bottom of the stairs. If you do not have them, ask someone to add them for you. What else can I do to help prevent falls? Wear shoes that: Do not have high heels. Have rubber bottoms. Are comfortable and fit you well. Are closed at the toe. Do not wear sandals. If you use a stepladder: Make sure that it is fully opened. Do not climb a  closed stepladder. Make sure that both sides of the stepladder are locked into place. Ask someone to hold it for you, if possible. Clearly mark and make sure that you can see: Any grab bars or handrails. First and last steps. Where the edge of each step is. Use tools that help you move around (mobility aids) if they are needed. These include: Canes. Walkers. Scooters. Crutches. Turn on the lights when you go into a dark area. Replace any light bulbs as soon as they burn out. Set up your furniture so you have a clear path. Avoid moving your furniture around. If any of your floors are uneven, fix them. If there are any pets around you, be aware of where they are. Review your medicines with your doctor. Some medicines can make you feel dizzy. This can increase your chance of falling. Ask your doctor what other things that you can do to help prevent falls. This information is not intended to replace advice given to you by your health care provider. Make sure you discuss any questions you have with your health care provider. Document Released: 06/06/2009 Document Revised: 01/16/2016 Document Reviewed: 09/14/2014 Elsevier Interactive Patient Education  2017 [employer].

## 2021-02-26 NOTE — Progress Notes (Signed)
Subjective:  Patient ID: Joe [name], male    DOB: [birth year]  Age: 75 y.o. MRN: [id number]  CC: Follow-up (3 month f/u)   HPI KUNIO CUMMISKEY presents for leg swelling - worse F/u HTN, neuropathy  Outpatient Medications Prior to Visit  Medication Sig Dispense Refill   acetaminophen (TYLENOL) 325 MG tablet Take 2 tablets (650 mg total) by mouth every 6 (six) hours as needed for mild pain (or Fever >/= 101). 30 tablet 0   aspirin 81 MG tablet Take 81 mg by mouth daily.     Cholecalciferol (VITAMIN D) 1000 UNITS capsule Take 1,000 Units by mouth daily. Vitamin d 3 geltab     diclofenac sodium (VOLTAREN) 1 % GEL Apply 2 g topically 4 (four) times daily. (Patient taking differently: Apply 2 g topically as needed.) 100 g 3   fluticasone (FLONASE) 50 MCG/ACT nasal spray Place 2 sprays into both nostrils daily as needed for allergies. 16 g 11   Ipratropium-Albuterol (COMBIVENT RESPIMAT) 20-100 MCG/ACT AERS Inhale 2 Act into the lungs 4 (four) times daily - after meals and at bedtime. (Patient taking differently: Inhale 2 Act into the lungs as needed for shortness of breath.) 1 Inhaler 11   linaclotide (LINZESS) 145 MCG CAPS capsule Take 1 capsule (145 mcg total) by mouth daily as needed. 30 capsule 11   loratadine (CLARITIN) 10 MG tablet Take 1 tablet (10 mg total) by mouth daily. (Patient taking differently: Take 10 mg by mouth daily as needed.) 30 tablet 11   meclizine (ANTIVERT) 12.5 MG tablet Take 1 tablet (12.5 mg total) by mouth 3 (three) times daily as needed for dizziness. 60 tablet 1   olmesartan (BENICAR) 40 MG tablet TAKE 1 TABLET BY MOUTH EVERY DAY 90 tablet 2   pyridoxine (B-6) 100 MG tablet Take 100 mg by mouth daily.     torsemide (DEMADEX) 20 MG tablet TAKE 1-2 TABLETS (20-40 MG TOTAL) BY MOUTH DAILY. 180 tablet 1   vitamin B-12 (CYANOCOBALAMIN) 1000 MCG tablet Take 1,000 mcg by mouth daily. 1  tab po qd     No facility-administered medications prior to visit.     ROS: Review of Systems  Constitutional:  Positive for fatigue. Negative for appetite change and unexpected weight change.  HENT:  Negative for congestion, nosebleeds, sneezing, sore throat and trouble swallowing.   Eyes:  Negative for itching and visual disturbance.  Respiratory:  Negative for cough.   Cardiovascular:  Positive for leg swelling. Negative for chest pain and palpitations.  Gastrointestinal:  Negative for abdominal distention, blood in stool, diarrhea and nausea.  Genitourinary:  Negative for frequency and hematuria.  Musculoskeletal:  Positive for gait problem. Negative for back pain, joint swelling and neck pain.  Skin:  Negative for rash.  Neurological:  Positive for weakness and numbness. Negative for dizziness, tremors and speech difficulty.  Psychiatric/Behavioral:  Negative for agitation, dysphoric mood, sleep disturbance and suicidal ideas. The patient is not nervous/anxious.    Objective:  BP (!) 142/70 (BP Location: Left Arm)   Pulse 85   Temp 98.9 F (37.2 C) (Oral)   Ht 5\' 9"  (1.753 m)   Wt 280 lb 3.2 oz (127.1 kg)   SpO2 97%   BMI 41.38 kg/m   BP Readings from Last 3 Encounters:  02/26/21 (!) 142/70  11/26/20 (!) 162/70  11/06/20 (!) 142/67    Wt Readings from Last 3 Encounters:  02/26/21 280 lb 3.2 oz (127.1 kg)  11/26/20 281 lb 9.6 oz (  127.7 kg)  11/06/20 276 lb (125.2 kg)    Physical Exam Constitutional:      General: He is not in acute distress.    Appearance: He is well-developed. He is obese.     Comments: NAD  Eyes:     Conjunctiva/sclera: Conjunctivae normal.     Pupils: Pupils are equal, round, and reactive to light.  Neck:     Thyroid: No thyromegaly.     Vascular: No JVD.  Cardiovascular:     Rate and Rhythm: Normal rate and regular rhythm.     Heart sounds: Normal heart sounds. No murmur heard.   No friction rub. No gallop.  Pulmonary:     Effort: Pulmonary effort is normal. No respiratory distress.     Breath sounds:  Normal breath sounds. No wheezing or rales.  Chest:     Chest wall: No tenderness.  Abdominal:     General: Bowel sounds are normal. There is no distension.     Palpations: Abdomen is soft. There is no mass.     Tenderness: There is no abdominal tenderness. There is no guarding or rebound.  Musculoskeletal:        General: Swelling and tenderness present. Normal range of motion.     Cervical back: Normal range of motion.  Lymphadenopathy:     Cervical: No cervical adenopathy.  Skin:    General: Skin is warm and dry.     Findings: No rash.  Neurological:     Mental Status: He is alert and oriented to person, place, and time.     Cranial Nerves: No cranial nerve deficit.     Motor: Weakness present. No abnormal muscle tone.     Coordination: Coordination abnormal.     Gait: Gait abnormal.     Deep Tendon Reflexes: Reflexes are normal and symmetric.  Psychiatric:        Behavior: Behavior normal.        Thought Content: Thought content normal.        Judgment: Judgment normal.  Edema 1+ B legs Hands w/trace edema  Lab Results  Component Value Date   WBC 9.6 07/05/2020   HGB 14.3 07/05/2020   HCT 43.1 07/05/2020   PLT 293 07/05/2020   GLUCOSE 87 11/26/2020   CHOL 159 05/09/2020   TRIG 121 05/09/2020   HDL 34 (L) 05/09/2020   LDLDIRECT 152.5 10/04/2009   LDLCALC 103 (H) 05/09/2020   ALT 27 11/26/2020   AST 20 11/26/2020   NA 139 11/26/2020   K 4.6 11/26/2020   CL 104 11/26/2020   CREATININE 1.50 11/26/2020   BUN 18 11/26/2020   CO2 27 11/26/2020   TSH 0.75 11/26/2020   PSA 5.04 (H) 08/08/2019   INR 1.0 07/05/2020   HGBA1C 5.9 11/26/2020    MR LUMBAR SPINE WO CONTRAST  Result Date: 11/23/2020  [facility] [address] [place], [place] [address] [phone] NEUROIMAGING REPORT STUDY DATE: 11/22/2020 PATIENT NAME: SHIHEEM CORPORAN DOB: [birth year] MRN: [id number] EXAM: MRI of the lumbar spine without contrast ORDERING CLINICIAN: Andrey Spearman MD CLINICAL HISTORY: 75 year old man with leg weakness and spinal stenosis COMPARISON FILMS: None TECHNIQUE: MRI of the lumbar spine was obtained utilizing 4 mm sagittal slices from S49-67 down to the lower sacrum with T1, T2 and inversion recovery views. In addition 4 mm axial slices from R9-1 down to L5-S1 level were included with T1 and T2 weighted views. CONTRAST: None IMAGING SITE: [facility], [address],  [place], [place] FINDINGS: On sagittal images, the spine is imaged from T11 to the sacrum.   The conus medullaris and cauda equine appear normal.   The vertebral bodies are normally aligned.  There is moderately severe loss of disc height at L5-S1.  Endplate degenerative changes are noted at L2-L3 and L5-S1. On sagittal images, not covered by axial views, disc protrusions are noted at T10-T11 and T11-T12.  There does not appear to be foraminal narrowing at these levels.  The lumbar discs and interspaces were further evaluated on axial views from T12 to S1 as follows: T12-L1: The disc and interspace appear normal. L1-L2: There is minimal disc bulging and minimal facet hypertrophy.  No significant foraminal narrowing or lateral recess stenosis.  No spinal stenosis or nerve root compression. L2-L3: There is moderately severe spinal stenosis (AP diameter 6.2 mm) due to large broad disc protrusion, mild facet hypertrophy with small joint effusions and minimal ligamenta flava hypertrophy.  These degenerative changes also cause mild bilateral foraminal narrowing and mild to moderate left and mild right lateral recess stenosis.  There does not appear to be nerve root compression at this level. L3-L4: There is severe spinal stenosis (AP diameter 5.4 mm) due to a left paramedian disc herniation, facet hypertrophy and ligamenta flava hypertrophy.  There is moderately severe bilateral foraminal narrowing, severe left lateral recess stenosis and moderately severe right lateral recess stenosis.  There  is probable left L4 nerve root compression and there is also potential for compression of both L3 nerve roots in the right L4 nerve root. L4-L5: There is moderately severe spinal stenosis (AP diameter 6.6 mm) due to poor disc protrusion, moderately severe facet hypertrophy and ligamenta flava hypertrophy.  Additionally, there is moderate left and moderately severe right foraminal narrowing, and moderate left and moderately severe right lateral recess stenosis.  There is potential for right L4 and L5 nerve root compression. L5-S1: There is reduced disc height, disc protrusion, severe facet hypertrophy.  These changes cause moderately severe bilateral foraminal narrowing and mild lateral recess stenosis.  There is potential for L5 nerve root compression.   This MRI of the lumbar spine without contrast shows the following: 1.   Disc protrusions and other mild degenerative changes at T10-T11, T11-T12 and L1-L2.  There is no nerve root compression or significant spinal stenosis at these levels. 2.   At L2-L3, there is moderately severe spinal stenosis (AP diameter 6.2 mm) due to degenerative changes.  There does not appear to be nerve root compression at this level. 3.   At L3-L4, there is severe spinal stenosis (AP diameter 5.4 mm) due to left paramedian disc herniation and other degenerative changes.  There is probable left L4 nerve root compression and there is also potential bilateral L3 and right L4 nerve root compression. 4.   At L4-L5, there is moderately severe spinal stenosis (AP diameter 6.6 mm) due to degenerative changes.  There is potential for right L4 and right L5 nerve root compression. 5.   At L5-S1, there are degenerative changes causing moderately severe bilateral foraminal narrowing with potential for L5 nerve root compression to either side. INTERPRETING PHYSICIAN: Richard A. Felecia Shelling, MD, PhD, FAAN Certified in  Neuroimaging by  [employer]    Assessment & Plan:     Walker Kehr, MD

## 2021-02-26 NOTE — Assessment & Plan Note (Addendum)
7/22 Worse. Refractory SCD Bioteck - Rx given - no success getting the device.  Increase Torsemide to 40 - 60 mg/d

## 2021-02-26 NOTE — Assessment & Plan Note (Signed)
Worse Treat edema

## 2021-02-26 NOTE — Assessment & Plan Note (Signed)
Increase Torsemide to 40 - 60 mg/d

## 2021-02-26 NOTE — Assessment & Plan Note (Signed)
Chronic - using a walker

## 2021-02-26 NOTE — Assessment & Plan Note (Signed)
Worse Dizepam prn  Potential benefits of a short/long term benzodiazepines  use as well as potential risks  and complications were explained to the patient and were aknowledged.

## 2021-02-26 NOTE — Assessment & Plan Note (Signed)
Wt Readings from Last 3 Encounters:  02/26/21 280 lb 3.2 oz (127.1 kg)  11/26/20 281 lb 9.6 oz (127.7 kg)  11/06/20 276 lb (125.2 kg)

## 2021-04-11 ENCOUNTER — Other Ambulatory Visit: Payer: Self-pay | Admitting: Internal Medicine

## 2021-04-17 ENCOUNTER — Telehealth: Payer: Self-pay | Admitting: Lab

## 2021-04-17 NOTE — Progress Notes (Signed)
  Chronic Care Management   Note  04/17/2021 Name: DREAN REMME MRN: [id number] DOB: Jun 10, 1946  [name] is a 75 y.o. year old male who is a primary care patient of Plotnikov, Evie Lacks, MD. I reached out to [name] by phone today in response to a referral sent by Mr. Khazi Breckenridge Miera's PCP, Plotnikov, Evie Lacks, MD.   Mr. Sidman was given information about Chronic Care Management services today including:  CCM service includes personalized support from designated clinical staff supervised by his physician, including individualized plan of care and coordination with other care providers 24/7 contact phone numbers for assistance for urgent and routine care needs. Service will only be billed when office clinical staff spend 20 minutes or more in a month to coordinate care. Only one practitioner may furnish and bill the service in a calendar month. The patient may stop CCM services at any time (effective at the end of the month) by phone call to the office staff.   Patient did not agree to enrollment in care management services and does not wish to consider at this time.  Follow up plan:   [place]

## 2021-05-21 ENCOUNTER — Other Ambulatory Visit: Payer: Self-pay | Admitting: Internal Medicine

## 2021-06-03 ENCOUNTER — Encounter: Payer: Self-pay | Admitting: Internal Medicine

## 2021-06-03 ENCOUNTER — Other Ambulatory Visit: Payer: Self-pay

## 2021-06-03 ENCOUNTER — Ambulatory Visit (INDEPENDENT_AMBULATORY_CARE_PROVIDER_SITE_OTHER): Payer: Medicare Other | Admitting: Internal Medicine

## 2021-06-03 VITALS — BP 142/62 | HR 96 | Temp 98.1°F | Ht 69.0 in | Wt 279.4 lb

## 2021-06-03 DIAGNOSIS — R635 Abnormal weight gain: Secondary | ICD-10-CM

## 2021-06-03 DIAGNOSIS — K589 Irritable bowel syndrome without diarrhea: Secondary | ICD-10-CM | POA: Insufficient documentation

## 2021-06-03 DIAGNOSIS — I10 Essential (primary) hypertension: Secondary | ICD-10-CM | POA: Diagnosis not present

## 2021-06-03 DIAGNOSIS — E538 Deficiency of other specified B group vitamins: Secondary | ICD-10-CM | POA: Diagnosis not present

## 2021-06-03 DIAGNOSIS — K580 Irritable bowel syndrome with diarrhea: Secondary | ICD-10-CM | POA: Diagnosis not present

## 2021-06-03 DIAGNOSIS — N3281 Overactive bladder: Secondary | ICD-10-CM

## 2021-06-03 LAB — COMPREHENSIVE METABOLIC PANEL
ALT: 18 U/L (ref 0–53)
AST: 19 U/L (ref 0–37)
Albumin: 4.1 g/dL (ref 3.5–5.2)
Alkaline Phosphatase: 69 U/L (ref 39–117)
BUN: 33 mg/dL — ABNORMAL HIGH (ref 6–23)
CO2: 24 mEq/L (ref 19–32)
Calcium: 9.6 mg/dL (ref 8.4–10.5)
Chloride: 99 mEq/L (ref 96–112)
Creatinine, Ser: 1.86 mg/dL — ABNORMAL HIGH (ref 0.40–1.50)
GFR: 35.61 mL/min — ABNORMAL LOW (ref >60.00)
Glucose, Bld: 99 mg/dL (ref 70–99)
Potassium: 4.7 mEq/L (ref 3.5–5.1)
Sodium: 134 mEq/L — ABNORMAL LOW (ref 135–145)
Total Bilirubin: 0.8 mg/dL (ref 0.2–1.2)
Total Protein: 7.9 g/dL (ref 6.0–8.3)

## 2021-06-03 LAB — CBC WITH DIFFERENTIAL/PLATELET
Basophils Absolute: 0.1 10*3/uL (ref 0.0–0.1)
Basophils Relative: 0.7 % (ref 0.0–3.0)
Eosinophils Absolute: 0.2 10*3/uL (ref 0.0–0.7)
Eosinophils Relative: 2.1 % (ref 0.0–5.0)
HCT: 40.6 % (ref 39.0–52.0)
Hemoglobin: 13.6 g/dL (ref 13.0–17.0)
Lymphocytes Relative: 15.1 % (ref 12.0–46.0)
Lymphs Abs: 1.4 10*3/uL (ref 0.7–4.0)
MCHC: 33.6 g/dL (ref 30.0–36.0)
MCV: 95.3 fl (ref 78.0–100.0)
Monocytes Absolute: 0.7 10*3/uL (ref 0.1–1.0)
Monocytes Relative: 7.4 % (ref 3.0–12.0)
Neutro Abs: 7.1 10*3/uL (ref 1.4–7.7)
Neutrophils Relative %: 74.7 % (ref 43.0–77.0)
Platelets: 301 10*3/uL (ref 150.0–400.0)
RBC: 4.26 Mil/uL (ref 4.22–5.81)
RDW: 13.3 % (ref 11.5–15.5)
WBC: 9.5 10*3/uL (ref 4.0–10.5)

## 2021-06-03 LAB — TSH: TSH: 0.43 u[IU]/mL (ref 0.35–5.50)

## 2021-06-03 MED ORDER — DIPHENOXYLATE-ATROPINE 2.5-0.025 MG PO TABS: 1.0000 | ORAL_TABLET | Freq: Four times a day (QID) | ORAL | 0 refills | Status: AC | PRN

## 2021-06-03 MED ORDER — SACCHAROMYCES BOULARDII 250 MG PO CAPS: 250.0000 mg | ORAL_CAPSULE | Freq: Two times a day (BID) | ORAL | 1 refills | Status: AC

## 2021-06-03 NOTE — Assessment & Plan Note (Signed)
F/u w/Dr Wrenn  

## 2021-06-03 NOTE — Addendum Note (Signed)
Addended by: Boris Lown B on: 06/03/2021 10:11 AM   Modules accepted: Orders

## 2021-06-03 NOTE — Patient Instructions (Signed)
Lactose Intolerance, Adult Lactose is a natural sugar that is found in dairy milk and dairy products such as cheese and yogurt. Lactose is digested by lactase, a protein in your small intestine. Some people do not produce enough lactase to digest lactose. This is called lactose intolerance. Lactose intolerance is different from milk allergy, which is a more serious reaction to the protein in milk. What are the causes? Causes of lactose intolerance may include: Normal aging. The ability to produce lactase may lessen with age, causing lactose intolerance over time. Being born without the ability to make lactase. Digestive diseases such as gastroenteritis or inflammatory bowel disease (IBD). Surgery or injury to your small intestine. Infection in your intestines. Certain antibiotic medicines and cancer treatments. What are the signs or symptoms? Lactose intolerance can cause discomfort within 30 minutes to 2 hours after you eat or drink something that contains lactose. Symptoms may include: Nausea. Diarrhea. Cramps or pain in the abdomen. Bloating. You may have a full, tight, or painful feeling in the abdomen. Gas. How is this diagnosed? This condition may be diagnosed based on: Your symptoms and medical history. Lactose tolerance test. This test involves drinking a lactose solution and then having blood tests to measure the amount of glucose in your blood. If your blood glucose level does not go up, it means your body is not able to digest the lactose. Lactose breath test (hydrogen breath test). This test involves drinking a lactose solution and then exhaling into a type of bag while you digest the solution. Having a lot of hydrogen in your breath can be a sign of lactose intolerance. How is this treated? There is no treatment to improve your body's ability to produce lactase. However, you can manage your symptoms at home by: Limiting or avoiding dairy milk, dairy products, and other sources of  lactose. Taking lactase tablets when you eat or drink milk products. Lactase tablets are over-the-counter medicines that help to improve lactose digestion. You may also add lactase drops to regular milk. Adjusting your diet, such as drinking lactose-free milk. Lactose tolerance varies from person to person. Some people may be able to eat or drink small amounts of products that contain lactose, and other people may need to avoid all foods and drinks that contain lactose. Talk with your health care provider about what treatment is best for you. Follow these instructions at home: Limit or avoid foods, beverages, and medicines that contain lactose, as told by your health care provider. Keep track of which foods, beverages, or medicines cause symptoms so you can decide what to avoid in the future. Read food and medicine labels carefully. Avoid products that contain: Lactose. Milk solids. Casein. Whey. Take over-the-counter and prescription medicines (including lactase tablets) only as told by your health care provider. If you stop eating and drinking dairy products, make sure to get enough protein, calcium, and vitamin D from other foods. Work with your health care provider or a dietitian to make sure you get enough of those nutrients. Choose a milk substitute that has been fortified with vitamin D or calcium. Fortified means that vitamin D or calcium has been added to the product. Soy milk contains high-quality protein. Milks that are made from nuts or grains contain very small amounts of protein. These include almond milk and rice milk. Keep all follow-up visits. This is important. Contact a health care provider if: You have no relief from your symptoms after you have eliminated milk products and other sources of lactose.  Get help right away if: You have blood in your stool (feces). You have severe abdominal pain. Summary Lactose is a natural sugar that is found in dairy milk and dairy products  such as cheese and yogurt. Lactose is digested by lactase, which is a protein in the small intestine. Some people do not produce enough lactase to digest lactose. This is called lactose intolerance. Lactose intolerance can cause discomfort within 30 minutes to 2 hours after you eat or drink something that contains lactose. Limit or avoid foods, beverages, and medicines that contain lactose, as told by your health care provider. This information is not intended to replace advice given to you by your health care provider. Make sure you discuss any questions you have with your health care provider. Document Revised: 07/16/2020 Document Reviewed: 07/16/2020 Elsevier Patient Education  2022 [employer].

## 2021-06-03 NOTE — Assessment & Plan Note (Addendum)
Worsened IBS-D Imodium prn Florastor and Lomotil prn C diff, Giardia tests Pt is refusing colonoscopy, GI ref Milk free diet

## 2021-06-03 NOTE — Assessment & Plan Note (Signed)
Continue B12. 

## 2021-06-03 NOTE — Progress Notes (Signed)
Subjective:  Patient ID: Joe [name], male    DOB: Jun 04, 1946  Age: 75 y.o. MRN: [id number]  CC: Follow-up (3 month f/u)   HPI Joe [name] presents for IBS - D, messed up my pants this am F/u gait disorder, HTN, B12 def C/o peeing a lot, diarrhea x3 d    Outpatient Medications Prior to Visit  Medication Sig Dispense Refill   acetaminophen (TYLENOL) 325 MG tablet Take 2 tablets (650 mg total) by mouth every 6 (six) hours as needed for mild pain (or Fever >/= 101). 30 tablet 0   aspirin 81 MG tablet Take 81 mg by mouth daily.     Cholecalciferol (VITAMIN D) 1000 UNITS capsule Take 1,000 Units by mouth daily. Vitamin d 3 geltab     diazepam (VALIUM) 5 MG tablet Take 1 tablet (5 mg total) by mouth every 12 (twelve) hours as needed for anxiety or muscle spasms. 60 tablet 3   diclofenac sodium (VOLTAREN) 1 % GEL Apply 2 g topically 4 (four) times daily. (Patient taking differently: Apply 2 g topically as needed.) 100 g 3   fluticasone (FLONASE) 50 MCG/ACT nasal spray Place 2 sprays into both nostrils daily as needed for allergies. 16 g 11   Ipratropium-Albuterol (COMBIVENT RESPIMAT) 20-100 MCG/ACT AERS Inhale 2 Act into the lungs 4 (four) times daily - after meals and at bedtime. (Patient taking differently: Inhale 2 Act into the lungs as needed for shortness of breath.) 1 Inhaler 11   loratadine (CLARITIN) 10 MG tablet Take 1 tablet (10 mg total) by mouth daily. (Patient taking differently: Take 10 mg by mouth daily as needed.) 30 tablet 11   olmesartan (BENICAR) 40 MG tablet TAKE 1 TABLET BY MOUTH EVERY DAY 90 tablet 3   pyridoxine (B-6) 100 MG tablet Take 100 mg by mouth daily.     torsemide (DEMADEX) 20 MG tablet TAKE 2- 3 TABLETS BY MOUTH EVERY DAY 270 tablet 1   vitamin B-12 (CYANOCOBALAMIN) 1000 MCG tablet Take 1,000 mcg by mouth daily. 1  tab po qd     linaclotide (LINZESS) 145 MCG CAPS capsule Take 1 capsule (145 mcg total) by mouth daily as needed. 30 capsule 11   No  facility-administered medications prior to visit.    ROS: Review of Systems  Constitutional:  Positive for fatigue. Negative for appetite change and unexpected weight change.  HENT:  Negative for congestion, nosebleeds, sneezing, sore throat and trouble swallowing.   Eyes:  Negative for itching and visual disturbance.  Respiratory:  Negative for cough.   Cardiovascular:  Negative for chest pain, palpitations and leg swelling.  Gastrointestinal:  Positive for diarrhea. Negative for abdominal distention, blood in stool and nausea.  Genitourinary:  Positive for frequency and urgency. Negative for hematuria.  Musculoskeletal:  Positive for arthralgias, back pain and gait problem. Negative for joint swelling and neck pain.  Skin:  Negative for rash.  Neurological:  Negative for dizziness, tremors, speech difficulty and weakness.  Psychiatric/Behavioral:  Negative for agitation, dysphoric mood, sleep disturbance and suicidal ideas. The patient is not nervous/anxious.    Objective:  BP (!) 142/62 (BP Location: Left Arm)   Pulse 96   Temp 98.1 F (36.7 C) (Oral)   Ht 5\' 9"  (1.753 m)   Wt 279 lb 6.4 oz (126.7 kg)   SpO2 96%   BMI 41.26 kg/m   BP Readings from Last 3 Encounters:  06/03/21 (!) 142/62  02/26/21 (!) 142/70  02/26/21 (!) 142/70  Wt Readings from Last 3 Encounters:  06/03/21 279 lb 6.4 oz (126.7 kg)  02/26/21 280 lb (127 kg)  02/26/21 280 lb 3.2 oz (127.1 kg)    Physical Exam Constitutional:      General: He is not in acute distress.    Appearance: He is well-developed. He is obese.     Comments: NAD  Eyes:     Conjunctiva/sclera: Conjunctivae normal.     Pupils: Pupils are equal, round, and reactive to light.  Neck:     Thyroid: No thyromegaly.     Vascular: No JVD.  Cardiovascular:     Rate and Rhythm: Normal rate and regular rhythm.     Heart sounds: Normal heart sounds. No murmur heard.   No friction rub. No gallop.  Pulmonary:     Effort: Pulmonary  effort is normal. No respiratory distress.     Breath sounds: Normal breath sounds. No wheezing or rales.  Chest:     Chest wall: No tenderness.  Abdominal:     General: Bowel sounds are normal. There is no distension.     Palpations: Abdomen is soft. There is no mass.     Tenderness: There is no abdominal tenderness. There is no guarding or rebound.  Musculoskeletal:        General: No tenderness. Normal range of motion.     Cervical back: Normal range of motion.  Lymphadenopathy:     Cervical: No cervical adenopathy.  Skin:    General: Skin is warm and dry.     Findings: No rash.  Neurological:     Mental Status: He is alert and oriented to person, place, and time.     Cranial Nerves: No cranial nerve deficit.     Motor: No abnormal muscle tone.     Coordination: Coordination abnormal.     Gait: Gait normal.     Deep Tendon Reflexes: Reflexes are normal and symmetric.  Psychiatric:        Behavior: Behavior normal.        Thought Content: Thought content normal.        Judgment: Judgment normal.  Using a walker  Lab Results  Component Value Date   WBC 9.6 07/05/2020   HGB 14.3 07/05/2020   HCT 43.1 07/05/2020   PLT 293 07/05/2020   GLUCOSE 87 11/26/2020   CHOL 159 05/09/2020   TRIG 121 05/09/2020   HDL 34 (L) 05/09/2020   LDLDIRECT 152.5 10/04/2009   LDLCALC 103 (H) 05/09/2020   ALT 27 11/26/2020   AST 20 11/26/2020   NA 139 11/26/2020   K 4.6 11/26/2020   CL 104 11/26/2020   CREATININE 1.50 11/26/2020   BUN 18 11/26/2020   CO2 27 11/26/2020   TSH 0.75 11/26/2020   PSA 5.04 (H) 08/08/2019   INR 1.0 07/05/2020   HGBA1C 5.9 11/26/2020    MR LUMBAR SPINE WO CONTRAST  Result Date: 11/23/2020  [facility] [address], [place], [place] [address] [phone] NEUROIMAGING REPORT STUDY DATE: 11/22/2020 PATIENT NAME: Joe [name] DOB: Nov 30, 1945 MRN: [id number] EXAM: MRI of the lumbar spine without contrast ORDERING CLINICIAN: Andrey Spearman MD CLINICAL HISTORY: 75 year old man with leg weakness and spinal stenosis COMPARISON FILMS: None TECHNIQUE: MRI of the lumbar spine was obtained utilizing 4 mm sagittal slices from Q76-19 down to the lower sacrum with T1, T2 and inversion recovery views. In addition 4 mm axial slices from J0-9 down to L5-S1 level were included with T1 and  T2 weighted views. CONTRAST: None IMAGING SITE: [facility], [address] [place], [place], [place] FINDINGS: On sagittal images, the spine is imaged from T11 to the sacrum.   The conus medullaris and cauda equine appear normal.   The vertebral bodies are normally aligned.  There is moderately severe loss of disc height at L5-S1.  Endplate degenerative changes are noted at L2-L3 and L5-S1. On sagittal images, not covered by axial views, disc protrusions are noted at T10-T11 and T11-T12.  There does not appear to be foraminal narrowing at these levels.  The lumbar discs and interspaces were further evaluated on axial views from T12 to S1 as follows: T12-L1: The disc and interspace appear normal. L1-L2: There is minimal disc bulging and minimal facet hypertrophy.  No significant foraminal narrowing or lateral recess stenosis.  No spinal stenosis or nerve root compression. L2-L3: There is moderately severe spinal stenosis (AP diameter 6.2 mm) due to large broad disc protrusion, mild facet hypertrophy with small joint effusions and minimal ligamenta flava hypertrophy.  These degenerative changes also cause mild bilateral foraminal narrowing and mild to moderate left and mild right lateral recess stenosis.  There does not appear to be nerve root compression at this level. L3-L4: There is severe spinal stenosis (AP diameter 5.4 mm) due to a left paramedian disc herniation, facet hypertrophy and ligamenta flava hypertrophy.  There is moderately severe bilateral foraminal narrowing, severe left lateral recess stenosis and moderately severe right lateral recess stenosis.  There  is probable left L4 nerve root compression and there is also potential for compression of both L3 nerve roots in the right L4 nerve root. L4-L5: There is moderately severe spinal stenosis (AP diameter 6.6 mm) due to poor disc protrusion, moderately severe facet hypertrophy and ligamenta flava hypertrophy.  Additionally, there is moderate left and moderately severe right foraminal narrowing, and moderate left and moderately severe right lateral recess stenosis.  There is potential for right L4 and L5 nerve root compression. L5-S1: There is reduced disc height, disc protrusion, severe facet hypertrophy.  These changes cause moderately severe bilateral foraminal narrowing and mild lateral recess stenosis.  There is potential for L5 nerve root compression.   This MRI of the lumbar spine without contrast shows the following: 1.   Disc protrusions and other mild degenerative changes at T10-T11, T11-T12 and L1-L2.  There is no nerve root compression or significant spinal stenosis at these levels. 2.   At L2-L3, there is moderately severe spinal stenosis (AP diameter 6.2 mm) due to degenerative changes.  There does not appear to be nerve root compression at this level. 3.   At L3-L4, there is severe spinal stenosis (AP diameter 5.4 mm) due to left paramedian disc herniation and other degenerative changes.  There is probable left L4 nerve root compression and there is also potential bilateral L3 and right L4 nerve root compression. 4.   At L4-L5, there is moderately severe spinal stenosis (AP diameter 6.6 mm) due to degenerative changes.  There is potential for right L4 and right L5 nerve root compression. 5.   At L5-S1, there are degenerative changes causing moderately severe bilateral foraminal narrowing with potential for L5 nerve root compression to either side. INTERPRETING PHYSICIAN: Richard A. Felecia Shelling, MD, PhD, FAAN Certified in  Neuroimaging by [employer]    Assessment & Plan:   Problem  List Items Addressed This Visit     B12 deficiency    Continue B12      Essential hypertension  Cont on Benicar and Torsemide 20 mg/d      IBS (irritable bowel syndrome)    Worsened IBS-D Imodium prn Florastor and Lomotil prn C diff, Giardia tests      Weight gain    Stable wt lately         Follow-up: Return in about 3 months (around 09/03/2021) for a follow-up visit.  Walker Kehr, MD

## 2021-06-03 NOTE — Assessment & Plan Note (Signed)
Stable wt lately

## 2021-06-03 NOTE — Assessment & Plan Note (Signed)
Cont on Benicar and Torsemide 20 mg/d

## 2021-06-10 DIAGNOSIS — N3281 Overactive bladder: Secondary | ICD-10-CM | POA: Diagnosis not present

## 2021-06-10 DIAGNOSIS — I10 Essential (primary) hypertension: Secondary | ICD-10-CM | POA: Diagnosis not present

## 2021-06-10 DIAGNOSIS — K580 Irritable bowel syndrome with diarrhea: Secondary | ICD-10-CM | POA: Diagnosis not present

## 2021-06-10 NOTE — Addendum Note (Signed)
Addended by: Jacobo Forest on: 06/10/2021 01:06 PM   Modules accepted: Orders

## 2021-06-11 LAB — GIARDIA AND CRYPTOSPORIDIUM ANTIGEN PANEL: Result:: NOT DETECTED

## 2021-06-12 LAB — CLOSTRIDIUM DIFFICILE EIA: C difficile Toxins A+B, EIA: NEGATIVE

## 2021-06-12 LAB — SPECIMEN STATUS REPORT

## 2021-07-21 DIAGNOSIS — R3915 Urgency of urination: Secondary | ICD-10-CM | POA: Diagnosis not present

## 2021-07-21 DIAGNOSIS — R350 Frequency of micturition: Secondary | ICD-10-CM | POA: Diagnosis not present

## 2021-08-12 ENCOUNTER — Ambulatory Visit: Payer: Medicare Other | Admitting: Internal Medicine

## 2021-08-12 ENCOUNTER — Telehealth: Payer: Self-pay | Admitting: Internal Medicine

## 2021-08-12 MED ORDER — AZITHROMYCIN 250 MG PO TABS: ORAL_TABLET | ORAL | 0 refills | Status: DC

## 2021-08-12 NOTE — Telephone Encounter (Signed)
Patient calling in  Patient is requesting a z-pak  Patient is having sinus infection..sore throat..& a lot of mucus congestion in her chest  Pharmacy  [id number] - [place], [place] - [address] Phone:  [phone]  Fax:  [phone]

## 2021-08-12 NOTE — Telephone Encounter (Signed)
Joe [name] had an appointment this morning that he missed. I will send a prescription in. Thanks

## 2021-08-13 NOTE — Telephone Encounter (Signed)
Notified pt gave MD response.Pt states he tried calling to cancel but he was very sick. Pt has reschedule for 08/26/21.Marland KitchenJohny [name]

## 2021-08-26 ENCOUNTER — Encounter: Payer: Self-pay | Admitting: Internal Medicine

## 2021-08-26 ENCOUNTER — Ambulatory Visit (INDEPENDENT_AMBULATORY_CARE_PROVIDER_SITE_OTHER): Payer: Medicare Other | Admitting: Internal Medicine

## 2021-08-26 ENCOUNTER — Other Ambulatory Visit: Payer: Self-pay

## 2021-08-26 VITALS — BP 134/52 | HR 97 | Temp 97.6°F | Ht 69.0 in | Wt 278.6 lb

## 2021-08-26 DIAGNOSIS — J0100 Acute maxillary sinusitis, unspecified: Secondary | ICD-10-CM | POA: Diagnosis not present

## 2021-08-26 DIAGNOSIS — R531 Weakness: Secondary | ICD-10-CM

## 2021-08-26 DIAGNOSIS — R739 Hyperglycemia, unspecified: Secondary | ICD-10-CM | POA: Diagnosis not present

## 2021-08-26 DIAGNOSIS — N183 Chronic kidney disease, stage 3 unspecified: Secondary | ICD-10-CM | POA: Diagnosis not present

## 2021-08-26 DIAGNOSIS — E538 Deficiency of other specified B group vitamins: Secondary | ICD-10-CM

## 2021-08-26 LAB — COMPREHENSIVE METABOLIC PANEL
ALT: 38 U/L (ref 0–53)
AST: 28 U/L (ref 0–37)
Albumin: 4.2 g/dL (ref 3.5–5.2)
Alkaline Phosphatase: 89 U/L (ref 39–117)
BUN: 47 mg/dL — ABNORMAL HIGH (ref 6–23)
CO2: 23 mEq/L (ref 19–32)
Calcium: 9.6 mg/dL (ref 8.4–10.5)
Chloride: 97 mEq/L (ref 96–112)
Creatinine, Ser: 2.08 mg/dL — ABNORMAL HIGH (ref 0.40–1.50)
GFR: 31.09 mL/min — ABNORMAL LOW (ref >60.00)
Glucose, Bld: 97 mg/dL (ref 70–99)
Potassium: 4.5 mEq/L (ref 3.5–5.1)
Sodium: 132 mEq/L — ABNORMAL LOW (ref 135–145)
Total Bilirubin: 0.7 mg/dL (ref 0.2–1.2)
Total Protein: 8.3 g/dL (ref 6.0–8.3)

## 2021-08-26 LAB — HEMOGLOBIN A1C: Hgb A1c MFr Bld: 6.1 % (ref 4.6–6.5)

## 2021-08-26 MED ORDER — CEFDINIR 300 MG PO CAPS: 300.0000 mg | ORAL_CAPSULE | Freq: Two times a day (BID) | ORAL | 1 refills | Status: DC

## 2021-08-26 NOTE — Assessment & Plan Note (Signed)
Cont on B12 ?

## 2021-08-26 NOTE — Assessment & Plan Note (Signed)
Using a walker 

## 2021-08-26 NOTE — Assessment & Plan Note (Addendum)
Nephrology ref [facility] well

## 2021-08-26 NOTE — Progress Notes (Signed)
Subjective:  Patient ID: Joe [name], male    DOB: [birth year]  Age: 76 y.o. MRN: [id number]  CC: Follow-up (2 MONTH F/U)   HPI Joe [name] presents for URI. Z pack on 12/20 helped for 2 d only. C/o cough, SOB F/u HTN, elev glucose   Outpatient Medications Prior to Visit  Medication Sig Dispense Refill   acetaminophen (TYLENOL) 325 MG tablet Take 2 tablets (650 mg total) by mouth every 6 (six) hours as needed for mild pain (or Fever >/= 101). 30 tablet 0   aspirin 81 MG tablet Take 81 mg by mouth daily.     Cholecalciferol (VITAMIN D) 1000 UNITS capsule Take 1,000 Units by mouth daily. Vitamin d 3 geltab     diazepam (VALIUM) 5 MG tablet Take 1 tablet (5 mg total) by mouth every 12 (twelve) hours as needed for anxiety or muscle spasms. 60 tablet 3   diclofenac sodium (VOLTAREN) 1 % GEL Apply 2 g topically 4 (four) times daily. (Patient taking differently: Apply 2 g topically as needed.) 100 g 3   diphenoxylate-atropine (LOMOTIL) 2.5-0.025 MG tablet Take 1 tablet by mouth 4 (four) times daily as needed for diarrhea or loose stools. 60 tablet 0   fluticasone (FLONASE) 50 MCG/ACT nasal spray Place 2 sprays into both nostrils daily as needed for allergies. 16 g 11   Ipratropium-Albuterol (COMBIVENT RESPIMAT) 20-100 MCG/ACT AERS Inhale 2 Act into the lungs 4 (four) times daily - after meals and at bedtime. (Patient taking differently: Inhale 2 Act into the lungs as needed for shortness of breath.) 1 Inhaler 11   loratadine (CLARITIN) 10 MG tablet Take 1 tablet (10 mg total) by mouth daily. (Patient taking differently: Take 10 mg by mouth daily as needed.) 30 tablet 11   olmesartan (BENICAR) 40 MG tablet TAKE 1 TABLET BY MOUTH EVERY DAY 90 tablet 3   pyridoxine (B-6) 100 MG tablet Take 100 mg by mouth daily.     saccharomyces boulardii (FLORASTOR) 250 MG capsule Take 1 capsule (250 mg total) by mouth 2 (two) times daily. 60 capsule 1   torsemide (DEMADEX) 20 MG tablet TAKE 2- 3 TABLETS  BY MOUTH EVERY DAY 270 tablet 1   vitamin B-12 (CYANOCOBALAMIN) 1000 MCG tablet Take 1,000 mcg by mouth daily. 1  tab po qd     azithromycin (ZITHROMAX Z-PAK) 250 MG tablet As directed (Patient not taking: Reported on 08/26/2021) 6 tablet 0   No facility-administered medications prior to visit.    ROS: Review of Systems  Constitutional:  Positive for fatigue. Negative for appetite change and unexpected weight change.  HENT:  Positive for postnasal drip, sinus pressure and sinus pain. Negative for congestion, nosebleeds, sneezing, sore throat and trouble swallowing.   Eyes:  Negative for itching and visual disturbance.  Respiratory:  Positive for cough and shortness of breath.   Cardiovascular:  Negative for chest pain, palpitations and leg swelling.  Gastrointestinal:  Negative for abdominal distention, blood in stool, diarrhea and nausea.  Genitourinary:  Negative for frequency and hematuria.  Musculoskeletal:  Negative for back pain, gait problem, joint swelling and neck pain.  Skin:  Negative for rash.  Neurological:  Positive for dizziness, weakness and light-headedness. Negative for tremors and speech difficulty.  Hematological:  Does not bruise/bleed easily.  Psychiatric/Behavioral:  Negative for agitation, dysphoric mood, sleep disturbance and suicidal ideas. The patient is not nervous/anxious.    Objective:  BP (!) 134/52 (BP Location: Left Arm)    Pulse 97  Temp 97.6 F (36.4 C) (Oral)    Ht 5\' 9"  (1.753 m)    Wt 278 lb 9.6 oz (126.4 kg)    SpO2 95%    BMI 41.14 kg/m   BP Readings from Last 3 Encounters:  08/26/21 (!) 134/52  06/03/21 (!) 142/62  02/26/21 (!) 142/70    Wt Readings from Last 3 Encounters:  08/26/21 278 lb 9.6 oz (126.4 kg)  06/03/21 279 lb 6.4 oz (126.7 kg)  02/26/21 280 lb (127 kg)    Physical Exam Constitutional:      General: He is not in acute distress.    Appearance: He is well-developed. He is obese.     Comments: NAD  Eyes:      Conjunctiva/sclera: Conjunctivae normal.     Pupils: Pupils are equal, round, and reactive to light.  Neck:     Thyroid: No thyromegaly.     Vascular: No JVD.  Cardiovascular:     Rate and Rhythm: Normal rate and regular rhythm.     Heart sounds: Normal heart sounds. No murmur heard.   No friction rub. No gallop.  Pulmonary:     Effort: Pulmonary effort is normal. No respiratory distress.     Breath sounds: Normal breath sounds. No wheezing or rales.  Chest:     Chest wall: No tenderness.  Abdominal:     General: Bowel sounds are normal. There is no distension.     Palpations: Abdomen is soft. There is no mass.     Tenderness: There is no abdominal tenderness. There is no guarding or rebound.  Musculoskeletal:        General: No tenderness. Normal range of motion.     Cervical back: Normal range of motion.  Lymphadenopathy:     Cervical: No cervical adenopathy.  Skin:    General: Skin is warm and dry.     Findings: No rash.  Neurological:     Mental Status: He is alert and oriented to person, place, and time.     Cranial Nerves: No cranial nerve deficit.     Motor: Weakness present. No abnormal muscle tone.     Coordination: Coordination normal.     Gait: Gait abnormal.     Deep Tendon Reflexes: Reflexes are normal and symmetric.  Psychiatric:        Behavior: Behavior normal.        Thought Content: Thought content normal.        Judgment: Judgment normal.  Tetraparesis Using a walker  Lab Results  Component Value Date   WBC 9.5 06/03/2021   HGB 13.6 06/03/2021   HCT 40.6 06/03/2021   PLT 301.0 06/03/2021   GLUCOSE 97 08/26/2021   CHOL 159 05/09/2020   TRIG 121 05/09/2020   HDL 34 (L) 05/09/2020   LDLDIRECT 152.5 10/04/2009   LDLCALC 103 (H) 05/09/2020   ALT 38 08/26/2021   AST 28 08/26/2021   NA 132 (L) 08/26/2021   K 4.5 08/26/2021   CL 97 08/26/2021   CREATININE 2.08 (H) 08/26/2021   BUN 47 (H) 08/26/2021   CO2 23 08/26/2021   TSH 0.43 06/03/2021   PSA  5.04 (H) 08/08/2019   INR 1.0 07/05/2020   HGBA1C 6.1 08/26/2021    MR LUMBAR SPINE WO CONTRAST  Result Date: 11/23/2020  [facility] [address] [place], [place] [address] [phone] NEUROIMAGING REPORT STUDY DATE: 11/22/2020 PATIENT NAME: TIMITHY ARONS DOB: [birth year] MRN: [id number] EXAM: MRI of the lumbar spine  without contrast ORDERING CLINICIAN: Andrey Spearman MD CLINICAL HISTORY: 76 year old man with leg weakness and spinal stenosis COMPARISON FILMS: None TECHNIQUE: MRI of the lumbar spine was obtained utilizing 4 mm sagittal slices from C62-37 down to the lower sacrum with T1, T2 and inversion recovery views. In addition 4 mm axial slices from S2-8 down to L5-S1 level were included with T1 and T2 weighted views. CONTRAST: None IMAGING SITE: [facility], [address] [place], [place], [place] FINDINGS: On sagittal images, the spine is imaged from T11 to the sacrum.   The conus medullaris and cauda equine appear normal.   The vertebral bodies are normally aligned.  There is moderately severe loss of disc height at L5-S1.  Endplate degenerative changes are noted at L2-L3 and L5-S1. On sagittal images, not covered by axial views, disc protrusions are noted at T10-T11 and T11-T12.  There does not appear to be foraminal narrowing at these levels.  The lumbar discs and interspaces were further evaluated on axial views from T12 to S1 as follows: T12-L1: The disc and interspace appear normal. L1-L2: There is minimal disc bulging and minimal facet hypertrophy.  No significant foraminal narrowing or lateral recess stenosis.  No spinal stenosis or nerve root compression. L2-L3: There is moderately severe spinal stenosis (AP diameter 6.2 mm) due to large broad disc protrusion, mild facet hypertrophy with small joint effusions and minimal ligamenta flava hypertrophy.  These degenerative changes also cause mild bilateral foraminal narrowing and mild to moderate left and mild right  lateral recess stenosis.  There does not appear to be nerve root compression at this level. L3-L4: There is severe spinal stenosis (AP diameter 5.4 mm) due to a left paramedian disc herniation, facet hypertrophy and ligamenta flava hypertrophy.  There is moderately severe bilateral foraminal narrowing, severe left lateral recess stenosis and moderately severe right lateral recess stenosis.  There is probable left L4 nerve root compression and there is also potential for compression of both L3 nerve roots in the right L4 nerve root. L4-L5: There is moderately severe spinal stenosis (AP diameter 6.6 mm) due to poor disc protrusion, moderately severe facet hypertrophy and ligamenta flava hypertrophy.  Additionally, there is moderate left and moderately severe right foraminal narrowing, and moderate left and moderately severe right lateral recess stenosis.  There is potential for right L4 and L5 nerve root compression. L5-S1: There is reduced disc height, disc protrusion, severe facet hypertrophy.  These changes cause moderately severe bilateral foraminal narrowing and mild lateral recess stenosis.  There is potential for L5 nerve root compression.   This MRI of the lumbar spine without contrast shows the following: 1.   Disc protrusions and other mild degenerative changes at T10-T11, T11-T12 and L1-L2.  There is no nerve root compression or significant spinal stenosis at these levels. 2.   At L2-L3, there is moderately severe spinal stenosis (AP diameter 6.2 mm) due to degenerative changes.  There does not appear to be nerve root compression at this level. 3.   At L3-L4, there is severe spinal stenosis (AP diameter 5.4 mm) due to left paramedian disc herniation and other degenerative changes.  There is probable left L4 nerve root compression and there is also potential bilateral L3 and right L4 nerve root compression. 4.   At L4-L5, there is moderately severe spinal stenosis (AP diameter 6.6 mm) due to degenerative  changes.  There is potential for right L4 and right L5 nerve root compression. 5.   At L5-S1, there are degenerative changes causing moderately  severe bilateral foraminal narrowing with potential for L5 nerve root compression to either side. INTERPRETING PHYSICIAN: Richard A. Felecia Shelling, MD, PhD, FAAN Certified in  Neuroimaging by [employer]    Assessment & Plan:   Problem List Items Addressed This Visit     Acute sinusitis - Primary    Start Cefdinir      Relevant Medications   cefdinir (OMNICEF) 300 MG capsule   B12 deficiency    Cont on B12      Chronic renal insufficiency, stage 3 (moderate) ([place])    Nephrology ref [facility] well      Relevant Medications   cefdinir (OMNICEF) 300 MG capsule   Other Relevant Orders   Ambulatory referral to Nephrology   Comprehensive metabolic panel (Completed)   Hemoglobin A1c (Completed)   Weakness    Using a walker      Other Visit Diagnoses     Hyperglycemia       Relevant Orders   Comprehensive metabolic panel (Completed)   Hemoglobin A1c (Completed)         Meds ordered this encounter  Medications   cefdinir (OMNICEF) 300 MG capsule    Sig: Take 1 capsule (300 mg total) by mouth 2 (two) times daily.    Dispense:  20 capsule    Refill:  1      Follow-up: Return in about 3 months (around 11/24/2021) for a follow-up visit.  Walker Kehr, MD

## 2021-08-26 NOTE — Assessment & Plan Note (Signed)
Start Cefdinir 

## 2021-09-22 DIAGNOSIS — I2510 Atherosclerotic heart disease of native coronary artery without angina pectoris: Secondary | ICD-10-CM | POA: Diagnosis not present

## 2021-09-22 DIAGNOSIS — D631 Anemia in chronic kidney disease: Secondary | ICD-10-CM | POA: Diagnosis not present

## 2021-09-22 DIAGNOSIS — N1832 Chronic kidney disease, stage 3b: Secondary | ICD-10-CM | POA: Diagnosis not present

## 2021-09-22 DIAGNOSIS — J449 Chronic obstructive pulmonary disease, unspecified: Secondary | ICD-10-CM | POA: Diagnosis not present

## 2021-09-22 DIAGNOSIS — N189 Chronic kidney disease, unspecified: Secondary | ICD-10-CM | POA: Diagnosis not present

## 2021-09-22 DIAGNOSIS — I129 Hypertensive chronic kidney disease with stage 1 through stage 4 chronic kidney disease, or unspecified chronic kidney disease: Secondary | ICD-10-CM | POA: Diagnosis not present

## 2021-09-22 DIAGNOSIS — I739 Peripheral vascular disease, unspecified: Secondary | ICD-10-CM | POA: Diagnosis not present

## 2021-09-30 ENCOUNTER — Other Ambulatory Visit: Payer: Self-pay | Admitting: Nephrology

## 2021-09-30 DIAGNOSIS — N1832 Chronic kidney disease, stage 3b: Secondary | ICD-10-CM

## 2021-10-02 ENCOUNTER — Ambulatory Visit
Admission: RE | Admit: 2021-10-02 | Discharge: 2021-10-02 | Disposition: A | Discharge status: Home / Self Care | Payer: Medicare Other | Source: Ambulatory Visit | Attending: Nephrology | Admitting: Nephrology

## 2021-10-02 DIAGNOSIS — N281 Cyst of kidney, acquired: Secondary | ICD-10-CM | POA: Diagnosis not present

## 2021-10-02 DIAGNOSIS — N1832 Chronic kidney disease, stage 3b: Secondary | ICD-10-CM

## 2021-11-26 ENCOUNTER — Ambulatory Visit (INDEPENDENT_AMBULATORY_CARE_PROVIDER_SITE_OTHER): Payer: Medicare Other | Admitting: Internal Medicine

## 2021-11-26 ENCOUNTER — Encounter: Payer: Self-pay | Admitting: Internal Medicine

## 2021-11-26 VITALS — BP 150/68 | HR 91 | Temp 98.8°F | Ht 69.0 in | Wt 277.0 lb

## 2021-11-26 DIAGNOSIS — T466X5A Adverse effect of antihyperlipidemic and antiarteriosclerotic drugs, initial encounter: Secondary | ICD-10-CM

## 2021-11-26 DIAGNOSIS — F411 Generalized anxiety disorder: Secondary | ICD-10-CM | POA: Diagnosis not present

## 2021-11-26 DIAGNOSIS — I739 Peripheral vascular disease, unspecified: Secondary | ICD-10-CM

## 2021-11-26 DIAGNOSIS — N2889 Other specified disorders of kidney and ureter: Secondary | ICD-10-CM

## 2021-11-26 DIAGNOSIS — N183 Chronic kidney disease, stage 3 unspecified: Secondary | ICD-10-CM | POA: Diagnosis not present

## 2021-11-26 DIAGNOSIS — M79601 Pain in right arm: Secondary | ICD-10-CM | POA: Diagnosis not present

## 2021-11-26 DIAGNOSIS — G720 Drug-induced myopathy: Secondary | ICD-10-CM

## 2021-11-26 DIAGNOSIS — E559 Vitamin D deficiency, unspecified: Secondary | ICD-10-CM | POA: Diagnosis not present

## 2021-11-26 DIAGNOSIS — R739 Hyperglycemia, unspecified: Secondary | ICD-10-CM | POA: Diagnosis not present

## 2021-11-26 LAB — COMPREHENSIVE METABOLIC PANEL
ALT: 20 U/L (ref 0–53)
AST: 20 U/L (ref 0–37)
Albumin: 4.3 g/dL (ref 3.5–5.2)
Alkaline Phosphatase: 82 U/L (ref 39–117)
BUN: 52 mg/dL — ABNORMAL HIGH (ref 6–23)
CO2: 24 mEq/L (ref 19–32)
Calcium: 9.8 mg/dL (ref 8.4–10.5)
Chloride: 99 mEq/L (ref 96–112)
Creatinine, Ser: 2.31 mg/dL — ABNORMAL HIGH (ref 0.40–1.50)
GFR: 27.36 mL/min — ABNORMAL LOW (ref >60.00)
Glucose, Bld: 95 mg/dL (ref 70–99)
Potassium: 4.4 mEq/L (ref 3.5–5.1)
Sodium: 134 mEq/L — ABNORMAL LOW (ref 135–145)
Total Bilirubin: 0.7 mg/dL (ref 0.2–1.2)
Total Protein: 8 g/dL (ref 6.0–8.3)

## 2021-11-26 LAB — HEMOGLOBIN A1C: Hgb A1c MFr Bld: 6.3 % (ref 4.6–6.5)

## 2021-11-26 MED ORDER — BUDESONIDE-FORMOTEROL FUMARATE 160-4.5 MCG/ACT IN AERO: 2.0000 | INHALATION_SPRAY | Freq: Two times a day (BID) | RESPIRATORY_TRACT | 11 refills | Status: DC

## 2021-11-26 MED ORDER — NIACIN ER 250 MG PO CPCR: 250.0000 mg | ORAL_CAPSULE | Freq: Two times a day (BID) | ORAL | 3 refills | Status: AC

## 2021-11-26 NOTE — Assessment & Plan Note (Signed)
F/u w/Dr Upton 

## 2021-11-26 NOTE — Assessment & Plan Note (Signed)
On Vit D 

## 2021-11-26 NOTE — Assessment & Plan Note (Signed)
Chronic  ?Alprazolam prn ? Potential benefits of a long term benzodiazepines  use as well as potential risks  and complications were explained to the patient and were aknowledged. ?

## 2021-11-26 NOTE — Assessment & Plan Note (Addendum)
Worse ?Blue-Emu cream was recommended to use 2-3 times a day ? ?

## 2021-11-26 NOTE — Assessment & Plan Note (Signed)
Wt Readings from Last 3 Encounters:  ?11/26/21 277 lb (125.6 kg)  ?08/26/21 278 lb 9.6 oz (126.4 kg)  ?06/03/21 279 lb 6.4 oz (126.7 kg)  ? ? ?

## 2021-11-26 NOTE — Assessment & Plan Note (Signed)
Options discussed ?Walk more ?Loose wt ?Will try niacin CR ?

## 2021-11-26 NOTE — Patient Instructions (Signed)
Blue-Emu cream was recommended to use 2-3 times a day ? ?

## 2021-11-26 NOTE — Progress Notes (Signed)
? ?Subjective:  ?Patient ID: Joe [name], male    DOB: 28-Mar-1946  Age: 76 y.o. MRN: [id number] ? ?CC: No chief complaint on file. ? ? ?HPI ?Joe [name] presents for PVD, asthma, edema ?C/o arthralgias ?C/o poor circulation ? ?Outpatient Medications Prior to Visit  ?Medication Sig Dispense Refill  ?? acetaminophen (TYLENOL) 325 MG tablet Take 2 tablets (650 mg total) by mouth every 6 (six) hours as needed for mild pain (or Fever >/= 101). 30 tablet 0  ?? aspirin 81 MG tablet Take 81 mg by mouth daily.    ?? cefdinir (OMNICEF) 300 MG capsule Take 1 capsule (300 mg total) by mouth 2 (two) times daily. 20 capsule 1  ?? Cholecalciferol (VITAMIN D) 1000 UNITS capsule Take 1,000 Units by mouth daily. Vitamin d 3 geltab    ?? diazepam (VALIUM) 5 MG tablet Take 1 tablet (5 mg total) by mouth every 12 (twelve) hours as needed for anxiety or muscle spasms. 60 tablet 3  ?? diclofenac sodium (VOLTAREN) 1 % GEL Apply 2 g topically 4 (four) times daily. (Patient taking differently: Apply 2 g topically as needed.) 100 g 3  ?? diphenoxylate-atropine (LOMOTIL) 2.5-0.025 MG tablet Take 1 tablet by mouth 4 (four) times daily as needed for diarrhea or loose stools. 60 tablet 0  ?? fluticasone (FLONASE) 50 MCG/ACT nasal spray Place 2 sprays into both nostrils daily as needed for allergies. 16 g 11  ?? Ipratropium-Albuterol (COMBIVENT RESPIMAT) 20-100 MCG/ACT AERS Inhale 2 Act into the lungs 4 (four) times daily - after meals and at bedtime. (Patient taking differently: Inhale 2 Act into the lungs as needed for shortness of breath.) 1 Inhaler 11  ?? loratadine (CLARITIN) 10 MG tablet Take 1 tablet (10 mg total) by mouth daily. (Patient taking differently: Take 10 mg by mouth daily as needed.) 30 tablet 11  ?? olmesartan (BENICAR) 40 MG tablet TAKE 1 TABLET BY MOUTH EVERY DAY 90 tablet 3  ?? pyridoxine (B-6) 100 MG tablet Take 100 mg by mouth daily.    ?? saccharomyces boulardii (FLORASTOR) 250 MG capsule Take 1 capsule (250 mg  total) by mouth 2 (two) times daily. 60 capsule 1  ?? torsemide (DEMADEX) 20 MG tablet TAKE 2- 3 TABLETS BY MOUTH EVERY DAY 270 tablet 1  ?? vitamin B-12 (CYANOCOBALAMIN) 1000 MCG tablet Take 1,000 mcg by mouth daily. 1  tab po qd    ? ?No facility-administered medications prior to visit.  ? ? ?ROS: ?Review of Systems  ?Constitutional:  Positive for fatigue. Negative for appetite change and unexpected weight change.  ?HENT:  Negative for congestion, nosebleeds, sneezing, sore throat and trouble swallowing.   ?Eyes:  Negative for itching and visual disturbance.  ?Respiratory:  Negative for cough.   ?Cardiovascular:  Negative for chest pain, palpitations and leg swelling.  ?Gastrointestinal:  Negative for abdominal distention, blood in stool, diarrhea and nausea.  ?Genitourinary:  Positive for urgency. Negative for frequency and hematuria.  ?Musculoskeletal:  Positive for arthralgias, back pain and gait problem. Negative for joint swelling and neck pain.  ?Skin:  Negative for rash.  ?Neurological:  Negative for dizziness, tremors, speech difficulty and weakness.  ?Psychiatric/Behavioral:  Negative for agitation, decreased concentration, dysphoric mood and sleep disturbance. The patient is not nervous/anxious.   ? ?Objective:  ?BP (!) 150/68 (BP Location: Left Arm, Patient Position: Sitting, Cuff Size: Large)   Pulse 91   Temp 98.8 ?F (37.1 ?C) (Oral)   Ht '5\' 9"'$  (1.753 m)   Wt  277 lb (125.6 kg)   SpO2 94%   BMI 40.91 kg/m?  ? ?BP Readings from Last 3 Encounters:  ?11/26/21 (!) 150/68  ?08/26/21 (!) 134/52  ?06/03/21 (!) 142/62  ? ? ?Wt Readings from Last 3 Encounters:  ?11/26/21 277 lb (125.6 kg)  ?08/26/21 278 lb 9.6 oz (126.4 kg)  ?06/03/21 279 lb 6.4 oz (126.7 kg)  ? ? ?Physical Exam ?Constitutional:   ?   General: He is not in acute distress. ?   Appearance: He is well-developed.  ?   Comments: NAD  ?Eyes:  ?   Conjunctiva/sclera: Conjunctivae normal.  ?   Pupils: Pupils are equal, round, and reactive to  light.  ?Neck:  ?   Thyroid: No thyromegaly.  ?   Vascular: No JVD.  ?Cardiovascular:  ?   Rate and Rhythm: Normal rate and regular rhythm.  ?   Heart sounds: Normal heart sounds. No murmur heard. ?  No friction rub. No gallop.  ?Pulmonary:  ?   Effort: Pulmonary effort is normal. No respiratory distress.  ?   Breath sounds: Normal breath sounds. No wheezing or rales.  ?Chest:  ?   Chest wall: No tenderness.  ?Abdominal:  ?   General: Bowel sounds are normal. There is no distension.  ?   Palpations: Abdomen is soft. There is no mass.  ?   Tenderness: There is no abdominal tenderness. There is no guarding or rebound.  ?Musculoskeletal:     ?   General: No tenderness. Normal range of motion.  ?   Cervical back: Normal range of motion.  ?Lymphadenopathy:  ?   Cervical: No cervical adenopathy.  ?Skin: ?   General: Skin is warm and dry.  ?   Findings: No rash.  ?Neurological:  ?   Mental Status: He is alert and oriented to person, place, and time.  ?   Cranial Nerves: No cranial nerve deficit.  ?   Motor: No abnormal muscle tone.  ?   Coordination: Coordination normal.  ?   Gait: Gait abnormal.  ?   Deep Tendon Reflexes: Reflexes are normal and symmetric.  ?Psychiatric:     ?   Behavior: Behavior normal.     ?   Thought Content: Thought content normal.     ?   Judgment: Judgment normal.  ? ? ?Lab Results  ?Component Value Date  ? WBC 9.5 06/03/2021  ? HGB 13.6 06/03/2021  ? HCT 40.6 06/03/2021  ? PLT 301.0 06/03/2021  ? GLUCOSE 97 08/26/2021  ? CHOL 159 05/09/2020  ? TRIG 121 05/09/2020  ? HDL 34 (L) 05/09/2020  ? LDLDIRECT 152.5 10/04/2009  ? LDLCALC 103 (H) 05/09/2020  ? ALT 38 08/26/2021  ? AST 28 08/26/2021  ? NA 132 (L) 08/26/2021  ? K 4.5 08/26/2021  ? CL 97 08/26/2021  ? CREATININE 2.08 (H) 08/26/2021  ? BUN 47 (H) 08/26/2021  ? CO2 23 08/26/2021  ? TSH 0.43 06/03/2021  ? PSA 5.04 (H) 08/08/2019  ? INR 1.0 07/05/2020  ? HGBA1C 6.1 08/26/2021  ? ? ?US RENAL ? ?Result Date: 10/04/2021 ?CLINICAL DATA:  Stage III B  chronic kidney disease. EXAM: RENAL / URINARY TRACT ULTRASOUND COMPLETE COMPARISON:  PET CT 02/29/2020 reviewed. FINDINGS: Right Kidney: Renal measurements: 10.9 x 5.2 x 5.4 cm = volume: 162 mL. Thinning of renal parenchyma with borderline increased parenchymal echogenicity. No hydronephrosis. No focal lesion or stone. Left Kidney: Renal measurements: 11.6 x 5.9 x 5.8 cm = volume: 202 mL.  Mild thinning of renal parenchyma with borderline increased renal parenchymal echogenicity. No hydronephrosis. 1.1 cm cyst in the mid-lower kidney peripherally. No solid lesion. No stone. Bladder: Appears normal for degree of bladder distention. Other: None. IMPRESSION: 1. Thinning of renal parenchyma with borderline increased parenchymal echogenicity typical of chronic medical renal disease. 2. Small incidental left renal cyst. Electronically Signed   By: Keith Rake M.D.   On: 10/04/2021 11:31  ? ? ?Assessment & Plan:  ? ?Problem List Items Addressed This Visit   ? ? Vitamin D deficiency  ?  On Vit D ?  ?  ? Anxiety state  ?  Chronic  ?Alprazolam prn ? Potential benefits of a long term benzodiazepines  use as well as potential risks  and complications were explained to the patient and were aknowledged. ?  ?  ? PVD (peripheral vascular disease) ([place])  ?  Options discussed ?Walk more ?Loose wt ?Will try niacin CR ?  ?  ? Pain in limb  ?  Worse ?Blue-Emu cream was recommended to use 2-3 times a day ? ?  ?  ? Morbid obesity ([place])  ?  Wt Readings from Last 3 Encounters:  ?11/26/21 277 lb (125.6 kg)  ?08/26/21 278 lb 9.6 oz (126.4 kg)  ?06/03/21 279 lb 6.4 oz (126.7 kg)  ? ?  ?  ? Chronic renal insufficiency, stage 3 (moderate) ([facility])  ?  F/u w/Dr Hollie Salk ?  ?  ? Relevant Orders  ? Hemoglobin A1c  ? Comprehensive metabolic panel  ? ?Other Visit Diagnoses   ? ? Hyperglycemia    -  Primary  ? Relevant Orders  ? Hemoglobin A1c  ? ?  ?  ? ? ?Meds ordered this encounter  ?Medications  ?? niacin 250 MG CR capsule  ?  Sig: Take 1 capsule  (250 mg total) by mouth 2 (two) times daily with a meal.  ?  Dispense:  100 capsule  ?  Refill:  3  ?? budesonide-formoterol (SYMBICORT) 160-4.5 MCG/ACT inhaler  ?  Sig: Inhale 2 puffs into the lungs 2 (two) times d

## 2021-11-27 ENCOUNTER — Telehealth: Payer: Self-pay

## 2021-11-27 ENCOUNTER — Other Ambulatory Visit: Payer: Self-pay

## 2021-11-27 DIAGNOSIS — J209 Acute bronchitis, unspecified: Secondary | ICD-10-CM

## 2021-11-27 MED ORDER — BUDESONIDE-FORMOTEROL FUMARATE 160-4.5 MCG/ACT IN AERO: 2.0000 | INHALATION_SPRAY | Freq: Two times a day (BID) | RESPIRATORY_TRACT | 11 refills | Status: AC

## 2021-11-27 NOTE — Telephone Encounter (Signed)
Pt calling after going to pharmacy to see if the Rx for budesonide-formoterol (SYMBICORT) 160-4.5 MCG/ACT inhaler can be resent to [facility] [address]. ? ?I called and verified the pharmacy staff confirmed they didn't receive it. ? ?Please advise ?

## 2021-12-12 NOTE — Assessment & Plan Note (Signed)
He can't take statins. ?Other treatments for hyperlipidemia were discussed  ?

## 2022-01-12 DIAGNOSIS — E349 Endocrine disorder, unspecified: Secondary | ICD-10-CM | POA: Diagnosis not present

## 2022-01-12 DIAGNOSIS — R350 Frequency of micturition: Secondary | ICD-10-CM | POA: Diagnosis not present

## 2022-01-12 DIAGNOSIS — R3915 Urgency of urination: Secondary | ICD-10-CM | POA: Diagnosis not present

## 2022-03-11 ENCOUNTER — Ambulatory Visit (INDEPENDENT_AMBULATORY_CARE_PROVIDER_SITE_OTHER): Payer: Medicare Other | Admitting: Internal Medicine

## 2022-03-11 ENCOUNTER — Encounter: Payer: Self-pay | Admitting: Internal Medicine

## 2022-03-11 ENCOUNTER — Ambulatory Visit (INDEPENDENT_AMBULATORY_CARE_PROVIDER_SITE_OTHER): Payer: Medicare Other

## 2022-03-11 VITALS — BP 110/60 | HR 97 | Temp 97.0°F | Ht 69.0 in | Wt 270.6 lb

## 2022-03-11 VITALS — BP 110/60 | HR 97 | Temp 97.0°F | Ht 69.0 in | Wt 270.0 lb

## 2022-03-11 DIAGNOSIS — E538 Deficiency of other specified B group vitamins: Secondary | ICD-10-CM

## 2022-03-11 DIAGNOSIS — M62838 Other muscle spasm: Secondary | ICD-10-CM

## 2022-03-11 DIAGNOSIS — I10 Essential (primary) hypertension: Secondary | ICD-10-CM

## 2022-03-11 DIAGNOSIS — Z0000 Encounter for general adult medical examination without abnormal findings: Secondary | ICD-10-CM | POA: Diagnosis not present

## 2022-03-11 DIAGNOSIS — R739 Hyperglycemia, unspecified: Secondary | ICD-10-CM

## 2022-03-11 DIAGNOSIS — M79604 Pain in right leg: Secondary | ICD-10-CM

## 2022-03-11 DIAGNOSIS — I2510 Atherosclerotic heart disease of native coronary artery without angina pectoris: Secondary | ICD-10-CM | POA: Diagnosis not present

## 2022-03-11 DIAGNOSIS — N183 Chronic kidney disease, stage 3 unspecified: Secondary | ICD-10-CM | POA: Diagnosis not present

## 2022-03-11 DIAGNOSIS — M79605 Pain in left leg: Secondary | ICD-10-CM | POA: Diagnosis not present

## 2022-03-11 MED ORDER — CEFDINIR 300 MG PO CAPS: 300.0000 mg | ORAL_CAPSULE | Freq: Two times a day (BID) | ORAL | 1 refills | Status: AC

## 2022-03-11 MED ORDER — DIAZEPAM 5 MG PO TABS: 5.0000 mg | ORAL_TABLET | Freq: Two times a day (BID) | ORAL | 3 refills | Status: AC | PRN

## 2022-03-11 NOTE — Assessment & Plan Note (Signed)
On B12 

## 2022-03-11 NOTE — Assessment & Plan Note (Signed)
Continue aspirin. Intolerant to statins. Chronic. No angina

## 2022-03-11 NOTE — Assessment & Plan Note (Signed)
Hydrate well 

## 2022-03-11 NOTE — Assessment & Plan Note (Signed)
Benicar and Torsemide 20 mg/d

## 2022-03-11 NOTE — Assessment & Plan Note (Signed)
Wt Readings from Last 3 Encounters:  03/11/22 270 lb (122.5 kg)  03/11/22 270 lb 9.6 oz (122.7 kg)  11/26/21 277 lb (125.6 kg)

## 2022-03-11 NOTE — Patient Instructions (Signed)
[name] , Thank you for taking time to come for your Medicare Wellness Visit. I appreciate your ongoing commitment to your health goals. Please review the following plan we discussed and let me know if I can assist you in the future.   Screening recommendations/referrals: Colonoscopy: Discontinued Recommended yearly ophthalmology/optometry visit for glaucoma screening and checkup Recommended yearly dental visit for hygiene and checkup  Vaccinations: Influenza vaccine: declined Pneumococcal vaccine: 06/25/2015, 02/01/2017 Tdap vaccine: declined Shingles vaccine: declined   Covid-19: declined  Advanced directives: No  Conditions/risks identified: Yes  Next appointment: Please schedule your next Medicare Wellness Visit with your Nurse Health Advisor in 1 year by calling [phone].  Preventive Care 74 Years and Older, Male Preventive care refers to lifestyle choices and visits with your health care provider that can promote health and wellness. What does preventive care include? A yearly physical exam. This is also called an annual well check. Dental exams once or twice a year. Routine eye exams. Ask your health care provider how often you should have your eyes checked. Personal lifestyle choices, including: Daily care of your teeth and gums. Regular physical activity. Eating a healthy diet. Avoiding tobacco and drug use. Limiting alcohol use. Practicing safe sex. Taking low doses of aspirin every day. Taking vitamin and mineral supplements as recommended by your health care provider. What happens during an annual well check? The services and screenings done by your health care provider during your annual well check will depend on your age, overall health, lifestyle risk factors, and family history of disease. Counseling  Your health care provider may ask you questions about your: Alcohol use. Tobacco use. Drug use. Emotional well-being. Home and relationship  well-being. Sexual activity. Eating habits. History of falls. Memory and ability to understand (cognition). Work and work Statistician. Screening  You may have the following tests or measurements: Height, weight, and BMI. Blood pressure. Lipid and cholesterol levels. These may be checked every 5 years, or more frequently if you are over 74 years old. Skin check. Lung cancer screening. You may have this screening every year starting at age 74 if you have a 30-pack-year history of smoking and currently smoke or have quit within the past 15 years. Fecal occult blood test (FOBT) of the stool. You may have this test every year starting at age 74. Flexible sigmoidoscopy or colonoscopy. You may have a sigmoidoscopy every 5 years or a colonoscopy every 10 years starting at age 74. Prostate cancer screening. Recommendations will vary depending on your family history and other risks. Hepatitis C blood test. Hepatitis B blood test. Sexually transmitted disease (STD) testing. Diabetes screening. This is done by checking your blood sugar (glucose) after you have not eaten for a while (fasting). You may have this done every 1-3 years. Abdominal aortic aneurysm (AAA) screening. You may need this if you are a current or former smoker. Osteoporosis. You may be screened starting at age 19 if you are at high risk. Talk with your health care provider about your test results, treatment options, and if necessary, the need for more tests. Vaccines  Your health care provider may recommend certain vaccines, such as: Influenza vaccine. This is recommended every year. Tetanus, diphtheria, and acellular pertussis (Tdap, Td) vaccine. You may need a Td booster every 10 years. Zoster vaccine. You may need this after age 74. Pneumococcal 13-valent conjugate (PCV13) vaccine. One dose is recommended after age 74. Pneumococcal polysaccharide (PPSV23) vaccine. One dose is recommended after age 74. Talk to your health  care  provider about which screenings and vaccines you need and how often you need them. This information is not intended to replace advice given to you by your health care provider. Make sure you discuss any questions you have with your health care provider. Document Released: 09/06/2015 Document Revised: 04/29/2016 Document Reviewed: 06/11/2015 Elsevier Interactive Patient Education  2017 [place] Prevention in the Home Falls can cause injuries. They can happen to people of all ages. There are many things you can do to make your home safe and to help prevent falls. What can I do on the outside of my home? Regularly fix the edges of walkways and driveways and fix any cracks. Remove anything that might make you trip as you walk through a door, such as a raised step or threshold. Trim any bushes or trees on the path to your home. Use bright outdoor lighting. Clear any walking paths of anything that might make someone trip, such as rocks or tools. Regularly check to see if handrails are loose or broken. Make sure that both sides of any steps have handrails. Any raised decks and porches should have guardrails on the edges. Have any leaves, snow, or ice cleared regularly. Use sand or salt on walking paths during winter. Clean up any spills in your garage right away. This includes oil or grease spills. What can I do in the bathroom? Use night lights. Install grab bars by the toilet and in the tub and shower. Do not use towel bars as grab bars. Use non-skid mats or decals in the tub or shower. If you need to sit down in the shower, use a plastic, non-slip stool. Keep the floor dry. Clean up any water that spills on the floor as soon as it happens. Remove soap buildup in the tub or shower regularly. Attach bath mats securely with double-sided non-slip rug tape. Do not have throw rugs and other things on the floor that can make you trip. What can I do in the bedroom? Use night lights. Make  sure that you have a light by your bed that is easy to reach. Do not use any sheets or blankets that are too big for your bed. They should not hang down onto the floor. Have a firm chair that has side arms. You can use this for support while you get dressed. Do not have throw rugs and other things on the floor that can make you trip. What can I do in the kitchen? Clean up any spills right away. Avoid walking on wet floors. Keep items that you use a lot in easy-to-reach places. If you need to reach something above you, use a strong step stool that has a grab bar. Keep electrical cords out of the way. Do not use floor polish or wax that makes floors slippery. If you must use wax, use non-skid floor wax. Do not have throw rugs and other things on the floor that can make you trip. What can I do with my stairs? Do not leave any items on the stairs. Make sure that there are handrails on both sides of the stairs and use them. Fix handrails that are broken or loose. Make sure that handrails are as long as the stairways. Check any carpeting to make sure that it is firmly attached to the stairs. Fix any carpet that is loose or worn. Avoid having throw rugs at the top or bottom of the stairs. If you do have throw rugs, attach them to  the floor with carpet tape. Make sure that you have a light switch at the top of the stairs and the bottom of the stairs. If you do not have them, ask someone to add them for you. What else can I do to help prevent falls? Wear shoes that: Do not have high heels. Have rubber bottoms. Are comfortable and fit you well. Are closed at the toe. Do not wear sandals. If you use a stepladder: Make sure that it is fully opened. Do not climb a closed stepladder. Make sure that both sides of the stepladder are locked into place. Ask someone to hold it for you, if possible. Clearly mark and make sure that you can see: Any grab bars or handrails. First and last steps. Where the  edge of each step is. Use tools that help you move around (mobility aids) if they are needed. These include: Canes. Walkers. Scooters. Crutches. Turn on the lights when you go into a dark area. Replace any light bulbs as soon as they burn out. Set up your furniture so you have a clear path. Avoid moving your furniture around. If any of your floors are uneven, fix them. If there are any pets around you, be aware of where they are. Review your medicines with your doctor. Some medicines can make you feel dizzy. This can increase your chance of falling. Ask your doctor what other things that you can do to help prevent falls. This information is not intended to replace advice given to you by your health care provider. Make sure you discuss any questions you have with your health care provider. Document Released: 06/06/2009 Document Revised: 01/16/2016 Document Reviewed: 09/14/2014 Elsevier Interactive Patient Education  2017 [employer].

## 2022-03-11 NOTE — Progress Notes (Signed)
Subjective:  Patient ID: Joe [name], male    DOB: Nov 28, 1945  Age: 76 y.o. MRN: [id number]  CC: No chief complaint on file.   HPI Joe [name] presents for OA, allergies, neck stiffness C/o sinusitis sx's  Outpatient Medications Prior to Visit  Medication Sig Dispense Refill   acetaminophen (TYLENOL) 325 MG tablet Take 2 tablets (650 mg total) by mouth every 6 (six) hours as needed for mild pain (or Fever >/= 101). 30 tablet 0   aspirin 81 MG tablet Take 81 mg by mouth daily.     budesonide-formoterol (SYMBICORT) 160-4.5 MCG/ACT inhaler Inhale 2 puffs into the lungs 2 (two) times daily. 1 each 11   Cholecalciferol (VITAMIN D) 1000 UNITS capsule Take 1,000 Units by mouth daily. Vitamin d 3 geltab     diclofenac sodium (VOLTAREN) 1 % GEL Apply 2 g topically 4 (four) times daily. (Patient taking differently: Apply 2 g topically as needed.) 100 g 3   diphenoxylate-atropine (LOMOTIL) 2.5-0.025 MG tablet Take 1 tablet by mouth 4 (four) times daily as needed for diarrhea or loose stools. 60 tablet 0   fluticasone (FLONASE) 50 MCG/ACT nasal spray Place 2 sprays into both nostrils daily as needed for allergies. 16 g 11   Ipratropium-Albuterol (COMBIVENT RESPIMAT) 20-100 MCG/ACT AERS Inhale 2 Act into the lungs 4 (four) times daily - after meals and at bedtime. (Patient taking differently: Inhale 2 Act into the lungs as needed for shortness of breath.) 1 Inhaler 11   loratadine (CLARITIN) 10 MG tablet Take 1 tablet (10 mg total) by mouth daily. (Patient taking differently: Take 10 mg by mouth daily as needed.) 30 tablet 11   niacin 250 MG CR capsule Take 1 capsule (250 mg total) by mouth 2 (two) times daily with a meal. 100 capsule 3   olmesartan (BENICAR) 40 MG tablet TAKE 1 TABLET BY MOUTH EVERY DAY 90 tablet 3   pyridoxine (B-6) 100 MG tablet Take 100 mg by mouth daily.     saccharomyces boulardii (FLORASTOR) 250 MG capsule Take 1 capsule (250 mg total) by mouth 2 (two) times daily. 60  capsule 1   torsemide (DEMADEX) 20 MG tablet TAKE 2- 3 TABLETS BY MOUTH EVERY DAY 270 tablet 1   vitamin B-12 (CYANOCOBALAMIN) 1000 MCG tablet Take 1,000 mcg by mouth daily. 1  tab po qd     cefdinir (OMNICEF) 300 MG capsule Take 1 capsule (300 mg total) by mouth 2 (two) times daily. 20 capsule 1   diazepam (VALIUM) 5 MG tablet Take 1 tablet (5 mg total) by mouth every 12 (twelve) hours as needed for anxiety or muscle spasms. 60 tablet 3   No facility-administered medications prior to visit.    ROS: Review of Systems  Constitutional:  Positive for fatigue. Negative for appetite change, fever and unexpected weight change.  HENT:  Positive for congestion, postnasal drip, rhinorrhea and sinus pain. Negative for nosebleeds, sneezing, sore throat and trouble swallowing.   Eyes:  Negative for itching and visual disturbance.  Respiratory:  Positive for shortness of breath. Negative for cough.   Cardiovascular:  Positive for leg swelling. Negative for chest pain and palpitations.  Gastrointestinal:  Negative for abdominal distention, blood in stool, diarrhea and nausea.  Genitourinary:  Negative for frequency and hematuria.  Musculoskeletal:  Positive for arthralgias, back pain, gait problem, neck pain and neck stiffness. Negative for joint swelling.  Skin:  Negative for rash.  Neurological:  Positive for dizziness and weakness. Negative for tremors  and speech difficulty.  Psychiatric/Behavioral:  Positive for dysphoric mood. Negative for agitation, sleep disturbance and suicidal ideas. The patient is not nervous/anxious.     Objective:  BP 110/60 (BP Location: Left Arm, Patient Position: Sitting, Cuff Size: Normal)   Pulse 97   Temp (!) 97 F (36.1 C) (Oral)   Ht '5\' 9"'$  (1.753 m)   Wt 270 lb (122.5 kg)   SpO2 96%   BMI 39.87 kg/m   BP Readings from Last 3 Encounters:  03/11/22 110/60  03/11/22 110/60  11/26/21 (!) 150/68    Wt Readings from Last 3 Encounters:  03/11/22 270 lb (122.5  kg)  03/11/22 270 lb 9.6 oz (122.7 kg)  11/26/21 277 lb (125.6 kg)    Physical Exam Constitutional:      General: He is not in acute distress.    Appearance: He is well-developed. He is obese.     Comments: NAD  Eyes:     Conjunctiva/sclera: Conjunctivae normal.     Pupils: Pupils are equal, round, and reactive to light.  Neck:     Thyroid: No thyromegaly.     Vascular: No JVD.  Cardiovascular:     Rate and Rhythm: Normal rate and regular rhythm.     Heart sounds: Normal heart sounds. No murmur heard.    No friction rub. No gallop.  Pulmonary:     Effort: Pulmonary effort is normal. No respiratory distress.     Breath sounds: Normal breath sounds. No wheezing or rales.  Chest:     Chest wall: No tenderness.  Abdominal:     General: Bowel sounds are normal. There is no distension.     Palpations: Abdomen is soft. There is no mass.     Tenderness: There is no abdominal tenderness. There is no guarding or rebound.  Musculoskeletal:        General: Tenderness present. Normal range of motion.     Cervical back: Normal range of motion.  Lymphadenopathy:     Cervical: No cervical adenopathy.  Skin:    General: Skin is warm and dry.     Findings: No rash.  Neurological:     Mental Status: He is alert and oriented to person, place, and time.     Cranial Nerves: No cranial nerve deficit.     Motor: No abnormal muscle tone.     Coordination: Coordination normal.     Gait: Gait normal.     Deep Tendon Reflexes: Reflexes are normal and symmetric.  Psychiatric:        Behavior: Behavior normal.        Thought Content: Thought content normal.        Judgment: Judgment normal.   Neck, LS stiff/pain Using a walker Trace edema B LEs Swollen nasal mucosa Dry skin  Lab Results  Component Value Date   WBC 9.5 06/03/2021   HGB 13.6 06/03/2021   HCT 40.6 06/03/2021   PLT 301.0 06/03/2021   GLUCOSE 95 11/26/2021   CHOL 159 05/09/2020   TRIG 121 05/09/2020   HDL 34 (L)  05/09/2020   LDLDIRECT 152.5 10/04/2009   LDLCALC 103 (H) 05/09/2020   ALT 20 11/26/2021   AST 20 11/26/2021   NA 134 (L) 11/26/2021   K 4.4 11/26/2021   CL 99 11/26/2021   CREATININE 2.31 (H) 11/26/2021   BUN 52 (H) 11/26/2021   CO2 24 11/26/2021   TSH 0.43 06/03/2021   PSA 5.04 (H) 08/08/2019   INR 1.0 07/05/2020  HGBA1C 6.3 11/26/2021    US RENAL  Result Date: 10/04/2021 CLINICAL DATA:  Stage III B chronic kidney disease. EXAM: RENAL / URINARY TRACT ULTRASOUND COMPLETE COMPARISON:  PET CT 02/29/2020 reviewed. FINDINGS: Right Kidney: Renal measurements: 10.9 x 5.2 x 5.4 cm = volume: 162 mL. Thinning of renal parenchyma with borderline increased parenchymal echogenicity. No hydronephrosis. No focal lesion or stone. Left Kidney: Renal measurements: 11.6 x 5.9 x 5.8 cm = volume: 202 mL. Mild thinning of renal parenchyma with borderline increased renal parenchymal echogenicity. No hydronephrosis. 1.1 cm cyst in the mid-lower kidney peripherally. No solid lesion. No stone. Bladder: Appears normal for degree of bladder distention. Other: None. IMPRESSION: 1. Thinning of renal parenchyma with borderline increased parenchymal echogenicity typical of chronic medical renal disease. 2. Small incidental left renal cyst. Electronically Signed   By: Keith Rake M.D.   On: 10/04/2021 11:31    Assessment & Plan:   Problem List Items Addressed This Visit     B12 deficiency    On B12      Chronic renal insufficiency, stage 3 (moderate) (HCC)    Hydrate well      Relevant Medications   cefdinir (OMNICEF) 300 MG capsule   Other Relevant Orders   Comprehensive metabolic panel   Hemoglobin A1c   Coronary atherosclerosis    Continue aspirin. Intolerant to statins. Chronic. No angina      Relevant Orders   Comprehensive metabolic panel   Hemoglobin A1c   Essential hypertension    Benicar and Torsemide 20 mg/d      Leg pain, bilateral    Make a rice sock heating pad Blue-Emu cream  was recommended to use 2-3 times a day      Morbid obesity ([place])    Wt Readings from Last 3 Encounters:  03/11/22 270 lb (122.5 kg)  03/11/22 270 lb 9.6 oz (122.7 kg)  11/26/21 277 lb (125.6 kg)        Spasm of muscle    Worse. Spine muscles Start Dizepam prn  Potential benefits of a short/long term benzodiazepines  use as well as potential risks  and complications were explained to the patient and were aknowledged. Make a rice sock heating pad Blue-Emu cream was recommended to use 2-3 times a day       Other Visit Diagnoses     Hyperglycemia    -  Primary   Relevant Orders   Comprehensive metabolic panel   Hemoglobin A1c         Meds ordered this encounter  Medications   diazepam (VALIUM) 5 MG tablet    Sig: Take 1 tablet (5 mg total) by mouth every 12 (twelve) hours as needed for anxiety or muscle spasms.    Dispense:  60 tablet    Refill:  3   cefdinir (OMNICEF) 300 MG capsule    Sig: Take 1 capsule (300 mg total) by mouth 2 (two) times daily.    Dispense:  20 capsule    Refill:  1      Follow-up: Return in about 3 months (around 06/11/2022) for a follow-up visit.  Walker Kehr, MD

## 2022-03-11 NOTE — Patient Instructions (Addendum)
Make a rice sock heating pad  Blue-Emu cream --use 2-3 times a day
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
## 2022-03-11 NOTE — Assessment & Plan Note (Addendum)
Worse. Spine muscles Start Dizepam prn  Potential benefits of a short/long term benzodiazepines  use as well as potential risks  and complications were explained to the patient and were aknowledged. Make a rice sock heating pad Blue-Emu cream was recommended to use 2-3 times a day

## 2022-03-11 NOTE — Assessment & Plan Note (Signed)
Make a rice sock heating pad Blue-Emu cream was recommended to use 2-3 times a day

## 2022-04-02 ENCOUNTER — Other Ambulatory Visit: Payer: Self-pay | Admitting: Internal Medicine

## 2022-04-17 ENCOUNTER — Other Ambulatory Visit: Payer: Self-pay | Admitting: Internal Medicine

## 2022-04-24 DEATH — deceased

## 2022-06-11 ENCOUNTER — Ambulatory Visit: Payer: Medicare Other | Admitting: Internal Medicine
